# Patient Record
Sex: Female | Born: 1955 | Race: Black or African American | Hispanic: No | Marital: Married | Smoking: Never smoker
Health system: Southern US, Community
[De-identification: ages and names within clinical notes are randomized; demographics above are authoritative.]

## PROBLEM LIST (undated history)

## (undated) DIAGNOSIS — F3289 Other specified depressive episodes: Secondary | ICD-10-CM

## (undated) DIAGNOSIS — G43009 Migraine without aura, not intractable, without status migrainosus: Secondary | ICD-10-CM

## (undated) DIAGNOSIS — T7840XA Allergy, unspecified, initial encounter: Secondary | ICD-10-CM

## (undated) DIAGNOSIS — E785 Hyperlipidemia, unspecified: Secondary | ICD-10-CM

## (undated) DIAGNOSIS — D509 Iron deficiency anemia, unspecified: Secondary | ICD-10-CM

## (undated) DIAGNOSIS — I10 Essential (primary) hypertension: Secondary | ICD-10-CM

## (undated) DIAGNOSIS — F329 Major depressive disorder, single episode, unspecified: Secondary | ICD-10-CM

## (undated) DIAGNOSIS — K828 Other specified diseases of gallbladder: Secondary | ICD-10-CM

## (undated) DIAGNOSIS — E119 Type 2 diabetes mellitus without complications: Secondary | ICD-10-CM

## (undated) DIAGNOSIS — G2581 Restless legs syndrome: Secondary | ICD-10-CM

## (undated) DIAGNOSIS — F411 Generalized anxiety disorder: Secondary | ICD-10-CM

## (undated) DIAGNOSIS — K219 Gastro-esophageal reflux disease without esophagitis: Secondary | ICD-10-CM

## (undated) DIAGNOSIS — D573 Sickle-cell trait: Secondary | ICD-10-CM

## (undated) HISTORY — PX: LUMBAR DISC SURGERY: SHX700

## (undated) HISTORY — DX: Allergy, unspecified, initial encounter: T78.40XA

## (undated) HISTORY — DX: Sickle-cell trait: D57.3

## (undated) HISTORY — PX: COLONOSCOPY: SHX174

## (undated) HISTORY — DX: Major depressive disorder, single episode, unspecified: F32.9

## (undated) HISTORY — DX: Type 2 diabetes mellitus without complications: E11.9

## (undated) HISTORY — DX: Hyperlipidemia, unspecified: E78.5

## (undated) HISTORY — DX: Migraine without aura, not intractable, without status migrainosus: G43.009

## (undated) HISTORY — DX: Other specified depressive episodes: F32.89

## (undated) HISTORY — DX: Gastro-esophageal reflux disease without esophagitis: K21.9

## (undated) HISTORY — DX: Restless legs syndrome: G25.81

## (undated) HISTORY — DX: Essential (primary) hypertension: I10

## (undated) HISTORY — DX: Other specified diseases of gallbladder: K82.8

## (undated) HISTORY — DX: Iron deficiency anemia, unspecified: D50.9

## (undated) HISTORY — DX: Generalized anxiety disorder: F41.1

---

## 1998-09-16 HISTORY — PX: ABDOMINAL HYSTERECTOMY: SHX81

## 2004-08-17 ENCOUNTER — Ambulatory Visit: Payer: Self-pay | Admitting: Internal Medicine

## 2004-09-13 ENCOUNTER — Ambulatory Visit: Payer: Self-pay | Admitting: Internal Medicine

## 2005-06-05 ENCOUNTER — Ambulatory Visit: Payer: Self-pay | Admitting: Internal Medicine

## 2005-07-09 ENCOUNTER — Ambulatory Visit: Payer: Self-pay | Admitting: Internal Medicine

## 2005-10-02 ENCOUNTER — Ambulatory Visit: Payer: Self-pay | Admitting: Internal Medicine

## 2006-03-07 ENCOUNTER — Ambulatory Visit: Payer: Self-pay | Admitting: Internal Medicine

## 2006-03-27 ENCOUNTER — Ambulatory Visit: Payer: Self-pay | Admitting: Internal Medicine

## 2006-04-21 ENCOUNTER — Ambulatory Visit: Payer: Self-pay | Admitting: Internal Medicine

## 2006-05-09 ENCOUNTER — Ambulatory Visit: Payer: Self-pay | Admitting: Internal Medicine

## 2006-06-10 ENCOUNTER — Ambulatory Visit: Payer: Self-pay | Admitting: Internal Medicine

## 2007-03-11 ENCOUNTER — Ambulatory Visit: Payer: Self-pay | Admitting: Internal Medicine

## 2007-03-12 ENCOUNTER — Ambulatory Visit: Payer: Self-pay | Admitting: Internal Medicine

## 2007-03-12 LAB — CONVERTED CEMR LAB
AST: 15 units/L (ref 0–37)
Alkaline Phosphatase: 73 units/L (ref 39–117)
BUN: 12 mg/dL (ref 6–23)
Basophils Relative: 1 % (ref 0.0–1.0)
CO2: 30 meq/L (ref 19–32)
Chloride: 106 meq/L (ref 96–112)
Eosinophils Relative: 2.5 % (ref 0.0–5.0)
GFR calc non Af Amer: 112 mL/min
Glucose, Bld: 179 mg/dL — ABNORMAL HIGH (ref 70–99)
HCT: 35.5 % — ABNORMAL LOW (ref 36.0–46.0)
MCV: 82.5 fL (ref 78.0–100.0)
Microalb Creat Ratio: 2.4 mg/g (ref 0.0–30.0)
Monocytes Absolute: 0.5 10*3/uL (ref 0.2–0.7)
Monocytes Relative: 6.3 % (ref 3.0–11.0)
Neutro Abs: 4.9 10*3/uL (ref 1.4–7.7)
Neutrophils Relative %: 64.8 % (ref 43.0–77.0)
Potassium: 3.7 meq/L (ref 3.5–5.1)
RBC: 4.31 M/uL (ref 3.87–5.11)
RDW: 13.1 % (ref 11.5–14.6)
Total CHOL/HDL Ratio: 3.5

## 2007-05-01 ENCOUNTER — Ambulatory Visit: Payer: Self-pay | Admitting: Endocrinology

## 2007-10-29 ENCOUNTER — Ambulatory Visit: Payer: Self-pay | Admitting: Internal Medicine

## 2007-10-29 DIAGNOSIS — I10 Essential (primary) hypertension: Secondary | ICD-10-CM | POA: Insufficient documentation

## 2007-10-29 DIAGNOSIS — E785 Hyperlipidemia, unspecified: Secondary | ICD-10-CM | POA: Insufficient documentation

## 2007-10-29 DIAGNOSIS — E119 Type 2 diabetes mellitus without complications: Secondary | ICD-10-CM | POA: Insufficient documentation

## 2007-10-29 DIAGNOSIS — G43009 Migraine without aura, not intractable, without status migrainosus: Secondary | ICD-10-CM | POA: Insufficient documentation

## 2007-10-29 DIAGNOSIS — J069 Acute upper respiratory infection, unspecified: Secondary | ICD-10-CM | POA: Insufficient documentation

## 2007-10-29 DIAGNOSIS — M549 Dorsalgia, unspecified: Secondary | ICD-10-CM | POA: Insufficient documentation

## 2007-10-29 DIAGNOSIS — F411 Generalized anxiety disorder: Secondary | ICD-10-CM

## 2007-10-29 DIAGNOSIS — G43909 Migraine, unspecified, not intractable, without status migrainosus: Secondary | ICD-10-CM | POA: Insufficient documentation

## 2007-10-29 HISTORY — DX: Hyperlipidemia, unspecified: E78.5

## 2007-10-29 HISTORY — DX: Essential (primary) hypertension: I10

## 2007-10-29 HISTORY — DX: Migraine, unspecified, not intractable, without status migrainosus: G43.909

## 2007-10-29 HISTORY — DX: Generalized anxiety disorder: F41.1

## 2007-10-30 LAB — CONVERTED CEMR LAB
BUN: 10 mg/dL (ref 6–23)
Chloride: 103 meq/L (ref 96–112)
Cholesterol: 150 mg/dL (ref 0–200)
GFR calc Af Amer: 113 mL/min
GFR calc non Af Amer: 94 mL/min
HDL: 50.7 mg/dL (ref >39.0)
Hgb A1c MFr Bld: 8 % — ABNORMAL HIGH (ref 4.6–6.0)
Sodium: 140 meq/L (ref 135–145)

## 2008-02-16 ENCOUNTER — Ambulatory Visit: Payer: Self-pay | Admitting: Internal Medicine

## 2008-02-17 LAB — CONVERTED CEMR LAB
Albumin: 3.7 g/dL (ref 3.5–5.2)
BUN: 15 mg/dL (ref 6–23)
Basophils Absolute: 0 10*3/uL (ref 0.0–0.1)
Bilirubin Urine: NEGATIVE
CO2: 30 meq/L (ref 19–32)
Chloride: 103 meq/L (ref 96–112)
Creatinine, Ser: 0.8 mg/dL (ref 0.4–1.2)
Crystals: NEGATIVE
Eosinophils Absolute: 0.3 10*3/uL (ref 0.0–0.7)
Eosinophils Relative: 3.7 % (ref 0.0–5.0)
Folate: 15.3 ng/mL
GFR calc Af Amer: 97 mL/min
HCT: 35 % — ABNORMAL LOW (ref 36.0–46.0)
Hgb A1c MFr Bld: 6.3 % — ABNORMAL HIGH (ref 4.6–6.0)
Ketones, ur: NEGATIVE mg/dL
LDL Cholesterol: 75 mg/dL (ref 0–99)
Lymphocytes Relative: 31.7 % (ref 12.0–46.0)
Monocytes Relative: 7.3 % (ref 3.0–12.0)
Mucus, UA: NEGATIVE
Neutrophils Relative %: 56.6 % (ref 43.0–77.0)
Platelets: 326 10*3/uL (ref 150–400)
Saturation Ratios: 24.5 % (ref 20.0–50.0)
Specific Gravity, Urine: 1.025 (ref 1.000–1.03)
Total Protein, Urine: NEGATIVE mg/dL
Total Protein: 6.9 g/dL (ref 6.0–8.3)
Transferrin: 285.6 mg/dL (ref >=212.0)
Triglycerides: 53 mg/dL (ref 0–149)
Urine Glucose: NEGATIVE mg/dL
Urobilinogen, UA: 0.2 (ref 0.0–1.0)
VLDL: 11 mg/dL (ref 0–40)
Vitamin B-12: 284 pg/mL (ref 211–911)

## 2008-02-23 ENCOUNTER — Ambulatory Visit: Payer: Self-pay | Admitting: Internal Medicine

## 2008-04-08 ENCOUNTER — Encounter: Payer: Self-pay | Admitting: Internal Medicine

## 2008-04-11 ENCOUNTER — Telehealth: Payer: Self-pay | Admitting: Internal Medicine

## 2008-09-22 ENCOUNTER — Encounter: Payer: Self-pay | Admitting: Internal Medicine

## 2008-11-17 ENCOUNTER — Ambulatory Visit: Payer: Self-pay | Admitting: Internal Medicine

## 2008-11-17 DIAGNOSIS — R109 Unspecified abdominal pain: Secondary | ICD-10-CM | POA: Insufficient documentation

## 2008-11-17 DIAGNOSIS — J019 Acute sinusitis, unspecified: Secondary | ICD-10-CM | POA: Insufficient documentation

## 2008-11-17 LAB — CONVERTED CEMR LAB
BUN: 13 mg/dL (ref 6–23)
CO2: 33 meq/L — ABNORMAL HIGH (ref 19–32)
Crystals: NEGATIVE
Glucose, Bld: 112 mg/dL — ABNORMAL HIGH (ref 70–99)
HDL: 66.5 mg/dL (ref >39.0)
Hemoglobin, Urine: NEGATIVE
Hgb A1c MFr Bld: 5.8 % (ref 4.6–6.0)
Ketones, ur: NEGATIVE mg/dL
LDL Cholesterol: 62 mg/dL (ref 0–99)
Potassium: 4 meq/L (ref 3.5–5.1)
Sodium: 142 meq/L (ref 135–145)
Specific Gravity, Urine: 1.015 (ref 1.000–1.035)
Total Protein, Urine: NEGATIVE mg/dL
Urine Glucose: NEGATIVE mg/dL
pH: 7.5 (ref 5.0–8.0)

## 2009-02-02 ENCOUNTER — Encounter: Payer: Self-pay | Admitting: Internal Medicine

## 2009-02-06 ENCOUNTER — Encounter: Payer: Self-pay | Admitting: Internal Medicine

## 2009-02-06 ENCOUNTER — Telehealth (INDEPENDENT_AMBULATORY_CARE_PROVIDER_SITE_OTHER): Payer: Self-pay | Admitting: *Deleted

## 2009-02-07 ENCOUNTER — Telehealth (INDEPENDENT_AMBULATORY_CARE_PROVIDER_SITE_OTHER): Payer: Self-pay | Admitting: *Deleted

## 2009-02-09 ENCOUNTER — Telehealth: Payer: Self-pay | Admitting: Internal Medicine

## 2009-02-14 ENCOUNTER — Encounter: Payer: Self-pay | Admitting: Internal Medicine

## 2009-05-01 ENCOUNTER — Telehealth: Payer: Self-pay | Admitting: Internal Medicine

## 2009-05-02 ENCOUNTER — Ambulatory Visit: Payer: Self-pay | Admitting: Internal Medicine

## 2009-05-02 DIAGNOSIS — H6690 Otitis media, unspecified, unspecified ear: Secondary | ICD-10-CM | POA: Insufficient documentation

## 2009-05-24 ENCOUNTER — Encounter: Payer: Self-pay | Admitting: Internal Medicine

## 2009-05-30 ENCOUNTER — Encounter: Payer: Self-pay | Admitting: Internal Medicine

## 2009-06-07 ENCOUNTER — Ambulatory Visit: Payer: Self-pay | Admitting: Internal Medicine

## 2009-06-07 DIAGNOSIS — R55 Syncope and collapse: Secondary | ICD-10-CM | POA: Insufficient documentation

## 2009-06-07 HISTORY — DX: Syncope and collapse: R55

## 2009-08-15 ENCOUNTER — Ambulatory Visit: Payer: Self-pay | Admitting: Cardiology

## 2009-08-15 ENCOUNTER — Ambulatory Visit: Payer: Self-pay

## 2009-08-15 ENCOUNTER — Encounter: Payer: Self-pay | Admitting: Internal Medicine

## 2009-08-15 ENCOUNTER — Ambulatory Visit (HOSPITAL_COMMUNITY): Admission: RE | Admit: 2009-08-15 | Discharge: 2009-08-15 | Payer: Self-pay | Admitting: Internal Medicine

## 2009-10-26 ENCOUNTER — Ambulatory Visit: Payer: Self-pay | Admitting: Internal Medicine

## 2009-10-26 LAB — CONVERTED CEMR LAB
AST: 16 units/L (ref 0–37)
Alkaline Phosphatase: 77 units/L (ref 39–117)
BUN: 11 mg/dL (ref 6–23)
Bilirubin, Direct: 0.1 mg/dL (ref 0.0–0.3)
CO2: 29 meq/L (ref 19–32)
Chloride: 106 meq/L (ref 96–112)
Creatinine,U: 99.6 mg/dL
Eosinophils Absolute: 0.3 10*3/uL (ref 0.0–0.7)
HDL: 50.8 mg/dL (ref >39.00)
Hemoglobin, Urine: NEGATIVE
LDL Cholesterol: 59 mg/dL (ref 0–99)
Lymphocytes Relative: 22.5 % (ref 12.0–46.0)
MCV: 83.1 fL (ref 78.0–100.0)
Microalb Creat Ratio: 11 mg/g (ref 0.0–30.0)
Microalb, Ur: 1.1 mg/dL (ref 0.0–1.9)
Monocytes Absolute: 0.4 10*3/uL (ref 0.1–1.0)
Neutrophils Relative %: 66.7 % (ref 43.0–77.0)
Potassium: 4.3 meq/L (ref 3.5–5.1)
RBC: 4.4 M/uL (ref 3.87–5.11)
RDW: 13.8 % (ref 11.5–14.6)
Specific Gravity, Urine: 1.02 (ref 1.000–1.030)
TSH: 1.12 microintl units/mL (ref 0.35–5.50)
Total CHOL/HDL Ratio: 2
Total Protein: 7.4 g/dL (ref 6.0–8.3)
WBC: 8.2 10*3/uL (ref 4.5–10.5)

## 2009-11-02 ENCOUNTER — Ambulatory Visit: Payer: Self-pay | Admitting: Internal Medicine

## 2009-11-07 ENCOUNTER — Telehealth: Payer: Self-pay | Admitting: Internal Medicine

## 2009-11-07 ENCOUNTER — Encounter: Payer: Self-pay | Admitting: Internal Medicine

## 2010-02-21 ENCOUNTER — Ambulatory Visit: Payer: Self-pay | Admitting: Endocrinology

## 2010-02-26 ENCOUNTER — Telehealth: Payer: Self-pay | Admitting: Internal Medicine

## 2010-03-12 ENCOUNTER — Telehealth: Payer: Self-pay | Admitting: Internal Medicine

## 2010-03-26 ENCOUNTER — Ambulatory Visit: Payer: Self-pay | Admitting: Internal Medicine

## 2010-03-26 DIAGNOSIS — F329 Major depressive disorder, single episode, unspecified: Secondary | ICD-10-CM

## 2010-03-26 DIAGNOSIS — R519 Headache, unspecified: Secondary | ICD-10-CM

## 2010-03-26 DIAGNOSIS — F32A Depression, unspecified: Secondary | ICD-10-CM | POA: Insufficient documentation

## 2010-03-26 DIAGNOSIS — R51 Headache: Secondary | ICD-10-CM | POA: Insufficient documentation

## 2010-03-26 HISTORY — DX: Headache, unspecified: R51.9

## 2010-03-26 HISTORY — DX: Major depressive disorder, single episode, unspecified: F32.9

## 2010-04-03 ENCOUNTER — Ambulatory Visit (HOSPITAL_COMMUNITY): Admission: RE | Admit: 2010-04-03 | Discharge: 2010-04-03 | Payer: Self-pay | Admitting: Internal Medicine

## 2010-04-30 ENCOUNTER — Telehealth: Payer: Self-pay | Admitting: Internal Medicine

## 2010-05-28 ENCOUNTER — Ambulatory Visit: Payer: Self-pay | Admitting: Internal Medicine

## 2010-05-28 LAB — CONVERTED CEMR LAB
BUN: 14 mg/dL (ref 6–23)
Calcium: 9.4 mg/dL (ref 8.4–10.5)
Chloride: 106 meq/L (ref 96–112)
Cholesterol: 130 mg/dL (ref 0–200)
Creatinine, Ser: 0.6 mg/dL (ref 0.4–1.2)
Hgb A1c MFr Bld: 6.9 % — ABNORMAL HIGH (ref 4.6–6.5)

## 2010-07-24 ENCOUNTER — Telehealth: Payer: Self-pay | Admitting: Internal Medicine

## 2010-07-26 ENCOUNTER — Encounter (INDEPENDENT_AMBULATORY_CARE_PROVIDER_SITE_OTHER): Payer: Self-pay | Admitting: *Deleted

## 2010-07-26 ENCOUNTER — Ambulatory Visit: Payer: Self-pay | Admitting: Internal Medicine

## 2010-07-26 DIAGNOSIS — R300 Dysuria: Secondary | ICD-10-CM | POA: Insufficient documentation

## 2010-07-26 LAB — CONVERTED CEMR LAB
Bilirubin Urine: NEGATIVE
Ketones, urine, test strip: NEGATIVE
Protein, U semiquant: NEGATIVE

## 2010-07-27 ENCOUNTER — Encounter: Payer: Self-pay | Admitting: Internal Medicine

## 2010-10-16 NOTE — Assessment & Plan Note (Signed)
Summary: NOT FEELING HERSELF/ BP IS OK/ DIDN'T ELABORATE/NWS   Vital Signs:  Patient profile:   55 year old female Height:      62 inches Weight:      136.75 pounds BMI:     25.10 O2 Sat:      97 % on Room air Temp:     98.6 degrees F oral Pulse rate:   77 / minute BP sitting:   122 / 84  (left arm) Cuff size:   regular  Vitals Entered By: Zella Ball Ewing CMA (AAMA) (March 26, 2010 10:20 AM)  O2 Flow:  Room air CC: Memory and anxiety problems/RE   CC:  Memory and anxiety problems/RE.  History of Present Illness: here for f/u;  husband and job are stable, but other family issues and expensive car repairs, finnancial problem and gas costs ead to increased stress recently;  Pt denies CP, sob, doe, wheezing, orthopnea, pnd, worsening LE edema, palps, dizziness or syncope Pt denies new neuro symptoms such as headache, facial or extremity weakness  Friend at work was "moody" last wk and not talking to her and not sure why..  Has had significant fatigue despite recent vacation, with depressive symptoms for over a month, but no sucidal ideaiton.  has had several episodes ? panic with chest tightness and sob.  Sugars have been over 150 to 200's recently and a1c 7.9. Pt denies polydipsia, polyuria, or low sugar symptoms such as shakiness improved with eating.  Overall good compliance with meds, trying to follow low chol, DM diet, wt stable, little excercise however   Had eye exam in jan 2011 - neg; now with 2 mo icepick pains behind the right eye;  con'td to take the bayer asa, but still with signiifcant pain, random, intermittent, mod, Pt denies new neuro symptoms such as facial or extremity weakness  No blurred vision, dizziness,  fever, sinus symptoms, ST, cough.    Problems Prior to Update: 1)  Depression  (ICD-311) 2)  Headache  (ICD-784.0) 3)  Syncope  (ICD-780.2) 4)  Otitis Media, Acute, Right  (ICD-382.9) 5)  Abdominal Pain, Lower  (ICD-789.09) 6)  Sinusitis- Acute-nos  (ICD-461.9) 7)   Preventive Health Care  (ICD-V70.0) 8)  Anxiety  (ICD-300.00) 9)  Back Pain  (ICD-724.5) 10)  Uri  (ICD-465.9) 11)  Common Migraine  (ICD-346.10) 12)  Family History of Alcoholism/addiction  (ICD-V61.41) 13)  Hypertension  (ICD-401.9) 14)  Hyperlipidemia  (ICD-272.4) 15)  Diabetes Mellitus, Type II  (ICD-250.00)  Medications Prior to Update: 1)  Lisinopril 10 Mg Tabs (Lisinopril) .Marland Kitchen.. 1 By Mouth Once Daily 2)  Pravastatin Sodium 40 Mg Tabs (Pravastatin Sodium) .... Take 1 Tablet By Mouth Once A Day 3)  Ecotrin Low Strength 81 Mg  Tbec (Aspirin) .Marland Kitchen.. 1 By Mouth Qd 4)  Freestyle Lite Test   Strp (Glucose Blood) .... Use Asd Once Daily 5)  Freestyle Lancets   Misc (Lancets) .... Use Asd 1 Once Daily 6)  Metformin Hcl 500 Mg Xr24h-Tab (Metformin Hcl) .... 2 By Mouth Two Times A Day 7)  Fluticasone Propionate 0.05 % Crea (Fluticasone Propionate) .... To Rash Three Times A Day 8)  Prednisone 10 Mg Tabs (Prednisone) .... 3 Once Daily X 3 Days, 2 Once Daily X 3 Days, Then 1 Once Daily X 6 Days  Current Medications (verified): 1)  Lisinopril 10 Mg Tabs (Lisinopril) .Marland Kitchen.. 1 By Mouth Once Daily 2)  Pravastatin Sodium 40 Mg Tabs (Pravastatin Sodium) .... Take 1 Tablet By Mouth Once  A Day 3)  Ecotrin Low Strength 81 Mg  Tbec (Aspirin) .Marland Kitchen.. 1 By Mouth Qd 4)  Freestyle Lite Test   Strp (Glucose Blood) .... Use Asd Once Daily 5)  Freestyle Lancets   Misc (Lancets) .... Use Asd 1 Once Daily 6)  Metformin Hcl 500 Mg Xr24h-Tab (Metformin Hcl) .... 2 By Mouth Two Times A Day 7)  Fluticasone Propionate 0.05 % Crea (Fluticasone Propionate) .... To Rash Three Times A Day 8)  Sumatriptan Succinate 100 Mg Tabs (Sumatriptan Succinate) .Marland Kitchen.. 1 By Mouth Every Other Day As Needed 9)  Januvia 50 Mg Tabs (Sitagliptin Phosphate) .Marland Kitchen.. 1 By Mouth Once Daily 10)  Fluoxetine Hcl 20 Mg Caps (Fluoxetine Hcl) .Marland Kitchen.. 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   Head:  normocephalic and atraumatic.   Eyes:  vision grossly intact, pupils equal, and pupils round.   Ears:  R ear normal and L ear normal.   Nose:  no external deformity and no nasal discharge.   Mouth:  no gingival abnormalities and pharynx pink and moist.   Neck:  supple and no masses.   Lungs:  normal respiratory effort and normal breath sounds.   Heart:  normal rate and regular rhythm.   Extremities:  no edema, no erythema  Neurologic:  cranial nerves II-XII intact.  , fundi benign bilat Psych:  dysphoric affect and moderately anxious.     Impression & Recommendations:  Problem # 1:  ANXIETY (ICD-300.00)  Her updated medication list for this problem includes:    Fluoxetine Hcl 20 Mg Caps (Fluoxetine hcl) .Marland Kitchen... 1po once daily treat as above, f/u any worsening signs or symptoms   Problem # 2:  DEPRESSION (ICD-311)  start prozac 20 mg trial  Her updated medication list for this problem includes:    Fluoxetine Hcl 20 Mg Caps (Fluoxetine hcl) .Marland Kitchen... 1po once daily  Problem # 3:  HEADACHE (ICD-784.0)  Her updated medication list for this problem includes:    Ecotrin Low Strength 81 Mg Tbec (Aspirin) .Marland Kitchen... 1 by mouth qd    Sumatriptan Succinate 100 Mg Tabs (Sumatriptan succinate) .Marland Kitchen... 1 by mouth every other day as needed  ? migraine vs cluster vs other - for head MRI , and trial imitrex as needed   Orders: Radiology Referral (Radiology)  Problem # 4:  DIABETES  MELLITUS, TYPE II (ICD-250.00)  Her updated medication list for this problem includes:    Lisinopril 10 Mg Tabs (Lisinopril) .Marland Kitchen... 1 by mouth once daily    Ecotrin Low Strength 81 Mg Tbec (Aspirin) .Marland Kitchen... 1 by mouth qd    Metformin Hcl 500 Mg Xr24h-tab (Metformin hcl) .Marland Kitchen... 2 by mouth two times a day    Januvia 50 Mg Tabs (Sitagliptin phosphate) .Marland Kitchen... 1 by mouth once daily  Labs Reviewed: Creat: 0.6 (10/26/2009)    Reviewed HgBA1c results: 7.9 (02/21/2010)  6.4 (10/26/2009) to add the januvia as above  Complete Medication List: 1)  Lisinopril 10 Mg Tabs (Lisinopril) .Marland KitchenMarland KitchenMarland Kitchen 1  by mouth once daily 2)  Pravastatin Sodium 40 Mg Tabs (Pravastatin sodium) .... Take 1 tablet by mouth once a day 3)  Ecotrin Low Strength 81 Mg Tbec (Aspirin) .Marland Kitchen.. 1 by mouth qd 4)  Freestyle Lite Test Strp (Glucose blood) .... Use asd once daily 5)  Freestyle Lancets Misc (Lancets) .... Use asd 1 once daily 6)  Metformin Hcl 500 Mg Xr24h-tab (Metformin hcl) .... 2 by mouth two times a day 7)  Fluticasone Propionate 0.05 % Crea (Fluticasone propionate) .... To rash three times a day 8)  Sumatriptan Succinate 100 Mg Tabs (Sumatriptan succinate) .Marland Kitchen.. 1 by mouth every other day as needed 9)  Januvia 50 Mg Tabs (Sitagliptin phosphate) .Marland Kitchen.. 1 by mouth once daily 10)  Fluoxetine Hcl 20 Mg Caps (Fluoxetine hcl) .Marland Kitchen.. 1po once daily  Patient Instructions: 1)  start the generic prozac (fluoxetine) 20 mg - 1 per day for nerves 2)  start the generic imitrex (sumatriptan) 100 mg as needed for headache 3)  start the Januvia 50 mg - 1 per day - for Diabetes (and call the number on the card to activate the card to get med for less cost) 4)  Continue all previous medications as before this visit  5)  Please call if the headaches persist or worsen for possible referral to [facility] 6)  Please schedule a follow-up appointment in 2 months with: 7)  BMP prior to visit, ICD-9: 250.02 8)  Lipid Panel prior to visit,  ICD-9: 9)  HbgA1C prior to visit, ICD-9: Prescriptions: FLUOXETINE HCL 20 MG CAPS (FLUOXETINE HCL) 1po once daily  #90 x 3   Entered and Authorized by:   Corwin Levins MD   Signed by:   Corwin Levins MD on 03/26/2010   Method used:   Print then Give to Patient   RxID:   [id number] JANUVIA 50 MG TABS (SITAGLIPTIN PHOSPHATE) 1 by mouth once daily  #90 x 3   Entered and Authorized by:   Corwin Levins MD   Signed by:   Corwin Levins MD on 03/26/2010   Method used:   Print then Give to Patient   RxID:   [id number] SUMATRIPTAN SUCCINATE 100 MG TABS (SUMATRIPTAN SUCCINATE) 1 by mouth every other day as needed  #9 x 11   Entered and Authorized by:   Corwin Levins MD   Signed by:   Corwin Levins MD on 03/26/2010   Method used:   Print then Give to Patient   RxID:   [id number]

## 2010-10-16 NOTE — Progress Notes (Signed)
Summary: nausea  Phone Note Call from Patient Call back at Home Phone [phone]   Caller: Patient Summary of Call: pt called stating that ABX is causing nausea. pt has 2 days left of treatment and would like to continue but is requesting something for the nausea.  Initial call taken by: Margaret Pyle, CMA,  November 07, 2009 8:36 AM  Follow-up for Phone Call        phenergan 25 mg by mouth q4h as needed - disp #20, no refills - thanks Follow-up by: Newt Lukes MD,  November 07, 2009 8:51 AM  Additional Follow-up for Phone Call Additional follow up Details #1::        pt informed Additional Follow-up by: Margaret Pyle, CMA,  November 07, 2009 9:01 AM    New/Updated Medications: PROMETHAZINE HCL 25 MG TABS (PROMETHAZINE HCL) 1 by mouth q4h as needed Prescriptions: PROMETHAZINE HCL 25 MG TABS (PROMETHAZINE HCL) 1 by mouth q4h as needed  #20 x 0   Entered by:   Margaret Pyle, CMA   Authorized by:   Newt Lukes MD   Signed by:   Margaret Pyle, CMA on 11/07/2009   Method used:   Electronically to        [facility] [phone]* (retail)       [address] [place], [place]  [address]       Ph: [phone]       Fax: [phone]   RxID:   [id number]

## 2010-10-16 NOTE — Progress Notes (Signed)
Summary: Yeast Infection  Phone Note Call from Patient Call back at Home Phone [phone]   Caller: Patient Summary of Call: Pt called stating that she has a yeast infection. Pt is requesting Rx for Diflucan to pharmacy Initial call taken by: Margaret Pyle, CMA,  July 24, 2010 2:00 PM  Follow-up for Phone Call        ok for diflucan - done per emr Follow-up by: Corwin Levins MD,  July 24, 2010 3:23 PM  Additional Follow-up for Phone Call Additional follow up Details #1::        Pt informed Additional Follow-up by: Margaret Pyle, CMA,  July 24, 2010 3:31 PM    New/Updated Medications: FLUCONAZOLE 150 MG TABS (FLUCONAZOLE) 1po q 3 days as needed Prescriptions: FLUCONAZOLE 150 MG TABS (FLUCONAZOLE) 1po q 3 days as needed  #2 x 1   Entered and Authorized by:   Corwin Levins MD   Signed by:   Corwin Levins MD on 07/24/2010   Method used:   Electronically to        [facility] [phone]* (retail)       [address] [place], [place]  [address]       Ph: [phone]       Fax: [phone]   RxID:   [id number]

## 2010-10-16 NOTE — Assessment & Plan Note (Signed)
Summary: UTI /NWS   Vital Signs:  Patient profile:   55 year old female Height:      62.5 inches Weight:      131.38 pounds BMI:     23.73 O2 Sat:      97 % on Room air Temp:     98.5 degrees F oral Pulse rate:   76 / minute BP sitting:   122 / 80  (left arm) Cuff size:   regular  Vitals Entered By: Zella Ball Ewing CMA Duncan Dull) (July 26, 2010 3:58 PM)  O2 Flow:  Room air CC: UTI/RE   CC:  UTI/RE.  History of Present Illness: here with acute onset 1-2 days dysuria with freq and cloudy urine, lower abd discomfort ;  no high fevers, chills, back or flank pain, dizziness, confusion.  Pt denies CP, worsening sob, doe, wheezing, orthopnea, pnd, worsening LE edema, palps, dizziness or syncope  Pt denies new neuro symptoms such as headache, facial or extremity weakness Pt denies polydipsia, polyuria, or low sugar symptoms such as shakiness improved with eating.  Overall good compliance with meds, trying to follow low chol, DM diet, wt stable, little excercise however   Problems Prior to Update: 1)  Dysuria  (ICD-788.1) 2)  Depression  (ICD-311) 3)  Headache  (ICD-784.0) 4)  Syncope  (ICD-780.2) 5)  Otitis Media, Acute, Right  (ICD-382.9) 6)  Abdominal Pain, Lower  (ICD-789.09) 7)  Sinusitis- Acute-nos  (ICD-461.9) 8)  Preventive Health Care  (ICD-V70.0) 9)  Anxiety  (ICD-300.00) 10)  Back Pain  (ICD-724.5) 11)  Uri  (ICD-465.9) 12)  Common Migraine  (ICD-346.10) 13)  Family History of Alcoholism/addiction  (ICD-V61.41) 14)  Hypertension  (ICD-401.9) 15)  Hyperlipidemia  (ICD-272.4) 16)  Diabetes Mellitus, Type II  (ICD-250.00)  Medications Prior to Update: 1)  Lisinopril 10 Mg Tabs (Lisinopril) .Marland Kitchen.. 1 By Mouth Once Daily 2)  Pravastatin Sodium 40 Mg Tabs (Pravastatin Sodium) .... Take 1 Tablet By Mouth Once A Day 3)  Ecotrin Low Strength 81 Mg  Tbec (Aspirin) .Marland Kitchen.. 1 By Mouth Qd 4)  Freestyle Lite Test   Strp (Glucose Blood) .... Use Asd Once Daily 5)  Freestyle Lancets   Misc  (Lancets) .... Use Asd 1 Once Daily 6)  Metformin Hcl 500 Mg Xr24h-Tab (Metformin Hcl) .... 2 By Mouth Two Times A Day 7)  Fluticasone Propionate 0.05 % Crea (Fluticasone Propionate) .... To Rash Three Times A Day 8)  Sumatriptan Succinate 100 Mg Tabs (Sumatriptan Succinate) .Marland Kitchen.. 1 By Mouth Every Other Day As Needed 9)  Onglyza 5 Mg Tabs (Saxagliptin Hcl) .Marland Kitchen.. 1 By Mouth Once Daily 10)  Fluoxetine Hcl 20 Mg Caps (Fluoxetine Hcl) .Marland Kitchen.. 1po Once Daily 11)  Fluconazole 150 Mg Tabs (Fluconazole) .Marland Kitchen.. 1po Q 3 Days As Needed  Current Medications (verified): 1)  Lisinopril 10 Mg Tabs (Lisinopril) .Marland Kitchen.. 1 By Mouth Once Daily 2)  Pravastatin Sodium 40 Mg Tabs (Pravastatin Sodium) .... Take 1 Tablet By Mouth Once A Day 3)  Ecotrin Low Strength 81 Mg  Tbec (Aspirin) .Marland Kitchen.. 1 By Mouth Qd 4)  Freestyle Lite Test   Strp (Glucose Blood) .... Use Asd Once Daily 5)  Freestyle Lancets   Misc (Lancets) .... Use Asd 1 Once Daily 6)  Metformin Hcl 500 Mg Xr24h-Tab (Metformin Hcl) .... 2 By Mouth Two Times A Day 7)  Sumatriptan Succinate 100 Mg Tabs (Sumatriptan Succinate) .Marland Kitchen.. 1 By Mouth Every Other Day As Needed 8)  Onglyza 5 Mg Tabs (Saxagliptin Hcl) .Marland KitchenMarland KitchenMarland Kitchen 1  By Mouth Once Daily 9)  Fluoxetine Hcl 20 Mg Caps (Fluoxetine Hcl) .Marland Kitchen.. 1po Once Daily 10)  Fluconazole 150 Mg Tabs (Fluconazole) .Marland Kitchen.. 1po Q 3 Days As Needed 11)  Levaquin 500 Mg Tabs (Levofloxacin) .Marland Kitchen.. 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  , mild ill  Head:  normocephalic and atraumatic.   Eyes:  vision grossly intact, pupils equal, and pupils round.   Ears:  R ear normal and L ear normal.   Nose:  no external deformity and no nasal discharge.   Mouth:  no gingival abnormalities and pharynx pink and moist.   Neck:  supple and no masses.   Lungs:  normal respiratory effort and normal breath sounds.   Heart:  normal rate and regular rhythm.   Abdomen:  soft and normal bowel sounds.  with mild low mid abd tender, wihtout guarding or rebound Extremities:  no edema, no erythema    Impression & Recommendations:  Problem # 1:  DYSURIA (ICD-788.1)  Orders: UA Dipstick W/ Micro (manual) (84132) T-Culture, Urine ([id number])  Her updated medication list for this problem includes:    Levaquin 500 Mg Tabs (Levofloxacin) .Marland Kitchen... 1po once daily prob uti/cystitis - for urine studies, treat as above, f/u any worsening signs or symptoms   Problem # 2:  HYPERTENSION (ICD-401.9)  Her updated medication list for this problem includes:    Lisinopril 10 Mg Tabs (Lisinopril) .Marland Kitchen... 1 by mouth once daily  BP today: 122/80 Prior BP: 122/84 (03/26/2010)  Labs Reviewed: K+: 4.6 (05/28/2010) Creat: : 0.6 (05/28/2010)   Chol: 130 (05/28/2010)   HDL: 51.00 (05/28/2010)   LDL: 67 (05/28/2010)   TG: 62.0 (05/28/2010) stable overall by hx and exam, ok to continue meds/tx as is   Problem # 3:  DIABETES MELLITUS, TYPE II (ICD-250.00)  Her updated medication list for this problem includes:    Lisinopril 10 Mg Tabs (Lisinopril) .Marland Kitchen... 1 by mouth once daily    Ecotrin Low Strength 81 Mg Tbec (Aspirin) .Marland Kitchen... 1 by mouth qd    Metformin Hcl 500 Mg Xr24h-tab (Metformin hcl) .Marland Kitchen... 2 by mouth two times a day    Onglyza 5 Mg Tabs (Saxagliptin hcl) .Marland Kitchen... 1 by mouth once daily  Labs Reviewed: Creat: 0.6 (05/28/2010)    Reviewed HgBA1c results: 6.9 (05/28/2010)   7.9 (02/21/2010) stable overall by hx and exam, ok to continue meds/tx as is   Problem # 4:  HYPERLIPIDEMIA (ICD-272.4)  Her updated medication list for this problem includes:    Pravastatin Sodium 40 Mg Tabs (Pravastatin sodium) .Marland Kitchen... Take 1 tablet by mouth once a day  Labs Reviewed: SGOT: 16 (10/26/2009)   SGPT: 15 (10/26/2009)   HDL:51.00 (05/28/2010), 50.80 (10/26/2009)  LDL:67 (05/28/2010), 59 (10/26/2009)  Chol:130 (05/28/2010), 124 (10/26/2009)  Trig:62.0 (05/28/2010), 72.0 (10/26/2009) stable overall by hx and exam, ok to continue meds/tx as is   Complete Medication List: 1)  Lisinopril 10 Mg Tabs (Lisinopril) .Marland Kitchen.. 1 by mouth once daily  2)  Pravastatin Sodium 40 Mg Tabs (Pravastatin sodium) .... Take 1 tablet by mouth once a day 3)  Ecotrin Low Strength 81 Mg Tbec (Aspirin) .Marland Kitchen.. 1 by mouth qd 4)  Freestyle Lite Test Strp (Glucose blood) .... Use asd once daily 5)  Freestyle Lancets Misc (Lancets) .... Use asd 1 once daily 6)  Metformin Hcl 500 Mg Xr24h-tab (Metformin hcl) .... 2 by mouth two times a day 7)  Sumatriptan Succinate 100 Mg Tabs (Sumatriptan succinate) .Marland Kitchen.. 1 by mouth every other day as needed 8)  Onglyza 5 Mg Tabs (Saxagliptin hcl) .Marland Kitchen.. 1 by mouth once daily 9)  Fluoxetine Hcl 20 Mg Caps (Fluoxetine hcl) .Marland Kitchen.. 1po once daily 10)  Fluconazole 150 Mg Tabs (Fluconazole) .Marland Kitchen.. 1po q 3 days as needed 11)  Levaquin 500 Mg Tabs (Levofloxacin) .Marland Kitchen.. 1po once daily  Patient Instructions: 1)  Please take all new medications as prescribed 2)  Continue all previous medications as before this visit  3)  Your urine will be sent for culture 4)  Please call the number on the [facility] Card for results of your testing  5)  Please schedule a follow-up appointment in 6 months for CPX with labs and:  6)  HbgA1C prior to visit, ICD-9: 250.02 7)  Urine Microalbumin prior to visit, ICD-9: Prescriptions: LEVAQUIN 500 MG TABS (LEVOFLOXACIN) 1po once daily  #7 x 0   Entered and Authorized by:    Corwin Levins MD   Signed by:   Corwin Levins MD on 07/26/2010   Method used:   Print then Give to Patient   RxID:   [id number]    Orders Added: 1)  UA Dipstick W/ Micro (manual) [81000] 2)  T-Culture, Urine [[id number]] 3)  Est. Patient Level IV [30865]    Laboratory Results   Urine Tests    Routine Urinalysis   Color: yellow Appearance: Cloudy Glucose: negative   (Normal Range: Negative) Bilirubin: negative   (Normal Range: Negative) Ketone: negative   (Normal Range: Negative) Spec. Gravity: 1.010   (Normal Range: 1.003-1.035) Blood: moderate   (Normal Range: Negative) pH: 7.0   (Normal Range: 5.0-8.0) Protein: negative   (Normal Range: Negative) Urobilinogen: 0.2   (Normal Range: 0-1) Nitrite: negative   (Normal Range: Negative) Leukocyte Esterace: small   (Normal Range: Negative)

## 2010-10-16 NOTE — Letter (Signed)
Summary: [facility]  [facility]   Imported By: Lester  11/13/2009 08:08:57  _____________________________________________________________________  External Attachment:    Type:   Image     Comment:   External Document

## 2010-10-16 NOTE — Assessment & Plan Note (Signed)
Summary: DR Sammuel Cooper PT/NO SLOT--RASH--STC   Vital Signs:  Patient profile:   55 year old female Height:      61 inches (154.94 cm) Weight:      138.0 pounds (62.73 kg) O2 Sat:      98 % on Room air Temp:     97.9 degrees F (36.61 degrees C) oral Pulse rate:   77 / minute BP sitting:   112 / 82  (left arm) Cuff size:   regular  Vitals Entered By: Orlan Leavens (February 21, 2010 8:01 AM)  O2 Flow:  Room air CC: POISON IVY Is Patient Diabetic? Yes Did you bring your meter with you today? Yes   CC:  POISON IVY.  History of Present Illness: pt states 10 days of slight itchy-quality rash which has spread from forearms to the trunk of the body.  she says this started after was doing yard work. no cbg record, but states cbg's are well-controlled.  Current Medications (verified): 1)  Lisinopril 10 Mg Tabs (Lisinopril) .Marland Kitchen.. 1 By Mouth Once Daily 2)  Pravastatin Sodium 40 Mg Tabs (Pravastatin Sodium) .... Take 1 Tablet By Mouth Once A Day 3)  Ecotrin Low Strength 81 Mg  Tbec (Aspirin) .Marland Kitchen.. 1 By Mouth Qd 4)  Freestyle Lite Test   Strp (Glucose Blood) .... Use Asd Once Daily 5)  Freestyle Lancets   Misc (Lancets) .... Use Asd 1 Once Daily 6)  Metformin Hcl 500 Mg Xr24h-Tab (Metformin Hcl) .... 2 By Mouth Once Daily  Allergies (verified): No Known Drug Allergies  Past History:  Past Medical History: Last updated: 10/29/2007 Diabetes mellitus, type II Hyperlipidemia Hypertension hx of situational depression migraine Anxiety  Review of Systems  The patient denies fever and dyspnea on exertion.    Physical Exam  General:  normal appearance.   Skin:  eczematous rash at the forearms, left thigh, and face. Additional Exam:  Hemoglobin A1C       [H]  7.9 %    Impression & Recommendations:  Problem # 1:  contact dermatitis new  Problem # 2:  DIABETES MELLITUS, TYPE II (ICD-250.00) Assessment: Deteriorated  Medications Added to Medication List This Visit: 1)  Metformin Hcl 500 Mg  Xr24h-tab (Metformin hcl) .... 2 by mouth two times a day 2)  Fluticasone Propionate 0.05 % Crea (Fluticasone propionate) .... To rash three times a day  Other Orders: TLB-A1C / Hgb A1C (Glycohemoglobin) (83036-A1C) Est. Patient Level IV (86578)  Patient Instructions: 1)  fluticasone cream three times a day as needed for itching. 2)  call few days if not better. 3)  blood tests are being ordered for you today.  please call [phone] to hear your test results. 4)  if your blood test is good, you can see dr Jonny Ruiz in october. 5)  (update: i left message on phone-tree:  increase metformin to 2x500 mg two times a day. see dr Jonny Ruiz in september) Prescriptions: METFORMIN HCL 500 MG XR24H-TAB (METFORMIN HCL) 2 by mouth two times a day  #120 x 11   Entered and Authorized by:   Minus Breeding MD   Signed by:   Minus Breeding MD on 02/21/2010   Method used:   Electronically to        [facility] [phone]* (retail)       [address] [place], [place]  [address]       Ph: [phone]  Fax: [phone]   RxID:   [id number] FLUTICASONE PROPIONATE 0.05 % CREA (FLUTICASONE PROPIONATE) to rash three times a day  #1 med tube x 1   Entered and Authorized by:   Minus Breeding MD   Signed by:   Minus Breeding MD on 02/21/2010   Method used:   Electronically to        [facility] [phone]* (retail)       [address]       [place], [place]  [address]       Ph: [phone]       Fax: [phone]   RxID:   [id number] FLUTICASONE PROPIONATE 0.05 % CREA (FLUTICASONE PROPIONATE) to rash three times a day  #1 med tube x 1   Entered and Authorized by:   Minus Breeding MD   Signed by:   Minus Breeding MD on 02/21/2010   Method used:   Electronically to        [facility] [phone]* (retail)       [address] [place], [place]  [address]       Ph: [phone]       Fax: [phone]   RxID:   [id number]

## 2010-10-16 NOTE — Letter (Signed)
Summary: Out of Work  [facility]  [address] [place], [place] [address]   Phone: [phone]  Fax: [phone]    July 26, 2010   Employee:  Nadyne Coombes [employer]    To Whom It May Concern:   For Medical reasons, please excuse the above named employee from work for the following dates:  Start:   07/26/2010  End:   07/26/2010  If you need additional information, please feel free to contact our office.         Sincerely,    Dr. Oliver Barre

## 2010-10-16 NOTE — Assessment & Plan Note (Signed)
Summary: CPX $50 /NWS   Vital Signs:  Patient profile:   55 year old female Height:      61 inches Weight:      140.13 pounds BMI:     26.57 O2 Sat:      97 % on Room air Temp:     98 degrees F oral Pulse rate:   78 / minute BP sitting:   130 / 88  (left arm) Cuff size:   regular  Vitals Entered ByZella Ball Ewing (November 02, 2009 1:12 PM)  O2 Flow:  Room air  CC: Adult physical/RE   CC:  Adult physical/RE.  History of Present Illness: lost 7 lbs since sept 2010;  better diet, cont to try to be active but limited due to bilat foot plantar fasciits, doing elliptical every other day and pilates and tai bo;  improved dizziness with reducing the glimepraride to 1 mg per day recnently , and only taking one per on the metformin.  Pt denies CP, sob, doe, wheezing, orthopnea, pnd, worsening LE edema, palps, dizziness or syncope  Pt denies new neuro symptoms such as headache, facial or extremity weakness  Pt denies polydipsia, polyuria, or low sugar symptoms such as shakiness improved with eating.  Overall good compliance with meds, trying to follow low chol, DM diet, wt stable, little excercise however    Has felt "off" with ST for 2 to 3 days with slight cough nonprod.    Problems Prior to Update: 1)  Pharyngitis-acute  (ICD-462) 2)  Syncope  (ICD-780.2) 3)  Otitis Media, Acute, Right  (ICD-382.9) 4)  Abdominal Pain, Lower  (ICD-789.09) 5)  Sinusitis- Acute-nos  (ICD-461.9) 6)  Preventive Health Care  (ICD-V70.0) 7)  Anxiety  (ICD-300.00) 8)  Back Pain  (ICD-724.5) 9)  Uri  (ICD-465.9) 10)  Common Migraine  (ICD-346.10) 11)  Family History of Alcoholism/addiction  (ICD-V61.41) 12)  Hypertension  (ICD-401.9) 13)  Hyperlipidemia  (ICD-272.4) 14)  Diabetes Mellitus, Type II  (ICD-250.00)  Medications Prior to Update: 1)  Lisinopril 10 Mg Tabs (Lisinopril) .Marland Kitchen.. 1 By Mouth Once Daily 2)  Pravastatin Sodium 40 Mg Tabs (Pravastatin Sodium) .... Take 1 Tablet By Mouth Once A Day 3)   Ecotrin Low Strength 81 Mg  Tbec (Aspirin) .Marland Kitchen.. 1 By Mouth Qd 4)  Freestyle Lite Test   Strp (Glucose Blood) .... Use Asd Once Daily 5)  Freestyle Lancets   Misc (Lancets) .... Use Asd 1 Once Daily 6)  Metformin Hcl 500 Mg Xr24h-Tab (Metformin Hcl) .... 4 By Mouth Once Daily 7)  Glimepiride 2 Mg Tabs (Glimepiride) .Marland Kitchen.. 1po Once Daily  Current Medications (verified): 1)  Lisinopril 10 Mg Tabs (Lisinopril) .Marland Kitchen.. 1 By Mouth Once Daily 2)  Pravastatin Sodium 40 Mg Tabs (Pravastatin Sodium) .... Take 1 Tablet By Mouth Once A Day 3)  Ecotrin Low Strength 81 Mg  Tbec (Aspirin) .Marland Kitchen.. 1 By Mouth Qd 4)  Freestyle Lite Test   Strp (Glucose Blood) .... Use Asd Once Daily 5)  Freestyle Lancets   Misc (Lancets) .... Use Asd 1 Once Daily 6)  Metformin Hcl 500 Mg Xr24h-Tab (Metformin Hcl) .... 2 By Mouth Once Daily 7)  Azithromycin 250 Mg Tabs (Azithromycin) .... 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         all otherwise negative per pt - except for decreased sex drive  Physical Exam  General:  alert and well-developed.   Head:  normocephalic and atraumatic.   Eyes:  vision grossly intact, pupils equal, and pupils round.   Ears:  R ear normal and L ear normal.   Nose:  no external deformity and no nasal discharge.   Mouth:  pharyngeal erythema and pharyngeal exudate.   Neck:  supple and no masses.   Lungs:  normal respiratory effort and normal breath sounds.   Heart:  normal rate and regular rhythm.   Abdomen:  soft, non-tender, and normal bowel sounds.   Msk:  no joint tenderness and no joint swelling.   Extremities:  no edema, no erythema  Neurologic:  cranial nerves II-XII intact and strength normal in all extremities.     Impression & Recommendations:  Problem # 1:  Preventive Health Care (ICD-V70.0)  Overall doing well, age appropriate education and counseling updated and referral for appropriate preventive services done unless declined, immunizations up to date or declined, diet counseling done if overweight, urged to quit smoking if smokes , most recent labs reviewed and current ordered if appropriate, ecg reviewed or declined (interpretation per ECG scanned in the EMR if done); information regarding Medicare Prevention requirements given if appropriate   Orders: EKG w/ Interpretation (93000)  Problem # 2:  DIABETES MELLITUS, TYPE II (ICD-250.00)  The following medications were removed from the medication list:    Glimepiride 2 Mg Tabs (Glimepiride) .Marland Kitchen... 1po once daily Her updated medication list for this problem includes:    Lisinopril 10 Mg Tabs (Lisinopril) .Marland Kitchen... 1 by mouth once daily    Ecotrin Low Strength 81 Mg Tbec (Aspirin) .Marland Kitchen... 1 by mouth qd    Metformin Hcl 500 Mg Xr24h-tab (Metformin hcl) .Marland Kitchen... 2 by mouth once  daily will adjust meds for simpler, less expensive and safer regimen with 2 metformin only, and no glimpeparide  Problem # 3:  PHARYNGITIS-ACUTE (ICD-462)  Her updated medication list for this problem includes:    Ecotrin Low Strength 81 Mg Tbec (Aspirin) .Marland Kitchen... 1 by mouth qd    Azithromycin 250 Mg Tabs (Azithromycin) .Marland Kitchen... 2po qd for 1 day, then 1po qd for 4days, then stop treat as above, f/u any worsening signs or symptoms   Problem # 4:  HYPERTENSION (ICD-401.9)  Her updated medication list for this problem includes:    Lisinopril 10 Mg Tabs (Lisinopril) .Marland Kitchen... 1 by mouth once daily  BP today: 130/88 Prior BP: 118/78 (06/07/2009)  Labs Reviewed: K+: 4.3 (10/26/2009) Creat: : 0.6 (10/26/2009)   Chol: 124 (10/26/2009)   HDL: 50.80 (10/26/2009)   LDL: 59 (10/26/2009)   TG: 72.0 (10/26/2009) stable overall by hx and exam, ok to continue meds/tx as is   Complete Medication List: 1)  Lisinopril 10 Mg Tabs (Lisinopril) .Marland Kitchen.. 1 by mouth once daily 2)  Pravastatin Sodium 40 Mg Tabs (Pravastatin sodium) .... Take 1 tablet by mouth once a day 3)  Ecotrin Low Strength 81 Mg Tbec (Aspirin) .Marland Kitchen.. 1 by mouth qd 4)  Freestyle Lite Test Strp (Glucose blood) .... Use asd once daily 5)  Freestyle Lancets Misc (Lancets) .... Use asd 1 once daily 6)  Metformin Hcl 500 Mg Xr24h-tab (Metformin hcl) .... 2 by mouth once daily 7)  Azithromycin 250 Mg Tabs (Azithromycin) .... 2po qd for 1 day, then 1po qd for 4days, then stop  Other Orders: Tdap => 29yrs IM (33295) Admin 1st Vaccine (18841) Pneumococcal Vaccine (66063) Admin of Any Addtl Vaccine (01601)  Patient Instructions: 1)  you had the tetanus and pneumonia shot 2)  Please take all new medications as prescribed - the antibiotic was sent to the pharmacy 3)  please call a GYn for yearly pap smear ((consider [facility],  and mammogram at [facility]) 4)  stop the glimeparide  5)  increase the metformin to two pills per day in the  morning 6)  your EKG was good today, and you are given the blood work results 7)  Please schedule a follow-up appointment in 6 months with : 8)  BMP prior to visit, ICD-9: 250.02 9)  Lipid Panel prior to visit, ICD-9: 10)  HbgA1C prior to visit, ICD-9: Prescriptions: AZITHROMYCIN 250 MG TABS (AZITHROMYCIN) 2po qd for 1 day, then 1po qd for 4days, then stop  #6 x 1   Entered and Authorized by:   Corwin Levins MD   Signed by:   Corwin Levins MD on 11/02/2009   Method used:   Electronically to        [facility] [phone]* (retail)       [address] [place], [place]  [address]       Ph: [phone]       Fax: [phone]   RxID:   [id number] FREESTYLE LANCETS   MISC (LANCETS) use asd 1 once daily  #100 x 3   Entered and Authorized by:   Corwin Levins MD   Signed by:   Corwin Levins MD on 11/02/2009   Method used:   Electronically to        [facility] [phone]* (retail)       [address] [place], [place]  [address]       Ph: [phone]       Fax: [phone]   RxID:   [id number] FREESTYLE LITE TEST   STRP (GLUCOSE BLOOD) use asd once daily  #100 x 11   Entered and Authorized by:   Corwin Levins MD   Signed by:   Corwin Levins MD on 11/02/2009   Method used:   Electronically to        [facility] [phone]* (retail)       [address] [place], [place]  [address]       Ph: [phone]       Fax: [phone]   RxID:   [id number] LISINOPRIL 10 MG TABS (LISINOPRIL) 1 by mouth once daily  #90 x 3   Entered and Authorized by:   Corwin Levins MD   Signed by:   Corwin Levins MD on 11/02/2009   Method used:  Electronically to        [facility] [phone]* (retail)       [address] [place], [place]  [address]       Ph: [phone]       Fax: [phone]   RxID:   [id number] PRAVASTATIN SODIUM 40 MG TABS (PRAVASTATIN SODIUM) Take 1 tablet by mouth once a day  #90 x 3   Entered and Authorized by:   Corwin Levins MD   Signed by:   Corwin Levins  MD on 11/02/2009   Method used:   Electronically to        [facility] [phone]* (retail)       [address] [place], [place]  [address]       Ph: [phone]       Fax: [phone]   RxID:   [id number] METFORMIN HCL 500 MG XR24H-TAB (METFORMIN HCL) 2 by mouth once daily  #180 x 3   Entered and Authorized by:   Corwin Levins MD   Signed by:   Corwin Levins MD on 11/02/2009   Method used:   Electronically to        [facility] [phone]* (retail)       [address] [place], [place]  [address]       Ph: [phone]       Fax: [phone]   RxID:   [id number]    Immunization History:  Influenza Immunization History:    Influenza:  historical (06/16/2009)  Immunizations Administered:  Tetanus Vaccine:    Vaccine Type: Tdap    Site: right deltoid    Mfr: GlaxoSmithKline    Dose: 0.5 ml    Route: IM    Given by: Robin Ewing    Exp. Date: 11/11/2011    Lot #: [id number]    VIS given: 08/04/07 version given November 02, 2009.  Pneumonia Vaccine:    Vaccine Type: Pneumovax    Site: left deltoid    Mfr: Merck    Dose: 0.5 ml    Route: IM    Given by: Robin Ewing    Exp. Date: 10/12/2010    Lot #: [id number]    VIS given: 04/13/96 version given November 02, 2009.

## 2010-10-16 NOTE — Progress Notes (Signed)
Summary: ALT med  Phone Note Call from Patient Call back at Home Phone [phone]   Caller: Patient Summary of Call: Pt called requesting cheaper alternative to Januvia Initial call taken by: Margaret Pyle, CMA,  April 30, 2010 4:06 PM  Follow-up for Phone Call        ok to change to onglyza - see also the "card" to use for money off at the pharmacy - to dahlia (see basket) Follow-up by: Corwin Levins MD,  April 30, 2010 5:05 PM  Additional Follow-up for Phone Call Additional follow up Details #1::        Pt informed, Rx and card in cabinet for pt pick up Additional Follow-up by: Margaret Pyle, CMA,  May 01, 2010 8:12 AM    New/Updated Medications: ONGLYZA 5 MG TABS (SAXAGLIPTIN HCL) 1 by mouth once daily Prescriptions: ONGLYZA 5 MG TABS (SAXAGLIPTIN HCL) 1 by mouth once daily  #90 x 3   Entered and Authorized by:   Corwin Levins MD   Signed by:   Corwin Levins MD on 04/30/2010   Method used:   Print then Give to Patient   RxID:   [id number]

## 2010-10-16 NOTE — Progress Notes (Signed)
Summary: Call Report  Phone Note Other Incoming   Caller: [facility] Call Report Summary of Call: [facility] Triage Call Report Triage Record Num: [id number] Operator: Caswell Corwin Patient Name: Ruth Crawford Call Date & Time: 03/10/2010 4:29:41PM Patient Phone: [phone] PCP: Oliver Barre Patient Gender: Female PCP Fax : [phone] Patient DOB: Jun 10, 1956 Practice Name: [facility] Reason for Call: Pt calling that her last BG was 389 @ 1530. She takes Metformin, 2 tabs BID and will take this evening. Has recently been on Prednisone for poison ivy. BG now is 360. Triaged hyperglycemia and she is very thirsty, and fatigued and is having trouble concentrating. Inst to go to [facility] or [facility] Protocol(s) Used: Diabetes: Out of Control Recommended Outcome per Protocol: See ED Immediately Reason for Outcome: Blood sugar more than 300 mg/dl AND signs/symptoms of ketoacidosis Care Advice:  ~ Another adult should drive. Write down provider's name. List or place the following in a bag for transport with the patient: current prescription and/or OTC medications; alternative treatments, therapies and medications; and street drugs.  ~  ~ If available, bring recent log of blood sugars or bring blood glucose monitor with log of blood sugars.  ~ Call EMS 911 if difficulty breathing, loses consciousness, confusion or fast pulse rate develops. Dehydration can affect blood sugar levels. Drink water during transport and while waiting to see a provider. If vomiting, take sips of water or suck on ice chips.  ~  ~ When sitting, use a chair with arms to help protect from falling.  ~ CAUTIONS 06/ Initial call taken by: Margaret Pyle, CMA,  March 12, 2010 8:12 AM  Follow-up for Phone Call        noted Follow-up by: Corwin Levins MD,  March 12, 2010 12:01 PM

## 2010-10-16 NOTE — Progress Notes (Signed)
Summary: Poison Ivy/ JWJ pt  Phone Note Call from Patient Call back at [facility] Phone [phone]   Caller: Patient Summary of Call: Pt called stating that Poison Ivy rash is not improving with cream. Pt is requesting additional medications, possibly oral steriod. Please advise Initial call taken by: Margaret Pyle, CMA,  February 26, 2010 4:08 PM  Follow-up for Phone Call        may use prednisone 10mg : 3 tabs q d x 3, 2 tabs once daily x 3, 1 tab once daily x 6. This will drive up the blood sugar for several weeks.  Follow-up by: Jacques Navy MD,  February 26, 2010 6:54 PM  Additional Follow-up for Phone Call Additional follow up Details #1::        pt informed, Rx to pharmacy per pt request Additional Follow-up by: Margaret Pyle, CMA,  February 27, 2010 8:18 AM    New/Updated Medications: PREDNISONE 10 MG TABS (PREDNISONE) 3 once daily x 3 days, 2 once daily x 3 days, then 1 once daily x 6 days Prescriptions: PREDNISONE 10 MG TABS (PREDNISONE) 3 once daily x 3 days, 2 once daily x 3 days, then 1 once daily x 6 days  #21 x 0   Entered by:   Margaret Pyle, CMA   Authorized by:   Jacques Navy MD   Signed by:   Margaret Pyle, CMA on 02/27/2010   Method used:   Electronically to        [facility] [phone]* (retail)       [address] [place], [place]  [address]       Ph: [phone]       Fax: [phone]   RxID:   [id number]

## 2010-10-25 ENCOUNTER — Telehealth: Payer: Self-pay | Admitting: Internal Medicine

## 2010-11-01 NOTE — Progress Notes (Signed)
Summary: ? about rx  Phone Note Call from Patient Call back at Home Phone [phone]   Caller: Patient Summary of Call: Pt left message on triage requesting callback regarding Onglyza rx and prescription savings card  Initial call taken by: Brenton Grills CMA Duncan Dull),  October 25, 2010 4:36 PM  Follow-up for Phone Call        left message for pt to callback office Follow-up by: Brenton Grills CMA Duncan Dull),  October 25, 2010 4:36 PM  Additional Follow-up for Phone Call Additional follow up Details #1::        pt states that she was told by pharmacist that she would have to pay $119 for Onglyza rx but that she still has the discount card she was given by Dr. Jonny Ruiz to receive rx for $10. pt was advised to call the number on the discount card to make sure it was still valid and to bring the discount card everytime to the pharmacy when needing a refill on the rx and to callback if the card is invalid to receive another discount card Additional Follow-up by: Brenton Grills CMA Duncan Dull),  October 25, 2010 4:56 PM

## 2010-11-15 ENCOUNTER — Encounter: Payer: Self-pay | Admitting: Internal Medicine

## 2010-11-15 ENCOUNTER — Other Ambulatory Visit: Payer: BC Managed Care – PPO

## 2010-11-15 ENCOUNTER — Other Ambulatory Visit: Payer: Self-pay | Admitting: Internal Medicine

## 2010-11-15 ENCOUNTER — Ambulatory Visit (INDEPENDENT_AMBULATORY_CARE_PROVIDER_SITE_OTHER): Payer: BC Managed Care – PPO | Admitting: Internal Medicine

## 2010-11-15 DIAGNOSIS — I10 Essential (primary) hypertension: Secondary | ICD-10-CM

## 2010-11-15 DIAGNOSIS — R109 Unspecified abdominal pain: Secondary | ICD-10-CM

## 2010-11-15 DIAGNOSIS — E119 Type 2 diabetes mellitus without complications: Secondary | ICD-10-CM

## 2010-11-15 DIAGNOSIS — E785 Hyperlipidemia, unspecified: Secondary | ICD-10-CM

## 2010-11-15 DIAGNOSIS — Z0000 Encounter for general adult medical examination without abnormal findings: Secondary | ICD-10-CM

## 2010-11-15 LAB — BASIC METABOLIC PANEL
BUN: 13 mg/dL (ref 6–23)
Calcium: 9.3 mg/dL (ref 8.4–10.5)
Chloride: 102 mEq/L (ref 96–112)
Creatinine, Ser: 0.6 mg/dL (ref 0.4–1.2)
Potassium: 4 mEq/L (ref 3.5–5.1)
Sodium: 139 mEq/L (ref 135–145)

## 2010-11-15 LAB — H. PYLORI ANTIBODY, IGG: H Pylori IgG: NEGATIVE

## 2010-11-15 LAB — LIPID PANEL
LDL Cholesterol: 67 mg/dL (ref 0–99)
Total CHOL/HDL Ratio: 2
Triglycerides: 66 mg/dL (ref 0.0–149.0)

## 2010-11-15 LAB — HEPATIC FUNCTION PANEL
ALT: 12 U/L (ref 0–35)
AST: 13 U/L (ref 0–37)
Alkaline Phosphatase: 58 U/L (ref 39–117)
Bilirubin, Direct: 0.1 mg/dL (ref 0.0–0.3)
Total Bilirubin: 0.5 mg/dL (ref 0.3–1.2)

## 2010-11-15 LAB — HEMOGLOBIN A1C: Hgb A1c MFr Bld: 6.4 % (ref 4.6–6.5)

## 2010-11-15 LAB — URINALYSIS, ROUTINE W REFLEX MICROSCOPIC
Nitrite: NEGATIVE
Total Protein, Urine: NEGATIVE

## 2010-11-15 LAB — CBC WITH DIFFERENTIAL/PLATELET
Basophils Relative: 0.9 % (ref 0.0–3.0)
Hemoglobin: 12.6 g/dL (ref 12.0–15.0)
MCHC: 33.6 g/dL (ref 30.0–36.0)
Monocytes Absolute: 0.5 10*3/uL (ref 0.1–1.0)
Neutro Abs: 5.7 10*3/uL (ref 1.4–7.7)
RDW: 14.4 % (ref 11.5–14.6)

## 2010-11-15 LAB — MICROALBUMIN / CREATININE URINE RATIO
Creatinine,U: 108 mg/dL
Microalb Creat Ratio: 2.1 mg/g (ref 0.0–30.0)

## 2010-11-16 ENCOUNTER — Encounter: Payer: Self-pay | Admitting: Internal Medicine

## 2010-11-19 ENCOUNTER — Ambulatory Visit
Admission: RE | Admit: 2010-11-19 | Discharge: 2010-11-19 | Disposition: A | Discharge status: Home / Self Care | Payer: BC Managed Care – PPO | Source: Ambulatory Visit | Attending: Internal Medicine | Admitting: Internal Medicine

## 2010-11-19 DIAGNOSIS — R109 Unspecified abdominal pain: Secondary | ICD-10-CM

## 2010-11-22 NOTE — Assessment & Plan Note (Signed)
Summary: SIDES PAIN---STC   Vital Signs:  Patient profile:   55 year old female Height:      62.5 inches Weight:      131 pounds BMI:     23.66 O2 Sat:      99 % on Room air Temp:     98.7 degrees F oral Pulse rate:   80 / minute BP sitting:   130 / 80  (left arm) Cuff size:   regular  Vitals Entered By: Zella Ball Ewing CMA Duncan Dull) (November 15, 2010 9:33 AM)  O2 Flow:  Room air  CC: Pain on both sides/RE   CC:  Pain on both sides/RE.  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;   c/o 2 months bilat lower abd pain, right more than left, graudlaly worse now moderate, daily  off and on, seems to notice more at night when resting tryking to go to sleep;  pressure quality - no really sharp or dull unless sharp flares with trying to turn over in bed;  had some dizziness in the past 2 days, upset over losing her job but not overly so it seems as she is covered for benefits through her husband;  husband with recent renal stone;  she has had bowel change with large soft stools unusual for her, but no blood, no n/v;  pain does radiate to the back;  urine has been somewhat darker than usual but o/w ok except for some dysuria this AM only;  no  urgency or hematuria.   s/p TAH, ovaries intact but no hx of cysts.  ? some vaginal d/c lately.   No hx of STD 's  in the past and monogamous. No fever, wt loss, night sweats, loss of appetite or other constitutional symptoms  Overall good compliance with meds, and good tolerability.  Denies worsening depressive symptoms, suicidal ideation, or panic.   Did have UTI in Nov 2011.  only take rare advil. no increased ETOH. last  colonscopy 2007  Pt denies CP, worsening sob, doe, wheezing, orthopnea, pnd, worsening LE edema, palps, dizziness or syncope  Pt denies new neuro symptoms such as headache, facial or extremity weakness  Pt denies polydipsia, polyuria,  or low sugar symptoms such as shakiness improved with eating.  Overall good compliance with meds, trying to follow low chol, DM diet, wt stable, little excercise however   Preventive Screening-Counseling & Management      Drug Use:  no.    Problems Prior to Update: 1)  Abdominal Pain Other Specified Site  (ICD-789.09) 2)  Dysuria  (ICD-788.1) 3)  Depression  (ICD-311) 4)  Headache  (ICD-784.0) 5)  Syncope  (ICD-780.2) 6)  Otitis Media, Acute, Right  (ICD-382.9) 7)  Abdominal Pain, Lower  (ICD-789.09) 8)  Sinusitis- Acute-nos  (ICD-461.9) 9)  Preventive Health Care  (ICD-V70.0) 10)  Anxiety  (ICD-300.00) 11)  Back Pain  (ICD-724.5) 12)  Uri  (ICD-465.9) 13)  Common Migraine  (ICD-346.10) 14)  Family History of Alcoholism/addiction  (ICD-V61.41) 15)  Hypertension  (ICD-401.9) 16)  Hyperlipidemia  (ICD-272.4) 17)  Diabetes Mellitus, Type II  (ICD-250.00)  Medications Prior to Update: 1)  Lisinopril 10 Mg Tabs (Lisinopril) .Marland Kitchen.. 1 By Mouth Once Daily 2)  Pravastatin Sodium 40 Mg Tabs (Pravastatin Sodium) .... Take 1 Tablet By Mouth Once A Day 3)  Ecotrin Low Strength 81 Mg  Tbec (Aspirin) .Marland KitchenMarland KitchenMarland Kitchen 1  By Mouth Qd 4)  Freestyle Lite Test   Strp (Glucose Blood) .... Use Asd Once Daily 5)  Freestyle Lancets   Misc (Lancets) .... Use Asd 1 Once Daily 6)  Metformin Hcl 500 Mg Xr24h-Tab (Metformin Hcl) .... 2 By Mouth Two Times A Day 7)  Sumatriptan Succinate 100 Mg Tabs (Sumatriptan Succinate) .Marland Kitchen.. 1 By Mouth Every Other Day As Needed 8)  Onglyza 5 Mg Tabs (Saxagliptin Hcl) .Marland Kitchen.. 1 By Mouth Once Daily 9)  Fluoxetine Hcl 20 Mg Caps (Fluoxetine Hcl) .Marland Kitchen.. 1po Once Daily 10)  Fluconazole 150 Mg Tabs (Fluconazole) .Marland Kitchen.. 1po Q 3 Days As Needed 11)  Levaquin 500 Mg Tabs (Levofloxacin) .Marland Kitchen.. 1po Once Daily  Current Medications (verified): 1)  Lisinopril 10 Mg Tabs (Lisinopril) .Marland Kitchen.. 1 By Mouth Once Daily 2)  Pravastatin Sodium 40 Mg Tabs (Pravastatin Sodium) .... Take 1 Tablet By Mouth Once A Day 3)   Ecotrin Low Strength 81 Mg  Tbec (Aspirin) .Marland Kitchen.. 1 By Mouth Qd 4)  Freestyle Lite Test   Strp (Glucose Blood) .... Use Asd Once Daily 5)  Freestyle Lancets   Misc (Lancets) .... Use Asd 1 Once Daily 6)  Metformin Hcl 500 Mg Xr24h-Tab (Metformin Hcl) .... 2 By Mouth Two Times A Day 7)  Sumatriptan Succinate 100 Mg Tabs (Sumatriptan Succinate) .Marland Kitchen.. 1 By Mouth Every Other Day As Needed 8)  Onglyza 5 Mg Tabs (Saxagliptin Hcl) .Marland Kitchen.. 1 By Mouth Once Daily 9)  Fluoxetine Hcl 20 Mg Caps (Fluoxetine Hcl) .Marland Kitchen.. 1po Once Daily 10)  Fluconazole 150 Mg Tabs (Fluconazole) .Marland Kitchen.. 1po Q 3 Days As Needed 11)  Ciprofloxacin Hcl 500 Mg Tabs (Ciprofloxacin Hcl) .Marland Kitchen.. 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 [employer] Never Smoked Alcohol use-no Drug use-no  Risk Factors: Smoking Status: never (10/29/2007)  Social History: Married 1 daughter Teacher, music for [employer] Never Smoked Alcohol use-no Drug use-no Drug Use:  no  Review of Systems  The patient denies anorexia, fever, vision loss, decreased hearing, hoarseness, chest pain, syncope, dyspnea on exertion, peripheral edema, prolonged cough, headaches, hemoptysis, melena, hematochezia, severe indigestion/heartburn, hematuria, muscle weakness, suspicious skin lesions, transient blindness, difficulty walking, depression, unusual weight change, abnormal bleeding, enlarged lymph nodes,  and angioedema.         all otherwise negative per pt -    Physical Exam  General:  alert and well-developed.   Head:  normocephalic and atraumatic.   Eyes:  vision grossly intact, pupils equal, and pupils round.   Ears:  R ear normal and L ear normal.   Nose:  no external deformity and no nasal discharge.   Mouth:  no gingival abnormalities and pharynx pink and moist.   Neck:  supple and no masses.   Lungs:  normal respiratory effort and normal breath sounds.   Heart:  normal rate and regular rhythm.   Abdomen:  soft and normal bowel sounds.  but has mild tender to RUQ, as well as mid low abd , and lesser tender to the RLQ and LLQ, all wtihout distension, mass, guarding or  rebound Msk:  no joint tenderness and no joint swelling.   Extremities:  no edema, no erythema  Psych:  not depressed appearing and slightly anxious.     Impression & Recommendations:  Problem # 1:  ABDOMINAL PAIN OTHER SPECIFIED SITE (ICD-789.09)  Her updated medication list for this problem includes:    Ecotrin Low Strength 81 Mg Tbec (Aspirin) .Marland Kitchen... 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 Pylori Abs(Helicobacter Pylori) (86677-HELICO) TLB-Lipase (83690-LIPASE) Radiology Referral (Radiology)  Problem # 2:  HYPERTENSION (ICD-401.9)  Her updated medication list for this problem includes:    Lisinopril 10 Mg Tabs (Lisinopril) .Marland Kitchen... 1 by mouth once daily  BP today: 130/80 Prior BP: 122/80 (07/26/2010)  Labs Reviewed: K+: 4.6 (05/28/2010) Creat: : 0.6 (05/28/2010)   Chol: 130 (05/28/2010)   HDL: 51.00 (05/28/2010)   LDL: 67 (05/28/2010)   TG: 62.0 (05/28/2010) stable overall by hx and exam, ok to continue meds/tx as is   Problem # 3:  DIABETES MELLITUS, TYPE II  (ICD-250.00)  Her updated medication list for this problem includes:    Lisinopril 10 Mg Tabs (Lisinopril) .Marland Kitchen... 1 by mouth once daily    Ecotrin Low Strength 81 Mg Tbec (Aspirin) .Marland Kitchen... 1 by mouth qd    Metformin Hcl 500 Mg Xr24h-tab (Metformin hcl) .Marland Kitchen... 2 by mouth two times a day    Onglyza 5 Mg Tabs (Saxagliptin hcl) .Marland Kitchen... 1 by mouth once daily  Orders: TLB-Microalbumin/Creat Ratio, Urine (82043-MALB) TLB-A1C / Hgb A1C (Glycohemoglobin) (83036-A1C)  Labs Reviewed: Creat: 0.6 (05/28/2010)    Reviewed HgBA1c results: 6.9 (05/28/2010)  7.9 (02/21/2010) stable overall by hx and exam, ok to continue meds/tx as is,  Pt to cont DM diet, excercise, wt control efforts; to check labs today   Problem # 4:  HYPERLIPIDEMIA (ICD-272.4)  Her updated medication list for this problem includes:    Pravastatin Sodium 40 Mg Tabs (Pravastatin sodium) .Marland Kitchen... Take 1 tablet by mouth once a day  Labs Reviewed: SGOT: 16 (10/26/2009)   SGPT: 15 (10/26/2009)   HDL:51.00 (05/28/2010), 50.80 (10/26/2009)  LDL:67 (05/28/2010), 59 (10/26/2009)  Chol:130 (05/28/2010), 124 (10/26/2009)  Trig:62.0 (05/28/2010), 72.0 (10/26/2009) stable overall by hx and exam, ok to continue meds/tx as is, Pt to continue diet efforts, good med tolerance; to check labs - goal LDL less than 70  Complete Medication List: 1)  Lisinopril 10 Mg Tabs (Lisinopril) .Marland Kitchen.. 1 by mouth once daily 2)  Pravastatin Sodium 40 Mg Tabs (Pravastatin sodium) .... Take 1 tablet by mouth once a day 3)  Ecotrin Low Strength 81 Mg Tbec (Aspirin) .Marland Kitchen.. 1 by mouth qd 4)  Freestyle Lite Test Strp (Glucose blood) .... Use asd once daily 5)  Freestyle Lancets Misc (Lancets) .... Use asd 1 once daily 6)  Metformin Hcl 500 Mg Xr24h-tab (Metformin hcl) .... 2 by mouth two times a day 7)  Sumatriptan Succinate 100 Mg Tabs (Sumatriptan succinate) .Marland Kitchen.. 1 by mouth every other day as needed 8)  Onglyza 5 Mg Tabs (Saxagliptin hcl) .Marland Kitchen.. 1 by mouth once daily 9)   Fluoxetine Hcl 20 Mg Caps (Fluoxetine hcl) .Marland Kitchen.. 1po once daily 10)  Fluconazole 150 Mg Tabs (Fluconazole) .Marland Kitchen.. 1po q 3 days as needed 11)  Ciprofloxacin Hcl 500 Mg Tabs (Ciprofloxacin hcl) .Marland Kitchen.. 1po two  times a day  Other Orders: TLB-BMP (Basic Metabolic Panel-BMET) (80048-METABOL) TLB-CBC Platelet - w/Differential (85025-CBCD) TLB-Hepatic/Liver Function Pnl (80076-HEPATIC) TLB-Lipid Panel (80061-LIPID) TLB-TSH (Thyroid Stimulating Hormone) (84443-TSH)  Patient Instructions: 1)  Please take all new medications as prescribed 2)  Continue all previous medications as before this visit  3)  Please go to the Lab in the basement for your blood and/or urine tests today 4)  Please call the number on the [facility] Card for results of your testing  5)  You will be contacted about the referral(s) to: ultrasound for the abdomen 6)  Please schedule a follow-up appointment as planned later this month Prescriptions: CIPROFLOXACIN HCL 500 MG TABS (CIPROFLOXACIN HCL) 1po two times a day  #20 x 0   Entered and Authorized by:   Corwin Levins MD   Signed by:   Corwin Levins MD on 11/15/2010   Method used:   Print then Give to Patient   RxID:   [id number]    Orders Added: 1)  TLB-Udip w/ Micro [81001-URINE] 2)  T-Culture, Urine [[id number]] 3)  TLB-H. Pylori Abs(Helicobacter Pylori) [86677-HELICO] 4)  TLB-Lipase [83690-LIPASE] 5)  TLB-BMP (Basic Metabolic Panel-BMET) [80048-METABOL] 6)  TLB-CBC Platelet - w/Differential [85025-CBCD] 7)  TLB-Hepatic/Liver Function Pnl [80076-HEPATIC] 8)  TLB-Lipid Panel [80061-LIPID] 9)  TLB-TSH (Thyroid Stimulating Hormone) [84443-TSH] 10)  Radiology Referral [Radiology] 11)  TLB-Microalbumin/Creat Ratio, Urine [82043-MALB] 12)  TLB-A1C / Hgb A1C (Glycohemoglobin) [83036-A1C] 13)  Est. Patient Level V [30865]

## 2010-11-30 ENCOUNTER — Ambulatory Visit (INDEPENDENT_AMBULATORY_CARE_PROVIDER_SITE_OTHER): Payer: BC Managed Care – PPO | Admitting: Internal Medicine

## 2010-11-30 ENCOUNTER — Encounter: Payer: Self-pay | Admitting: Internal Medicine

## 2010-11-30 DIAGNOSIS — M5412 Radiculopathy, cervical region: Secondary | ICD-10-CM

## 2010-11-30 DIAGNOSIS — K828 Other specified diseases of gallbladder: Secondary | ICD-10-CM | POA: Insufficient documentation

## 2010-11-30 DIAGNOSIS — M25519 Pain in unspecified shoulder: Secondary | ICD-10-CM

## 2010-11-30 DIAGNOSIS — K824 Cholesterolosis of gallbladder: Secondary | ICD-10-CM

## 2010-11-30 DIAGNOSIS — R109 Unspecified abdominal pain: Secondary | ICD-10-CM

## 2010-11-30 HISTORY — DX: Cholesterolosis of gallbladder: K82.4

## 2010-11-30 HISTORY — DX: Radiculopathy, cervical region: M54.12

## 2010-11-30 HISTORY — DX: Pain in unspecified shoulder: M25.519

## 2010-12-04 NOTE — Assessment & Plan Note (Signed)
Summary: FOLLOW UP /NWS #   Vital Signs:  Patient profile:   55 year old female Height:      62.5 inches Weight:      129.50 pounds BMI:     23.39 O2 Sat:      95 % on Room air Temp:     98.3 degrees F oral Pulse rate:   76 / minute BP sitting:   122 / 80  (left arm) Cuff size:   regular  Vitals Entered By: Zella Ball Ewing CMA Duncan Dull) (November 30, 2010 3:35 PM)  O2 Flow:  Room air  CC: followup/RE   CC:  followup/RE.  History of Present Illness: here with acute onset 1 wk gradually worsening moderate right neck pain with radiation to the distal RUE with numbness and weakness as well;  no bowel or bladder changes, gait change, fever, wt loss, injury of falls.  Also incidetnly with signficant 3 days right shoudler pain, sharp, midl to mod, worse to abduction and lying on the right side, nothing makes better.  Pt denies CP, worsening sob, doe, wheezing, orthopnea, pnd, worsening LE edema, palps, dizziness or syncope   Pt denies polydipsia, polyuria .  Overall good compliance with meds, trying to follow low chol diet, wt stable, gets regular excercise.  Overall abd pain improved, but with occasional reflux, without dysphagia, n/v, bowel change or blood.    Problems Prior to Update: 1)  Polyp, Gallbladder  (ICD-575.8) 2)  Shoulder Pain, Right  (ICD-719.41) 3)  Cervical Radiculopathy, Right  (ICD-723.4) 4)  Abdominal Pain Other Specified Site  (ICD-789.09) 5)  Dysuria  (ICD-788.1) 6)  Depression  (ICD-311) 7)  Headache  (ICD-784.0) 8)  Syncope  (ICD-780.2) 9)  Otitis Media, Acute, Right  (ICD-382.9) 10)  Abdominal Pain, Lower  (ICD-789.09) 11)  Sinusitis- Acute-nos  (ICD-461.9) 12)  Preventive Health Care  (ICD-V70.0) 13)  Anxiety  (ICD-300.00) 14)  Back Pain  (ICD-724.5) 15)  Uri  (ICD-465.9) 16)  Common Migraine  (ICD-346.10) 17)  Family History of Alcoholism/addiction  (ICD-V61.41) 18)  Hypertension  (ICD-401.9) 19)  Hyperlipidemia  (ICD-272.4) 20)  Diabetes Mellitus, Type II   (ICD-250.00)  Medications Prior to Update: 1)  Lisinopril 10 Mg Tabs (Lisinopril) .Marland Kitchen.. 1 By Mouth Once Daily 2)  Pravastatin Sodium 40 Mg Tabs (Pravastatin Sodium) .... Take 1 Tablet By Mouth Once A Day 3)  Ecotrin Low Strength 81 Mg  Tbec (Aspirin) .Marland Kitchen.. 1 By Mouth Qd 4)  Freestyle Lite Test   Strp (Glucose Blood) .... Use Asd Once Daily 5)  Freestyle Lancets   Misc (Lancets) .... Use Asd 1 Once Daily 6)  Metformin Hcl 500 Mg Xr24h-Tab (Metformin Hcl) .... 2 By Mouth Two Times A Day 7)  Sumatriptan Succinate 100 Mg Tabs (Sumatriptan Succinate) .Marland Kitchen.. 1 By Mouth Every Other Day As Needed 8)  Onglyza 5 Mg Tabs (Saxagliptin Hcl) .Marland Kitchen.. 1 By Mouth Once Daily 9)  Fluoxetine Hcl 20 Mg Caps (Fluoxetine Hcl) .Marland Kitchen.. 1po Once Daily 10)  Fluconazole 150 Mg Tabs (Fluconazole) .Marland Kitchen.. 1po Q 3 Days As Needed 11)  Ciprofloxacin Hcl 500 Mg Tabs (Ciprofloxacin Hcl) .Marland Kitchen.. 1po Two Times A Day  Current Medications (verified): 1)  Lisinopril 10 Mg Tabs (Lisinopril) .Marland Kitchen.. 1 By Mouth Once Daily 2)  Pravastatin Sodium 40 Mg Tabs (Pravastatin Sodium) .... Take 1 Tablet By Mouth Once A Day 3)  Ecotrin Low Strength 81 Mg  Tbec (Aspirin) .Marland Kitchen.. 1 By Mouth Qd 4)  Freestyle Lite Test   Strp (Glucose Blood) .Marland KitchenMarland KitchenMarland Kitchen  Use Asd Once Daily 5)  Freestyle Lancets   Misc (Lancets) .... Use Asd 1 Once Daily 6)  Metformin Hcl 500 Mg Xr24h-Tab (Metformin Hcl) .... 2 By Mouth Two Times A Day 7)  Sumatriptan Succinate 100 Mg Tabs (Sumatriptan Succinate) .Marland Kitchen.. 1 By Mouth Every Other Day As Needed 8)  Onglyza 5 Mg Tabs (Saxagliptin Hcl) .Marland Kitchen.. 1 By Mouth Once Daily 9)  Fluoxetine Hcl 20 Mg Caps (Fluoxetine Hcl) .Marland Kitchen.. 1po Once Daily 10)  Omeprazole 20 Mg Cpdr (Omeprazole) .Marland Kitchen.. 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   Head:  normocephalic and atraumatic.   Eyes:  vision grossly intact, pupils equal, and pupils round.   Ears:  R ear normal and L ear normal.   Nose:  no external deformity and no nasal discharge.   Mouth:  no gingival abnormalities and pharynx pink and moist.   Neck:  supple and no masses.   Lungs:  normal respiratory effort and normal breath sounds.   Heart:  normal rate and regular rhythm.   Abdomen:  soft, non-tender, and normal bowel sounds.   Msk:  neck and spine nontender and no paravertebral tender;  right shoulder with diffuse tender, no swelling and pain worse with impingement sign,   Extremities:  no edema, no erythema  Neurologic:  strength normal in all extremities, sensation intact to light touch, and DTRs symmetrical and normal.  except for decreased sens to LT to right C4, and 4+/5 diffuse RUE weakness and grip strength Skin:  color normal and no rashes.   Psych:  not depressed appearing and slightly anxious.     Impression & Recommendations:  Problem # 1:  CERVICAL RADICULOPATHY, RIGHT (ICD-723.4) moderate to severe with exam as above - for MRI c-spine and ortho referral Orders: Radiology Referral (Radiology) Orthopedic Surgeon Referral (Ortho Surgeon)  Problem # 2:  SHOULDER PAIN, RIGHT (ICD-719.41)  Her updated medication list for this problem includes:    Ecotrin Low Strength 81 Mg Tbec (Aspirin) .Marland Kitchen... 1 by mouth qd  Orders: Orthopedic Surgeon Referral (Ortho Surgeon) c/ prob rotater cuff tendonitis  - midl to mod, also refer to orhto as well - ? need  cortisone  Problem # 3:  ABDOMINAL PAIN OTHER SPECIFIED SITE (ICD-789.09)  Her updated medication list for this problem includes:    Ecotrin Low Strength 81 Mg Tbec (Aspirin) .Marland Kitchen... 1 by mouth qd u/s without mass ; for trial PPI - f./u any worsening symtpoms  Problem # 4:  POLYP, GALLBLADDER (ICD-575.8) will need other tx as above for now, but should consider gen surgury eval for possible CCX  Complete Medication List: 1)  Lisinopril 10 Mg Tabs (Lisinopril) .Marland Kitchen.. 1 by mouth once daily 2)  Pravastatin Sodium 40 Mg Tabs (Pravastatin sodium) .... Take 1 tablet by mouth once a day 3)  Ecotrin Low Strength 81 Mg Tbec (Aspirin) .Marland Kitchen.. 1 by mouth qd 4)  Freestyle Lite Test Strp (Glucose blood) .Marland KitchenMarland KitchenMarland Kitchen  Use asd once daily 5)  Freestyle Lancets Misc (Lancets) .... Use asd 1 once daily 6)  Metformin Hcl 500 Mg Xr24h-tab (Metformin hcl) .... 2 by mouth two times a day 7)  Sumatriptan Succinate 100 Mg Tabs (Sumatriptan succinate) .Marland Kitchen.. 1 by mouth every other day as needed 8)  Onglyza 5 Mg Tabs (Saxagliptin hcl) .Marland Kitchen.. 1 by mouth once daily 9)  Fluoxetine Hcl 20 Mg Caps (Fluoxetine hcl) .Marland Kitchen.. 1po once daily 10)  Omeprazole 20 Mg Cpdr (Omeprazole) .Marland Kitchen.. 1 by mouth once daily  Patient Instructions: 1)  Please take all new medications as prescribed  - the generic for prilosec 2)  Continue all previous medications as before this visit  3)  You will be contacted about the referral(s) to: MRI for the neck, and referral to orthopedic for the neck and the shoulder 4)  Please schedule a follow-up appointment as needed. Prescriptions: OMEPRAZOLE 20 MG CPDR (OMEPRAZOLE) 1 by mouth once daily  #30 x 11   Entered and Authorized by:   Corwin Levins MD   Signed by:   Corwin Levins MD on 11/30/2010   Method used:   Electronically to        [facility] [phone]* (retail)       [address] [place], [place]  [address]       Ph: [phone]       Fax: [phone]   RxID:   [id number]    Orders Added: 1)   Radiology Referral [Radiology] 2)  Orthopedic Surgeon Referral [Ortho Surgeon] 3)  Est. Patient Level IV [24401]

## 2010-12-14 ENCOUNTER — Other Ambulatory Visit: Payer: Self-pay | Admitting: Internal Medicine

## 2010-12-14 DIAGNOSIS — M5412 Radiculopathy, cervical region: Secondary | ICD-10-CM

## 2010-12-21 ENCOUNTER — Ambulatory Visit (HOSPITAL_COMMUNITY)
Admission: RE | Admit: 2010-12-21 | Discharge: 2010-12-21 | Disposition: A | Discharge status: Home / Self Care | Payer: BC Managed Care – PPO | Source: Ambulatory Visit | Attending: Internal Medicine | Admitting: Internal Medicine

## 2010-12-21 DIAGNOSIS — M5412 Radiculopathy, cervical region: Secondary | ICD-10-CM

## 2010-12-21 DIAGNOSIS — R209 Unspecified disturbances of skin sensation: Secondary | ICD-10-CM | POA: Insufficient documentation

## 2010-12-21 DIAGNOSIS — M47812 Spondylosis without myelopathy or radiculopathy, cervical region: Secondary | ICD-10-CM | POA: Insufficient documentation

## 2011-01-14 ENCOUNTER — Other Ambulatory Visit: Payer: Self-pay | Admitting: Internal Medicine

## 2011-01-21 ENCOUNTER — Ambulatory Visit (INDEPENDENT_AMBULATORY_CARE_PROVIDER_SITE_OTHER): Payer: BC Managed Care – PPO | Admitting: Endocrinology

## 2011-01-21 ENCOUNTER — Telehealth: Payer: Self-pay

## 2011-01-21 ENCOUNTER — Encounter: Payer: Self-pay | Admitting: Endocrinology

## 2011-01-21 VITALS — BP 122/80 | HR 73 | Temp 98.6°F | Ht 62.5 in | Wt 132.0 lb

## 2011-01-21 DIAGNOSIS — L299 Pruritus, unspecified: Secondary | ICD-10-CM

## 2011-01-21 MED ORDER — TRIAMCINOLONE ACETONIDE 0.05 % EX OINT: TOPICAL_OINTMENT | CUTANEOUS | Status: DC

## 2011-01-21 MED ORDER — METHYLPREDNISOLONE 4 MG PO KIT: PACK | ORAL | Status: AC

## 2011-01-21 MED ORDER — TRIAMCINOLONE ACETONIDE 0.5 % EX CREA: TOPICAL_CREAM | Freq: Three times a day (TID) | CUTANEOUS | Status: DC

## 2011-01-21 NOTE — Telephone Encounter (Signed)
Pharmacy called stating they do not have cream Rxd at today's office in stock and it would take several days to order the quantity MD prescribed. Pharmacy is suggesting 0.05% cream alternative in 15 gram tube. Please advise

## 2011-01-21 NOTE — Telephone Encounter (Signed)
ok 

## 2011-01-21 NOTE — Patient Instructions (Addendum)
Continue claritin (generic is ok) until symptoms are better. i have sent a precription to your pharmacy for a steroid "pack," and a cream to apply to your face as needed. I hope feel better soon.  If you don't feel better by next week, please call your doctor. When you are better, you should re-try the prozac.  Please call if your blood sugar goes over 200

## 2011-01-21 NOTE — Progress Notes (Signed)
  Subjective:    Patient ID: Ruth Crawford, female    DOB: Jan 01, 1956, 55 y.o.   MRN: [id number]  HPI Pt states 4 days of moderate itching of the face, and assoc rash.  She feels this was due to changing to a different prozac generic.  She says she gets allergy problems in general, but not like this.   She hesitates to stop the prozac, because it helps.  Review of Systems denies fever and sob.    Objective:   Physical Exam GENERAL: no distress.   Face:  Mild urticaria.  There is moderate periorbital swelling.      Assessment & Plan:  Pruritis, uncertain etiology.  New problem Depression, well-controlled.

## 2011-01-26 DIAGNOSIS — L299 Pruritus, unspecified: Secondary | ICD-10-CM | POA: Insufficient documentation

## 2011-02-01 NOTE — Assessment & Plan Note (Signed)
[facility]                           GASTROENTEROLOGY OFFICE NOTE   MAKELLA, BUCKINGHAM                       MRN:          [id number]  DATE:03/27/2006                            DOB:          1956/01/09    REFERRING PHYSICIAN:  Corwin Levins, MD   REASON FOR CONSULTATION:  Rectal bleeding.   HISTORY:  This is a pleasant 55 year old African-American female with a  history of hypertension and recently diagnosed type 2 diabetes, who is  referred through the courtesy of Dr. Jonny Ruiz regarding rectal bleeding.  The  patient reports having developed rectal bleeding about three weeks ago.  She  describes bright red blood per rectum for three consecutive days, after  having new problems with constipation.  She saw Dr. Jonny Ruiz and was prescribed  Glycolax.  This has helped her constipation.  She has seen no blood in the  past several weeks.  She denies having had any abdominal pain or rectal pain  associated with the bleeding.  She does tell me that she had a colonoscopy  in [place] about 10 years ago to evaluate rectal bleeding.  The best she  can recall, no abnormalities found.  Other general complaints include gas  and bloating.   PAST MEDICAL HISTORY:  1.  Hypertension.  2.  Diabetes.  3.  Hyperlipidemia.   PAST SURGICAL HISTORY:  1.  Hysterectomy (ovaries remain).  2.  Back surgery.   ALLERGIES:  No known drug allergies.   CURRENT MEDICATIONS:  1.  Actos 15 mg p.o. daily.  2.  Lisinopril 20 mg p.o. daily.   FAMILY HISTORY:  No family history of gastrointestinal malignancy.  Mother  with diabetes.  Father with heart disease.   SOCIAL HISTORY:  The patient is married with one daughter.  She lives with  her husband.  She is a Teacher, music for [employer].  She does not smoke or use alcohol.   REVIEW OF SYSTEMS:  Per diagnostic evaluation form.   PHYSICAL EXAMINATION:  VITAL SIGNS:  Blood pressure 120/64, heart rate 80,  weight 138.6 pounds.  She is 5 feet 2-1/2 inches in height.  GENERAL:  Well-appearing female in no acute distress.  HEENT:  Sclerae are anicteric.  Conjunctivae are pink.  Oral mucosa is  intact.  NECK:  No adenopathy.  LUNGS:  Clear.  HEART:  Regular.  ABDOMEN:  Soft without tenderness, mass, or hernia.  Good bowel sounds  heard.  RECTAL:  No external abnormalities.  There is no tenderness.  No internal  mass or other problems identified.  Stool is firm, brown, hemoccult  negative.  EXTREMITIES:  Without edema.   IMPRESSION:  This is a 55 year old female with diabetes and hypertension who  presents regarding transient rectal bleeding after new onset constipation.  No obvious cause identified on today's exam.  Rule out transient fissure.  Rule out internal hemorrhoid.  Rule out neoplasia.   RECOMMENDATIONS:  Colonoscopy with polypectomy if necessary.  The nature of  the procedure as well as the risks, benefits and alternatives have been  reviewed.  She understood and  agreed to procedure.  OsmoPrep has been  chosen.  In addition, will hold her Actos the day of her exam.  In the  meantime, she can continue to use Glycolax for her constipation.                                   Wilhemina Bonito. Eda Keys., MD   JNP/MedQ  DD:  03/27/2006  DT:  03/27/2006  Job #:  [id number]   cc:   Corwin Levins, MD

## 2011-04-05 ENCOUNTER — Other Ambulatory Visit: Payer: Self-pay | Admitting: Endocrinology

## 2011-04-09 ENCOUNTER — Other Ambulatory Visit: Payer: Self-pay | Admitting: Endocrinology

## 2011-04-18 ENCOUNTER — Other Ambulatory Visit: Payer: Self-pay | Admitting: Internal Medicine

## 2011-05-09 ENCOUNTER — Other Ambulatory Visit: Payer: Self-pay | Admitting: Internal Medicine

## 2011-07-22 ENCOUNTER — Ambulatory Visit (INDEPENDENT_AMBULATORY_CARE_PROVIDER_SITE_OTHER): Payer: BC Managed Care – PPO | Admitting: Internal Medicine

## 2011-07-22 ENCOUNTER — Encounter: Payer: Self-pay | Admitting: Internal Medicine

## 2011-07-22 ENCOUNTER — Other Ambulatory Visit (INDEPENDENT_AMBULATORY_CARE_PROVIDER_SITE_OTHER): Payer: BC Managed Care – PPO

## 2011-07-22 VITALS — BP 122/72 | HR 80 | Temp 98.9°F

## 2011-07-22 DIAGNOSIS — R1013 Epigastric pain: Secondary | ICD-10-CM

## 2011-07-22 DIAGNOSIS — Z23 Encounter for immunization: Secondary | ICD-10-CM

## 2011-07-22 DIAGNOSIS — R300 Dysuria: Secondary | ICD-10-CM

## 2011-07-22 DIAGNOSIS — A084 Viral intestinal infection, unspecified: Secondary | ICD-10-CM

## 2011-07-22 DIAGNOSIS — A088 Other specified intestinal infections: Secondary | ICD-10-CM

## 2011-07-22 LAB — URINALYSIS
Hgb urine dipstick: NEGATIVE
Nitrite: NEGATIVE
Specific Gravity, Urine: 1.02 (ref 1.000–1.030)
Total Protein, Urine: NEGATIVE

## 2011-07-22 MED ORDER — DICYCLOMINE HCL 10 MG PO CAPS: 10.0000 mg | ORAL_CAPSULE | Freq: Four times a day (QID) | ORAL | Status: AC | PRN

## 2011-07-22 NOTE — Patient Instructions (Signed)
It was good to see you today. Flu shot today as requested - If you develop worsening symptoms or fever, call and we can reconsider antibiotics, but it does not appear necessary to use antibiotics at this time. Bentyl as needed for abdominal pain and cramping - Your prescription(s) have been submitted to your pharmacy. Please take as directed and contact our office if you believe you are having problem(s) with the medication(s). Test(s) ordered today. Your results will be called to you after review (48-72hours after test completion). If any changes need to be made, you will be notified at that time.  B.R.A.T. Diet Your doctor has recommended the B.R.A.T. diet for you or your child until the condition improves. This is often used to help control diarrhea and vomiting symptoms. If you or your child can tolerate clear liquids, you may have:  Bananas.     Rice.    Applesauce.    Toast (and other simple starches such as crackers, potatoes, noodles).  Be sure to avoid dairy products, meats, and fatty foods until symptoms are better. Fruit juices such as apple, grape, and prune juice can make diarrhea worse. Avoid these. Continue this diet for 2 days or as instructed by your caregiver. Document Released: 09/02/2005 Document Revised: 05/15/2011 Document Reviewed: 02/19/2007 [employer] Patient Information 2012 [employer], [place]  Viral Gastroenteritis Viral gastroenteritis is also known as stomach flu. This condition affects the stomach and intestinal tract. The illness typically lasts 3 to 8 days. Most people develop an immune response. This eventually gets rid of the virus. While this natural response develops, the virus can make you quite ill.   CAUSES   Diarrhea and vomiting are often caused by a virus. Medicines (antibiotics) that kill germs will not help unless there is also a germ (bacterial) infection. SYMPTOMS   The most common symptom is diarrhea. This can cause severe loss of fluids  (dehydration) and body salt (electrolyte) imbalance. TREATMENT   Treatments for this illness are aimed at rehydration. Antidiarrheal medicines are not recommended. They do not decrease diarrhea volume and may be harmful. Usually, home treatment is all that is needed. The most serious cases involve vomiting so severely that you are not able to keep down fluids taken by mouth (orally). In these cases, intravenous (IV) fluids are needed. Vomiting with viral gastroenteritis is common, but it will usually go away with treatment. HOME CARE INSTRUCTIONS   Small amounts of fluids should be taken frequently. Large amounts at one time may not be tolerated. Plain water may be harmful in infants and the elderly. Oral rehydration solutions (ORS) are available at pharmacies and grocery stores. ORS replace water and important electrolytes in proper proportions. Sports drinks are not as effective as ORS and may be harmful due to sugars worsening diarrhea.  As a general guideline for children, replace any new fluid losses from diarrhea or vomiting with ORS as follows:     If your child weighs 22 pounds or under (10 kg or less), give 60-120 mL (1/4 - 1/2 cup or 2 - 4 ounces) of ORS for each diarrheal stool or vomiting episode.     If your child weighs more than 22 pounds (more than 10 kgs), give 120-240 mL (1/2 - 1 cup or 4 - 8 ounces) of ORS for each diarrheal stool or vomiting episode.     In a child with vomiting, it may be helpful to give the above ORS replacement in 5 mL (1 teaspoon) amounts every 5 minutes, then  increase as tolerated.     While correcting for dehydration, children should eat normally. However, foods high in sugar should be avoided because this may worsen diarrhea. Large amounts of carbonated soft drinks, juice, gelatin desserts, and other highly sugared drinks should be avoided.     After correction of dehydration, other liquids that are appealing to the child may be added. Children should  drink small amounts of fluids frequently and fluids should be increased as tolerated.     Adults should eat normally while drinking more fluids than usual. Drink small amounts of fluids frequently and increase as tolerated. Drink enough water and fluids to keep your urine clear or pale yellow. Broths, weak decaffeinated tea, lemon-lime soft drinks (allowed to go flat), and ORS replace fluids and electrolytes.     Avoid:    Carbonated drinks.     Juice.    Extremely hot or cold fluids.     Caffeine drinks.     Fatty, greasy foods.     Alcohol.    Tobacco.    Too much intake of anything at one time.     Gelatin desserts.     Probiotics are active cultures of beneficial bacteria. They may lessen the amount and number of diarrheal stools in adults. Probiotics can be found in yogurt with active cultures and in supplements.     Wash your hands well to avoid spreading bacteria and viruses.     Antidiarrheal medicines are not recommended for infants and children.     Only take over-the-counter or prescription medicines for pain, discomfort, or fever as directed by your caregiver. Do not give aspirin to children.     For adults with dehydration, ask your caregiver if you should continue all prescribed and over-the-counter medicines.     If your caregiver has given you a follow-up appointment, it is very important to keep that appointment. Not keeping the appointment could result in a lasting (chronic) or permanent injury and disability. If there is any problem keeping the appointment, you must call to reschedule.  SEEK IMMEDIATE MEDICAL CARE IF:    You are unable to keep fluids down.     There is no urine output in 6 to 8 hours or there is only a small amount of very dark urine.     You develop shortness of breath.     There is blood in the vomit (may look like coffee grounds) or stool.     Belly (abdominal) pain develops, increases, or localizes.     There is persistent  vomiting or diarrhea.     You have a fever.     Your baby is older than 3 months with a rectal temperature of 102 F (38.9 C) or higher.     Your baby is 92 months old or younger with a rectal temperature of 100.4 F (38 C) or higher.  MAKE SURE YOU:    Understand these instructions.     Will watch your condition.     Will get help right away if you are not doing well or get worse.  Document Released: 09/02/2005 Document Revised: 05/15/2011 Document Reviewed: 01/14/2007 [facility] Patient Information 2012 [place], [place].

## 2011-07-22 NOTE — Progress Notes (Signed)
  Subjective:    Patient ID: Ruth Crawford, female    DOB: 1956-03-12, 55 y.o.   MRN: [id number]  Abdominal Pain This is a new problem. The current episode started in the past 7 days. The onset quality is gradual. The problem occurs intermittently. The problem has been waxing and waning. The pain is located in the epigastric region and suprapubic region. The pain is at a severity of 3/10. The quality of the pain is colicky and cramping. The abdominal pain does not radiate. Associated symptoms include anorexia and dysuria. Pertinent negatives include no constipation, diarrhea, fever, hematochezia, hematuria, melena, myalgias, nausea, vomiting or weight loss. The pain is aggravated by eating and urination. The pain is relieved by being still. She has tried nothing for the symptoms. Prior diagnostic workup includes ultrasound.   Past Medical History  Diagnosis Date  . DIABETES MELLITUS, TYPE II   . HYPERLIPIDEMIA   . ANXIETY   . DEPRESSION   . COMMON MIGRAINE   . HYPERTENSION   . POLYP, GALLBLADDER     Review of Systems  Constitutional: Negative for fever and weight loss.  Gastrointestinal: Positive for abdominal pain and anorexia. Negative for nausea, vomiting, diarrhea, constipation, melena and hematochezia.  Genitourinary: Positive for dysuria. Negative for hematuria.  Musculoskeletal: Negative for myalgias.       Objective:   Physical Exam BP 122/72  Pulse 80  Temp(Src) 98.9 F (37.2 C) (Oral)  SpO2 99% Wt Readings from Last 3 Encounters:  01/21/11 132 lb (59.875 kg)  11/30/10 129 lb 8 oz (58.741 kg)  11/15/10 131 lb (59.421 kg)   Constitutional: She appears well-developed and well-nourished. No distress. spouse at side Neck: Normal range of motion. Neck supple. No JVD present. No thyromegaly present.  Cardiovascular: Normal rate, regular rhythm and normal heart sounds.  No murmur heard. No BLE edema. Pulmonary/Chest: Effort normal and breath sounds normal. No respiratory  distress. She has no wheezes.  Abdominal: Soft. Bowel sounds are normal. She exhibits no distension. There is no tenderness. no masses Psychiatric: She has a normal mood and affect. Her behavior is normal. Judgment and thought content normal.   Lab Results  Component Value Date   WBC 8.8 11/15/2010   HGB 12.6 11/15/2010   HCT 37.5 11/15/2010   PLT 370.0 11/15/2010   GLUCOSE 111* 11/15/2010   CHOL 143 11/15/2010   TRIG 66.0 11/15/2010   HDL 63.30 11/15/2010   LDLCALC 67 11/15/2010   ALT 12 11/15/2010   AST 13 11/15/2010   NA 139 11/15/2010   K 4.0 11/15/2010   CL 102 11/15/2010   CREATININE 0.6 11/15/2010   BUN 13 11/15/2010   CO2 29 11/15/2010   TSH 0.67 11/15/2010   HGBA1C 6.4 11/15/2010   MICROALBUR 2.3* 11/15/2010       Assessment & Plan:  abdominal pain - cramping intermittently <1 week - suspect viral gastroenteritis - prior US 11/2010 benign and labs previously unremarkable - Advised bentyl prn and hydration - tylenol/ ibuprofen prn and check UA due to dysuria - will tx if UTI but otherwise hold empiric antibiotics - pt understands and agrees - will callif worse

## 2011-10-08 ENCOUNTER — Other Ambulatory Visit: Payer: Self-pay | Admitting: Internal Medicine

## 2012-02-12 ENCOUNTER — Other Ambulatory Visit: Payer: Self-pay | Admitting: Internal Medicine

## 2012-03-04 ENCOUNTER — Other Ambulatory Visit: Payer: Self-pay | Admitting: Internal Medicine

## 2012-03-19 ENCOUNTER — Other Ambulatory Visit: Payer: Self-pay | Admitting: Internal Medicine

## 2012-04-07 ENCOUNTER — Other Ambulatory Visit (INDEPENDENT_AMBULATORY_CARE_PROVIDER_SITE_OTHER): Payer: BC Managed Care – PPO

## 2012-04-07 ENCOUNTER — Encounter: Payer: Self-pay | Admitting: Internal Medicine

## 2012-04-07 ENCOUNTER — Ambulatory Visit (INDEPENDENT_AMBULATORY_CARE_PROVIDER_SITE_OTHER): Payer: BC Managed Care – PPO | Admitting: Internal Medicine

## 2012-04-07 VITALS — BP 120/88 | HR 85 | Temp 98.3°F | Ht 62.0 in | Wt 134.2 lb

## 2012-04-07 DIAGNOSIS — E119 Type 2 diabetes mellitus without complications: Secondary | ICD-10-CM

## 2012-04-07 DIAGNOSIS — Z0001 Encounter for general adult medical examination with abnormal findings: Secondary | ICD-10-CM | POA: Insufficient documentation

## 2012-04-07 DIAGNOSIS — Z0000 Encounter for general adult medical examination without abnormal findings: Secondary | ICD-10-CM

## 2012-04-07 LAB — CBC WITH DIFFERENTIAL/PLATELET
Basophils Relative: 1.4 % (ref 0.0–3.0)
Eosinophils Relative: 2.1 % (ref 0.0–5.0)
HCT: 38.6 % (ref 36.0–46.0)
Hemoglobin: 12.6 g/dL (ref 12.0–15.0)
Lymphs Abs: 1.6 10*3/uL (ref 0.7–4.0)
MCV: 82.3 fl (ref 78.0–100.0)
Monocytes Relative: 5.7 % (ref 3.0–12.0)
Platelets: 364 10*3/uL (ref 150.0–400.0)
RBC: 4.7 Mil/uL (ref 3.87–5.11)
WBC: 8.1 10*3/uL (ref 4.5–10.5)

## 2012-04-07 LAB — HEPATIC FUNCTION PANEL
ALT: 16 U/L (ref 0–35)
AST: 15 U/L (ref 0–37)
Albumin: 4.2 g/dL (ref 3.5–5.2)
Alkaline Phosphatase: 70 U/L (ref 39–117)

## 2012-04-07 LAB — URINALYSIS, ROUTINE W REFLEX MICROSCOPIC
Bilirubin Urine: NEGATIVE
Ketones, ur: NEGATIVE
Leukocytes, UA: NEGATIVE
Specific Gravity, Urine: 1.02 (ref 1.000–1.030)
Total Protein, Urine: NEGATIVE
Urine Glucose: 250
pH: 5.5 (ref 5.0–8.0)

## 2012-04-07 LAB — BASIC METABOLIC PANEL
CO2: 30 mEq/L (ref 19–32)
Calcium: 9.8 mg/dL (ref 8.4–10.5)
Creatinine, Ser: 0.7 mg/dL (ref 0.4–1.2)
Glucose, Bld: 177 mg/dL — ABNORMAL HIGH (ref 70–99)

## 2012-04-07 LAB — TSH: TSH: 0.56 u[IU]/mL (ref 0.35–5.50)

## 2012-04-07 LAB — LIPID PANEL
Total CHOL/HDL Ratio: 2
Triglycerides: 91 mg/dL (ref 0.0–149.0)

## 2012-04-07 LAB — HEMOGLOBIN A1C: Hgb A1c MFr Bld: 7 % — ABNORMAL HIGH (ref 4.6–6.5)

## 2012-04-07 MED ORDER — GLUCOSE BLOOD VI STRP: ORAL_STRIP | Status: DC

## 2012-04-07 MED ORDER — LISINOPRIL 10 MG PO TABS: 10.0000 mg | ORAL_TABLET | Freq: Every day | ORAL | Status: DC

## 2012-04-07 MED ORDER — FLUOXETINE HCL 20 MG PO CAPS: 20.0000 mg | ORAL_CAPSULE | Freq: Every day | ORAL | Status: DC

## 2012-04-07 MED ORDER — OMEPRAZOLE 20 MG PO CPDR: 20.0000 mg | DELAYED_RELEASE_CAPSULE | Freq: Every day | ORAL | Status: DC

## 2012-04-07 MED ORDER — SAXAGLIPTIN HCL 5 MG PO TABS: 5.0000 mg | ORAL_TABLET | Freq: Every day | ORAL | Status: DC

## 2012-04-07 MED ORDER — PRAVASTATIN SODIUM 40 MG PO TABS: 40.0000 mg | ORAL_TABLET | Freq: Every day | ORAL | Status: DC

## 2012-04-07 MED ORDER — FREESTYLE LANCETS MISC: Status: DC

## 2012-04-07 MED ORDER — METFORMIN HCL ER 500 MG PO TB24: ORAL_TABLET | ORAL | Status: DC

## 2012-04-07 MED ORDER — SUMATRIPTAN SUCCINATE 100 MG PO TABS: 100.0000 mg | ORAL_TABLET | ORAL | Status: DC | PRN

## 2012-04-07 NOTE — Progress Notes (Signed)
Subjective:    Patient ID: Ruth Crawford, female    DOB: 08/18/1956, 57 y.o.   MRN: [id number]  HPI  Here for wellness and f/u;  Overall doing ok;  Pt denies CP, worsening SOB, DOE, wheezing, orthopnea, PND, worsening LE edema, palpitations, dizziness or syncope.  Pt denies neurological change such as new Headache, facial or extremity weakness.  Pt denies polydipsia, polyuria, or low sugar symptoms. Pt states overall good compliance with treatment and medications, good tolerability, and trying to follow lower cholesterol diet.  Pt denies worsening depressive symptoms, suicidal ideation or panic. No fever, wt loss, night sweats, loss of appetite, or other constitutional symptoms.  Pt states good ability with ADL's, low fall risk, home safety reviewed and adequate, no significant changes in hearing or vision, and occasionally active with exercise.  Does have sense of ongoing fatigue, but denies signficant hypersomnolence. Past Medical History  Diagnosis Date  . DIABETES MELLITUS, TYPE II   . HYPERLIPIDEMIA   . ANXIETY   . DEPRESSION   . COMMON MIGRAINE   . HYPERTENSION   . POLYP, GALLBLADDER    Past Surgical History  Procedure Date  . Abdominal hysterectomy 2000  . Lumbar disc surgery 1998    s/p    reports that she has never smoked. She has never used smokeless tobacco. She reports that she does not drink alcohol or use illicit drugs. family history includes Alcohol abuse in her other; Arthritis in her other; Diabetes in her mother; Heart disease in her father; and Stroke in her other. No Known Allergies Current Outpatient Prescriptions on File Prior to Visit  Medication Sig Dispense Refill  . aspirin 81 MG tablet Take 81 mg by mouth daily.        Marland Kitchen FLUoxetine (PROZAC) 20 MG capsule Take 1 capsule (20 mg total) by mouth daily.  90 capsule  3  . glucose blood (FREESTYLE LITE) test strip Use as instructed 1 once daily Use as instructed 1 once daily  100 each  11  . Lancets (FREESTYLE)  lancets Use as instructed 1 once daily  100 each  11  . lisinopril (PRINIVIL,ZESTRIL) 10 MG tablet Take 1 tablet (10 mg total) by mouth daily.  90 tablet  3  . metFORMIN (GLUCOPHAGE-XR) 500 MG 24 hr tablet TAKE TWO TABLETS BY MOUTH TWICE DAILY  360 tablet  3  . omeprazole (PRILOSEC) 20 MG capsule Take 1 capsule (20 mg total) by mouth daily.  90 capsule  3  . pravastatin (PRAVACHOL) 40 MG tablet Take 1 tablet (40 mg total) by mouth daily.  90 tablet  3  . saxagliptin HCl (ONGLYZA) 5 MG TABS tablet Take 1 tablet (5 mg total) by mouth daily.  90 tablet  3  . SUMAtriptan (IMITREX) 100 MG tablet Take 1 tablet (100 mg total) by mouth every other day as needed.  10 tablet  11  . triamcinolone (KENALOG) 0.5 % cream Apply topically 3 (three) times daily.  30 g  1   Review of Systems Review of Systems  Constitutional: Negative for diaphoresis, activity change, appetite change and unexpected weight change.  HENT: Negative for hearing loss, ear pain, facial swelling, mouth sores and neck stiffness.   Eyes: Negative for pain, redness and visual disturbance.  Respiratory: Negative for shortness of breath and wheezing.   Cardiovascular: Negative for chest pain and palpitations.  Gastrointestinal: Negative for diarrhea, blood in stool, abdominal distention and rectal pain.  Genitourinary: Negative for hematuria, flank pain and decreased urine  volume.  Musculoskeletal: Negative for myalgias and joint swelling.  Skin: Negative for color change and wound.  Neurological: Negative for syncope and numbness.  Hematological: Negative for adenopathy.  Psychiatric/Behavioral: Negative for hallucinations, self-injury, decreased concentration and agitation.      Objective:   Physical Exam BP 120/88  Pulse 85  Temp 98.3 F (36.8 C) (Oral)  Ht 5\' 2"  (1.575 m)  Wt 134 lb 4 oz (60.895 kg)  BMI 24.55 kg/m2  SpO2 96% Physical Exam  VS noted Constitutional: Pt is oriented to person, place, and time. Appears  well-developed and well-nourished.  HENT:  Head: Normocephalic and atraumatic.  Right Ear: External ear normal.  Left Ear: External ear normal.  Nose: Nose normal.  Mouth/Throat: Oropharynx is clear and moist.  Eyes: Conjunctivae and EOM are normal. Pupils are equal, round, and reactive to light.  Neck: Normal range of motion. Neck supple. No JVD present. No tracheal deviation present.  Cardiovascular: Normal rate, regular rhythm, normal heart sounds and intact distal pulses.   Pulmonary/Chest: Effort normal and breath sounds normal.  Abdominal: Soft. Bowel sounds are normal. There is no tenderness.  Musculoskeletal: Normal range of motion. Exhibits no edema.  Lymphadenopathy:  Has no cervical adenopathy.  Neurological: Pt is alert and oriented to person, place, and time. Pt has normal reflexes. No cranial nerve deficit.  Skin: Skin is warm and dry. No rash noted.  Psychiatric:  Has  normal mood and affect. Behavior is normal.     Assessment & Plan:

## 2012-04-07 NOTE — Patient Instructions (Addendum)
Please remember to followup with your GYN for the yearly pap smear and/or mammogram Please go to LAB in the Basement for the blood and/or urine tests to be done today You will be contacted by phone if any changes need to be made immediately.  Otherwise, you will receive a letter about your results with an explanation. Your medications and supplies were all refilled to the pharmacy Please return in 6 mo with Lab testing done 3-5 days before

## 2012-04-11 ENCOUNTER — Encounter: Payer: Self-pay | Admitting: Internal Medicine

## 2012-04-11 NOTE — Assessment & Plan Note (Signed)
stable overall by hx and exam, most recent data reviewed with pt, and pt to continue medical treatment as before Lab Results  Component Value Date   HGBA1C 7.0* 04/07/2012

## 2012-04-11 NOTE — Assessment & Plan Note (Signed)

## 2012-04-20 ENCOUNTER — Other Ambulatory Visit: Payer: Self-pay

## 2012-04-20 MED ORDER — GLUCOSE BLOOD VI STRP: ORAL_STRIP | Status: DC

## 2012-05-26 ENCOUNTER — Other Ambulatory Visit: Payer: Self-pay | Admitting: Endocrinology

## 2012-05-29 ENCOUNTER — Other Ambulatory Visit: Payer: Self-pay | Admitting: Endocrinology

## 2012-08-11 ENCOUNTER — Ambulatory Visit: Payer: BC Managed Care – PPO

## 2012-08-18 ENCOUNTER — Ambulatory Visit (INDEPENDENT_AMBULATORY_CARE_PROVIDER_SITE_OTHER): Payer: BC Managed Care – PPO

## 2012-08-18 DIAGNOSIS — Z23 Encounter for immunization: Secondary | ICD-10-CM

## 2012-09-21 ENCOUNTER — Other Ambulatory Visit: Payer: Self-pay | Admitting: Internal Medicine

## 2012-09-23 ENCOUNTER — Telehealth: Payer: Self-pay | Admitting: Internal Medicine

## 2012-09-23 MED ORDER — LOVASTATIN 40 MG PO TABS: 40.0000 mg | ORAL_TABLET | Freq: Every day | ORAL | Status: DC

## 2012-09-23 NOTE — Telephone Encounter (Signed)
Patient can not afford her pravastatin and she needs an alternative sent in to [facility] on [address] in [facility]

## 2012-09-23 NOTE — Telephone Encounter (Signed)
Ok to change to lovastatin 40 mg - done erx

## 2012-09-24 NOTE — Telephone Encounter (Signed)
Patient informed. 

## 2012-10-09 ENCOUNTER — Ambulatory Visit: Payer: BC Managed Care – PPO | Admitting: Internal Medicine

## 2012-10-15 ENCOUNTER — Other Ambulatory Visit (INDEPENDENT_AMBULATORY_CARE_PROVIDER_SITE_OTHER): Payer: BC Managed Care – PPO

## 2012-10-15 DIAGNOSIS — E119 Type 2 diabetes mellitus without complications: Secondary | ICD-10-CM

## 2012-10-15 LAB — BASIC METABOLIC PANEL
BUN: 10 mg/dL (ref 6–23)
GFR: 130.18 mL/min (ref >60.00)
Potassium: 3.6 mEq/L (ref 3.5–5.1)
Sodium: 140 mEq/L (ref 135–145)

## 2012-10-15 LAB — LIPID PANEL
Cholesterol: 125 mg/dL (ref 0–200)
LDL Cholesterol: 58 mg/dL (ref 0–99)
Total CHOL/HDL Ratio: 3
VLDL: 18.4 mg/dL (ref 0.0–40.0)

## 2012-10-16 ENCOUNTER — Encounter: Payer: Self-pay | Admitting: Internal Medicine

## 2012-10-16 ENCOUNTER — Ambulatory Visit (INDEPENDENT_AMBULATORY_CARE_PROVIDER_SITE_OTHER): Payer: BC Managed Care – PPO | Admitting: Internal Medicine

## 2012-10-16 VITALS — BP 132/80 | HR 81 | Temp 99.0°F | Ht 62.5 in | Wt 133.2 lb

## 2012-10-16 DIAGNOSIS — Z0000 Encounter for general adult medical examination without abnormal findings: Secondary | ICD-10-CM

## 2012-10-16 DIAGNOSIS — K219 Gastro-esophageal reflux disease without esophagitis: Secondary | ICD-10-CM

## 2012-10-16 DIAGNOSIS — E119 Type 2 diabetes mellitus without complications: Secondary | ICD-10-CM

## 2012-10-16 DIAGNOSIS — E785 Hyperlipidemia, unspecified: Secondary | ICD-10-CM

## 2012-10-16 DIAGNOSIS — I10 Essential (primary) hypertension: Secondary | ICD-10-CM

## 2012-10-16 MED ORDER — PANTOPRAZOLE SODIUM 40 MG PO TBEC: 40.0000 mg | DELAYED_RELEASE_TABLET | Freq: Every day | ORAL | Status: DC

## 2012-10-16 NOTE — Assessment & Plan Note (Addendum)
ECG reviewed as per emr, stable overall by history and exam, recent data reviewed with pt, and pt to continue medical treatment as before,  to f/u any worsening symptoms or concerns Lab Results  Component Value Date   HGBA1C 6.6* 10/15/2012

## 2012-10-16 NOTE — Patient Instructions (Addendum)
Ok to stop the omeprazole Please take all new medication as prescribed  - the generic protonix 40 mg per day Please continue your efforts at being more active, low cholesterol diet, and weight control. Thank you for enrolling in MyChart. Please follow the instructions below to securely access your online medical record. MyChart allows you to send messages to your doctor, view your test results, renew your prescriptions, schedule appointments, and more. To Log into My Chart online, please go by [employer] or [employer] to [email or web address], or download the MyChart App from the [employer] of [employer] .  Your Username is: [email or web address] (pass buster) Please send a practice Message on Mychart later today. Please return in 6 months, or sooner if needed, with Lab testing done 3-5 days before

## 2012-10-17 ENCOUNTER — Encounter: Payer: Self-pay | Admitting: Internal Medicine

## 2012-10-17 DIAGNOSIS — K219 Gastro-esophageal reflux disease without esophagitis: Secondary | ICD-10-CM

## 2012-10-17 HISTORY — DX: Gastro-esophageal reflux disease without esophagitis: K21.9

## 2012-10-17 NOTE — Assessment & Plan Note (Signed)
Ok to change to protonix 40, consider GI referral if not improved

## 2012-10-17 NOTE — Assessment & Plan Note (Signed)
stable overall by history and exam, recent data reviewed with pt, and pt to continue medical treatment as before,  to f/u any worsening symptoms or concerns Lab Results  Component Value Date   LDLCALC 58 10/15/2012

## 2012-10-17 NOTE — Progress Notes (Signed)
Subjective:    Patient ID: Ruth Crawford, female    DOB: 1955/09/18, 57 y.o.   MRN: [id number]  HPI  Here to f/u; overall doing ok,  Pt denies chest pain, increased sob or doe, wheezing, orthopnea, PND, increased LE swelling, palpitations, dizziness or syncope.  Pt denies polydipsia, polyuria, or low sugar symptoms such as weakness or confusion improved with po intake.  Pt denies new neurological symptoms such as new headache, or facial or extremity weakness or numbness.   Pt states overall good compliance with meds, has been trying to follow lower cholesterol, diabetic diet, with wt overall stable,  but little exercise however.  Has had frequent reflux breakthrough on current prilosec 20 mg but no abd pain, dysphagia, n/v, bowel change or blood. Past Medical History  Diagnosis Date  . DIABETES MELLITUS, TYPE II   . HYPERLIPIDEMIA   . ANXIETY   . DEPRESSION   . COMMON MIGRAINE   . HYPERTENSION   . POLYP, GALLBLADDER    Past Surgical History  Procedure Date  . Abdominal hysterectomy 2000  . Lumbar disc surgery 1998    s/p    reports that she has never smoked. She has never used smokeless tobacco. She reports that she does not drink alcohol or use illicit drugs. family history includes Alcohol abuse in her other; Arthritis in her other; Diabetes in her mother; Heart disease in her father; and Stroke in her other. No Known Allergies Current Outpatient Prescriptions on File Prior to Visit  Medication Sig Dispense Refill  . aspirin 81 MG tablet Take 81 mg by mouth daily.        Marland Kitchen FLUoxetine (PROZAC) 20 MG capsule Take 1 capsule (20 mg total) by mouth daily.  90 capsule  3  . glucose blood (ACCU-CHEK ACTIVE STRIPS) test strip Use as directed once daily to check blood sugar.  100 each  11  . glucose blood (FREESTYLE LITE) test strip Use as instructed 1 once daily Use as instructed 1 once daily  100 each  11  . Lancets (FREESTYLE) lancets Use as instructed 1 once daily  100 each  11  .  lisinopril (PRINIVIL,ZESTRIL) 10 MG tablet Take 1 tablet (10 mg total) by mouth daily.  90 tablet  3  . lisinopril (PRINIVIL,ZESTRIL) 10 MG tablet TAKE ONE TABLET BY MOUTH EVERY DAY  90 tablet  1  . lovastatin (MEVACOR) 40 MG tablet Take 1 tablet (40 mg total) by mouth daily.  90 tablet  3  . metFORMIN (GLUCOPHAGE-XR) 500 MG 24 hr tablet TAKE TWO TABLETS BY MOUTH TWICE DAILY  360 tablet  3  . metFORMIN (GLUCOPHAGE-XR) 500 MG 24 hr tablet TAKE TWO TABLETS BY MOUTH TWICE DAILY  120 tablet  1  . saxagliptin HCl (ONGLYZA) 5 MG TABS tablet Take 1 tablet (5 mg total) by mouth daily.  90 tablet  3  . SUMAtriptan (IMITREX) 100 MG tablet Take 1 tablet (100 mg total) by mouth every other day as needed.  10 tablet  11  . pantoprazole (PROTONIX) 40 MG tablet Take 1 tablet (40 mg total) by mouth daily.  90 tablet  3  . triamcinolone (KENALOG) 0.5 % cream Apply topically 3 (three) times daily.  30 g  1   Review of Systems  Constitutional: Negative for unexpected weight change, or unusual diaphoresis  HENT: Negative for tinnitus.   Eyes: Negative for photophobia and visual disturbance.  Respiratory: Negative for choking and stridor.   Gastrointestinal: Negative for vomiting and  blood in stool.  Genitourinary: Negative for hematuria and decreased urine volume.  Musculoskeletal: Negative for acute joint swelling Skin: Negative for color change and wound.  Neurological: Negative for tremors and numbness other than noted  Psychiatric/Behavioral: Negative for decreased concentration or  hyperactivity.       Objective:   Physical Exam BP 132/80  Pulse 81  Temp 99 F (37.2 C) (Oral)  Ht 5' 2.5" (1.588 m)  Wt 133 lb 4 oz (60.442 kg)  BMI 23.98 kg/m2  SpO2 98% VS noted, not ill appearing Constitutional: Pt appears well-developed and well-nourished.  HENT: Head: NCAT.  Right Ear: External ear normal.  Left Ear: External ear normal.  Eyes: Conjunctivae and EOM are normal. Pupils are equal, round, and  reactive to light.  Neck: Normal range of motion. Neck supple.  Cardiovascular: Normal rate and regular rhythm.   Pulmonary/Chest: Effort normal and breath sounds normal.  - no rales or wheezing Abd:  Soft, NT, non-distended, + BS, benign  Neurological: Pt is alert. Not confused , motor intact Skin: Skin is warm. No erythema.  Psychiatric: Pt behavior is normal. Thought content normal.     Assessment & Plan:

## 2012-10-17 NOTE — Assessment & Plan Note (Signed)
stable overall by history and exam, recent data reviewed with pt, and pt to continue medical treatment as before,  to f/u any worsening symptoms or concerns BP Readings from Last 3 Encounters:  10/16/12 132/80  04/07/12 120/88  07/22/11 122/72

## 2012-10-21 ENCOUNTER — Encounter: Payer: Self-pay | Admitting: Internal Medicine

## 2012-10-21 ENCOUNTER — Telehealth: Payer: Self-pay | Admitting: Internal Medicine

## 2012-10-21 NOTE — Telephone Encounter (Signed)
Patient informed to pickup discount card at the front desk.

## 2012-10-21 NOTE — Telephone Encounter (Signed)
Per pt email  Yes, the pantoprazole is for stomach acid, is better if you take it every day, and would be in place of the prilosec (omeprazole).  I do have a discount card for onglyza I think may work - I will have robin call you to let you know and be able to pick up at the office.  If for some reason, the card does not work, we could look at [employer] or [employer] as alternatives that might be less expensive.  thanks ===View-only below this line===   ----- Message -----    From: [name]    Sent: 10/21/2012  9:31 AM EST      To: Oliver Barre, MD Subject: Visit Follow-Up Question  Hello Dr. Jonny Ruiz. 1- is the pantoprazole a med for me to take in place of another med.? or a mew med for  my intergestion? 2- on my onglyza am I to still take it to? if so do you have another one of those discounts card I can use? the cost is over $500.00.  Thanks so much...    Zella Ball to let pt know above, discount card on your desk

## 2012-10-26 ENCOUNTER — Encounter: Payer: Self-pay | Admitting: Internal Medicine

## 2012-11-30 ENCOUNTER — Other Ambulatory Visit: Payer: Self-pay | Admitting: Internal Medicine

## 2012-12-29 ENCOUNTER — Encounter: Payer: Self-pay | Admitting: Internal Medicine

## 2012-12-30 MED ORDER — GLIPIZIDE ER 5 MG PO TB24: 5.0000 mg | ORAL_TABLET | Freq: Every day | ORAL | Status: DC

## 2012-12-30 NOTE — Telephone Encounter (Signed)
Ok to try change to glipizide ER 5 mg qd - done erx   D/c onglyza

## 2013-01-26 ENCOUNTER — Encounter: Payer: Self-pay | Admitting: Internal Medicine

## 2013-01-26 DIAGNOSIS — M549 Dorsalgia, unspecified: Secondary | ICD-10-CM

## 2013-01-26 NOTE — Telephone Encounter (Signed)
Done per emr 

## 2013-03-02 ENCOUNTER — Encounter: Payer: Self-pay | Admitting: Internal Medicine

## 2013-03-18 ENCOUNTER — Encounter: Payer: Self-pay | Admitting: Internal Medicine

## 2013-03-18 ENCOUNTER — Ambulatory Visit: Payer: BC Managed Care – PPO | Admitting: Internal Medicine

## 2013-03-18 ENCOUNTER — Other Ambulatory Visit (INDEPENDENT_AMBULATORY_CARE_PROVIDER_SITE_OTHER): Payer: BC Managed Care – PPO

## 2013-03-18 ENCOUNTER — Ambulatory Visit (INDEPENDENT_AMBULATORY_CARE_PROVIDER_SITE_OTHER): Payer: BC Managed Care – PPO | Admitting: Internal Medicine

## 2013-03-18 VITALS — BP 102/80 | HR 77 | Temp 98.3°F | Ht 62.0 in | Wt 132.0 lb

## 2013-03-18 DIAGNOSIS — R198 Other specified symptoms and signs involving the digestive system and abdomen: Secondary | ICD-10-CM

## 2013-03-18 DIAGNOSIS — E119 Type 2 diabetes mellitus without complications: Secondary | ICD-10-CM

## 2013-03-18 DIAGNOSIS — Z0000 Encounter for general adult medical examination without abnormal findings: Secondary | ICD-10-CM

## 2013-03-18 DIAGNOSIS — R109 Unspecified abdominal pain: Secondary | ICD-10-CM

## 2013-03-18 HISTORY — DX: Other specified symptoms and signs involving the digestive system and abdomen: R19.8

## 2013-03-18 LAB — LIPID PANEL
HDL: 57.8 mg/dL (ref >39.00)
Total CHOL/HDL Ratio: 2

## 2013-03-18 LAB — HEPATIC FUNCTION PANEL
ALT: 13 U/L (ref 0–35)
Bilirubin, Direct: 0.1 mg/dL (ref 0.0–0.3)
Total Bilirubin: 0.5 mg/dL (ref 0.3–1.2)

## 2013-03-18 LAB — URINALYSIS, ROUTINE W REFLEX MICROSCOPIC
Bilirubin Urine: NEGATIVE
Hgb urine dipstick: NEGATIVE
Ketones, ur: NEGATIVE
Total Protein, Urine: NEGATIVE
Urine Glucose: NEGATIVE
pH: 6 (ref 5.0–8.0)

## 2013-03-18 LAB — CBC WITH DIFFERENTIAL/PLATELET
Basophils Relative: 1.1 % (ref 0.0–3.0)
Eosinophils Absolute: 0.2 10*3/uL (ref 0.0–0.7)
Eosinophils Relative: 2.4 % (ref 0.0–5.0)
HCT: 36.8 % (ref 36.0–46.0)
Lymphs Abs: 2.1 10*3/uL (ref 0.7–4.0)
MCHC: 32.6 g/dL (ref 30.0–36.0)
MCV: 83.1 fl (ref 78.0–100.0)
Monocytes Absolute: 0.5 10*3/uL (ref 0.1–1.0)
Neutro Abs: 6 10*3/uL (ref 1.4–7.7)
Neutrophils Relative %: 67.3 % (ref 43.0–77.0)
RBC: 4.43 Mil/uL (ref 3.87–5.11)

## 2013-03-18 LAB — BASIC METABOLIC PANEL
CO2: 30 mEq/L (ref 19–32)
Calcium: 9.1 mg/dL (ref 8.4–10.5)
GFR: 122.98 mL/min (ref >60.00)
Glucose, Bld: 84 mg/dL (ref 70–99)
Potassium: 3.6 mEq/L (ref 3.5–5.1)
Sodium: 141 mEq/L (ref 135–145)

## 2013-03-18 LAB — HEMOGLOBIN A1C: Hgb A1c MFr Bld: 5.8 % (ref 4.6–6.5)

## 2013-03-18 LAB — MICROALBUMIN / CREATININE URINE RATIO
Microalb Creat Ratio: 0.8 mg/g (ref 0.0–30.0)
Microalb, Ur: 0.8 mg/dL (ref 0.0–1.9)

## 2013-03-18 NOTE — Assessment & Plan Note (Signed)

## 2013-03-18 NOTE — Progress Notes (Signed)
Subjective:    Patient ID: Ruth Crawford, female    DOB: 1956-08-11, 57 y.o.   MRN: [id number]  HPI Here for wellness and f/u;  Overall doing ok;  Pt denies CP, worsening SOB, DOE, wheezing, orthopnea, PND, worsening LE edema, palpitations, dizziness or syncope.  Pt denies neurological change such as new headache, facial or extremity weakness.  Pt denies polydipsia, polyuria, or low sugar symptoms. Pt states overall good compliance with treatment and medications, good tolerability, and has been trying to follow lower cholesterol diet.  Pt denies worsening depressive symptoms, suicidal ideation or panic. No fever, night sweats, wt loss, loss of appetite, or other constitutional symptoms.  Pt states good ability with ADL's, has low fall risk, home safety reviewed and adequate, no other significant changes in hearing or vision, and only occasionally active with exercise.  Does incidentally have 10 days onset intermittent mild upper  And mid abd pain, crampy, "rolling" , gaseaous, phazyme seems to help, no n/v, severeal loose stools but no wt loss, blood.  Appetite in last 2-3 mo (persists in last 10 days) increased especially for sweets.  More stress lately , had a new grandbaby last wk, work is ok, still married with good marital relationship Past Medical History  Diagnosis Date  . DIABETES MELLITUS, TYPE II   . HYPERLIPIDEMIA   . ANXIETY   . DEPRESSION   . COMMON MIGRAINE   . HYPERTENSION   . POLYP, GALLBLADDER   . GERD (gastroesophageal reflux disease) 10/17/2012   Past Surgical History  Procedure Laterality Date  . Abdominal hysterectomy  2000  . Lumbar disc surgery  1998    s/p    reports that she has never smoked. She has never used smokeless tobacco. She reports that she does not drink alcohol or use illicit drugs. family history includes Alcohol abuse in her other; Arthritis in her other; Diabetes in her mother; Heart disease in her father; and Stroke in her other. No Known  Allergies Current Outpatient Prescriptions on File Prior to Visit  Medication Sig Dispense Refill  . aspirin 81 MG tablet Take 81 mg by mouth daily.        Marland Kitchen FLUoxetine (PROZAC) 20 MG capsule Take 1 capsule (20 mg total) by mouth daily.  90 capsule  3  . glipiZIDE (GLUCOTROL XL) 5 MG 24 hr tablet Take 1 tablet (5 mg total) by mouth daily.  90 tablet  3  . glucose blood (ACCU-CHEK ACTIVE STRIPS) test strip Use as directed once daily to check blood sugar.  100 each  11  . glucose blood (FREESTYLE LITE) test strip Use as instructed 1 once daily Use as instructed 1 once daily  100 each  11  . Lancets (FREESTYLE) lancets Use as instructed 1 once daily  100 each  11  . lisinopril (PRINIVIL,ZESTRIL) 10 MG tablet TAKE ONE TABLET BY MOUTH EVERY DAY  90 tablet  1  . lovastatin (MEVACOR) 40 MG tablet Take 1 tablet (40 mg total) by mouth daily.  90 tablet  3  . metFORMIN (GLUCOPHAGE-XR) 500 MG 24 hr tablet TAKE TWO TABLETS BY MOUTH TWICE DAILY  360 tablet  3  . pantoprazole (PROTONIX) 40 MG tablet Take 1 tablet (40 mg total) by mouth daily.  90 tablet  3  . SUMAtriptan (IMITREX) 100 MG tablet Take 1 tablet (100 mg total) by mouth every other day as needed.  10 tablet  11  . triamcinolone (KENALOG) 0.5 % cream Apply topically 3 (three) times  daily.  30 g  1   No current facility-administered medications on file prior to visit.   Review of Systems Constitutional: Negative for diaphoresis, activity change, appetite change or unexpected weight change.  HENT: Negative for hearing loss, ear pain, facial swelling, mouth sores and neck stiffness.   Eyes: Negative for pain, redness and visual disturbance.  Respiratory: Negative for shortness of breath and wheezing.   Cardiovascular: Negative for chest pain and palpitations.  Gastrointestinal: Negative for diarrhea, blood in stool, abdominal distention or other pain Genitourinary: Negative for hematuria, flank pain or change in urine volume.  Musculoskeletal:  Negative for myalgias and joint swelling.  Skin: Negative for color change and wound.  Neurological: Negative for syncope and numbness. other than noted Hematological: Negative for adenopathy.  Psychiatric/Behavioral: Negative for hallucinations, self-injury, decreased concentration and agitation.      Objective:   Physical Exam BP 102/80  Pulse 77  Temp(Src) 98.3 F (36.8 C) (Oral)  Ht 5\' 2"  (1.575 m)  Wt 132 lb (59.875 kg)  BMI 24.14 kg/m2  SpO2 96% VS noted,  Constitutional: Pt is oriented to person, place, and time. Appears well-developed and well-nourished.  Head: Normocephalic and atraumatic.  Right Ear: External ear normal.  Left Ear: External ear normal.  Nose: Nose normal.  Mouth/Throat: Oropharynx is clear and moist.  Eyes: Conjunctivae and EOM are normal. Pupils are equal, round, and reactive to light.  Neck: Normal range of motion. Neck supple. No JVD present. No tracheal deviation present.  Cardiovascular: Normal rate, regular rhythm, normal heart sounds and intact distal pulses.   Pulmonary/Chest: Effort normal and breath sounds normal.  Abdominal: Soft. Bowel sounds are normal. There is no tenderness. No HSM  Musculoskeletal: Normal range of motion. Exhibits no edema.  Lymphadenopathy:  Has no cervical adenopathy.  Neurological: Pt is alert and oriented to person, place, and time. Pt has normal reflexes. No cranial nerve deficit.  Skin: Skin is warm and dry. No rash noted.  Psychiatric:  Has  normal mood and affect. Behavior is normal. mild nervous noted    Assessment & Plan:

## 2013-03-18 NOTE — Assessment & Plan Note (Signed)
stable overall by history and exam, recent data reviewed with pt, and pt to continue medical treatment as before,  to f/u any worsening symptoms or concerns Lab Results  Component Value Date   HGBA1C 6.6* 10/15/2012

## 2013-03-18 NOTE — Patient Instructions (Addendum)
Please continue all other medications as before, and refills have been done if requested. Please have the pharmacy call with any other refills you may need. Please go to the LAB in the Basement (turn left off the elevator) for the tests to be done today You will be contacted by phone if any changes need to be made immediately.  Otherwise, you will receive a letter about your results with an explanation, but please check with MyChart first. Please continue your efforts at being more active, low cholesterol diet, and weight control. You are otherwise up to date with prevention measures today. Please keep your appointments with your specialists as you may have planned Please remember to followup with your GYN for the yearly pap smear and/or mammogram, consider [facility], and [facility] on [address]  Please remember to sign up for My Chart if you have not done so, as this will be important to you in the future with finding out test results, communicating by private email, and scheduling acute appointments online when needed.  Please return in 6 months, or sooner if needed, with Lab testing done 3-5 days before

## 2013-03-18 NOTE — Assessment & Plan Note (Signed)
Hx and exam bening, suspect functional issue, ? Related to stress, to continue to monitor for decr appetite, n/v, pain, blood, fever.

## 2013-03-22 ENCOUNTER — Other Ambulatory Visit: Payer: Self-pay | Admitting: Internal Medicine

## 2013-04-21 ENCOUNTER — Other Ambulatory Visit: Payer: Self-pay | Admitting: Internal Medicine

## 2013-06-28 ENCOUNTER — Encounter: Payer: Self-pay | Admitting: Internal Medicine

## 2013-06-28 DIAGNOSIS — J309 Allergic rhinitis, unspecified: Secondary | ICD-10-CM

## 2013-06-29 ENCOUNTER — Encounter: Payer: Self-pay | Admitting: Internal Medicine

## 2013-07-14 ENCOUNTER — Encounter: Payer: Self-pay | Admitting: Internal Medicine

## 2013-07-22 ENCOUNTER — Other Ambulatory Visit: Payer: Self-pay

## 2013-08-13 ENCOUNTER — Institutional Professional Consult (permissible substitution): Payer: BC Managed Care – PPO | Admitting: Internal Medicine

## 2013-08-20 ENCOUNTER — Ambulatory Visit (INDEPENDENT_AMBULATORY_CARE_PROVIDER_SITE_OTHER): Payer: BC Managed Care – PPO | Admitting: Internal Medicine

## 2013-08-20 ENCOUNTER — Other Ambulatory Visit (INDEPENDENT_AMBULATORY_CARE_PROVIDER_SITE_OTHER): Payer: BC Managed Care – PPO

## 2013-08-20 ENCOUNTER — Encounter: Payer: Self-pay | Admitting: Internal Medicine

## 2013-08-20 VITALS — BP 150/90 | HR 85 | Ht 60.5 in | Wt 130.2 lb

## 2013-08-20 DIAGNOSIS — J309 Allergic rhinitis, unspecified: Secondary | ICD-10-CM

## 2013-08-20 DIAGNOSIS — J302 Other seasonal allergic rhinitis: Secondary | ICD-10-CM

## 2013-08-20 DIAGNOSIS — H109 Unspecified conjunctivitis: Secondary | ICD-10-CM

## 2013-08-20 LAB — SEDIMENTATION RATE: Sed Rate: 23 mm/hr — ABNORMAL HIGH (ref 0–22)

## 2013-08-20 LAB — RHEUMATOID FACTOR: Rhuematoid fact SerPl-aCnc: <10 IU/mL (ref <=14)

## 2013-08-20 NOTE — Patient Instructions (Signed)
If you haven't seen an MD eye doctor (opthalmologist), then you will want to do so if your eyes keep bothering you.  Try otc eye lubricant gel, like Lacrilube  Order- lab- Allergy profile, Rheumatoid Factor, Sed rate, ANA     Dx conjunctivitis

## 2013-08-20 NOTE — Progress Notes (Signed)
08/20/13- 46 yoF never smoker Ref by Dr Oliver Barre - Eye redness and pain comes and goes - Some runny eyes - Denies itching - Occas Headaches - Denies sneezing She complains of red and painful eyes, persistent over 6 months but relieved by steroid eyedrops. Optometrist told her it might be allergy. She does not wear contact lenses. He improves 4 days or a week at a time but then comes back. No recent antihistamine. History of frequent sinus headaches. Spring and fall seasonal rhinitis treated over-the-counter. No history of asthma. Associates rash with anxiety. Denies any problems from lisinopril. Vision is okay. Denies arthritis. Environment: House with no basement, lactic heat, no mold. 2 dogs. No smokers. She is not working. Family history of allergy.  Prior to Admission medications   Medication Sig Start Date End Date Taking? Authorizing Provider  ALREX 0.2 % SUSP Use 1 drop in both eyes twice daily 06/18/13  Yes Historical Provider, MD  diphenhydrAMINE (BENADRYL) 25 MG tablet Take 25 mg by mouth daily as needed.   Yes Historical Provider, MD  FLUoxetine (PROZAC) 20 MG capsule TAKE ONE CAPSULE BY MOUTH EVERY DAY 04/21/13  Yes Corwin Levins, MD  glipiZIDE (GLUCOTROL XL) 5 MG 24 hr tablet Take 1 tablet (5 mg total) by mouth daily. 12/30/12  Yes Corwin Levins, MD  glucose blood (ACCU-CHEK ACTIVE STRIPS) test strip Use as directed once daily to check blood sugar. 04/20/12  Yes Corwin Levins, MD  glucose blood (FREESTYLE LITE) test strip Use as instructed 1 once daily Use as instructed 1 once daily 04/07/12  Yes Corwin Levins, MD  Lancets (FREESTYLE) lancets Use as instructed 1 once daily 04/07/12  Yes Corwin Levins, MD  lisinopril (PRINIVIL,ZESTRIL) 10 MG tablet TAKE ONE TABLET BY MOUTH EVERY DAY 03/22/13  Yes Corwin Levins, MD  lovastatin (MEVACOR) 40 MG tablet Take 1 tablet (40 mg total) by mouth daily. 09/23/12  Yes Corwin Levins, MD  metFORMIN (GLUCOPHAGE-XR) 500 MG 24 hr tablet TAKE TWO TABLETS BY MOUTH TWICE DAILY  04/07/12  Yes Corwin Levins, MD  SUMAtriptan (IMITREX) 100 MG tablet Take 1 tablet (100 mg total) by mouth every other day as needed. 04/07/12  Yes Corwin Levins, MD  aspirin 81 MG tablet Take 81 mg by mouth daily.      Historical Provider, MD  metFORMIN (GLUCOPHAGE-XR) 500 MG 24 hr tablet TAKE TWO TABLETS BY MOUTH TWICE DAILY 09/11/13   Corwin Levins, MD   Past Medical History  Diagnosis Date  . DIABETES MELLITUS, TYPE II   . HYPERLIPIDEMIA   . ANXIETY   . DEPRESSION   . COMMON MIGRAINE   . HYPERTENSION   . POLYP, GALLBLADDER   . GERD (gastroesophageal reflux disease) 10/17/2012   Past Surgical History  Procedure Laterality Date  . Abdominal hysterectomy  2000  . Lumbar disc surgery  1998    s/p   Family History  Problem Relation Age of Onset  . Diabetes Mother   . Heart disease Father   . Stroke Other   . Alcohol abuse Other   . Arthritis Other    History   Social History  . Marital Status: Married    Spouse Name: N/A    Number of Children: 1  . Years of Education: N/A   Occupational History  . billing clerk     at [employer]   Social History Main Topics  . Smoking status: Never Smoker   . Smokeless  tobacco: Never Used  . Alcohol Use: No  . Drug Use: No  . Sexual Activity:    Other Topics Concern  . Not on file   Social History Narrative  . No narrative on file   ROS-see HPI Constitutional:   No-   weight loss, night sweats, fevers, chills, fatigue, lassitude. HEENT:   +  headaches, no-difficulty swallowing, tooth/dental problems, sore throat,       No-  sneezing, +itching, no-ear ache, +nasal congestion, post nasal drip,  CV:  No-   chest pain, orthopnea, PND, swelling in lower extremities, anasarca, dizziness, palpitations Resp: No-   shortness of breath with exertion or at rest.              No-   productive cough,  No non-productive cough,  No- coughing up of blood.              No-   change in color of mucus.  No- wheezing.   Skin: +occasional  rash . GI:  No-   heartburn, indigestion, abdominal pain, nausea, vomiting, diarrhea,                 change in bowel habits, loss of appetite GU: No-   dysuria, change in color of urine, no urgency or frequency.  No- flank pain. MS:  No-   joint pain or swelling.  No- decreased range of motion.  No- back pain. Neuro-     nothing unusual Psych:  No- change in mood or affect. No depression or anxiety.  No memory loss.  OBJ- Physical Exam General- Alert, Oriented, Affect-appropriate, Distress- none acute Skin- rash-none, lesions- none, excoriation- none Lymphadenopathy- none Head- atraumatic            Eyes- Gross vision intact, PERRLA, conjunctivae red/ injected            Ears- Hearing, canals-normal            Nose- Clear, no-Septal dev, mucus, polyps, erosion, perforation             Throat- Mallampati II , mucosa clear , drainage- none, tonsils- atrophic Neck- flexible , trachea midline, no stridor , thyroid nl, carotid no bruit Chest - symmetrical excursion , unlabored           Heart/CV- RRR , no murmur , no gallop  , no rub, nl s1 s2                           - JVD- none , edema- none, stasis changes- none, varices- none           Lung- clear to P&A, wheeze- none, cough- none , dullness-none, rub- none           Chest wall-  Abd- tender-no, distended-no, bowel sounds-present, HSM- no Br/ Gen/ Rectal- Not done, not indicated Extrem- cyanosis- none, clubbing, none, atrophy- none, strength- nl Neuro- grossly intact to observation

## 2013-08-23 LAB — ANA: Anti Nuclear Antibody(ANA): NEGATIVE

## 2013-08-23 LAB — ALLERGY FULL PROFILE
Allergen,Goose feathers, e70: <0.1 kU/L
Aspergillus fumigatus, m3: 1.31 kU/L — ABNORMAL HIGH
Bermuda Grass: 3.89 kU/L — ABNORMAL HIGH
Candida Albicans: 4.13 kU/L — ABNORMAL HIGH
Curvularia lunata: 1.46 kU/L — ABNORMAL HIGH
D. farinae: 0.78 kU/L — ABNORMAL HIGH
Dog Dander: 0.25 kU/L — ABNORMAL HIGH
IgE (Immunoglobulin E), Serum: 353.1 IU/mL — ABNORMAL HIGH (ref 0.0–180.0)
Lamb's Quarters: 0.18 kU/L — ABNORMAL HIGH
Oak: <0.1 kU/L
Plantain: <0.1 kU/L
Stemphylium Botryosum: 2.48 kU/L — ABNORMAL HIGH
Timothy Grass: 1.38 kU/L — ABNORMAL HIGH

## 2013-08-25 ENCOUNTER — Encounter: Payer: Self-pay | Admitting: Internal Medicine

## 2013-08-25 NOTE — Progress Notes (Signed)
Quick Note:  Pt aware of results via phone call; released to MyChart-pt to send email back that she received the results. ______

## 2013-09-11 ENCOUNTER — Other Ambulatory Visit: Payer: Self-pay | Admitting: Internal Medicine

## 2013-09-12 ENCOUNTER — Encounter: Payer: Self-pay | Admitting: Internal Medicine

## 2013-09-12 DIAGNOSIS — J302 Other seasonal allergic rhinitis: Secondary | ICD-10-CM | POA: Insufficient documentation

## 2013-09-12 DIAGNOSIS — H109 Unspecified conjunctivitis: Secondary | ICD-10-CM | POA: Insufficient documentation

## 2013-09-12 HISTORY — DX: Other seasonal allergic rhinitis: J30.2

## 2013-09-12 NOTE — Assessment & Plan Note (Signed)
Use OTC antihistamines if sufficient

## 2013-09-12 NOTE — Assessment & Plan Note (Signed)
Onset of persistent conjunctivitis 6 months ago, temporarily relieved by topical steroids. I suggested she see an ophthalmologist. We can look for indications of allergy and vasculitis. Differential includes ocular rosacea and many other possibilities Plan-lab for allergy profile, rheumatoid factor, sedimentation rate, ANA. Try lubricating ointment like Lacrilube for comfort

## 2013-09-17 LAB — HM MAMMOGRAPHY

## 2013-09-20 ENCOUNTER — Ambulatory Visit: Payer: BC Managed Care – PPO | Admitting: Internal Medicine

## 2013-09-22 ENCOUNTER — Other Ambulatory Visit (INDEPENDENT_AMBULATORY_CARE_PROVIDER_SITE_OTHER): Payer: BC Managed Care – PPO

## 2013-09-22 ENCOUNTER — Other Ambulatory Visit: Payer: Self-pay | Admitting: Internal Medicine

## 2013-09-22 ENCOUNTER — Encounter: Payer: Self-pay | Admitting: Internal Medicine

## 2013-09-22 ENCOUNTER — Ambulatory Visit (INDEPENDENT_AMBULATORY_CARE_PROVIDER_SITE_OTHER): Payer: BC Managed Care – PPO | Admitting: Internal Medicine

## 2013-09-22 VITALS — BP 132/86 | HR 83 | Temp 98.0°F | Resp 10 | Wt 134.0 lb

## 2013-09-22 DIAGNOSIS — E785 Hyperlipidemia, unspecified: Secondary | ICD-10-CM

## 2013-09-22 DIAGNOSIS — E119 Type 2 diabetes mellitus without complications: Secondary | ICD-10-CM

## 2013-09-22 DIAGNOSIS — Z1231 Encounter for screening mammogram for malignant neoplasm of breast: Secondary | ICD-10-CM

## 2013-09-22 DIAGNOSIS — I10 Essential (primary) hypertension: Secondary | ICD-10-CM

## 2013-09-22 LAB — HEPATIC FUNCTION PANEL
ALBUMIN: 4.3 g/dL (ref 3.5–5.2)
ALK PHOS: 66 U/L (ref 39–117)
ALT: 12 U/L (ref 0–35)
AST: 13 U/L (ref 0–37)
Bilirubin, Direct: 0.1 mg/dL (ref 0.0–0.3)
Total Bilirubin: 0.5 mg/dL (ref 0.3–1.2)
Total Protein: 7.6 g/dL (ref 6.0–8.3)

## 2013-09-22 LAB — LIPID PANEL
CHOLESTEROL: 142 mg/dL (ref 0–200)
HDL: 57.7 mg/dL (ref >39.00)
LDL Cholesterol: 64 mg/dL (ref 0–99)
Total CHOL/HDL Ratio: 2
Triglycerides: 103 mg/dL (ref 0.0–149.0)
VLDL: 20.6 mg/dL (ref 0.0–40.0)

## 2013-09-22 LAB — BASIC METABOLIC PANEL
BUN: 13 mg/dL (ref 6–23)
CO2: 29 meq/L (ref 19–32)
Calcium: 9.3 mg/dL (ref 8.4–10.5)
Chloride: 104 mEq/L (ref 96–112)
Creatinine, Ser: 0.5 mg/dL (ref 0.4–1.2)
GFR: 149.34 mL/min (ref >60.00)
GLUCOSE: 84 mg/dL (ref 70–99)
POTASSIUM: 3.8 meq/L (ref 3.5–5.1)
SODIUM: 140 meq/L (ref 135–145)

## 2013-09-22 LAB — HEMOGLOBIN A1C: HEMOGLOBIN A1C: 6.4 % (ref 4.6–6.5)

## 2013-09-22 MED ORDER — GLIPIZIDE ER 2.5 MG PO TB24: 2.5000 mg | ORAL_TABLET | Freq: Every day | ORAL | Status: DC

## 2013-09-22 MED ORDER — LOVASTATIN 40 MG PO TABS: 40.0000 mg | ORAL_TABLET | Freq: Every day | ORAL | Status: DC

## 2013-09-22 NOTE — Assessment & Plan Note (Signed)
stable overall by history and exam, recent data reviewed with pt, and pt to continue medical treatment as before,  to f/u any worsening symptoms or concerns BP Readings from Last 3 Encounters:  09/22/13 132/86  08/20/13 150/90  03/18/13 102/80

## 2013-09-22 NOTE — Patient Instructions (Signed)
OK to decrease the glipizide to 2.5 mg per day Please continue all other medications as before, and refills have been done if requested. Please have the pharmacy call with any other refills you may need. Please go to the LAB in the Basement (turn left off the elevator) for the tests to be done today You will be contacted by phone if any changes need to be made immediately.  Otherwise, you will receive a letter about your results with an explanation, but please check with MyChart first.  Please remember to sign up for My Chart if you have not done so, as this will be important to you in the future with finding out test results, communicating by private email, and scheduling acute appointments online when needed.  Please return in 6 months, or sooner if needed

## 2013-09-22 NOTE — Assessment & Plan Note (Signed)
stable overall by history and exam, recent data reviewed with pt, and pt to continue medical treatment as before,  to f/u any worsening symptoms or concerns Lab Results  Component Value Date   LDLCALC 45 03/18/2013

## 2013-09-22 NOTE — Progress Notes (Signed)
Subjective:    Patient ID: Ruth Crawford, female    DOB: 04-Nov-1955, 58 y.o.   MRN: [id number]  HPI  Here to f/u; overall doing ok,  Pt denies chest pain, increased sob or doe, wheezing, orthopnea, PND, increased LE swelling, palpitations, dizziness or syncope.  Pt denies polydipsia, polyuria, or low sugar symptoms such as weakness or confusion improved with po intake except for one day with late lunch and had low sugar, husband gave her food.  Pt denies new neurological symptoms such as new headache, or facial or extremity weakness or numbness.   Pt states overall good compliance with meds, has been trying to follow lower cholesterol, diabetic diet, with wt overall stable,  but little exercise however.  Also without sexual desire, no marital conflicts. No obvious other complaints Past Medical History  Diagnosis Date  . DIABETES MELLITUS, TYPE II   . HYPERLIPIDEMIA   . ANXIETY   . DEPRESSION   . COMMON MIGRAINE   . HYPERTENSION   . POLYP, GALLBLADDER   . GERD (gastroesophageal reflux disease) 10/17/2012   Past Surgical History  Procedure Laterality Date  . Abdominal hysterectomy  2000  . Lumbar disc surgery  1998    s/p    reports that she has never smoked. She has never used smokeless tobacco. She reports that she does not drink alcohol or use illicit drugs. family history includes Alcohol abuse in her other; Arthritis in her other; Diabetes in her mother; Heart disease in her father; Stroke in her other. No Known Allergies Current Outpatient Prescriptions on File Prior to Visit  Medication Sig Dispense Refill  . ALREX 0.2 % SUSP Use 1 drop in both eyes twice daily      . aspirin 81 MG tablet Take 81 mg by mouth daily.        . diphenhydrAMINE (BENADRYL) 25 MG tablet Take 25 mg by mouth daily as needed.      Marland Kitchen FLUoxetine (PROZAC) 20 MG capsule TAKE ONE CAPSULE BY MOUTH EVERY DAY  90 capsule  3  . glucose blood (ACCU-CHEK ACTIVE STRIPS) test strip Use as directed once daily to check  blood sugar.  100 each  11  . glucose blood (FREESTYLE LITE) test strip Use as instructed 1 once daily Use as instructed 1 once daily  100 each  11  . Lancets (FREESTYLE) lancets Use as instructed 1 once daily  100 each  11  . lisinopril (PRINIVIL,ZESTRIL) 10 MG tablet TAKE ONE TABLET BY MOUTH EVERY DAY  90 tablet  3  . metFORMIN (GLUCOPHAGE-XR) 500 MG 24 hr tablet TAKE TWO TABLETS BY MOUTH TWICE DAILY  360 tablet  3  . SUMAtriptan (IMITREX) 100 MG tablet Take 1 tablet (100 mg total) by mouth every other day as needed.  10 tablet  11   No current facility-administered medications on file prior to visit.   Review of Systems  Constitutional: Negative for unexpected weight change, or unusual diaphoresis  HENT: Negative for tinnitus.   Eyes: Negative for photophobia and visual disturbance.  Respiratory: Negative for choking and stridor.   Gastrointestinal: Negative for vomiting and blood in stool.  Genitourinary: Negative for hematuria and decreased urine volume.  Musculoskeletal: Negative for acute joint swelling Skin: Negative for color change and wound.  Neurological: Negative for tremors and numbness other than noted  Psychiatric/Behavioral: Negative for decreased concentration or  hyperactivity.       Objective:   Physical Exam BP 132/86  Pulse 83  Temp(Src)  98 F (36.7 C) (Oral)  Resp 10  Wt 134 lb (60.782 kg)  SpO2 98% VS noted,  Constitutional: Pt appears well-developed and well-nourished.  HENT: Head: NCAT.  Right Ear: External ear normal.  Left Ear: External ear normal.  Eyes: Conjunctivae and EOM are normal. Pupils are equal, round, and reactive to light.  Neck: Normal range of motion. Neck supple.  Cardiovascular: Normal rate and regular rhythm.   Pulmonary/Chest: Effort normal and breath sounds normal.  Neurological: Pt is alert. Not confused  Skin: Skin is warm. No erythema.  Psychiatric: Pt behavior is normal. Thought content normal.     Assessment & Plan:

## 2013-09-22 NOTE — Assessment & Plan Note (Addendum)
stable overall by history and exam, recent data reviewed with pt, and pt to continue medical treatment as before except for glipizide ER 2.5 qd,  to f/u any worsening symptoms or concerns Lab Results  Component Value Date   LDLCALC 45 03/18/2013

## 2013-09-29 ENCOUNTER — Ambulatory Visit (HOSPITAL_BASED_OUTPATIENT_CLINIC_OR_DEPARTMENT_OTHER): Payer: BC Managed Care – PPO

## 2013-10-01 ENCOUNTER — Other Ambulatory Visit: Payer: BC Managed Care – PPO

## 2013-10-01 ENCOUNTER — Encounter: Payer: Self-pay | Admitting: Internal Medicine

## 2013-10-01 ENCOUNTER — Ambulatory Visit (HOSPITAL_BASED_OUTPATIENT_CLINIC_OR_DEPARTMENT_OTHER): Payer: BC Managed Care – PPO

## 2013-10-01 ENCOUNTER — Ambulatory Visit (HOSPITAL_BASED_OUTPATIENT_CLINIC_OR_DEPARTMENT_OTHER)
Admission: RE | Admit: 2013-10-01 | Discharge: 2013-10-01 | Disposition: A | Discharge status: Home / Self Care | Payer: BC Managed Care – PPO | Source: Ambulatory Visit | Attending: Internal Medicine | Admitting: Internal Medicine

## 2013-10-01 ENCOUNTER — Ambulatory Visit (INDEPENDENT_AMBULATORY_CARE_PROVIDER_SITE_OTHER): Payer: BC Managed Care – PPO | Admitting: Internal Medicine

## 2013-10-01 VITALS — BP 140/98 | HR 84 | Ht 60.5 in | Wt 136.0 lb

## 2013-10-01 DIAGNOSIS — J302 Other seasonal allergic rhinitis: Secondary | ICD-10-CM

## 2013-10-01 DIAGNOSIS — L299 Pruritus, unspecified: Secondary | ICD-10-CM

## 2013-10-01 DIAGNOSIS — Z1231 Encounter for screening mammogram for malignant neoplasm of breast: Secondary | ICD-10-CM | POA: Insufficient documentation

## 2013-10-01 DIAGNOSIS — J3089 Other allergic rhinitis: Secondary | ICD-10-CM

## 2013-10-01 DIAGNOSIS — H109 Unspecified conjunctivitis: Secondary | ICD-10-CM

## 2013-10-01 DIAGNOSIS — J309 Allergic rhinitis, unspecified: Secondary | ICD-10-CM

## 2013-10-01 NOTE — Progress Notes (Signed)
08/20/13- [age] yoF never smoker Ref by Dr Oliver BarreJames John - Eye redness and pain comes and goes - Some runny eyes - Denies itching - Occas Headaches - Denies sneezing She complains of red and painful eyes, persistent over 6 months but relieved by steroid eyedrops. Optometrist told her it might be allergy. She does not wear contact lenses. She improves 4 days or a week at a time but then comes back. No recent antihistamine. History of frequent sinus headaches. Spring and fall seasonal rhinitis treated over-the-counter. No history of asthma. Associates rash with anxiety. Denies any problems from lisinopril. Vision is okay. Denies arthritis. Environment: House with no basement, lactic heat, no mold. 2 dogs. No smokers. She is not working. Family history of allergy.  10/01/13-  [age] yoF never smoker Ref by Dr Oliver BarreJames John - Eye redness and pain comes and goes - Some runny eyes - Denies itching - Occas Headaches - Denies sneezing FOLLOWS FOR: States that her allergy sx have not changed since last OV. She did not try lubricating gel for her eyes. Still has the same intermittent burning and watering.antihistamines and various nasal sprays have not helped much.  Now describes seasonal rhinitis since childhood. Generalized itching without visible rash intermittently since her eyes began bothering her.  Lab 08/20/2013-  ANA negative, sedimentation rate only 23, Allergy Profile 12//14- total IgE 353.1, broadly positive. We discussed a trial of allergy vaccine based on this.  ROS-see HPI Constitutional:   No-   weight loss, night sweats, fevers, chills, fatigue, lassitude. HEENT:   +  headaches, no-difficulty swallowing, tooth/dental problems, sore throat,       No-  sneezing, +itching, no-ear ache, +nasal congestion, post nasal drip,  CV:  No-   chest pain, orthopnea, PND, swelling in lower extremities, anasarca, dizziness, palpitations Resp: No-   shortness of breath with exertion or at rest.              No-    productive cough,  No non-productive cough,  No- coughing up of blood.              No-   change in color of mucus.  No- wheezing.   Skin: +occasional rash . GI:  No-   heartburn, indigestion, abdominal pain, nausea, vomiting,  GU:  MS:  No-   joint pain or swelling.  No- decreased range of motion.  Neuro-     nothing unusual Psych:  No- change in mood or affect. No depression or anxiety.  No memory loss.  OBJ- Physical Exam General- Alert, Oriented, Affect-appropriate, Distress- none acute Skin- rash-none, lesions- none, excoriation- none Lymphadenopathy- none Head- atraumatic            Eyes- Gross vision intact, PERRLA, conjunctivae+ mildly red/ injected            Ears- Hearing, canals-normal            Nose- Clear, no-Septal dev, mucus, polyps, erosion, perforation             Throat- Mallampati II , mucosa clear , drainage- none, tonsils- atrophic Neck- flexible , trachea midline, no stridor , thyroid nl, carotid no bruit Chest - symmetrical excursion , unlabored           Heart/CV- RRR , no murmur , no gallop  , no rub, nl s1 s2                           -  JVD- none , edema- none, stasis changes- none, varices- none           Lung- clear to P&A, wheeze- none, cough- none , dullness-none, rub- none           Chest wall-  Abd-  Br/ Gen/ Rectal- Not done, not indicated Extrem- cyanosis- none, clubbing, none, atrophy- none, strength- nl Neuro- grossly intact to observation

## 2013-10-01 NOTE — Patient Instructions (Signed)
Order- lab ACE level  Dx conjunctivitis   We will build an allergy vaccine based on your lab results, and the Allergy Lab will contact you when they are ready to start.

## 2013-10-01 NOTE — Assessment & Plan Note (Addendum)
Allergy Profile 12//14- total IgE 353.1, broadly positive. We discussed a trial of allergy vaccine based on this She has not responded to antihistamines and Alerx eyedrops have not done enough. Plan-we will do an allergy vaccine based on her in vitro testing. ACE to exclude sarcoid. Is he

## 2013-10-01 NOTE — Assessment & Plan Note (Signed)
Allergy Profile 12//14- total IgE 353.1, broadly positive. We discussed a trial of allergy vaccine based on this

## 2013-10-01 NOTE — Assessment & Plan Note (Signed)
Nonspecific pruritus seems to correlate better with her conjunctivitis and rhinitis than with other factors.

## 2013-10-02 LAB — ANGIOTENSIN CONVERTING ENZYME: Angiotensin-Converting Enzyme: 11 U/L (ref 8–52)

## 2013-10-04 ENCOUNTER — Ambulatory Visit (INDEPENDENT_AMBULATORY_CARE_PROVIDER_SITE_OTHER): Payer: BC Managed Care – PPO | Admitting: Internal Medicine

## 2013-10-04 DIAGNOSIS — J309 Allergic rhinitis, unspecified: Secondary | ICD-10-CM

## 2013-10-08 ENCOUNTER — Other Ambulatory Visit: Payer: Self-pay

## 2013-10-08 ENCOUNTER — Ambulatory Visit (INDEPENDENT_AMBULATORY_CARE_PROVIDER_SITE_OTHER): Payer: BC Managed Care – PPO

## 2013-10-08 DIAGNOSIS — J309 Allergic rhinitis, unspecified: Secondary | ICD-10-CM

## 2013-10-08 MED ORDER — EPINEPHRINE 0.3 MG/0.3ML IJ SOAJ: 0.3000 mg | Freq: Once | INTRAMUSCULAR | Status: DC

## 2013-10-11 ENCOUNTER — Ambulatory Visit (INDEPENDENT_AMBULATORY_CARE_PROVIDER_SITE_OTHER): Payer: BC Managed Care – PPO

## 2013-10-11 DIAGNOSIS — J309 Allergic rhinitis, unspecified: Secondary | ICD-10-CM

## 2013-10-15 ENCOUNTER — Ambulatory Visit (INDEPENDENT_AMBULATORY_CARE_PROVIDER_SITE_OTHER): Payer: BC Managed Care – PPO

## 2013-10-15 DIAGNOSIS — J309 Allergic rhinitis, unspecified: Secondary | ICD-10-CM

## 2013-10-18 ENCOUNTER — Ambulatory Visit (INDEPENDENT_AMBULATORY_CARE_PROVIDER_SITE_OTHER): Payer: BC Managed Care – PPO

## 2013-10-18 DIAGNOSIS — J309 Allergic rhinitis, unspecified: Secondary | ICD-10-CM

## 2013-10-21 ENCOUNTER — Encounter: Payer: Self-pay | Admitting: Internal Medicine

## 2013-10-22 ENCOUNTER — Ambulatory Visit (INDEPENDENT_AMBULATORY_CARE_PROVIDER_SITE_OTHER): Payer: BC Managed Care – PPO

## 2013-10-22 DIAGNOSIS — J309 Allergic rhinitis, unspecified: Secondary | ICD-10-CM

## 2013-10-25 ENCOUNTER — Ambulatory Visit (INDEPENDENT_AMBULATORY_CARE_PROVIDER_SITE_OTHER): Payer: BC Managed Care – PPO

## 2013-10-25 DIAGNOSIS — J309 Allergic rhinitis, unspecified: Secondary | ICD-10-CM

## 2013-10-26 ENCOUNTER — Ambulatory Visit: Payer: BC Managed Care – PPO

## 2013-10-29 ENCOUNTER — Ambulatory Visit (INDEPENDENT_AMBULATORY_CARE_PROVIDER_SITE_OTHER): Payer: BC Managed Care – PPO

## 2013-10-29 DIAGNOSIS — J309 Allergic rhinitis, unspecified: Secondary | ICD-10-CM

## 2013-11-01 ENCOUNTER — Ambulatory Visit (INDEPENDENT_AMBULATORY_CARE_PROVIDER_SITE_OTHER): Payer: BC Managed Care – PPO

## 2013-11-01 DIAGNOSIS — J309 Allergic rhinitis, unspecified: Secondary | ICD-10-CM

## 2013-11-05 ENCOUNTER — Ambulatory Visit (INDEPENDENT_AMBULATORY_CARE_PROVIDER_SITE_OTHER): Payer: BC Managed Care – PPO

## 2013-11-05 DIAGNOSIS — J309 Allergic rhinitis, unspecified: Secondary | ICD-10-CM

## 2013-11-08 ENCOUNTER — Ambulatory Visit (INDEPENDENT_AMBULATORY_CARE_PROVIDER_SITE_OTHER): Payer: BC Managed Care – PPO

## 2013-11-08 DIAGNOSIS — J309 Allergic rhinitis, unspecified: Secondary | ICD-10-CM

## 2013-11-12 ENCOUNTER — Ambulatory Visit (INDEPENDENT_AMBULATORY_CARE_PROVIDER_SITE_OTHER): Payer: BC Managed Care – PPO

## 2013-11-12 DIAGNOSIS — J309 Allergic rhinitis, unspecified: Secondary | ICD-10-CM

## 2013-11-15 ENCOUNTER — Ambulatory Visit (INDEPENDENT_AMBULATORY_CARE_PROVIDER_SITE_OTHER): Payer: BC Managed Care – PPO

## 2013-11-15 DIAGNOSIS — J309 Allergic rhinitis, unspecified: Secondary | ICD-10-CM

## 2013-11-16 ENCOUNTER — Ambulatory Visit (INDEPENDENT_AMBULATORY_CARE_PROVIDER_SITE_OTHER): Payer: BC Managed Care – PPO

## 2013-11-16 DIAGNOSIS — J309 Allergic rhinitis, unspecified: Secondary | ICD-10-CM

## 2013-11-19 ENCOUNTER — Ambulatory Visit (INDEPENDENT_AMBULATORY_CARE_PROVIDER_SITE_OTHER): Payer: BC Managed Care – PPO

## 2013-11-19 DIAGNOSIS — J309 Allergic rhinitis, unspecified: Secondary | ICD-10-CM

## 2013-11-22 ENCOUNTER — Ambulatory Visit (INDEPENDENT_AMBULATORY_CARE_PROVIDER_SITE_OTHER): Payer: BC Managed Care – PPO

## 2013-11-22 DIAGNOSIS — J309 Allergic rhinitis, unspecified: Secondary | ICD-10-CM

## 2013-11-25 ENCOUNTER — Ambulatory Visit: Payer: BC Managed Care – PPO

## 2013-11-26 ENCOUNTER — Ambulatory Visit (INDEPENDENT_AMBULATORY_CARE_PROVIDER_SITE_OTHER): Payer: BC Managed Care – PPO

## 2013-11-26 DIAGNOSIS — J309 Allergic rhinitis, unspecified: Secondary | ICD-10-CM

## 2013-11-29 ENCOUNTER — Ambulatory Visit (INDEPENDENT_AMBULATORY_CARE_PROVIDER_SITE_OTHER): Payer: BC Managed Care – PPO

## 2013-11-29 DIAGNOSIS — J309 Allergic rhinitis, unspecified: Secondary | ICD-10-CM

## 2013-12-03 ENCOUNTER — Ambulatory Visit: Payer: BC Managed Care – PPO | Admitting: Internal Medicine

## 2013-12-06 ENCOUNTER — Ambulatory Visit (INDEPENDENT_AMBULATORY_CARE_PROVIDER_SITE_OTHER): Payer: BC Managed Care – PPO

## 2013-12-06 DIAGNOSIS — J309 Allergic rhinitis, unspecified: Secondary | ICD-10-CM

## 2013-12-07 ENCOUNTER — Encounter: Payer: Self-pay | Admitting: Internal Medicine

## 2013-12-07 ENCOUNTER — Ambulatory Visit (INDEPENDENT_AMBULATORY_CARE_PROVIDER_SITE_OTHER): Payer: BC Managed Care – PPO | Admitting: Internal Medicine

## 2013-12-07 VITALS — BP 132/80 | HR 77 | Ht 60.5 in | Wt 132.6 lb

## 2013-12-07 DIAGNOSIS — Z91018 Allergy to other foods: Secondary | ICD-10-CM

## 2013-12-07 DIAGNOSIS — J3089 Other allergic rhinitis: Secondary | ICD-10-CM

## 2013-12-07 DIAGNOSIS — J302 Other seasonal allergic rhinitis: Secondary | ICD-10-CM

## 2013-12-07 DIAGNOSIS — J309 Allergic rhinitis, unspecified: Secondary | ICD-10-CM

## 2013-12-07 DIAGNOSIS — H1010 Acute atopic conjunctivitis, unspecified eye: Secondary | ICD-10-CM

## 2013-12-07 DIAGNOSIS — H1045 Other chronic allergic conjunctivitis: Secondary | ICD-10-CM

## 2013-12-07 NOTE — Progress Notes (Signed)
08/20/13- [age] yoF never smoker Ref by Dr Oliver BarreJames John - Eye redness and pain comes and goes - Some runny eyes - Denies itching - Occas Headaches - Denies sneezing She complains of red and painful eyes, persistent over 6 months but relieved by steroid eyedrops. Optometrist told her it might be allergy. She does not wear contact lenses. She improves 4 days or a week at a time but then comes back. No recent antihistamine. History of frequent sinus headaches. Spring and fall seasonal rhinitis treated over-the-counter. No history of asthma. Associates rash with anxiety. Denies any problems from lisinopril. Vision is okay. Denies arthritis. Environment: House with no basement, lactic heat, no mold. 2 dogs. No smokers. She is not working. Family history of allergy.  10/01/13-  [age] yoF never smoker Ref by Dr Oliver BarreJames John - Eye redness and pain comes and goes - Some runny eyes - Denies itching - Occas Headaches - Denies sneezing FOLLOWS FOR: States that her allergy sx have not changed since last OV. She did not try lubricating gel for her eyes. Still has the same intermittent burning and watering.antihistamines and various nasal sprays have not helped much.  Now describes seasonal rhinitis since childhood. Generalized itching without visible rash intermittently since her eyes began bothering her.  Lab 08/20/2013-  ANA negative, sedimentation rate only 23, Allergy Profile 12//14- total IgE 353.1, broadly positive. We discussed a trial of allergy vaccine based on this.  12/07/13- 57 yoF never smoker followed for allergic conjunctivitis, allergic rhinitis, food allergy-banana, watermelon FOLLOWS FOR: doing well overall with allergies,still taking allergy shot, ha occass., nasal congestion yesterday-clear,no cough,denies wheezing Allergy vaccine 1:5000 build-up here Not much conjunctivitis so far this spring. Occasional Benadryl. Minor hacking cough in the mornings but no wheeze. Banana and watermelon make her throat   itch  ROS-see HPI Constitutional:   No-   weight loss, night sweats, fevers, chills, fatigue, lassitude. HEENT:   +  headaches, no-difficulty swallowing, tooth/dental problems, sore throat,       No-  sneezing, +itching, no-ear ache, +nasal congestion, post nasal drip,  CV:  No-   chest pain, orthopnea, PND, swelling in lower extremities, anasarca, dizziness, palpitations Resp: No-   shortness of breath with exertion or at rest.              No-   productive cough,  No non-productive cough,  No- coughing up of blood.              No-   change in color of mucus.  No- wheezing.   Skin: +occasional rash . GI:  No-   heartburn, indigestion, abdominal pain, nausea, vomiting,  GU:  MS:  No-   joint pain or swelling.  No- decreased range of motion.  Neuro-     nothing unusual Psych:  No- change in mood or affect. No depression or anxiety.  No memory loss.  OBJ- Physical Exam General- Alert, Oriented, Affect-appropriate, Distress- none acute Skin- rash-none, lesions- none, excoriation- none Lymphadenopathy- none Head- atraumatic            Eyes- Gross vision intact, PERRLA, conjunctivae-not red/ injected            Ears- Hearing, canals-normal            Nose- Clear, no-Septal dev, mucus, polyps, erosion, perforation             Throat- Mallampati II , mucosa clear , drainage- none, tonsils- atrophic Neck- flexible , trachea midline, no stridor ,  thyroid nl, carotid no bruit Chest - symmetrical excursion , unlabored           Heart/CV- RRR , no murmur , no gallop  , no rub, nl s1 s2                           - JVD- none , edema- none, stasis changes- none, varices- none           Lung- clear to P&A, wheeze- none, cough- none , dullness-none, rub- none           Chest wall-  Abd-  Br/ Gen/ Rectal- Not done, not indicated Extrem- cyanosis- none, clubbing, none, atrophy- none, strength- nl Neuro- grossly intact to observation

## 2013-12-07 NOTE — Patient Instructions (Signed)
We can continue building allergy vaccine  Ok to use benadryl or another antihistamine as needed  Please call if we can help

## 2013-12-08 ENCOUNTER — Ambulatory Visit (INDEPENDENT_AMBULATORY_CARE_PROVIDER_SITE_OTHER): Payer: BC Managed Care – PPO

## 2013-12-08 DIAGNOSIS — J309 Allergic rhinitis, unspecified: Secondary | ICD-10-CM

## 2013-12-10 ENCOUNTER — Encounter: Payer: Self-pay | Admitting: Internal Medicine

## 2013-12-10 ENCOUNTER — Ambulatory Visit (INDEPENDENT_AMBULATORY_CARE_PROVIDER_SITE_OTHER): Payer: BC Managed Care – PPO

## 2013-12-10 DIAGNOSIS — J309 Allergic rhinitis, unspecified: Secondary | ICD-10-CM

## 2013-12-13 ENCOUNTER — Ambulatory Visit (INDEPENDENT_AMBULATORY_CARE_PROVIDER_SITE_OTHER): Payer: BC Managed Care – PPO

## 2013-12-13 DIAGNOSIS — J309 Allergic rhinitis, unspecified: Secondary | ICD-10-CM

## 2013-12-15 ENCOUNTER — Ambulatory Visit (INDEPENDENT_AMBULATORY_CARE_PROVIDER_SITE_OTHER): Payer: BC Managed Care – PPO

## 2013-12-15 DIAGNOSIS — J309 Allergic rhinitis, unspecified: Secondary | ICD-10-CM

## 2013-12-20 ENCOUNTER — Ambulatory Visit (INDEPENDENT_AMBULATORY_CARE_PROVIDER_SITE_OTHER): Payer: BC Managed Care – PPO

## 2013-12-20 DIAGNOSIS — J309 Allergic rhinitis, unspecified: Secondary | ICD-10-CM

## 2013-12-22 ENCOUNTER — Telehealth: Payer: Self-pay | Admitting: Internal Medicine

## 2013-12-22 NOTE — Telephone Encounter (Signed)
Spoke with the pt  She wanted to let CDY know that after allergy shot 12/20/13 she developed small know at site in inj  The next day her arm was very itchy, and when she looked at it, it was swollen and slightly warm  She took a benadryl and itching and swelling are pretty much gone  She has been getting allergy shots for the past month and has never had this reaction  I advised will forward to CDY to make him aware and get any further recs  Please advise, thanks

## 2013-12-22 NOTE — Telephone Encounter (Signed)
Please call patient to check on this. Consider dropping back by 0.1 ml and rebuilding as tolerated. Check with me if any questions.

## 2013-12-23 NOTE — Telephone Encounter (Signed)
I called pt. To let her know what you advised. Just to let you know we ask her every other time if she's had a reaction. Now we will be more diligent in asking. Gave pt. Our direct # I give it to all our new pts.(I try to remember to at least.)

## 2013-12-24 ENCOUNTER — Ambulatory Visit (INDEPENDENT_AMBULATORY_CARE_PROVIDER_SITE_OTHER): Payer: BC Managed Care – PPO

## 2013-12-24 DIAGNOSIS — J309 Allergic rhinitis, unspecified: Secondary | ICD-10-CM

## 2013-12-27 ENCOUNTER — Ambulatory Visit (INDEPENDENT_AMBULATORY_CARE_PROVIDER_SITE_OTHER): Payer: BC Managed Care – PPO

## 2013-12-27 DIAGNOSIS — J309 Allergic rhinitis, unspecified: Secondary | ICD-10-CM

## 2013-12-31 ENCOUNTER — Ambulatory Visit (INDEPENDENT_AMBULATORY_CARE_PROVIDER_SITE_OTHER): Payer: BC Managed Care – PPO

## 2013-12-31 DIAGNOSIS — J309 Allergic rhinitis, unspecified: Secondary | ICD-10-CM

## 2014-01-01 DIAGNOSIS — Z91018 Allergy to other foods: Secondary | ICD-10-CM

## 2014-01-01 DIAGNOSIS — H1010 Acute atopic conjunctivitis, unspecified eye: Secondary | ICD-10-CM

## 2014-01-01 HISTORY — DX: Allergy to other foods: Z91.018

## 2014-01-01 HISTORY — DX: Acute atopic conjunctivitis, unspecified eye: H10.10

## 2014-01-01 NOTE — Assessment & Plan Note (Signed)
Watch for food-pollen cross reaction such as ragweed/banana

## 2014-01-01 NOTE — Assessment & Plan Note (Signed)
Building allergy vaccine here without problems

## 2014-01-01 NOTE — Assessment & Plan Note (Signed)
Building allergy vaccine. Occasional Benadryl has been sufficient.

## 2014-01-03 ENCOUNTER — Ambulatory Visit (INDEPENDENT_AMBULATORY_CARE_PROVIDER_SITE_OTHER): Payer: BC Managed Care – PPO

## 2014-01-03 DIAGNOSIS — J309 Allergic rhinitis, unspecified: Secondary | ICD-10-CM

## 2014-01-07 ENCOUNTER — Ambulatory Visit (INDEPENDENT_AMBULATORY_CARE_PROVIDER_SITE_OTHER): Payer: BC Managed Care – PPO

## 2014-01-07 DIAGNOSIS — J309 Allergic rhinitis, unspecified: Secondary | ICD-10-CM

## 2014-01-10 ENCOUNTER — Ambulatory Visit (INDEPENDENT_AMBULATORY_CARE_PROVIDER_SITE_OTHER): Payer: BC Managed Care – PPO

## 2014-01-10 DIAGNOSIS — J309 Allergic rhinitis, unspecified: Secondary | ICD-10-CM

## 2014-01-14 ENCOUNTER — Ambulatory Visit (INDEPENDENT_AMBULATORY_CARE_PROVIDER_SITE_OTHER): Payer: BC Managed Care – PPO

## 2014-01-14 DIAGNOSIS — J309 Allergic rhinitis, unspecified: Secondary | ICD-10-CM

## 2014-01-17 ENCOUNTER — Ambulatory Visit (INDEPENDENT_AMBULATORY_CARE_PROVIDER_SITE_OTHER): Payer: BC Managed Care – PPO

## 2014-01-17 DIAGNOSIS — J309 Allergic rhinitis, unspecified: Secondary | ICD-10-CM

## 2014-01-21 ENCOUNTER — Ambulatory Visit (INDEPENDENT_AMBULATORY_CARE_PROVIDER_SITE_OTHER): Payer: BC Managed Care – PPO

## 2014-01-21 ENCOUNTER — Ambulatory Visit: Payer: BC Managed Care – PPO

## 2014-01-21 DIAGNOSIS — J309 Allergic rhinitis, unspecified: Secondary | ICD-10-CM

## 2014-01-24 ENCOUNTER — Ambulatory Visit (INDEPENDENT_AMBULATORY_CARE_PROVIDER_SITE_OTHER): Payer: BC Managed Care – PPO

## 2014-01-24 DIAGNOSIS — J309 Allergic rhinitis, unspecified: Secondary | ICD-10-CM

## 2014-01-28 ENCOUNTER — Ambulatory Visit (INDEPENDENT_AMBULATORY_CARE_PROVIDER_SITE_OTHER): Payer: BC Managed Care – PPO

## 2014-01-28 DIAGNOSIS — J309 Allergic rhinitis, unspecified: Secondary | ICD-10-CM

## 2014-01-31 ENCOUNTER — Ambulatory Visit (INDEPENDENT_AMBULATORY_CARE_PROVIDER_SITE_OTHER): Payer: BC Managed Care – PPO

## 2014-01-31 ENCOUNTER — Other Ambulatory Visit: Payer: Self-pay | Admitting: Internal Medicine

## 2014-01-31 DIAGNOSIS — J309 Allergic rhinitis, unspecified: Secondary | ICD-10-CM

## 2014-02-04 ENCOUNTER — Ambulatory Visit (INDEPENDENT_AMBULATORY_CARE_PROVIDER_SITE_OTHER): Payer: BC Managed Care – PPO

## 2014-02-04 DIAGNOSIS — J309 Allergic rhinitis, unspecified: Secondary | ICD-10-CM

## 2014-02-08 ENCOUNTER — Ambulatory Visit (INDEPENDENT_AMBULATORY_CARE_PROVIDER_SITE_OTHER): Payer: BC Managed Care – PPO

## 2014-02-08 DIAGNOSIS — J309 Allergic rhinitis, unspecified: Secondary | ICD-10-CM

## 2014-02-09 ENCOUNTER — Ambulatory Visit (INDEPENDENT_AMBULATORY_CARE_PROVIDER_SITE_OTHER): Payer: BC Managed Care – PPO

## 2014-02-09 DIAGNOSIS — J309 Allergic rhinitis, unspecified: Secondary | ICD-10-CM

## 2014-02-11 ENCOUNTER — Ambulatory Visit (INDEPENDENT_AMBULATORY_CARE_PROVIDER_SITE_OTHER): Payer: BC Managed Care – PPO

## 2014-02-11 DIAGNOSIS — J309 Allergic rhinitis, unspecified: Secondary | ICD-10-CM

## 2014-02-14 ENCOUNTER — Ambulatory Visit (INDEPENDENT_AMBULATORY_CARE_PROVIDER_SITE_OTHER): Payer: BC Managed Care – PPO

## 2014-02-14 DIAGNOSIS — J309 Allergic rhinitis, unspecified: Secondary | ICD-10-CM

## 2014-02-15 ENCOUNTER — Encounter: Payer: Self-pay | Admitting: Internal Medicine

## 2014-02-18 ENCOUNTER — Ambulatory Visit (INDEPENDENT_AMBULATORY_CARE_PROVIDER_SITE_OTHER): Payer: BC Managed Care – PPO

## 2014-02-18 DIAGNOSIS — J309 Allergic rhinitis, unspecified: Secondary | ICD-10-CM

## 2014-02-21 ENCOUNTER — Ambulatory Visit (INDEPENDENT_AMBULATORY_CARE_PROVIDER_SITE_OTHER): Payer: BC Managed Care – PPO

## 2014-02-21 DIAGNOSIS — J309 Allergic rhinitis, unspecified: Secondary | ICD-10-CM

## 2014-02-25 ENCOUNTER — Ambulatory Visit (INDEPENDENT_AMBULATORY_CARE_PROVIDER_SITE_OTHER): Payer: BC Managed Care – PPO

## 2014-02-25 DIAGNOSIS — J309 Allergic rhinitis, unspecified: Secondary | ICD-10-CM

## 2014-02-28 ENCOUNTER — Ambulatory Visit (INDEPENDENT_AMBULATORY_CARE_PROVIDER_SITE_OTHER): Payer: BC Managed Care – PPO

## 2014-02-28 DIAGNOSIS — J309 Allergic rhinitis, unspecified: Secondary | ICD-10-CM

## 2014-03-07 ENCOUNTER — Ambulatory Visit: Payer: BC Managed Care – PPO

## 2014-03-09 ENCOUNTER — Ambulatory Visit: Payer: BC Managed Care – PPO

## 2014-03-11 ENCOUNTER — Ambulatory Visit (INDEPENDENT_AMBULATORY_CARE_PROVIDER_SITE_OTHER): Payer: BC Managed Care – PPO

## 2014-03-11 DIAGNOSIS — J309 Allergic rhinitis, unspecified: Secondary | ICD-10-CM

## 2014-03-14 ENCOUNTER — Telehealth: Payer: Self-pay | Admitting: Internal Medicine

## 2014-03-14 NOTE — Telephone Encounter (Signed)
lmomtcb x1 

## 2014-03-15 NOTE — Telephone Encounter (Signed)
Called spoke with pt. Aware of recs. She asked if I would send this message to her mychart. I have done so. nothing further needed

## 2014-03-15 NOTE — Telephone Encounter (Signed)
I called spoke with pt. She c/o scratchy throat, PND, nasal cong, slight dry cough x couple days. Pt is using OTC allergy/sinus medication. Please advise Dr. Maple HudsonYoung thanks No Known Allergies   Current Outpatient Prescriptions on File Prior to Visit  Medication Sig Dispense Refill  . ALREX 0.2 % SUSP Use 1 drop in both eyes twice daily      . diphenhydrAMINE (BENADRYL) 25 MG tablet Take 25 mg by mouth daily as needed.      Marland Kitchen. EPINEPHrine (EPI-PEN) 0.3 mg/0.3 mL SOAJ injection Inject 0.3 mLs (0.3 mg total) into the muscle once.  1 Device  0  . FLUoxetine (PROZAC) 20 MG capsule TAKE ONE CAPSULE BY MOUTH EVERY DAY  90 capsule  3  . glipiZIDE (GLIPIZIDE XL) 2.5 MG 24 hr tablet Take 1 tablet (2.5 mg total) by mouth daily with breakfast.  90 tablet  1  . glucose blood (ACCU-CHEK ACTIVE STRIPS) test strip Use as directed once daily to check blood sugar.  100 each  11  . Lancets (FREESTYLE) lancets Use as instructed 1 once daily  100 each  11  . lisinopril (PRINIVIL,ZESTRIL) 10 MG tablet TAKE ONE TABLET BY MOUTH EVERY DAY  90 tablet  3  . lovastatin (MEVACOR) 40 MG tablet Take 1 tablet (40 mg total) by mouth daily.  90 tablet  3  . metFORMIN (GLUCOPHAGE-XR) 500 MG 24 hr tablet TAKE TWO TABLETS BY MOUTH TWICE DAILY  360 tablet  3  . metFORMIN (GLUCOPHAGE-XR) 500 MG 24 hr tablet Take 2 tablets (1,000 mg total) by mouth 2 (two) times daily.  120 tablet  5  . SUMAtriptan (IMITREX) 100 MG tablet Take 1 tablet (100 mg total) by mouth every other day as needed.  10 tablet  11   No current facility-administered medications on file prior to visit.

## 2014-03-15 NOTE — Telephone Encounter (Signed)
Called and spoke with pt and she is aware of CY recs.  Pt requested that a my chart email be sent to her with CY recs and this has been done.

## 2014-03-15 NOTE — Telephone Encounter (Signed)
This does not sound like a bacterial infection at this point, so it is either viral or allergy. Need to treat with fluids, antihistamine, decongestant like Sudafed-PE or Mucinex, and consider trying an otc nasal steroid spray like Flonase/ fluticasone

## 2014-03-16 ENCOUNTER — Ambulatory Visit (INDEPENDENT_AMBULATORY_CARE_PROVIDER_SITE_OTHER): Payer: BC Managed Care – PPO

## 2014-03-16 DIAGNOSIS — J309 Allergic rhinitis, unspecified: Secondary | ICD-10-CM

## 2014-03-23 ENCOUNTER — Ambulatory Visit (INDEPENDENT_AMBULATORY_CARE_PROVIDER_SITE_OTHER): Payer: BC Managed Care – PPO

## 2014-03-23 ENCOUNTER — Ambulatory Visit (INDEPENDENT_AMBULATORY_CARE_PROVIDER_SITE_OTHER): Payer: BC Managed Care – PPO | Admitting: Internal Medicine

## 2014-03-23 ENCOUNTER — Encounter: Payer: Self-pay | Admitting: Internal Medicine

## 2014-03-23 VITALS — BP 142/98 | HR 83 | Temp 98.9°F | Ht 62.0 in | Wt 134.5 lb

## 2014-03-23 DIAGNOSIS — J209 Acute bronchitis, unspecified: Secondary | ICD-10-CM

## 2014-03-23 DIAGNOSIS — Z0000 Encounter for general adult medical examination without abnormal findings: Secondary | ICD-10-CM

## 2014-03-23 DIAGNOSIS — E119 Type 2 diabetes mellitus without complications: Secondary | ICD-10-CM

## 2014-03-23 DIAGNOSIS — J309 Allergic rhinitis, unspecified: Secondary | ICD-10-CM

## 2014-03-23 HISTORY — DX: Acute bronchitis, unspecified: J20.9

## 2014-03-23 MED ORDER — LOVASTATIN 40 MG PO TABS: 40.0000 mg | ORAL_TABLET | Freq: Every day | ORAL | Status: DC

## 2014-03-23 MED ORDER — LISINOPRIL 10 MG PO TABS: 10.0000 mg | ORAL_TABLET | Freq: Every day | ORAL | Status: DC

## 2014-03-23 MED ORDER — AZITHROMYCIN 250 MG PO TABS: ORAL_TABLET | ORAL | Status: DC

## 2014-03-23 MED ORDER — METFORMIN HCL 1000 MG PO TABS: 1000.0000 mg | ORAL_TABLET | Freq: Two times a day (BID) | ORAL | Status: DC

## 2014-03-23 NOTE — Assessment & Plan Note (Signed)
Mild to mod, for antibx course,  to f/u any worsening symptoms or concerns 

## 2014-03-23 NOTE — Progress Notes (Signed)
Subjective:    Patient ID: Ruth Crawford, female    DOB: 09/18/1955, 58 y.o.   MRN: [id number]  HPI  Here for wellness and f/u;  Overall doing ok;  Pt denies CP, worsening SOB, DOE, wheezing, orthopnea, PND, worsening LE edema, palpitations, dizziness or syncope.  Pt denies neurological change such as new headache, facial or extremity weakness.  Pt denies polydipsia, polyuria, or low sugar symptoms. Pt states overall good compliance with treatment and medications, good tolerability, and has been trying to follow lower cholesterol diet.  Pt denies worsening depressive symptoms, suicidal ideation or panic. No fever, night sweats, wt loss, loss of appetite, or other constitutional symptoms.  Pt states good ability with ADL's, has low fall risk, home safety reviewed and adequate, no other significant changes in hearing or vision, and only occasionally active with exercise.  Declines lab today Incidnetly Here with acute onset mild to mod 2-3 days ST, HA, general weakness and malaise, with prod cough greenish sputum,  Does also have prob RLS symtpoms with several months creepy crawly sensation and feet jumping with trying to get to sleep, mild, declines tx for now.  Asks for metformin rx to be changed to 1000 mg tab easiser admin.  Bp at home more often < 140/90 Past Medical History  Diagnosis Date  . DIABETES MELLITUS, TYPE II   . HYPERLIPIDEMIA   . ANXIETY   . DEPRESSION   . COMMON MIGRAINE   . HYPERTENSION   . POLYP, GALLBLADDER   . GERD (gastroesophageal reflux disease) 10/17/2012   Past Surgical History  Procedure Laterality Date  . Abdominal hysterectomy  2000  . Lumbar disc surgery  1998    s/p    reports that she has never smoked. She has never used smokeless tobacco. She reports that she does not drink alcohol or use illicit drugs. family history includes Alcohol abuse in her other; Arthritis in her other; Diabetes in her mother; Heart disease in her father; Stroke in her other. No Known  Allergies Current Outpatient Prescriptions on File Prior to Visit  Medication Sig Dispense Refill  . ALREX 0.2 % SUSP Use 1 drop in both eyes twice daily      . diphenhydrAMINE (BENADRYL) 25 MG tablet Take 25 mg by mouth daily as needed.      Marland Kitchen. EPINEPHrine (EPI-PEN) 0.3 mg/0.3 mL SOAJ injection Inject 0.3 mLs (0.3 mg total) into the muscle once.  1 Device  0  . FLUoxetine (PROZAC) 20 MG capsule TAKE ONE CAPSULE BY MOUTH EVERY DAY  90 capsule  3  . glipiZIDE (GLIPIZIDE XL) 2.5 MG 24 hr tablet Take 1 tablet (2.5 mg total) by mouth daily with breakfast.  90 tablet  1  . glucose blood (ACCU-CHEK ACTIVE STRIPS) test strip Use as directed once daily to check blood sugar.  100 each  11  . Lancets (FREESTYLE) lancets Use as instructed 1 once daily  100 each  11  . SUMAtriptan (IMITREX) 100 MG tablet Take 1 tablet (100 mg total) by mouth every other day as needed.  10 tablet  11   No current facility-administered medications on file prior to visit.    Review of Systems Constitutional: Negative for increased diaphoresis, other activity, appetite or other siginficant weight change  HENT: Negative for worsening hearing loss, ear pain, facial swelling, mouth sores and neck stiffness.   Eyes: Negative for other worsening pain, redness or visual disturbance.  Respiratory: Negative for shortness of breath and wheezing.  Cardiovascular: Negative for chest pain and palpitations.  Gastrointestinal: Negative for diarrhea, blood in stool, abdominal distention or other pain Genitourinary: Negative for hematuria, flank pain or change in urine volume.  Musculoskeletal: Negative for myalgias or other joint complaints.  Skin: Negative for color change and wound.  Neurological: Negative for syncope and numbness. other than noted Hematological: Negative for adenopathy. or other swelling Psychiatric/Behavioral: Negative for hallucinations, self-injury, decreased concentration or other worsening agitation.        Objective:   Physical Exam BP 142/98  Pulse 83  Temp(Src) 98.9 F (37.2 C) (Oral)  Ht 5\' 2"  (1.575 m)  Wt 134 lb 8 oz (61.009 kg)  BMI 24.59 kg/m2  SpO2 97% VS noted, mild ill Constitutional: Pt is oriented to person, place, and time. Appears well-developed and well-nourished.  Head: Normocephalic and atraumatic.  Right Ear: External ear normal.  Left Ear: External ear normal.  Nose: Nose normal.  Mouth/Throat: Oropharynx is clear and moist.  Bilat tm's with mild erythema.  Max sinus areas non tender.  Pharynx with mild erythema, no exudate Eyes: Conjunctivae and EOM are normal. Pupils are equal, round, and reactive to light.  Neck: Normal range of motion. Neck supple. No JVD present. No tracheal deviation present.  Cardiovascular: Normal rate, regular rhythm, normal heart sounds and intact distal pulses.   Pulmonary/Chest: Effort normal and breath sounds without rales or wheezing  Abdominal: Soft. Bowel sounds are normal. NT. No HSM  Musculoskeletal: Normal range of motion. Exhibits no edema.  Lymphadenopathy:  Has no cervical adenopathy.  Neurological: Pt is alert and oriented to person, place, and time. Pt has normal reflexes. No cranial nerve deficit. Motor grossly intact Skin: Skin is warm and dry. No rash noted.  Psychiatric:  Has normal mood and affect. Behavior is normal.      Assessment & Plan:   BP Readings from Last 3 Encounters:  03/23/14 142/98  12/07/13 132/80  10/01/13 140/98

## 2014-03-23 NOTE — Progress Notes (Signed)
Pre visit review using our clinic review tool, if applicable. No additional management support is needed unless otherwise documented below in the visit note. 

## 2014-03-23 NOTE — Assessment & Plan Note (Signed)
stable overall by history and exam, recent data reviewed with pt, and pt to continue medical treatment as before,  to f/u any worsening symptoms or concerns Lab Results  Component Value Date   HGBA1C 6.4 09/22/2013

## 2014-03-23 NOTE — Patient Instructions (Signed)
Please take all new medication as prescribed - the antibiotic  You can also take Delsym OTC for cough, and/or Mucinex (or it's generic off brand) for congestion, and tylenol as needed for pain.  Please continue all other medications as before, and refills have been done if requested, and metformin was changed as you requested to the 1000 mg tabs  Please have the pharmacy call with any other refills you may need.  Please continue your efforts at being more active, low cholesterol diet, and weight control.  You are otherwise up to date with prevention measures today.  Please keep your appointments with your specialists as you may have planned  Please return in 6 months, or sooner if needed

## 2014-03-23 NOTE — Assessment & Plan Note (Signed)

## 2014-03-25 ENCOUNTER — Encounter: Payer: Self-pay | Admitting: Internal Medicine

## 2014-03-25 ENCOUNTER — Ambulatory Visit (INDEPENDENT_AMBULATORY_CARE_PROVIDER_SITE_OTHER): Payer: BC Managed Care – PPO | Admitting: Internal Medicine

## 2014-03-25 VITALS — BP 124/82 | HR 83 | Ht 60.5 in | Wt 136.2 lb

## 2014-03-25 DIAGNOSIS — H109 Unspecified conjunctivitis: Secondary | ICD-10-CM

## 2014-03-25 DIAGNOSIS — J302 Other seasonal allergic rhinitis: Secondary | ICD-10-CM

## 2014-03-25 DIAGNOSIS — Z91018 Allergy to other foods: Secondary | ICD-10-CM

## 2014-03-25 DIAGNOSIS — J309 Allergic rhinitis, unspecified: Secondary | ICD-10-CM

## 2014-03-25 DIAGNOSIS — J3089 Other allergic rhinitis: Principal | ICD-10-CM

## 2014-03-25 NOTE — Progress Notes (Signed)
08/20/13- 67 yoF never smoker Ref by Dr Oliver Barre - Eye redness and pain comes and goes - Some runny eyes - Denies itching - Occas Headaches - Denies sneezing She complains of red and painful eyes, persistent over 6 months but relieved by steroid eyedrops. Optometrist told her it might be allergy. She does not wear contact lenses. She improves 4 days or a week at a time but then comes back. No recent antihistamine. History of frequent sinus headaches. Spring and fall seasonal rhinitis treated over-the-counter. No history of asthma. Associates rash with anxiety. Denies any problems from lisinopril. Vision is okay. Denies arthritis. Environment: House with no basement, lactic heat, no mold. 2 dogs. No smokers. She is not working. Family history of allergy.  10/01/13-  86 yoF never smoker Ref by Dr Oliver Barre - Eye redness and pain comes and goes - Some runny eyes - Denies itching - Occas Headaches - Denies sneezing FOLLOWS FOR: States that her allergy sx have not changed since last OV. She did not try lubricating gel for her eyes. Still has the same intermittent burning and watering.antihistamines and various nasal sprays have not helped much.  Now describes seasonal rhinitis since childhood. Generalized itching without visible rash intermittently since her eyes began bothering her.  Lab 08/20/2013-  ANA negative, sedimentation rate only 23, Allergy Profile 12//14- total IgE 353.1, broadly positive. We discussed a trial of allergy vaccine based on this.  12/07/13- 57 yoF never smoker followed for allergic conjunctivitis, allergic rhinitis, food allergy-banana, watermelon FOLLOWS FOR: doing well overall with allergies,still taking allergy shot, ha occass., nasal congestion yesterday-clear,no cough,denies wheezing Allergy vaccine 1:5000 build-up here Not much conjunctivitis so far this spring. Occasional Benadryl. Minor hacking cough in the mornings but no wheeze. Banana and watermelon make her throat   Itch  03/25/14- 57 yoF never smoker followed for allergic conjunctivitis, allergic rhinitis, food allergy-banana, watermelon FOLLOWS FOR: still on Allergy vaccine 1:50 GH   and doing well. Her previous complaints about cough and irritation of her eyes are much better now.  ROS-see HPI Constitutional:   No-   weight loss, night sweats, fevers, chills, fatigue, lassitude. HEENT:   +  headaches, no-difficulty swallowing, tooth/dental problems, sore throat,       No-  sneezing, +itching, no-ear ache, +nasal congestion, post nasal drip,  CV:  No-   chest pain, orthopnea, PND, swelling in lower extremities, anasarca, dizziness, palpitations Resp: No-   shortness of breath with exertion or at rest.              No-   productive cough,  No non-productive cough,  No- coughing up of blood.              No-   change in color of mucus.  No- wheezing.   Skin: +occasional rash . GI:  No-   heartburn, indigestion, abdominal pain, nausea, vomiting,  GU:  MS:  No-   joint pain or swelling.  No- decreased range of motion.  Neuro-     nothing unusual Psych:  No- change in mood or affect. No depression or anxiety.  No memory loss.  OBJ- Physical Exam General- Alert, Oriented, Affect-appropriate, Distress- none acute Skin- rash-none, lesions- none, excoriation- none Lymphadenopathy- none Head- atraumatic            Eyes- Gross vision intact, PERRLA, conjunctivae-not red/ injected            Ears- Hearing, canals-normal  Nose- Clear, no-Septal dev, mucus, polyps, erosion, perforation             Throat- Mallampati II , mucosa clear , drainage- none, tonsils- atrophic Neck- flexible , trachea midline, no stridor , thyroid nl, carotid no bruit Chest - symmetrical excursion , unlabored           Heart/CV- RRR , no murmur , no gallop  , no rub, nl s1 s2                           - JVD- none , edema- none, stasis changes- none, varices- none           Lung- clear to P&A, wheeze- none, cough- none ,  dullness-none, rub- none           Chest wall-  Abd-  Br/ Gen/ Rectal- Not done, not indicated Extrem- cyanosis- none, clubbing, none, atrophy- none, strength- nl Neuro- grossly intact to observation

## 2014-03-25 NOTE — Patient Instructions (Addendum)
I will instruct the allergy lab to increase your concentration to 1:10 next time vaccine is made for you.  Please call as needed

## 2014-03-26 NOTE — Assessment & Plan Note (Signed)
We discussed seasonal change, distinction between viral and allergic rhinitis and conjunctivitis. Plan-advanced vaccine to 1:10 with next order

## 2014-03-26 NOTE — Assessment & Plan Note (Signed)
Symptoms parallel rhinitis and both are doing better this year as she continues allergy vaccine with over-the-counter meds when needed.

## 2014-03-26 NOTE — Assessment & Plan Note (Signed)
Avoids problem foods

## 2014-03-30 ENCOUNTER — Ambulatory Visit (INDEPENDENT_AMBULATORY_CARE_PROVIDER_SITE_OTHER): Payer: BC Managed Care – PPO

## 2014-03-30 DIAGNOSIS — J309 Allergic rhinitis, unspecified: Secondary | ICD-10-CM

## 2014-04-05 ENCOUNTER — Other Ambulatory Visit: Payer: Self-pay | Admitting: Internal Medicine

## 2014-04-06 ENCOUNTER — Ambulatory Visit (INDEPENDENT_AMBULATORY_CARE_PROVIDER_SITE_OTHER): Payer: BC Managed Care – PPO

## 2014-04-06 DIAGNOSIS — J309 Allergic rhinitis, unspecified: Secondary | ICD-10-CM

## 2014-04-13 ENCOUNTER — Ambulatory Visit (INDEPENDENT_AMBULATORY_CARE_PROVIDER_SITE_OTHER): Payer: BC Managed Care – PPO

## 2014-04-13 DIAGNOSIS — J309 Allergic rhinitis, unspecified: Secondary | ICD-10-CM

## 2014-04-15 ENCOUNTER — Encounter: Payer: Self-pay | Admitting: Internal Medicine

## 2014-04-19 ENCOUNTER — Other Ambulatory Visit: Payer: Self-pay | Admitting: Internal Medicine

## 2014-04-20 ENCOUNTER — Ambulatory Visit (INDEPENDENT_AMBULATORY_CARE_PROVIDER_SITE_OTHER): Payer: BC Managed Care – PPO

## 2014-04-20 DIAGNOSIS — J309 Allergic rhinitis, unspecified: Secondary | ICD-10-CM

## 2014-04-27 ENCOUNTER — Ambulatory Visit (INDEPENDENT_AMBULATORY_CARE_PROVIDER_SITE_OTHER): Payer: BC Managed Care – PPO

## 2014-04-27 DIAGNOSIS — J309 Allergic rhinitis, unspecified: Secondary | ICD-10-CM

## 2014-05-04 ENCOUNTER — Ambulatory Visit (INDEPENDENT_AMBULATORY_CARE_PROVIDER_SITE_OTHER): Payer: BC Managed Care – PPO

## 2014-05-04 DIAGNOSIS — J309 Allergic rhinitis, unspecified: Secondary | ICD-10-CM

## 2014-05-07 ENCOUNTER — Encounter: Payer: Self-pay | Admitting: Family Medicine

## 2014-05-07 ENCOUNTER — Ambulatory Visit (INDEPENDENT_AMBULATORY_CARE_PROVIDER_SITE_OTHER): Payer: BC Managed Care – PPO | Admitting: Family Medicine

## 2014-05-07 VITALS — BP 128/84 | HR 76 | Temp 98.6°F | Wt 135.5 lb

## 2014-05-07 DIAGNOSIS — M25519 Pain in unspecified shoulder: Secondary | ICD-10-CM

## 2014-05-07 DIAGNOSIS — M25511 Pain in right shoulder: Secondary | ICD-10-CM

## 2014-05-07 NOTE — Patient Instructions (Signed)
You have rotator cuff impingement Try to avoid painful activities (overhead activities, lifting with extended arm) as much as possible. Aleve 2 tabs twice a day with food OR ibuprofen 3 tabs three times a day with food for pain and inflammation. Can take tylenol in addition to this.  Steriod injection may be beneficial to help with pain and to decrease inflammation.  Please call your doctor on Monday morning.

## 2014-05-07 NOTE — Progress Notes (Signed)
Subjective:   Patient ID: Ruth Crawford, female    DOB: 04/08/1956, 58 y.o.   MRN: [id number]  Ruth Crawford is a pleasant 58 y.o. year old female new to me with h/o HTN and DM who presents to weekend clinic today with Shoulder Pain and Numbness  on 05/07/2014  HPI:   Woke up 4 days ago with right shoulder pain that radiated down her arm with numbness into all of her fingers.  She assumed she slept on it "wrong."  Put ice and heat on it- nothing helped. Massaged it with a home machine- not improvement. No known injury.  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  Under significant stress- husband's ex wife is living with them-she thinks "if this is not an injury, then it's stress."  No weakness in right hand- not dropping things, able to turn door knobs.  Pain is worse with overhead movements.  Current Outpatient Prescriptions on File Prior to Visit  Medication Sig Dispense Refill  . ALREX 0.2 % SUSP Use 1 drop in both eyes twice daily      . diphenhydrAMINE (BENADRYL) 25 MG tablet Take 25 mg by mouth daily as needed.      Marland Kitchen. EPINEPHrine (EPI-PEN) 0.3 mg/0.3 mL SOAJ injection Inject 0.3 mLs (0.3 mg total) into the muscle once.  1 Device  0  . FLUoxetine (PROZAC) 20 MG capsule TAKE ONE CAPSULE BY MOUTH ONCE DAILY  90 capsule  3  . glipiZIDE (GLUCOTROL XL) 2.5 MG 24 hr tablet TAKE ONE TABLET BY MOUTH ONCE DAILY WITH BREAKFAST.  90 tablet  3  . glucose blood (ACCU-CHEK ACTIVE STRIPS) test strip Use as directed once daily to check blood sugar.  100 each  11  . Lancets (FREESTYLE) lancets Use as instructed 1 once daily  100 each  11  . lisinopril (PRINIVIL,ZESTRIL) 10 MG tablet Take 1 tablet (10 mg total) by mouth daily.  90 tablet  3  . lovastatin (MEVACOR) 40 MG tablet Take 1 tablet (40 mg total) by mouth daily.  90 tablet  3  . metFORMIN (GLUCOPHAGE) 1000 MG tablet Take 1 tablet (1,000 mg total) by mouth 2 (two) times daily with a meal.  180  tablet  3  . phenylephrine (SUDAFED PE) 10 MG TABS tablet Take 10 mg by mouth daily.      . SUMAtriptan (IMITREX) 100 MG tablet Take 1 tablet (100 mg total) by mouth every other day as needed.  10 tablet  11   No current facility-administered medications on file prior to visit.    No Known Allergies  Past Medical History  Diagnosis Date  . DIABETES MELLITUS, TYPE II   . HYPERLIPIDEMIA   . ANXIETY   . DEPRESSION   . COMMON MIGRAINE   . HYPERTENSION   . POLYP, GALLBLADDER   . GERD (gastroesophageal reflux disease) 10/17/2012    Past Surgical History  Procedure Laterality Date  . Abdominal hysterectomy  2000  . Lumbar disc surgery  1998    s/p    Family History  Problem Relation Age of Onset  . Diabetes Mother   . Heart disease Father   . Stroke Other   . Alcohol abuse Other   . Arthritis Other     History   Social History  . Marital Status: Married    Spouse Name: N/A    Number of Children: 1  . Years of  Education: N/A   Occupational History  . billing clerk     at [employer]   Social History Main Topics  . Smoking status: Never Smoker   . Smokeless tobacco: Never Used  . Alcohol Use: No  . Drug Use: No  . Sexual Activity:    Other Topics Concern  . Not on file   Social History Narrative  . No narrative on file   The PMH, PSH, Social History, Family History, Medications, and allergies have been reviewed in [facility], and have been updated if relevant.    Review of Systems    See HPI Denies swelling of UE No known injury No fevers No redness No CP  No SOB +anxiety Denies depression Objective:    BP 128/84  Pulse 76  Temp(Src) 98.6 F (37 C) (Oral)  Wt 135 lb 8 oz (61.462 kg)  SpO2 98%   Physical Exam  Nursing note and vitals reviewed. Constitutional: She is oriented to person, place, and time. She appears well-developed and well-nourished. No distress.  HENT:  Head: Normocephalic.  Musculoskeletal:  Right shoulder:-normal to  inspection and palpation +empty can +arch sign   Neurological: She is alert and oriented to person, place, and time.  Normal grip strength and sensation bilaterally  Skin: Skin is warm and dry.  Psychiatric: She has a normal mood and affect. Her behavior is normal. Judgment and thought content normal.          Assessment & Plan:   Pain in joint, shoulder region, right No Follow-up on file.

## 2014-05-07 NOTE — Assessment & Plan Note (Signed)
New- consistent with rotator cuff impingement. Unlikely a major tear given range of motion and no known injury. Would likely benefit from PT and subacromial injection. Advised rest, NSAIDs, follow up with PCP on Monday. See AVS.

## 2014-05-07 NOTE — Progress Notes (Signed)
Pre visit review using our clinic review tool, if applicable. No additional management support is needed unless otherwise documented below in the visit note. 

## 2014-05-08 ENCOUNTER — Encounter: Payer: Self-pay | Admitting: Internal Medicine

## 2014-05-09 ENCOUNTER — Encounter: Payer: Self-pay | Admitting: Family Medicine

## 2014-05-09 ENCOUNTER — Ambulatory Visit (INDEPENDENT_AMBULATORY_CARE_PROVIDER_SITE_OTHER)
Admission: RE | Admit: 2014-05-09 | Discharge: 2014-05-09 | Disposition: A | Discharge status: Home / Self Care | Payer: BC Managed Care – PPO | Source: Ambulatory Visit | Attending: Family Medicine | Admitting: Family Medicine

## 2014-05-09 ENCOUNTER — Ambulatory Visit (INDEPENDENT_AMBULATORY_CARE_PROVIDER_SITE_OTHER): Payer: BC Managed Care – PPO | Admitting: Family Medicine

## 2014-05-09 ENCOUNTER — Other Ambulatory Visit (INDEPENDENT_AMBULATORY_CARE_PROVIDER_SITE_OTHER): Payer: BC Managed Care – PPO

## 2014-05-09 VITALS — BP 148/80 | HR 68 | Ht 62.0 in | Wt 135.0 lb

## 2014-05-09 DIAGNOSIS — M5412 Radiculopathy, cervical region: Secondary | ICD-10-CM

## 2014-05-09 DIAGNOSIS — M7551 Bursitis of right shoulder: Secondary | ICD-10-CM

## 2014-05-09 DIAGNOSIS — M67919 Unspecified disorder of synovium and tendon, unspecified shoulder: Secondary | ICD-10-CM

## 2014-05-09 DIAGNOSIS — M719 Bursopathy, unspecified: Secondary | ICD-10-CM

## 2014-05-09 MED ORDER — PREDNISONE 50 MG PO TABS: 50.0000 mg | ORAL_TABLET | Freq: Every day | ORAL | Status: DC

## 2014-05-09 MED ORDER — KETOROLAC TROMETHAMINE 60 MG/2ML IM SOLN
60.0000 mg | Freq: Once | INTRAMUSCULAR | Status: AC
  Administered 2014-05-09: 60 mg via INTRAMUSCULAR

## 2014-05-09 MED ORDER — TRAMADOL HCL 50 MG PO TABS: 50.0000 mg | ORAL_TABLET | Freq: Two times a day (BID) | ORAL | Status: DC | PRN

## 2014-05-09 MED ORDER — GABAPENTIN 100 MG PO CAPS
200.0000 mg | ORAL_CAPSULE | Freq: Every day | ORAL | Status: DC
Start: 2014-05-09 — End: 2014-07-29

## 2014-05-09 NOTE — Assessment & Plan Note (Addendum)
.   We discussed home exercises and patient was given a handout and showed proper technique. We discussed icing regimen that could be beneficial. . Patient will followup again in 3 weeks for further evaluation and treatment. More concerned and patient dissection having cervical radiculopathy.

## 2014-05-09 NOTE — Progress Notes (Signed)
[name] [address] [place], [place] [address] Phone: [phone] Subjective:      CC: Shoulder pain, right  [id number] Ruth Crawford is a 58 y.o. female coming in with complaint of right shoulder pain. Patient states that she has had this pain for approximately 1 week. Patient states she actually woke up with this pain. Patient states that it seems to be radiating down her arm and going only to her fingers. Patient states that nothing seems to be helping. Patient has tried massage, over-the-counter medicines, as well as some ice. Patient has been in increasing stress recently that could be also contributing she states. Denies any weakness and can still do daily activities. Still having pain with overhead activity or reaching behind her back. Patient was a severity of 8/10. Patient was seen yesterday and was told to do anti-inflammatories and states only minimal improvement.  Past medical history shows the patient did have a MRI and 2012. Cervical MRI showed cervical spondylosis but no significant nerve root impingement.    Past medical history, social, surgical and family history all reviewed in electronic medical record.   Review of Systems: No headache, visual changes, nausea, vomiting, diarrhea, constipation, dizziness, abdominal pain, skin rash, fevers, chills, night sweats, weight loss, swollen lymph nodes, body aches, joint swelling, muscle aches, chest pain, shortness of breath, mood changes.   Objective Blood pressure 148/80, pulse 68, height  (1.575 m), weight 135 lb (61.236 kg), SpO2 95.00%.  General: No apparent distress alert and oriented x3 mood and affect normal, dressed appropriately.  HEENT: Pupils equal, extraocular movements intact  Respiratory: Patient's speak in full sentences and does not appear short of breath  Cardiovascular: No lower extremity edema, non tender, no erythema  Skin: Warm dry intact with no signs of infection  or rash on extremities or on axial skeleton.  Abdomen: Soft nontender  Neuro: Cranial nerves II through XII are intact, neurovascularly intact in all extremities with 2+ DTRs and 2+ pulses.  Lymph: No lymphadenopathy of posterior or anterior cervical chain or axillae bilaterally.  Gait normal with good balance and coordination.  MSK:  Non tender with full range of motion and good stability and symmetric strength and tone of  elbows, wrist, hip, knee and ankles bilaterally.  Neck: Inspection unremarkable. No palpable stepoffs. Positive Spurling's maneuver. Limited range of motion with extension lacking the last 10 as well as right-sided rotation and side bending Grip strength and sensation normal in bilateral hands Strength good C4 to T1 distribution No sensory change to C4 to T1  Negative Hoffman sign bilaterally Reflexes normal Shoulder: Right Inspection reveals no abnormalities, atrophy or asymmetry. Palpation is normal with no tenderness over AC joint or bicipital groove. ROM is full in all planes passively. Rotator cuff strength normal throughout. signs of impingement with positive Neer and Hawkin's tests, but negative empty can sign. Speeds and Yergason's tests normal. No labral pathology noted with negative Obrien's, negative clunk and good stability. Normal scapular function observed. No painful arc and no drop arm sign. No apprehension sign  MSK US performed of: Right This study was ordered, performed, and interpreted by Terrilee Files D.O.  Shoulder:   Supraspinatus:  Appears normal on long and transverse views, Bursal bulge seen with shoulder abduction on impingement view. Infraspinatus:  Appears normal on long and transverse views. Significant increase in Doppler flow Subscapularis:  Appears normal on long and transverse views. Positive bursa Teres Minor:  Appears normal on long  and transverse views. AC joint:  Capsule undistended, no geyser sign. Glenohumeral Joint:   Appears normal without effusion. Glenoid Labrum:  Intact without visualized tears. Biceps Tendon:  Appears normal on long and transverse views, no fraying of tendon, tendon located in intertubercular groove, no subluxation with shoulder internal or external rotation.  Impression: Subacromial bursitis, mild    Impression and Recommendations:     This case required medical decision making of moderate complexity.

## 2014-05-09 NOTE — Patient Instructions (Signed)
Very nice to meet you Ice 20 minutes 2 times daily. Usually after activity and before bed. Xrays downstairs.  Exercises 3 times a week.  Prednisone daily for 5 days Gabapentin at night.  Tramadol during the day if needed.  Come back in 1 week to make sure you are better.

## 2014-05-09 NOTE — Assessment & Plan Note (Addendum)
History of cervical radiculopathy. I would like to get x-rays today we'll rule out any bony abnormality that is different over the course last 2 years. Patient is having radicular symptoms seem to be in the C6-C7 nerve root on the right side. X-rays were ordered today. Discussed an icing regimen. Patient will take prednisone for the next 5 days. Patient will come back in one week for further evaluation and treatment. Patient knows that any significant weakness, worsening pain, fevers or chills she will come back immediately or go to another provider.

## 2014-05-09 NOTE — Addendum Note (Signed)
Addended by: Edwena Felty T on: 05/09/2014 01:49 PM   Modules accepted: Orders

## 2014-05-10 ENCOUNTER — Ambulatory Visit: Payer: BC Managed Care – PPO | Admitting: Internal Medicine

## 2014-05-10 ENCOUNTER — Other Ambulatory Visit: Payer: Self-pay

## 2014-05-11 ENCOUNTER — Ambulatory Visit (INDEPENDENT_AMBULATORY_CARE_PROVIDER_SITE_OTHER): Payer: BC Managed Care – PPO

## 2014-05-11 DIAGNOSIS — J309 Allergic rhinitis, unspecified: Secondary | ICD-10-CM

## 2014-05-16 ENCOUNTER — Encounter: Payer: Self-pay | Admitting: Internal Medicine

## 2014-05-16 ENCOUNTER — Ambulatory Visit (INDEPENDENT_AMBULATORY_CARE_PROVIDER_SITE_OTHER): Payer: BC Managed Care – PPO | Admitting: Family Medicine

## 2014-05-16 ENCOUNTER — Encounter: Payer: Self-pay | Admitting: Family Medicine

## 2014-05-16 VITALS — BP 142/86 | HR 88 | Ht 62.0 in | Wt 133.0 lb

## 2014-05-16 DIAGNOSIS — M5412 Radiculopathy, cervical region: Secondary | ICD-10-CM

## 2014-05-16 DIAGNOSIS — M719 Bursopathy, unspecified: Secondary | ICD-10-CM

## 2014-05-16 DIAGNOSIS — M67919 Unspecified disorder of synovium and tendon, unspecified shoulder: Secondary | ICD-10-CM

## 2014-05-16 DIAGNOSIS — M7551 Bursitis of right shoulder: Secondary | ICD-10-CM

## 2014-05-16 MED ORDER — MELOXICAM 15 MG PO TABS: 15.0000 mg | ORAL_TABLET | Freq: Every day | ORAL | Status: DC

## 2014-05-16 NOTE — Progress Notes (Signed)
[facility] [address] [place], [place] [address] Phone: [phone] Subjective:      CC: Shoulder pain, right, concern for neck follow up  Ruth Crawford is a 58 y.o. female coming in with complaint of right shoulder pain. Patient is now has pain for 2 weeks. Patient was seen one week ago and there was a concern for patient having cervical radiculopathy. 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 Patient states that she did respond well to the prednisone and states that she is approximately 50% better. Patient stated that last 48 hours the pain seems to be increasing low bed. Patient states any repetitive motion gives her significant pain the next day. Continues with a dull aching pain is mostly in the shoulder that is worse at night. Has been taking gabapentin but does make her feel a little nauseated. Denies any fevers or chills.  Past medical history shows the patient did have a MRI and 2012. Cervical MRI showed cervical spondylosis but no significant nerve root impingement.    Past medical history, social, surgical and family history all reviewed in electronic medical record.   Review of Systems: No headache, visual changes, nausea, vomiting, diarrhea, constipation, dizziness, abdominal pain, skin rash, fevers, chills, night sweats, weight loss, swollen lymph nodes, body aches, joint swelling, muscle aches, chest pain, shortness of breath, mood changes.   Objective Blood pressure 142/86, pulse 88, height  (1.575 m), weight 133 lb (60.328 kg), SpO2 97.00%.  General: No apparent distress alert and oriented x3 mood and affect normal, dressed appropriately.  HEENT: Pupils equal, extraocular movements intact  Respiratory: Patient's speak in full sentences and does not appear short of breath  Cardiovascular: No lower extremity edema, non tender, no erythema  Skin: Warm dry  intact with no signs of infection or rash on extremities or on axial skeleton.  Abdomen: Soft nontender  Neuro: Cranial nerves II through XII are intact, neurovascularly intact in all extremities with 2+ DTRs and 2+ pulses.  Lymph: No lymphadenopathy of posterior or anterior cervical chain or axillae bilaterally.  Gait normal with good balance and coordination.  MSK:  Non tender with full range of motion and good stability and symmetric strength and tone of  elbows, wrist, hip, knee and ankles bilaterally.  Neck: Inspection unremarkable. No palpable stepoffs. Positive Spurling's maneuver. Limited range of motion with extension lacking the last 5 as well as right-sided rotation and side bending Grip strength and sensation normal in bilateral hands Strength good C4 to T1 distribution No sensory change to C4 to T1  Negative Hoffman sign bilaterally Reflexes normal No significant change from previous exam mild improvement in range of motion Shoulder: Right Inspection reveals no abnormalities, atrophy or asymmetry. Palpation is normal with no tenderness over AC joint or bicipital groove. ROM is full in all planes passively. Rotator cuff strength normal throughout. signs of impingement with positive Neer and Hawkin's tests, but negative empty can sign. Speeds and Yergason's tests normal. No labral pathology noted with negative Obrien's, negative clunk and good stability. Normal scapular function observed. No painful arc and no drop arm sign. No apprehension sign  MSK US performed of: Right This study was ordered, performed, and interpreted by Terrilee Files D.O.  Shoulder:   Supraspinatus:  Appears normal on long and transverse views, Bursal bulge seen with shoulder abduction on impingement view. Infraspinatus:  Appears normal on  long and transverse views. Significant increase in Doppler flow Subscapularis:  Appears normal on long and transverse views. Positive bursa Teres Minor:  Appears  normal on long and transverse views. AC joint:  Capsule undistended, no geyser sign. Glenohumeral Joint:  Appears normal without effusion. Glenoid Labrum:  Intact without visualized tears. Biceps Tendon:  Appears normal on long and transverse views, no fraying of tendon, tendon located in intertubercular groove, no subluxation with shoulder internal or external rotation.  Impression: Subacromial bursitis, mild  Procedure: Real-time Ultrasound Guided Injection of right glenohumeral joint Device: GE Logiq E  Ultrasound guided injection is preferred based studies that show increased duration, increased effect, greater accuracy, decreased procedural pain, increased response rate with ultrasound guided versus blind injection.  Verbal informed consent obtained.  Time-out conducted.  Noted no overlying erythema, induration, or other signs of local infection.  Skin prepped in a sterile fashion.  Local anesthesia: Topical Ethyl chloride.  With sterile technique and under real time ultrasound guidance:  Joint visualized.  23g 1  inch needle inserted posterior approach. Pictures taken for needle placement. Patient did have injection of 2 cc of 1% lidocaine, 2 cc of 0.5% Marcaine, and 1.0 cc of Kenalog 40 mg/dL. Completed without difficulty  Pain immediately resolved suggesting accurate placement of the medication.  Advised to call if fevers/chills, erythema, induration, drainage, or persistent bleeding.  Images permanently stored and available for review in the ultrasound unit.  Impression: Technically successful ultrasound guided injection.     Impression and Recommendations:     This case required medical decision making of moderate complexity.

## 2014-05-16 NOTE — Patient Instructions (Addendum)
Good to see you.  Go back to  on the gabapentin and see if that helps.  Meloxicam daily for 10 days then as needed. Stop if it hurts your stomach.  Continue the exercises and the icing.  Call me Friday and if still in pain then we will order MRI.  Otherwise come back and see me again in 2-3 weeks.

## 2014-05-16 NOTE — Assessment & Plan Note (Signed)
Patient was given an injection today with some improvement in her pain. We are doing this for diagnostic as well as hopefully therapeutic result. We discussed the patient continue the home exercises as well as the icing protocol. Patient was given meloxicam to see if we can relieve some of the other inflammation now the patient is off the prednisone. We discussed an icing protocol and showed proper technique multiple exercises. Patient will try these interventions and call in one week. Continuing to have difficulty we will focus more of a cervical radiculopathy and further imaging.  Spent greater than 25 minutes with patient face-to-face and had greater than 50% of counseling including as described above in assessment and plan.

## 2014-05-16 NOTE — Assessment & Plan Note (Signed)
Patient differential continues to include cervical radiculopathy. Patient was given an injection in her shoulder today. If she responds well with continued conservative therapy. Patient does not have any him benefit over the course of the next week and we'll consider further imaging to readdress patient's cervical radiculopathy.

## 2014-05-18 ENCOUNTER — Ambulatory Visit (INDEPENDENT_AMBULATORY_CARE_PROVIDER_SITE_OTHER): Payer: BC Managed Care – PPO

## 2014-05-18 DIAGNOSIS — J309 Allergic rhinitis, unspecified: Secondary | ICD-10-CM

## 2014-05-25 ENCOUNTER — Ambulatory Visit (INDEPENDENT_AMBULATORY_CARE_PROVIDER_SITE_OTHER): Payer: BC Managed Care – PPO

## 2014-05-25 ENCOUNTER — Encounter: Payer: Self-pay | Admitting: Internal Medicine

## 2014-05-25 DIAGNOSIS — J309 Allergic rhinitis, unspecified: Secondary | ICD-10-CM

## 2014-06-01 ENCOUNTER — Ambulatory Visit (INDEPENDENT_AMBULATORY_CARE_PROVIDER_SITE_OTHER): Payer: BC Managed Care – PPO

## 2014-06-01 DIAGNOSIS — J309 Allergic rhinitis, unspecified: Secondary | ICD-10-CM

## 2014-06-08 ENCOUNTER — Ambulatory Visit: Payer: BC Managed Care – PPO

## 2014-06-08 ENCOUNTER — Ambulatory Visit (INDEPENDENT_AMBULATORY_CARE_PROVIDER_SITE_OTHER): Payer: BC Managed Care – PPO

## 2014-06-08 DIAGNOSIS — Z23 Encounter for immunization: Secondary | ICD-10-CM

## 2014-06-08 DIAGNOSIS — J309 Allergic rhinitis, unspecified: Secondary | ICD-10-CM

## 2014-06-13 ENCOUNTER — Ambulatory Visit (INDEPENDENT_AMBULATORY_CARE_PROVIDER_SITE_OTHER): Payer: BC Managed Care – PPO

## 2014-06-13 DIAGNOSIS — J309 Allergic rhinitis, unspecified: Secondary | ICD-10-CM

## 2014-06-15 ENCOUNTER — Ambulatory Visit (INDEPENDENT_AMBULATORY_CARE_PROVIDER_SITE_OTHER): Payer: BC Managed Care – PPO

## 2014-06-15 DIAGNOSIS — J309 Allergic rhinitis, unspecified: Secondary | ICD-10-CM

## 2014-06-16 ENCOUNTER — Ambulatory Visit: Payer: BC Managed Care – PPO

## 2014-06-22 ENCOUNTER — Ambulatory Visit (INDEPENDENT_AMBULATORY_CARE_PROVIDER_SITE_OTHER): Payer: BC Managed Care – PPO

## 2014-06-22 ENCOUNTER — Encounter: Payer: Self-pay | Admitting: Internal Medicine

## 2014-06-22 DIAGNOSIS — J309 Allergic rhinitis, unspecified: Secondary | ICD-10-CM

## 2014-06-29 ENCOUNTER — Ambulatory Visit (INDEPENDENT_AMBULATORY_CARE_PROVIDER_SITE_OTHER): Payer: BC Managed Care – PPO

## 2014-06-29 DIAGNOSIS — J309 Allergic rhinitis, unspecified: Secondary | ICD-10-CM

## 2014-07-01 LAB — HM DIABETES EYE EXAM

## 2014-07-05 ENCOUNTER — Encounter: Payer: Self-pay | Admitting: Internal Medicine

## 2014-07-06 ENCOUNTER — Ambulatory Visit (INDEPENDENT_AMBULATORY_CARE_PROVIDER_SITE_OTHER): Payer: BC Managed Care – PPO

## 2014-07-06 DIAGNOSIS — J309 Allergic rhinitis, unspecified: Secondary | ICD-10-CM

## 2014-07-13 ENCOUNTER — Ambulatory Visit: Payer: BC Managed Care – PPO

## 2014-07-15 ENCOUNTER — Ambulatory Visit (INDEPENDENT_AMBULATORY_CARE_PROVIDER_SITE_OTHER): Payer: BC Managed Care – PPO

## 2014-07-15 DIAGNOSIS — J309 Allergic rhinitis, unspecified: Secondary | ICD-10-CM

## 2014-07-20 ENCOUNTER — Ambulatory Visit (INDEPENDENT_AMBULATORY_CARE_PROVIDER_SITE_OTHER): Payer: BC Managed Care – PPO

## 2014-07-20 DIAGNOSIS — J309 Allergic rhinitis, unspecified: Secondary | ICD-10-CM

## 2014-07-27 ENCOUNTER — Ambulatory Visit (INDEPENDENT_AMBULATORY_CARE_PROVIDER_SITE_OTHER): Payer: BC Managed Care – PPO

## 2014-07-27 DIAGNOSIS — J309 Allergic rhinitis, unspecified: Secondary | ICD-10-CM

## 2014-07-29 ENCOUNTER — Ambulatory Visit (INDEPENDENT_AMBULATORY_CARE_PROVIDER_SITE_OTHER): Payer: BC Managed Care – PPO | Admitting: Internal Medicine

## 2014-07-29 ENCOUNTER — Encounter: Payer: Self-pay | Admitting: Internal Medicine

## 2014-07-29 VITALS — BP 118/70 | HR 90 | Ht 60.5 in | Wt 139.8 lb

## 2014-07-29 DIAGNOSIS — J329 Chronic sinusitis, unspecified: Secondary | ICD-10-CM

## 2014-07-29 DIAGNOSIS — H109 Unspecified conjunctivitis: Secondary | ICD-10-CM

## 2014-07-29 DIAGNOSIS — H1045 Other chronic allergic conjunctivitis: Secondary | ICD-10-CM

## 2014-07-29 DIAGNOSIS — H1010 Acute atopic conjunctivitis, unspecified eye: Secondary | ICD-10-CM

## 2014-07-29 NOTE — Assessment & Plan Note (Signed)
Other than leaf mold, there is not much outdoors that she would be reacting to at this time. I question if she may actually have a chronic sinusitis now. Plan-stopped allergy vaccine, limited CT scan of sinuses

## 2014-07-29 NOTE — Assessment & Plan Note (Signed)
Currently appears controlled

## 2014-07-29 NOTE — Progress Notes (Signed)
08/20/13- [age] yoF never smoker Ref by Dr Oliver BarreJames John - Eye redness and pain comes and goes - Some runny eyes - Denies itching - Occas Headaches - Denies sneezing She complains of red and painful eyes, persistent over 6 months but relieved by steroid eyedrops. Optometrist told her it might be allergy. She does not wear contact lenses. She improves 4 days or a week at a time but then comes back. No recent antihistamine. History of frequent sinus headaches. Spring and fall seasonal rhinitis treated over-the-counter. No history of asthma. Associates rash with anxiety. Denies any problems from lisinopril. Vision is okay. Denies arthritis. Environment: House with no basement, lactic heat, no mold. 2 dogs. No smokers. She is not working. Family history of allergy.  10/01/13-  89 yoF never smoker Ref by Dr Oliver BarreJames John - Eye redness and pain comes and goes - Some runny eyes - Denies itching - Occas Headaches - Denies sneezing FOLLOWS FOR: States that her allergy sx have not changed since last OV. She did not try lubricating gel for her eyes. Still has the same intermittent burning and watering.antihistamines and various nasal sprays have not helped much.  Now describes seasonal rhinitis since childhood. Generalized itching without visible rash intermittently since her eyes began bothering her.  Lab 08/20/2013-  ANA negative, sedimentation rate only 23, Allergy Profile 12//14- total IgE 353.1, broadly positive. We discussed a trial of allergy vaccine based on this.  12/07/13- 57 yoF never smoker followed for allergic conjunctivitis, allergic rhinitis, food allergy-banana, watermelon FOLLOWS FOR: doing well overall with allergies,still taking allergy shot, ha occass., nasal congestion yesterday-clear,no cough,denies wheezing Allergy vaccine 1:5000 build-up here Not much conjunctivitis so far this spring. Occasional Benadryl. Minor hacking cough in the mornings but no wheeze. Banana and watermelon make her throat   Itch  03/25/14- 57 yoF never smoker followed for allergic conjunctivitis, allergic rhinitis, food allergy-banana, watermelon FOLLOWS FOR: still on Allergy vaccine 1:50 GH   and doing well. Her previous complaints about cough and irritation of her eyes are much better now.  07/29/14- 57 yoF never smoker followed for allergic conjunctivitis, allergic rhinitis, food allergy-banana, watermelon FOLLOWS FOR: Still on Allergy vaccine 1:10 GH-cant tell that they are helping and would like to discuss coming off of them. Got shot Wednesday this week and has a bruise, soreness, red area-put cool compress and Benadryl anti itch cream on it.  She has been on allergy vaccine about a year. Currently describing frontal headaches and sinus pressure. She takes an antihistamine +2 Excedrin to relieve headache. Not blowing her nose or sneezing. Bothersome postnasal drip when she first lies down. We are stopping her allergy vaccine.Marland Kitchen.  ROS-see HPI Constitutional:   No-   weight loss, night sweats, fevers, chills, fatigue, lassitude. HEENT:   +  headaches, no-difficulty swallowing, tooth/dental problems, sore throat,       No-  sneezing, +itching, no-ear ache, +nasal congestion, +post nasal drip,  CV:  No-   chest pain, orthopnea, PND, swelling in lower extremities, anasarca, dizziness, palpitations Resp: No-   shortness of breath with exertion or at rest.              No-   productive cough,  No non-productive cough,  No- coughing up of blood.              No-   change in color of mucus.  No- wheezing.   Skin: +occasional rash . GI:  No-   heartburn, indigestion, abdominal pain, nausea, vomiting,  GU:  MS:  No-   joint pain or swelling.  No- decreased range of motion.  Neuro-     nothing unusual Psych:  No- change in mood or affect. No depression or anxiety.  No memory loss.  OBJ- Physical Exam General- Alert, Oriented, Affect-appropriate, Distress- none acute Skin- rash-none, lesions- none, excoriation-  none Lymphadenopathy- none Head- atraumatic            Eyes- Gross vision intact, PERRLA, conjunctivae-not red/ injected                            + periorbital edema            Ears- Hearing, canals-normal            Nose- Clear, no-Septal dev, mucus, polyps, erosion, perforation             Throat- Mallampati II , mucosa clear , drainage- none, tonsils- atrophic Neck- flexible , trachea midline, no stridor , thyroid nl, carotid no bruit Chest - symmetrical excursion , unlabored           Heart/CV- RRR , no murmur , no gallop  , no rub, nl s1 s2                           - JVD- none , edema- none, stasis changes- none, varices- none           Lung- clear to P&A, wheeze- none, cough- none , dullness-none, rub- none           Chest wall-  Abd-  Br/ Gen/ Rectal- Not done, not indicated Extrem- cyanosis- none, clubbing, none, atrophy- none, strength- nl Neuro- grossly intact to observation

## 2014-07-29 NOTE — Patient Instructions (Addendum)
We are stopping allergy shots  Order- limited CT max fac (sinuses)    Dx chronic sinusitis  See if rinsing your nose with saline, like a Neti pot or squeeze bottle will help  Consider the dust and pollen control measure information I gave you

## 2014-08-03 ENCOUNTER — Ambulatory Visit: Payer: BC Managed Care – PPO

## 2014-08-04 ENCOUNTER — Ambulatory Visit (INDEPENDENT_AMBULATORY_CARE_PROVIDER_SITE_OTHER)
Admission: RE | Admit: 2014-08-04 | Discharge: 2014-08-04 | Disposition: A | Discharge status: Home / Self Care | Payer: BC Managed Care – PPO | Source: Ambulatory Visit | Attending: Internal Medicine | Admitting: Internal Medicine

## 2014-08-04 DIAGNOSIS — J329 Chronic sinusitis, unspecified: Secondary | ICD-10-CM

## 2014-08-20 ENCOUNTER — Encounter: Payer: Self-pay | Admitting: Internal Medicine

## 2014-08-23 NOTE — Telephone Encounter (Signed)
Appt scheduled for 12/10, pt aware.

## 2014-08-23 NOTE — Telephone Encounter (Signed)
Scheduling to see above

## 2014-08-25 ENCOUNTER — Other Ambulatory Visit (INDEPENDENT_AMBULATORY_CARE_PROVIDER_SITE_OTHER): Payer: BC Managed Care – PPO

## 2014-08-25 ENCOUNTER — Ambulatory Visit (INDEPENDENT_AMBULATORY_CARE_PROVIDER_SITE_OTHER): Payer: BC Managed Care – PPO | Admitting: Internal Medicine

## 2014-08-25 ENCOUNTER — Encounter: Payer: Self-pay | Admitting: Internal Medicine

## 2014-08-25 VITALS — BP 136/88 | HR 75 | Temp 98.7°F | Ht 62.0 in | Wt 133.0 lb

## 2014-08-25 DIAGNOSIS — E119 Type 2 diabetes mellitus without complications: Secondary | ICD-10-CM

## 2014-08-25 DIAGNOSIS — Z0000 Encounter for general adult medical examination without abnormal findings: Secondary | ICD-10-CM

## 2014-08-25 LAB — LIPID PANEL
CHOL/HDL RATIO: 2
Cholesterol: 123 mg/dL (ref 0–200)
HDL: 57.7 mg/dL (ref >39.00)
LDL Cholesterol: 57 mg/dL (ref 0–99)
NonHDL: 65.3
TRIGLYCERIDES: 43 mg/dL (ref 0.0–149.0)
VLDL: 8.6 mg/dL (ref 0.0–40.0)

## 2014-08-25 LAB — BASIC METABOLIC PANEL
BUN: 11 mg/dL (ref 6–23)
CHLORIDE: 103 meq/L (ref 96–112)
CO2: 26 meq/L (ref 19–32)
Calcium: 8.9 mg/dL (ref 8.4–10.5)
Creatinine, Ser: 0.5 mg/dL (ref 0.4–1.2)
GFR: 152.1 mL/min (ref >60.00)
Glucose, Bld: 202 mg/dL — ABNORMAL HIGH (ref 70–99)
POTASSIUM: 3.4 meq/L — AB (ref 3.5–5.1)
Sodium: 135 mEq/L (ref 135–145)

## 2014-08-25 LAB — MICROALBUMIN / CREATININE URINE RATIO
CREATININE, U: 54.3 mg/dL
MICROALB UR: 1.6 mg/dL (ref 0.0–1.9)
MICROALB/CREAT RATIO: 2.9 mg/g (ref 0.0–30.0)

## 2014-08-25 LAB — URINALYSIS, ROUTINE W REFLEX MICROSCOPIC
BILIRUBIN URINE: NEGATIVE
Hgb urine dipstick: NEGATIVE
Ketones, ur: NEGATIVE
LEUKOCYTES UA: NEGATIVE
Nitrite: NEGATIVE
Specific Gravity, Urine: 1.015 (ref 1.000–1.030)
TOTAL PROTEIN, URINE-UPE24: NEGATIVE
Urine Glucose: >=1000 — AB
Urobilinogen, UA: 0.2 (ref 0.0–1.0)
pH: 5.5 (ref 5.0–8.0)

## 2014-08-25 LAB — CBC WITH DIFFERENTIAL/PLATELET
BASOS ABS: 0.1 10*3/uL (ref 0.0–0.1)
Basophils Relative: 0.9 % (ref 0.0–3.0)
EOS ABS: 0.2 10*3/uL (ref 0.0–0.7)
Eosinophils Relative: 2.3 % (ref 0.0–5.0)
HCT: 36.5 % (ref 36.0–46.0)
Hemoglobin: 11.7 g/dL — ABNORMAL LOW (ref 12.0–15.0)
LYMPHS PCT: 26.7 % (ref 12.0–46.0)
Lymphs Abs: 2.2 10*3/uL (ref 0.7–4.0)
MCHC: 31.9 g/dL (ref 30.0–36.0)
MCV: 81.6 fl (ref 78.0–100.0)
MONO ABS: 0.5 10*3/uL (ref 0.1–1.0)
Monocytes Relative: 6.3 % (ref 3.0–12.0)
NEUTROS PCT: 63.8 % (ref 43.0–77.0)
Neutro Abs: 5.2 10*3/uL (ref 1.4–7.7)
PLATELETS: 429 10*3/uL — AB (ref 150.0–400.0)
RBC: 4.48 Mil/uL (ref 3.87–5.11)
RDW: 15 % (ref 11.5–15.5)
WBC: 8.2 10*3/uL (ref 4.0–10.5)

## 2014-08-25 LAB — HEPATIC FUNCTION PANEL
ALT: 13 U/L (ref 0–35)
AST: 14 U/L (ref 0–37)
Albumin: 4 g/dL (ref 3.5–5.2)
Alkaline Phosphatase: 70 U/L (ref 39–117)
BILIRUBIN TOTAL: 0.3 mg/dL (ref 0.2–1.2)
Bilirubin, Direct: 0.1 mg/dL (ref 0.0–0.3)
TOTAL PROTEIN: 7.2 g/dL (ref 6.0–8.3)

## 2014-08-25 LAB — HEMOGLOBIN A1C: HEMOGLOBIN A1C: 7.6 % — AB (ref 4.6–6.5)

## 2014-08-25 LAB — TSH: TSH: 0.54 u[IU]/mL (ref 0.35–4.50)

## 2014-08-25 NOTE — Patient Instructions (Signed)

## 2014-08-25 NOTE — Progress Notes (Signed)
Subjective:    Patient ID: Ruth PlaterSandy L Say, female    DOB: 10/02/1955, 58 y.o.   MRN: [id number]  HPI  Here for wellness and f/u;  Overall doing ok;  Pt denies CP, worsening SOB, DOE, wheezing, orthopnea, PND, worsening LE edema, palpitations, dizziness or syncope.  Pt denies neurological change such as new headache, facial or extremity weakness.  Pt denies polydipsia, polyuria, or low sugar symptoms. Pt states overall good compliance with treatment and medications, good tolerability, and has been trying to follow lower cholesterol diet.  Pt denies worsening depressive symptoms, suicidal ideation or panic. No fever, night sweats, wt loss, loss of appetite, or other constitutional symptoms.  Pt states good ability with ADL's, has low fall risk, home safety reviewed and adequate, no other significant changes in hearing or vision, and only occasionally active with exercise.  CBG's have been increased recently labile, 146-256 on occas, varialbe diet though has been through dietary education. Had been on prednisone since sept but now off.   Past Medical History  Diagnosis Date  . DIABETES MELLITUS, TYPE II   . HYPERLIPIDEMIA   . ANXIETY   . DEPRESSION   . COMMON MIGRAINE   . HYPERTENSION   . POLYP, GALLBLADDER   . GERD (gastroesophageal reflux disease) 10/17/2012   Past Surgical History  Procedure Laterality Date  . Abdominal hysterectomy  2000  . Lumbar disc surgery  1998    s/p    reports that she has never smoked. She has never used smokeless tobacco. She reports that she does not drink alcohol or use illicit drugs. family history includes Alcohol abuse in her other; Arthritis in her other; Diabetes in her mother; Heart disease in her father; Stroke in her other. No Known Allergies Current Outpatient Prescriptions on File Prior to Visit  Medication Sig Dispense Refill  . ALREX 0.2 % SUSP Use 1 drop in both eyes twice daily    . diphenhydrAMINE (BENADRYL) 25 MG tablet Take 25 mg by mouth daily  as needed.    Marland Kitchen. EPINEPHrine (EPI-PEN) 0.3 mg/0.3 mL SOAJ injection Inject 0.3 mLs (0.3 mg total) into the muscle once. 1 Device 0  . FLUoxetine (PROZAC) 20 MG capsule TAKE ONE CAPSULE BY MOUTH ONCE DAILY 90 capsule 3  . glipiZIDE (GLUCOTROL XL) 2.5 MG 24 hr tablet TAKE ONE TABLET BY MOUTH ONCE DAILY WITH BREAKFAST. 90 tablet 3  . glucose blood (ACCU-CHEK ACTIVE STRIPS) test strip Use as directed once daily to check blood sugar. 100 each 11  . Lancets (FREESTYLE) lancets Use as instructed 1 once daily 100 each 11  . lisinopril (PRINIVIL,ZESTRIL) 10 MG tablet Take 1 tablet (10 mg total) by mouth daily. 90 tablet 3  . lovastatin (MEVACOR) 40 MG tablet Take 1 tablet (40 mg total) by mouth daily. 90 tablet 3  . metFORMIN (GLUCOPHAGE) 1000 MG tablet Take 1 tablet (1,000 mg total) by mouth 2 (two) times daily with a meal. 180 tablet 3  . phenylephrine (SUDAFED PE) 10 MG TABS tablet Take 10 mg by mouth daily.    . SUMAtriptan (IMITREX) 100 MG tablet Take 1 tablet (100 mg total) by mouth every other day as needed. 10 tablet 11   No current facility-administered medications on file prior to visit.   Review of Systems Constitutional: Negative for increased diaphoresis, other activity, appetite or other siginficant weight change  HENT: Negative for worsening hearing loss, ear pain, facial swelling, mouth sores and neck stiffness.   Eyes: Negative for other worsening  pain, redness or visual disturbance.  Respiratory: Negative for shortness of breath and wheezing.   Cardiovascular: Negative for chest pain and palpitations.  Gastrointestinal: Negative for diarrhea, blood in stool, abdominal distention or other pain Genitourinary: Negative for hematuria, flank pain or change in urine volume.  Musculoskeletal: Negative for myalgias or other joint complaints.  Skin: Negative for color change and wound.  Neurological: Negative for syncope and numbness. other than noted Hematological: Negative for adenopathy.  or other swelling Psychiatric/Behavioral: Negative for hallucinations, self-injury, decreased concentration or other worsening agitation.      Objective:   Physical Exam BP 136/88 mmHg  Pulse 75  Temp(Src) 98.7 F (37.1 C) (Oral)  Ht 5\' 2"  (1.575 m)  Wt 133 lb (60.328 kg)  BMI 24.32 kg/m2  SpO2 97% VS noted,  Constitutional: Pt is oriented to person, place, and time. Appears well-developed and well-nourished.  Head: Normocephalic and atraumatic.  Right Ear: External ear normal.  Left Ear: External ear normal.  Nose: Nose normal.  Mouth/Throat: Oropharynx is clear and moist.  Eyes: Conjunctivae and EOM are normal. Pupils are equal, round, and reactive to light.  Neck: Normal range of motion. Neck supple. No JVD present. No tracheal deviation present.  Cardiovascular: Normal rate, regular rhythm, normal heart sounds and intact distal pulses.   Pulmonary/Chest: Effort normal and breath sounds without rales or wheezing  Abdominal: Soft. Bowel sounds are normal. NT. No HSM  Musculoskeletal: Normal range of motion. Exhibits no edema.  Lymphadenopathy:  Has no cervical adenopathy.  Neurological: Pt is alert and oriented to person, place, and time. Pt has normal reflexes. No cranial nerve deficit. Motor grossly intact Skin: Skin is warm and dry. No rash noted.  Psychiatric:  Has normal mood and affect. Behavior is normal.   Wt Readings from Last 3 Encounters:  08/25/14 133 lb (60.328 kg)  07/29/14 139 lb 12.8 oz (63.413 kg)  05/16/14 133 lb (60.328 kg)       Assessment & Plan:

## 2014-08-26 ENCOUNTER — Other Ambulatory Visit (INDEPENDENT_AMBULATORY_CARE_PROVIDER_SITE_OTHER): Payer: BC Managed Care – PPO

## 2014-08-26 DIAGNOSIS — N912 Amenorrhea, unspecified: Secondary | ICD-10-CM | POA: Diagnosis not present

## 2014-08-26 LAB — IBC PANEL
Iron: 56 ug/dL (ref 42–145)
SATURATION RATIOS: 14.4 % — AB (ref 20.0–50.0)
Transferrin: 277.3 mg/dL (ref 212.0–360.0)

## 2014-08-28 NOTE — Assessment & Plan Note (Signed)

## 2014-08-28 NOTE — Assessment & Plan Note (Signed)
On recent prednisone, now off, stable overall by history and exam, recent data reviewed with pt, and pt to continue medical treatment as before,  to f/u any worsening symptoms or concerns, for fu labs/a1c, cont diet/wt control

## 2014-09-23 ENCOUNTER — Ambulatory Visit (INDEPENDENT_AMBULATORY_CARE_PROVIDER_SITE_OTHER): Payer: BLUE CROSS/BLUE SHIELD | Admitting: Internal Medicine

## 2014-09-23 ENCOUNTER — Encounter: Payer: Self-pay | Admitting: Internal Medicine

## 2014-09-23 VITALS — BP 150/100 | HR 79 | Temp 98.6°F | Ht 62.0 in | Wt 136.2 lb

## 2014-09-23 DIAGNOSIS — E785 Hyperlipidemia, unspecified: Secondary | ICD-10-CM

## 2014-09-23 DIAGNOSIS — E119 Type 2 diabetes mellitus without complications: Secondary | ICD-10-CM

## 2014-09-23 DIAGNOSIS — I10 Essential (primary) hypertension: Secondary | ICD-10-CM

## 2014-09-23 NOTE — Assessment & Plan Note (Signed)
stable overall by history and exam, recent data reviewed with pt, and pt to continue medical treatment as before,  to f/u any worsening symptoms or concerns. Lab Results  Component Value Date   LDLCALC 57 08/25/2014    

## 2014-09-23 NOTE — Patient Instructions (Signed)
Please continue all other medications as before, and refills have been done if requested.  Please have the pharmacy call with any other refills you may need.  Please continue your efforts at being more active, low cholesterol diet, and weight control..  Please keep your appointments with your specialists as you may have planned  Please return in 6 months, or sooner if needed, with Lab testing done 3-5 days before  

## 2014-09-23 NOTE — Assessment & Plan Note (Signed)
elev today but overall stable overall by history and exam, recent data reviewed with pt, and pt to continue medical treatment as before - delcines med changes,  to f/u any worsening symptoms or concerns, F/u BP at home and next visit

## 2014-09-23 NOTE — Progress Notes (Signed)
Pre visit review using our clinic review tool, if applicable. No additional management support is needed unless otherwise documented below in the visit note. 

## 2014-09-23 NOTE — Assessment & Plan Note (Signed)
Mild uncontrolled, but getting back to former activity levels, o/w stable overall by history and exam, recent data reviewed with pt, and pt to continue medical treatment as before,  to f/u any worsening symptoms or concerns Lab Results  Component Value Date   HGBA1C 7.6* 08/25/2014

## 2014-09-23 NOTE — Progress Notes (Signed)
Subjective:    Patient ID: Ruth PlaterSandy L Stfort, female    DOB: 06/02/1956, 59 y.o.   MRN: [id number]  HPI  Here to f/u; overall doing ok,  Pt denies chest pain, increased sob or doe, wheezing, orthopnea, PND, increased LE swelling, palpitations, dizziness or syncope.  Pt denies polydipsia, polyuria, or low sugar symptoms such as weakness or confusion improved with po intake.  Pt denies new neurological symptoms such as new headache, or facial or extremity weakness or numbness.   Pt states overall good compliance with meds, has been trying to follow lower cholesterol, diabetic diet, with wt overall stable,  but little exercise however, and actually much less recently due to bilat shoulder bursitis (better now after tx) and recurring right LBP, now improved as well.  Getting back to the gym and prior activity levels, does not want ot change meds today. BP at home and elsewhere have been < 140/90 No new complaints Past Medical History  Diagnosis Date  . DIABETES MELLITUS, TYPE II   . HYPERLIPIDEMIA   . ANXIETY   . DEPRESSION   . COMMON MIGRAINE   . HYPERTENSION   . POLYP, GALLBLADDER   . GERD (gastroesophageal reflux disease) 10/17/2012   Past Surgical History  Procedure Laterality Date  . Abdominal hysterectomy  2000  . Lumbar disc surgery  1998    s/p    reports that she has never smoked. She has never used smokeless tobacco. She reports that she does not drink alcohol or use illicit drugs. family history includes Alcohol abuse in her other; Arthritis in her other; Diabetes in her mother; Heart disease in her father; Stroke in her other. No Known Allergies Current Outpatient Prescriptions on File Prior to Visit  Medication Sig Dispense Refill  . ALREX 0.2 % SUSP Use 1 drop in both eyes twice daily    . diphenhydrAMINE (BENADRYL) 25 MG tablet Take 25 mg by mouth daily as needed.    Marland Kitchen. EPINEPHrine (EPI-PEN) 0.3 mg/0.3 mL SOAJ injection Inject 0.3 mLs (0.3 mg total) into the muscle once. 1 Device 0    . FLUoxetine (PROZAC) 20 MG capsule TAKE ONE CAPSULE BY MOUTH ONCE DAILY 90 capsule 3  . glipiZIDE (GLUCOTROL XL) 2.5 MG 24 hr tablet TAKE ONE TABLET BY MOUTH ONCE DAILY WITH BREAKFAST. 90 tablet 3  . glucose blood (ACCU-CHEK ACTIVE STRIPS) test strip Use as directed once daily to check blood sugar. 100 each 11  . Lancets (FREESTYLE) lancets Use as instructed 1 once daily 100 each 11  . lisinopril (PRINIVIL,ZESTRIL) 10 MG tablet Take 1 tablet (10 mg total) by mouth daily. 90 tablet 3  . lovastatin (MEVACOR) 40 MG tablet Take 1 tablet (40 mg total) by mouth daily. 90 tablet 3  . metFORMIN (GLUCOPHAGE) 1000 MG tablet Take 1 tablet (1,000 mg total) by mouth 2 (two) times daily with a meal. 180 tablet 3  . phenylephrine (SUDAFED PE) 10 MG TABS tablet Take 10 mg by mouth daily.    . SUMAtriptan (IMITREX) 100 MG tablet Take 1 tablet (100 mg total) by mouth every other day as needed. 10 tablet 11   No current facility-administered medications on file prior to visit.    Review of Systems  Constitutional: Negative for unusual diaphoresis or other sweats  HENT: Negative for ringing in ear Eyes: Negative for double vision or worsening visual disturbance.  Respiratory: Negative for choking and stridor.   Gastrointestinal: Negative for vomiting or other signifcant bowel change Genitourinary: Negative for  hematuria or decreased urine volume.  Musculoskeletal: Negative for other MSK pain or swelling Skin: Negative for color change and worsening wound.  Neurological: Negative for tremors and numbness other than noted  Psychiatric/Behavioral: Negative for decreased concentration or agitation other than above       Objective:   Physical Exam BP 150/100 mmHg  Pulse 79  Temp(Src) 98.6 F (37 C) (Oral)  Ht  (1.575 m)  Wt 136 lb 4 oz (61.803 kg)  BMI 24.91 kg/m2  SpO2 99% VS noted,  Constitutional: Pt appears well-developed, well-nourished.  HENT: Head: NCAT.  Right Ear: External ear normal.   Left Ear: External ear normal.  Eyes: . Pupils are equal, round, and reactive to light. Conjunctivae and EOM are normal Neck: Normal range of motion. Neck supple.  Cardiovascular: Normal rate and regular rhythm.   Pulmonary/Chest: Effort normal and breath sounds without rales or wheezing.  Abd:  Soft, NT, ND, + BS Neurological: Pt is alert. Not confused , motor grossly intact Skin: Skin is warm. No rash Psychiatric: Pt behavior is normal. No agitation.    Wt Readings from Last 3 Encounters:  09/23/14 136 lb 4 oz (61.803 kg)  08/25/14 133 lb (60.328 kg)  07/29/14 139 lb 12.8 oz (63.413 kg)    BP Readings from Last 3 Encounters:  09/23/14 150/100  08/25/14 136/88  07/29/14 118/70       Assessment & Plan:

## 2014-09-26 ENCOUNTER — Telehealth: Payer: Self-pay | Admitting: Internal Medicine

## 2014-09-26 NOTE — Telephone Encounter (Signed)
emmi emailed °

## 2015-01-04 ENCOUNTER — Other Ambulatory Visit: Payer: Self-pay | Admitting: Internal Medicine

## 2015-01-04 MED ORDER — METHYLPREDNISOLONE 4 MG PO TBPK: ORAL_TABLET | ORAL | Status: DC

## 2015-01-04 NOTE — Telephone Encounter (Signed)
Spoke by phone to Dr Myrtis Serkatz working with pt regarding LBP with radiation to left leg, improved overall but likely to benefit from steroid short course, pt was declining prednisone  OK for medrol pak asd ,  to f/u any worsening symptoms or concerns

## 2015-01-11 ENCOUNTER — Encounter: Payer: Self-pay | Admitting: Internal Medicine

## 2015-01-27 ENCOUNTER — Encounter: Payer: Self-pay | Admitting: Internal Medicine

## 2015-01-27 ENCOUNTER — Ambulatory Visit (INDEPENDENT_AMBULATORY_CARE_PROVIDER_SITE_OTHER): Payer: BLUE CROSS/BLUE SHIELD | Admitting: Internal Medicine

## 2015-01-27 VITALS — BP 130/82 | HR 86 | Ht 62.0 in | Wt 133.0 lb

## 2015-01-27 DIAGNOSIS — J069 Acute upper respiratory infection, unspecified: Secondary | ICD-10-CM

## 2015-01-27 DIAGNOSIS — J309 Allergic rhinitis, unspecified: Secondary | ICD-10-CM

## 2015-01-27 DIAGNOSIS — J3089 Other allergic rhinitis: Principal | ICD-10-CM

## 2015-01-27 DIAGNOSIS — H60391 Other infective otitis externa, right ear: Secondary | ICD-10-CM | POA: Diagnosis not present

## 2015-01-27 DIAGNOSIS — J302 Other seasonal allergic rhinitis: Secondary | ICD-10-CM

## 2015-01-27 MED ORDER — AMOXICILLIN-POT CLAVULANATE 875-125 MG PO TABS: 1.0000 | ORAL_TABLET | Freq: Two times a day (BID) | ORAL | Status: DC

## 2015-01-27 NOTE — Patient Instructions (Signed)
Script sent for augmentin antibiotic  Some people feel they can stomach antibiotics a little easier if they take an otc probiotic like Align, Florastor, or Activia yogurt   Sudafed from the pharmacy may help with the ear congestion

## 2015-01-27 NOTE — Progress Notes (Signed)
08/20/13- [age] yoF never smoker Ref by Dr Oliver BarreJames John - Eye redness and pain comes and goes - Some runny eyes - Denies itching - Occas Headaches - Denies sneezing She complains of red and painful eyes, persistent over 6 months but relieved by steroid eyedrops. Optometrist told her it might be allergy. She does not wear contact lenses. She improves 4 days or a week at a time but then comes back. No recent antihistamine. History of frequent sinus headaches. Spring and fall seasonal rhinitis treated over-the-counter. No history of asthma. Associates rash with anxiety. Denies any problems from lisinopril. Vision is okay. Denies arthritis. Environment: House with no basement, lactic heat, no mold. 2 dogs. No smokers. She is not working. Family history of allergy.  10/01/13-  [age] yoF never smoker Ref by Dr Oliver BarreJames John - Eye redness and pain comes and goes - Some runny eyes - Denies itching - Occas Headaches - Denies sneezing FOLLOWS FOR: States that her allergy sx have not changed since last OV. She did not try lubricating gel for her eyes. Still has the same intermittent burning and watering.antihistamines and various nasal sprays have not helped much.  Now describes seasonal rhinitis since childhood. Generalized itching without visible rash intermittently since her eyes began bothering her.  Lab 08/20/2013-  ANA negative, sedimentation rate only 23, Allergy Profile 12//14- total IgE 353.1, broadly positive. We discussed a trial of allergy vaccine based on this.  12/07/13- 57 yoF never smoker followed for allergic conjunctivitis, allergic rhinitis, food allergy-banana, watermelon FOLLOWS FOR: doing well overall with allergies,still taking allergy shot, ha occass., nasal congestion yesterday-clear,no cough,denies wheezing Allergy vaccine 1:5000 build-up here Not much conjunctivitis so far this spring. Occasional Benadryl. Minor hacking cough in the mornings but no wheeze. Banana and watermelon make her throat   Itch  03/25/14- 57 yoF never smoker followed for allergic conjunctivitis, allergic rhinitis, food allergy-banana, watermelon FOLLOWS FOR: still on Allergy vaccine 1:50 GH   and doing well. Her previous complaints about cough and irritation of her eyes are much better now.  07/29/14- 57 yoF never smoker followed for allergic conjunctivitis, allergic rhinitis, food allergy-banana, watermelon FOLLOWS FOR: Still on Allergy vaccine 1:10 GH-cant tell that they are helping and would like to discuss coming off of them. Got shot Wednesday this week and has a bruise, soreness, red area-put cool compress and Benadryl anti itch cream on it.  She has been on allergy vaccine about a year. Currently describing frontal headaches and sinus pressure. She takes an antihistamine +2 Excedrin to relieve headache. Not blowing her nose or sneezing. Bothersome postnasal drip when she first lies down. We are stopping her allergy vaccine..  01/27/15- 57 yoF never smoker followed for allergic conjunctivitis, allergic rhinitis, food allergy-banana, watermelon Follows For: Pt has had a cold x 2 weeks. C/o of right sided ear and jaw pain, prod cough with clear/yellow mucus. Pt wants to talk about new herbal med flora sinus for allergies. Allergy vaccine dc/d 07/29/14 CT sinus 08/05/15 IMPRESSION: No significant sinus fluid. Nasal septal deviation of 2 mm. Electronically Signed  By: Davonna BellingJohn Curnes M.D.  On: 08/04/2014 10:  ROS-see HPI Constitutional:   No-   weight loss, night sweats, fevers, chills, fatigue, lassitude. HEENT:   +  headaches, no-difficulty swallowing, tooth/dental problems, sore throat,       No-  sneezing, +itching, no-ear ache, +nasal congestion, +post nasal drip,  CV:  No-   chest pain, orthopnea, PND, swelling in lower extremities, anasarca, dizziness, palpitations Resp: No-  shortness of breath with exertion or at rest.              No-   productive cough,  No non-productive cough,  No- coughing up  of blood.              No-   change in color of mucus.  No- wheezing.   Skin: +occasional rash . GI:  No-   heartburn, indigestion, abdominal pain, nausea, vomiting,  GU:  MS:  No-   joint pain or swelling.  No- decreased range of motion.  Neuro-     nothing unusual Psych:  No- change in mood or affect. No depression or anxiety.  No memory loss.  OBJ- Physical Exam General- Alert, Oriented, Affect-appropriate, Distress- none acute Skin- rash-none, lesions- none, excoriation- none Lymphadenopathy- none Head- atraumatic            Eyes- Gross vision intact, PERRLA, conjunctivae-not red/ injected                            + periorbital edema            Ears- +R canal and TM red, not bulging            Nose- Clear, no-Septal dev, mucus, polyps, erosion, perforation             Throat- Mallampati II , mucosa clear , drainage- none, tonsils- atrophic Neck- flexible , trachea midline, no stridor , thyroid nl, carotid no bruit Chest - symmetrical excursion , unlabored           Heart/CV- RRR , no murmur , no gallop  , no rub, nl s1 s2                           - JVD- none , edema- none, stasis changes- none, varices- none           Lung- clear to P&A, wheeze- none, cough- none , dullness-none, rub- none           Chest wall-  Abd-  Br/ Gen/ Rectal- Not done, not indicated Extrem- cyanosis- none, clubbing, none, atrophy- none, strength- nl Neuro- grossly intact to observation

## 2015-01-28 DIAGNOSIS — J069 Acute upper respiratory infection, unspecified: Secondary | ICD-10-CM | POA: Insufficient documentation

## 2015-01-28 DIAGNOSIS — H60399 Other infective otitis externa, unspecified ear: Secondary | ICD-10-CM | POA: Insufficient documentation

## 2015-01-28 NOTE — Assessment & Plan Note (Signed)
Acute onset with an URI, but not clearing, in a diabetic. Plan- Augmentin, sudafed

## 2015-01-28 NOTE — Assessment & Plan Note (Signed)
Viral pattern URI initially, discussed diagnostic  Criteria for sinusitis

## 2015-01-28 NOTE — Assessment & Plan Note (Signed)
Now off allergy vaccine x 6 months.  Plan- antihistamines and nasal steroid sprays as discussed

## 2015-01-31 ENCOUNTER — Telehealth: Payer: Self-pay | Admitting: Internal Medicine

## 2015-01-31 DIAGNOSIS — H6590 Unspecified nonsuppurative otitis media, unspecified ear: Secondary | ICD-10-CM

## 2015-01-31 NOTE — Telephone Encounter (Signed)
Spoke with pt, states the augmentin she was given on Friday by CY has helped the soreness in her ear but her ear is still stopped up.  Pt is taking sudafed as well as directed.  Is requesting further recs. Pt uses [facility] in [place].    CY please advise.  Thanks!  No Known Allergies Current Outpatient Prescriptions on File Prior to Visit  Medication Sig Dispense Refill  . ALREX 0.2 % SUSP Use 1 drop in both eyes twice daily    . amoxicillin-clavulanate (AUGMENTIN) 875-125 MG per tablet Take 1 tablet by mouth 2 (two) times daily. 14 tablet 0  . diphenhydrAMINE (BENADRYL) 25 MG tablet Take 25 mg by mouth daily as needed.    Marland Kitchen. EPINEPHrine (EPI-PEN) 0.3 mg/0.3 mL SOAJ injection Inject 0.3 mLs (0.3 mg total) into the muscle once. 1 Device 0  . FLUoxetine (PROZAC) 20 MG capsule TAKE ONE CAPSULE BY MOUTH ONCE DAILY 90 capsule 3  . glipiZIDE (GLUCOTROL XL) 2.5 MG 24 hr tablet TAKE ONE TABLET BY MOUTH ONCE DAILY WITH BREAKFAST. 90 tablet 3  . glucose blood (ACCU-CHEK ACTIVE STRIPS) test strip Use as directed once daily to check blood sugar. 100 each 11  . Lancets (FREESTYLE) lancets Use as instructed 1 once daily 100 each 11  . lisinopril (PRINIVIL,ZESTRIL) 10 MG tablet Take 1 tablet (10 mg total) by mouth daily. 90 tablet 3  . lovastatin (MEVACOR) 40 MG tablet Take 1 tablet (40 mg total) by mouth daily. 90 tablet 3  . metFORMIN (GLUCOPHAGE) 1000 MG tablet Take 1 tablet (1,000 mg total) by mouth 2 (two) times daily with a meal. 180 tablet 3  . phenylephrine (SUDAFED PE) 10 MG TABS tablet Take 10 mg by mouth daily.     No current facility-administered medications on file prior to visit.

## 2015-01-31 NOTE — Telephone Encounter (Signed)
[facility]

## 2015-01-31 NOTE — Telephone Encounter (Signed)
Pt aware of recs.  Ok with referral to [facility].  This referral has been placed.  Nothing further needed.

## 2015-03-02 ENCOUNTER — Other Ambulatory Visit (INDEPENDENT_AMBULATORY_CARE_PROVIDER_SITE_OTHER): Payer: BLUE CROSS/BLUE SHIELD

## 2015-03-02 ENCOUNTER — Ambulatory Visit (INDEPENDENT_AMBULATORY_CARE_PROVIDER_SITE_OTHER): Payer: BLUE CROSS/BLUE SHIELD | Admitting: Internal Medicine

## 2015-03-02 ENCOUNTER — Encounter: Payer: Self-pay | Admitting: Internal Medicine

## 2015-03-02 VITALS — BP 132/84 | HR 89 | Temp 98.0°F | Ht 62.0 in | Wt 130.0 lb

## 2015-03-02 DIAGNOSIS — H1013 Acute atopic conjunctivitis, bilateral: Secondary | ICD-10-CM

## 2015-03-02 DIAGNOSIS — Z0189 Encounter for other specified special examinations: Secondary | ICD-10-CM

## 2015-03-02 DIAGNOSIS — I10 Essential (primary) hypertension: Secondary | ICD-10-CM

## 2015-03-02 DIAGNOSIS — E785 Hyperlipidemia, unspecified: Secondary | ICD-10-CM | POA: Diagnosis not present

## 2015-03-02 DIAGNOSIS — E119 Type 2 diabetes mellitus without complications: Secondary | ICD-10-CM | POA: Diagnosis not present

## 2015-03-02 DIAGNOSIS — Z0000 Encounter for general adult medical examination without abnormal findings: Secondary | ICD-10-CM

## 2015-03-02 DIAGNOSIS — H1010 Acute atopic conjunctivitis, unspecified eye: Secondary | ICD-10-CM | POA: Insufficient documentation

## 2015-03-02 LAB — CBC WITH DIFFERENTIAL/PLATELET
BASOS ABS: 0.1 10*3/uL (ref 0.0–0.1)
Basophils Relative: 0.9 % (ref 0.0–3.0)
Eosinophils Absolute: 0.3 10*3/uL (ref 0.0–0.7)
Eosinophils Relative: 3.3 % (ref 0.0–5.0)
HCT: 36.4 % (ref 36.0–46.0)
HEMOGLOBIN: 11.7 g/dL — AB (ref 12.0–15.0)
LYMPHS PCT: 26.5 % (ref 12.0–46.0)
Lymphs Abs: 2.3 10*3/uL (ref 0.7–4.0)
MCHC: 32.2 g/dL (ref 30.0–36.0)
MCV: 79.5 fl (ref 78.0–100.0)
MONOS PCT: 5.9 % (ref 3.0–12.0)
Monocytes Absolute: 0.5 10*3/uL (ref 0.1–1.0)
NEUTROS ABS: 5.5 10*3/uL (ref 1.4–7.7)
NEUTROS PCT: 63.4 % (ref 43.0–77.0)
Platelets: 451 10*3/uL — ABNORMAL HIGH (ref 150.0–400.0)
RBC: 4.58 Mil/uL (ref 3.87–5.11)
RDW: 16.1 % — AB (ref 11.5–15.5)
WBC: 8.7 10*3/uL (ref 4.0–10.5)

## 2015-03-02 LAB — LIPID PANEL
CHOLESTEROL: 129 mg/dL (ref 0–200)
HDL: 59.9 mg/dL (ref >39.00)
LDL CALC: 48 mg/dL (ref 0–99)
NonHDL: 69.1
TRIGLYCERIDES: 104 mg/dL (ref 0.0–149.0)
Total CHOL/HDL Ratio: 2
VLDL: 20.8 mg/dL (ref 0.0–40.0)

## 2015-03-02 LAB — HEPATIC FUNCTION PANEL
ALT: 12 U/L (ref 0–35)
AST: 14 U/L (ref 0–37)
Albumin: 4.3 g/dL (ref 3.5–5.2)
Alkaline Phosphatase: 63 U/L (ref 39–117)
BILIRUBIN DIRECT: 0.1 mg/dL (ref 0.0–0.3)
TOTAL PROTEIN: 7.3 g/dL (ref 6.0–8.3)
Total Bilirubin: 0.5 mg/dL (ref 0.2–1.2)

## 2015-03-02 LAB — BASIC METABOLIC PANEL
BUN: 10 mg/dL (ref 6–23)
CALCIUM: 9.3 mg/dL (ref 8.4–10.5)
CO2: 30 meq/L (ref 19–32)
Chloride: 100 mEq/L (ref 96–112)
Creatinine, Ser: 0.68 mg/dL (ref 0.40–1.20)
GFR: 113.89 mL/min (ref >60.00)
GLUCOSE: 234 mg/dL — AB (ref 70–99)
Potassium: 3.5 mEq/L (ref 3.5–5.1)
Sodium: 136 mEq/L (ref 135–145)

## 2015-03-02 LAB — HEMOGLOBIN A1C: Hgb A1c MFr Bld: 7.1 % — ABNORMAL HIGH (ref 4.6–6.5)

## 2015-03-02 MED ORDER — AZELASTINE HCL 0.05 % OP SOLN: 1.0000 [drp] | Freq: Two times a day (BID) | OPHTHALMIC | Status: DC

## 2015-03-02 MED ORDER — GLIPIZIDE ER 5 MG PO TB24: 5.0000 mg | ORAL_TABLET | Freq: Every day | ORAL | Status: DC

## 2015-03-02 NOTE — Assessment & Plan Note (Signed)
Ok for optivar asd,  to f/u any worsening symptoms or concerns, cont all other tx including nasal tx

## 2015-03-02 NOTE — Assessment & Plan Note (Addendum)
Mild uncontrolled related to less active due to back pain, for surgury soon, but for now increase he glipizide ER, f/u labs today , cont diet efforts wt control

## 2015-03-02 NOTE — Assessment & Plan Note (Signed)
stable overall by history and exam, recent data reviewed with pt, and pt to continue medical treatment as before,  to f/u any worsening symptoms or concerns. Lab Results  Component Value Date   LDLCALC 57 08/25/2014

## 2015-03-02 NOTE — Progress Notes (Signed)
Subjective:    Patient ID: Ruth Crawford, female    DOB: [birth year], 59 y.o.   MRN: [id number]  HPI  Here to f/u; overall doing ok,  Pt denies chest pain, increasing sob or doe, wheezing, orthopnea, PND, increased LE swelling, palpitations, dizziness or syncope.  Pt denies new neurological symptoms such as new headache, or facial or extremity weakness or numbness.  Pt denies polydipsia, polyuria, or low sugar episode.   Pt denies new neurological symptoms such as new headache, or facial or extremity weakness or numbness.   Pt states overall good compliance with meds, mostly trying to follow appropriate diet, with wt overall stable,  but little exercise however.  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 Wt Readings from Last 3 Encounters:  03/02/15 130 lb (58.968 kg)  01/27/15 133 lb (60.328 kg)  09/23/14 136 lb 4 oz (61.803 kg)  Did also see ENT for reduced right hearing, rx with astelin (too expensive) changed to another similar but aparenlyt causing bad taste to food, also tx doxy course, hsa f/u next tues June 21 for what sounds like nasopharyngoscopy, also ? Deviated septum.    Past Medical History  Diagnosis Date  . DIABETES MELLITUS, TYPE II   . HYPERLIPIDEMIA   . ANXIETY   . DEPRESSION   . COMMON MIGRAINE   . HYPERTENSION   . POLYP, GALLBLADDER   . GERD (gastroesophageal reflux disease) 10/17/2012   Past Surgical History  Procedure Laterality Date  . Abdominal hysterectomy  2000  . Lumbar disc surgery  1998    s/p    reports that she has never smoked. She has never used smokeless tobacco. She reports that she does not drink alcohol or use illicit drugs. family history includes Alcohol abuse in her other; Arthritis in her other; Diabetes in her mother; Heart disease in her father; Stroke in her other. No Known Allergies Current  Outpatient Prescriptions on File Prior to Visit  Medication Sig Dispense Refill  . ALREX 0.2 % SUSP Use 1 drop in both eyes twice daily    . diphenhydrAMINE (BENADRYL) 25 MG tablet Take 25 mg by mouth daily as needed.    Marland Kitchen EPINEPHrine (EPI-PEN) 0.3 mg/0.3 mL SOAJ injection Inject 0.3 mLs (0.3 mg total) into the muscle once. 1 Device 0  . FLUoxetine (PROZAC) 20 MG capsule TAKE ONE CAPSULE BY MOUTH ONCE DAILY 90 capsule 3  . glipiZIDE (GLUCOTROL XL) 2.5 MG 24 hr tablet TAKE ONE TABLET BY MOUTH ONCE DAILY WITH BREAKFAST. 90 tablet 3  . glucose blood (ACCU-CHEK ACTIVE STRIPS) test strip Use as directed once daily to check blood sugar. 100 each 11  . Lancets (FREESTYLE) lancets Use as instructed 1 once daily 100 each 11  . lisinopril (PRINIVIL,ZESTRIL) 10 MG tablet Take 1 tablet (10 mg total) by mouth daily. 90 tablet 3  . lovastatin (MEVACOR) 40 MG tablet Take 1 tablet (40 mg total) by mouth daily. 90 tablet 3  . metFORMIN (GLUCOPHAGE) 1000 MG tablet Take 1 tablet (1,000 mg total) by mouth 2 (two) times daily with a meal. 180 tablet 3  . phenylephrine (SUDAFED PE) 10 MG TABS tablet Take 10 mg by mouth daily.    Marland Kitchen amoxicillin-clavulanate (AUGMENTIN) 875-125 MG per tablet Take 1 tablet by mouth 2 (two) times daily. (Patient not taking: Reported on 03/02/2015) 14 tablet  0   No current facility-administered medications on file prior to visit.   Review of Systems  Constitutional: Negative for unusual diaphoresis or night sweats HENT: Negative for ringing in ear or discharge Eyes: Negative for double vision or worsening visual disturbance.  Respiratory: Negative for choking and stridor.   Gastrointestinal: Negative for vomiting or other signifcant bowel change Genitourinary: Negative for hematuria or change in urine volume.  Musculoskeletal: Negative for other MSK pain or swelling Skin: Negative for color change and worsening wound.  Neurological: Negative for tremors and numbness other than noted    Psychiatric/Behavioral: Negative for decreased concentration or agitation other than above       Objective:   Physical Exam BP 132/84 mmHg  Pulse 89  Temp(Src) 98 F (36.7 C) (Oral)  Ht  (1.575 m)  Wt 130 lb (58.968 kg)  BMI 23.77 kg/m2  SpO2 98% VS noted,  Constitutional: Pt appears in no significant distress HENT: Head: NCAT.  Right Ear: External ear normal.  Left Ear: External ear normal.  Eyes: . Pupils are equal, round, and reactive to light. Conjunctivae with significant clear drainage bilat and EOM are normal Neck: Normal range of motion. Neck supple.  Cardiovascular: Normal rate and regular rhythm.   Pulmonary/Chest: Effort normal and breath sounds without rales or wheezing.  Abd:  Soft, NT, ND, + BS Neurological: Pt is alert. Not confused , motor grossly intact Skin: Skin is warm. No rash, no LE edema Psychiatric: Pt behavior is normal. No agitation.     Assessment & Plan:

## 2015-03-02 NOTE — Assessment & Plan Note (Signed)
stable overall by history and exam, recent data reviewed with pt, and pt to continue medical treatment as before,  to f/u any worsening symptoms or concerns BP Readings from Last 3 Encounters:  03/02/15 132/84  01/27/15 130/82  09/23/14 150/100

## 2015-03-02 NOTE — Patient Instructions (Signed)
Please take all new medication as prescribed - the optivar for eyes  Please continue all other medications as before, and refills have been done if requested.  Please have the pharmacy call with any other refills you may need.  Please continue your efforts at being more active, low cholesterol diet, and weight control.  Please keep your appointments with your specialists as you may have planned  Please go to the LAB in the Basement (turn left off the elevator) for the tests to be done today  You will be contacted by phone if any changes need to be made immediately.  Otherwise, you will receive a letter about your results with an explanation, but please check with MyChart first.  Please remember to sign up for MyChart if you have not done so, as this will be important to you in the future with finding out test results, communicating by private email, and scheduling acute appointments online when needed.  Please return in 6 months, or sooner if needed, with Lab testing done 3-5 days before

## 2015-03-02 NOTE — Progress Notes (Signed)
Pre visit review using our clinic review tool, if applicable. No additional management support is needed unless otherwise documented below in the visit note. 

## 2015-03-03 ENCOUNTER — Other Ambulatory Visit: Payer: BLUE CROSS/BLUE SHIELD

## 2015-03-03 DIAGNOSIS — E119 Type 2 diabetes mellitus without complications: Secondary | ICD-10-CM | POA: Diagnosis not present

## 2015-03-03 LAB — MICROALBUMIN / CREATININE URINE RATIO
CREATININE, U: 65.7 mg/dL
Microalb Creat Ratio: 2 mg/g (ref 0.0–30.0)
Microalb, Ur: 1.3 mg/dL (ref 0.0–1.9)

## 2015-03-31 ENCOUNTER — Other Ambulatory Visit: Payer: Self-pay | Admitting: Internal Medicine

## 2015-04-19 ENCOUNTER — Ambulatory Visit (INDEPENDENT_AMBULATORY_CARE_PROVIDER_SITE_OTHER): Payer: BLUE CROSS/BLUE SHIELD | Admitting: Internal Medicine

## 2015-04-19 ENCOUNTER — Encounter: Payer: Self-pay | Admitting: Internal Medicine

## 2015-04-19 VITALS — BP 104/68 | HR 95 | Temp 98.2°F | Ht 62.0 in | Wt 123.0 lb

## 2015-04-19 DIAGNOSIS — E119 Type 2 diabetes mellitus without complications: Secondary | ICD-10-CM | POA: Diagnosis not present

## 2015-04-19 DIAGNOSIS — Z0000 Encounter for general adult medical examination without abnormal findings: Secondary | ICD-10-CM | POA: Diagnosis not present

## 2015-04-19 DIAGNOSIS — Z23 Encounter for immunization: Secondary | ICD-10-CM

## 2015-04-19 NOTE — Progress Notes (Signed)
Subjective:    Patient ID: Ruth Crawford, female    DOB: Dec 12, 1955, 59 y.o.   MRN: [id number]  HPI  Here for wellness and f/u;  Overall doing ok;  Pt denies Chest pain, worsening SOB, DOE, wheezing, orthopnea, PND, worsening LE edema, palpitations, dizziness or syncope.  Pt denies neurological change such as new headache, facial or extremity weakness.  Pt denies polydipsia, polyuria, or low sugar symptoms. Pt states overall good compliance with treatment and medications, good tolerability, and has been trying to follow appropriate diet.  Pt denies worsening depressive symptoms, suicidal ideation or panic. No fever, night sweats, wt loss, loss of appetite, or other constitutional symptoms.  Pt states good ability with ADL's, has low fall risk, home safety reviewed and adequate, no other significant changes in hearing or vision, and only occasionally active with exercise. S/p lumbar surgury, has lost some weight; Wt Readings from Last 3 Encounters:  04/19/15 123 lb (55.792 kg)  03/02/15 130 lb (58.968 kg)  01/27/15 133 lb (60.328 kg)   Past Medical History  Diagnosis Date  . DIABETES MELLITUS, TYPE II   . HYPERLIPIDEMIA   . ANXIETY   . DEPRESSION   . COMMON MIGRAINE   . HYPERTENSION   . POLYP, GALLBLADDER   . GERD (gastroesophageal reflux disease) 10/17/2012   Past Surgical History  Procedure Laterality Date  . Abdominal hysterectomy  2000  . Lumbar disc surgery  1998    s/p    reports that she has never smoked. She has never used smokeless tobacco. She reports that she does not drink alcohol or use illicit drugs. family history includes Alcohol abuse in her other; Arthritis in her other; Diabetes in her mother; Heart disease in her father; Stroke in her other. No Known Allergies Current Outpatient Prescriptions on File Prior to Visit  Medication Sig Dispense Refill  . ALREX 0.2 % SUSP Use 1 drop in both eyes twice daily    . azelastine (ASTELIN) 0.1 % nasal spray   0  . azelastine  (OPTIVAR) 0.05 % ophthalmic solution Place 1 drop into both eyes 2 (two) times daily. 6 mL 12  . diphenhydrAMINE (BENADRYL) 25 MG tablet Take 25 mg by mouth daily as needed.    . doxycycline (VIBRAMYCIN) 100 MG capsule   0  . EPINEPHrine (EPI-PEN) 0.3 mg/0.3 mL SOAJ injection Inject 0.3 mLs (0.3 mg total) into the muscle once. 1 Device 0  . FLUoxetine (PROZAC) 20 MG capsule TAKE ONE CAPSULE BY MOUTH ONCE DAILY 90 capsule 3  . glipiZIDE (GLIPIZIDE XL) 5 MG 24 hr tablet Take 1 tablet (5 mg total) by mouth daily with breakfast. 90 tablet 3  . glucose blood (ACCU-CHEK ACTIVE STRIPS) test strip Use as directed once daily to check blood sugar. 100 each 11  . Lancets (FREESTYLE) lancets Use as instructed 1 once daily 100 each 11  . lisinopril (PRINIVIL,ZESTRIL) 10 MG tablet TAKE ONE TABLET BY MOUTH ONCE DAILY 90 tablet 1  . lovastatin (MEVACOR) 40 MG tablet TAKE ONE TABLET BY MOUTH ONCE DAILY 90 tablet 1  . metFORMIN (GLUCOPHAGE) 1000 MG tablet Take 1 tablet (1,000 mg total) by mouth 2 (two) times daily with a meal. 180 tablet 3  . Olopatadine HCl 0.6 % SOLN   0  . phenylephrine (SUDAFED PE) 10 MG TABS tablet Take 10 mg by mouth daily.     No current facility-administered medications on file prior to visit.     Review of Systems Constitutional: Negative for  increased diaphoresis, other activity, appetite or siginficant weight change other than noted HENT: Negative for worsening hearing loss, ear pain, facial swelling, mouth sores and neck stiffness.   Eyes: Negative for other worsening pain, redness or visual disturbance.  Respiratory: Negative for shortness of breath and wheezing  Cardiovascular: Negative for chest pain and palpitations.  Gastrointestinal: Negative for diarrhea, blood in stool, abdominal distention or other pain Genitourinary: Negative for hematuria, flank pain or change in urine volume.  Musculoskeletal: Negative for myalgias or other joint complaints.  Skin: Negative for color  change and wound or drainage.  Neurological: Negative for syncope and numbness. other than noted Hematological: Negative for adenopathy. or other swelling Psychiatric/Behavioral: Negative for hallucinations, SI, self-injury, decreased concentration or other worsening agitation.      Objective:   Physical Exam BP 104/68 mmHg  Pulse 95  Temp(Src) 98.2 F (36.8 C) (Oral)  Ht  (1.575 m)  Wt 123 lb (55.792 kg)  BMI 22.49 kg/m2  SpO2 97% VS noted,  Constitutional: Pt is oriented to person, place, and time. Appears well-developed and well-nourished, in no significant distress Head: Normocephalic and atraumatic.  Right Ear: External ear normal.  Left Ear: External ear normal.  Nose: Nose normal.  Mouth/Throat: Oropharynx is clear and moist.  Eyes: Conjunctivae and EOM are normal. Pupils are equal, round, and reactive to light.  Neck: Normal range of motion. Neck supple. No JVD present. No tracheal deviation present or significant neck LA or mass Cardiovascular: Normal rate, regular rhythm, normal heart sounds and intact distal pulses.   Pulmonary/Chest: Effort normal and breath sounds without rales or wheezing  Abdominal: Soft. Bowel sounds are normal. NT. No HSM  Musculoskeletal: Normal range of motion. Exhibits no edema.  Lymphadenopathy:  Has no cervical adenopathy.  Neurological: Pt is alert and oriented to person, place, and time. Pt has normal reflexes. No cranial nerve deficit. Motor grossly intact Skin: Skin is warm and dry. No rash noted.  Psychiatric:  Has normal mood and affect. Behavior is normal.     Assessment & Plan:

## 2015-04-19 NOTE — Assessment & Plan Note (Signed)
stable overall by history and exam, recent data reviewed with pt, and pt to continue medical treatment as before,  to f/u any worsening symptoms or concerns Lab Results  Component Value Date   HGBA1C 7.1* 03/02/2015    

## 2015-04-19 NOTE — Patient Instructions (Addendum)
You had the pneumovax today  Please continue all other medications as before, and refills have been done if requested.  Please have the pharmacy call with any other refills you may need.  Please continue your efforts at being more active, low cholesterol diet, and weight control.  You are otherwise up to date with prevention measures today.  Please keep your appointments with your specialists as you may have planned  Please return in 6 months, or sooner if needed, with Lab testing done 3-5 days before

## 2015-04-19 NOTE — Assessment & Plan Note (Signed)

## 2015-04-19 NOTE — Addendum Note (Signed)
Addended by: Anselm Jungling on: 04/19/2015 10:17 AM   Modules accepted: Orders

## 2015-04-19 NOTE — Progress Notes (Signed)
Pre visit review using our clinic review tool, if applicable. No additional management support is needed unless otherwise documented below in the visit note. 

## 2015-05-01 ENCOUNTER — Other Ambulatory Visit: Payer: Self-pay | Admitting: Internal Medicine

## 2015-05-05 ENCOUNTER — Encounter: Payer: Self-pay | Admitting: Internal Medicine

## 2015-05-30 ENCOUNTER — Ambulatory Visit: Payer: BLUE CROSS/BLUE SHIELD | Admitting: Internal Medicine

## 2015-05-30 ENCOUNTER — Other Ambulatory Visit (INDEPENDENT_AMBULATORY_CARE_PROVIDER_SITE_OTHER): Payer: BLUE CROSS/BLUE SHIELD

## 2015-05-30 ENCOUNTER — Ambulatory Visit (INDEPENDENT_AMBULATORY_CARE_PROVIDER_SITE_OTHER): Payer: BLUE CROSS/BLUE SHIELD | Admitting: Internal Medicine

## 2015-05-30 ENCOUNTER — Encounter: Payer: Self-pay | Admitting: Internal Medicine

## 2015-05-30 VITALS — BP 124/84 | HR 100 | Temp 98.1°F | Ht 62.0 in | Wt 123.0 lb

## 2015-05-30 DIAGNOSIS — M545 Low back pain, unspecified: Secondary | ICD-10-CM | POA: Insufficient documentation

## 2015-05-30 DIAGNOSIS — Z23 Encounter for immunization: Secondary | ICD-10-CM

## 2015-05-30 DIAGNOSIS — R350 Frequency of micturition: Secondary | ICD-10-CM

## 2015-05-30 DIAGNOSIS — M5442 Lumbago with sciatica, left side: Secondary | ICD-10-CM

## 2015-05-30 DIAGNOSIS — E119 Type 2 diabetes mellitus without complications: Secondary | ICD-10-CM | POA: Diagnosis not present

## 2015-05-30 DIAGNOSIS — I10 Essential (primary) hypertension: Secondary | ICD-10-CM

## 2015-05-30 LAB — URINALYSIS, ROUTINE W REFLEX MICROSCOPIC
Bilirubin Urine: NEGATIVE
Hgb urine dipstick: NEGATIVE
Ketones, ur: NEGATIVE
LEUKOCYTES UA: NEGATIVE
Nitrite: NEGATIVE
RBC / HPF: NONE SEEN (ref 0–?)
SPECIFIC GRAVITY, URINE: 1.025 (ref 1.000–1.030)
Total Protein, Urine: NEGATIVE
Urobilinogen, UA: 0.2 (ref 0.0–1.0)
pH: 5.5 (ref 5.0–8.0)

## 2015-05-30 MED ORDER — DICLOFENAC SODIUM 75 MG PO TBEC: 75.0000 mg | DELAYED_RELEASE_TABLET | Freq: Two times a day (BID) | ORAL | Status: DC

## 2015-05-30 NOTE — Patient Instructions (Signed)
Please take all new medication as prescribed - the anti-inflammatory  Please continue all other medications as before, including the other pain medication, and muscle relaxer as needed  You may wish to call to see if the gabapentin can be increased to 300 mg three times daily if the 100 mg dosing is not helping  Please have the pharmacy call with any other refills you may need.  Please keep your appointments with your specialists as you may have planned  Please go to the LAB in the Basement (turn left off the elevator) for the tests to be done today - just the urine testing  You will be contacted by phone if any changes need to be made immediately.  Otherwise, you will receive a letter about your results with an explanation, but please check with MyChart first.  Please remember to sign up for MyChart if you have not done so, as this will be important to you in the future with finding out test results, communicating by private email, and scheduling acute appointments online when needed.

## 2015-05-30 NOTE — Progress Notes (Signed)
Pre visit review using our clinic review tool, if applicable. No additional management support is needed unless otherwise documented below in the visit note. 

## 2015-06-01 ENCOUNTER — Ambulatory Visit: Payer: BLUE CROSS/BLUE SHIELD | Admitting: Internal Medicine

## 2015-06-04 NOTE — Progress Notes (Signed)
Subjective:    Patient ID: Ruth Crawford, female    DOB: [birth year], 59 y.o.   MRN: [id number]  HPI  Here to f/u; overall doing ok,  Pt denies chest pain, increasing sob or doe, wheezing, orthopnea, PND, increased LE swelling, palpitations, dizziness or syncope.  Pt denies new neurological symptoms such as new headache, or facial or extremity weakness or numbness.  Pt denies polydipsia, polyuria, or low sugar episode.   Pt denies new neurological symptoms such as new headache, or facial or extremity weakness or numbness.   Pt states overall good compliance with meds, mostly trying to follow appropriate diet, with wt overall stable.  Pt with 2-3 days left LBP sharp,, no bowel or bladder change, fever, wt loss,  worsening LE/numbness/weakness though has pain radiating to the knee intermittent, no gait change or falls. Also with urinary frequency for several days, Denies urinary symptoms such as dysuria, urgency, flank pain, hematuria or n/v, fever, chills. Past Medical History  Diagnosis Date  . DIABETES MELLITUS, TYPE II   . HYPERLIPIDEMIA   . ANXIETY   . DEPRESSION   . COMMON MIGRAINE   . HYPERTENSION   . POLYP, GALLBLADDER   . GERD (gastroesophageal reflux disease) 10/17/2012   Past Surgical History  Procedure Laterality Date  . Abdominal hysterectomy  2000  . Lumbar disc surgery  1998    s/p    reports that she has never smoked. She has never used smokeless tobacco. She reports that she does not drink alcohol or use illicit drugs. family history includes Alcohol abuse in her other; Arthritis in her other; Diabetes in her mother; Heart disease in her father; Stroke in her other. No Known Allergies Current Outpatient Prescriptions on File Prior to Visit  Medication Sig Dispense Refill  . ALREX 0.2 % SUSP Use 1 drop in both eyes twice daily    . azelastine (ASTELIN) 0.1 % nasal spray   0  . azelastine (OPTIVAR) 0.05 % ophthalmic solution Place 1 drop into both eyes 2 (two) times daily. 6  mL 12  . diphenhydrAMINE (BENADRYL) 25 MG tablet Take 25 mg by mouth daily as needed.    Marland Kitchen EPINEPHrine (EPI-PEN) 0.3 mg/0.3 mL SOAJ injection Inject 0.3 mLs (0.3 mg total) into the muscle once. 1 Device 0  . FLUoxetine (PROZAC) 20 MG capsule Take 1 capsule (20 mg total) by mouth daily. 90 capsule 2  . glipiZIDE (GLIPIZIDE XL) 5 MG 24 hr tablet Take 1 tablet (5 mg total) by mouth daily with breakfast. 90 tablet 3  . glucose blood (ACCU-CHEK ACTIVE STRIPS) test strip Use as directed once daily to check blood sugar. 100 each 11  . Lancets (FREESTYLE) lancets Use as instructed 1 once daily 100 each 11  . lisinopril (PRINIVIL,ZESTRIL) 10 MG tablet TAKE ONE TABLET BY MOUTH ONCE DAILY 90 tablet 1  . lovastatin (MEVACOR) 40 MG tablet TAKE ONE TABLET BY MOUTH ONCE DAILY 90 tablet 1  . metFORMIN (GLUCOPHAGE) 1000 MG tablet Take 1 tablet (1,000 mg total) by mouth 2 (two) times daily with a meal. 180 tablet 3  . Olopatadine HCl 0.6 % SOLN   0  . phenylephrine (SUDAFED PE) 10 MG TABS tablet Take 10 mg by mouth daily.    Marland Kitchen doxycycline (VIBRAMYCIN) 100 MG capsule   0   No current facility-administered medications on file prior to visit.   Review of Systems  Constitutional: Negative for unusual diaphoresis or night sweats HENT: Negative for ringing in ear or  discharge Eyes: Negative for double vision or worsening visual disturbance.  Respiratory: Negative for choking and stridor.   Gastrointestinal: Negative for vomiting or other signifcant bowel change Genitourinary: Negative for hematuria or change in urine volume.  Musculoskeletal: Negative for other MSK pain or swelling Skin: Negative for color change and worsening wound.  Neurological: Negative for tremors and numbness other than noted  Psychiatric/Behavioral: Negative for decreased concentration or agitation other than above       Objective:   Physical Exam BP 124/84 mmHg  Pulse 100  Temp(Src) 98.1 F (36.7 C) (Oral)  Ht  (1.575 m)  Wt  123 lb (55.792 kg)  BMI 22.49 kg/m2  SpO2 96% VS noted, non toxic Constitutional: Pt appears in no significant distress HENT: Head: NCAT.  Right Ear: External ear normal.  Left Ear: External ear normal.  Eyes: . Pupils are equal, round, and reactive to light. Conjunctivae and EOM are normal Neck: Normal range of motion. Neck supple.  Cardiovascular: Normal rate and regular rhythm.   Pulmonary/Chest: Effort normal and breath sounds without rales or wheezing.  Abd:  Soft, NT, ND, + BS, no flank tender or lumbar paraverebral tender  Neurological: Pt is alert. Not confused , motor 5/5 intact, dtr symmtric, sens intact Skin: Skin is warm. No rash, no LE edema Psychiatric: Pt behavior is normal. No agitation.     Assessment & Plan:

## 2015-06-04 NOTE — Assessment & Plan Note (Signed)
stable overall by history and exam, recent data reviewed with pt, and pt to continue medical treatment as before,  to f/u any worsening symptoms or concerns BP Readings from Last 3 Encounters:  05/30/15 124/84  04/19/15 104/68  03/02/15 132/84

## 2015-06-04 NOTE — Assessment & Plan Note (Signed)
?   Significance, for UA,  to f/u any worsening symptoms or concerns

## 2015-06-04 NOTE — Assessment & Plan Note (Addendum)
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

## 2015-06-04 NOTE — Assessment & Plan Note (Signed)
stable overall by history and exam, recent data reviewed with pt, and pt to continue medical treatment as before,  to f/u any worsening symptoms or concerns Lab Results  Component Value Date   HGBA1C 7.1* 03/02/2015

## 2015-06-29 ENCOUNTER — Telehealth: Payer: Self-pay | Admitting: Internal Medicine

## 2015-06-29 NOTE — Telephone Encounter (Signed)
Tried to contact.  No VM.

## 2015-06-29 NOTE — Telephone Encounter (Signed)
Ok with me, but please be aware that I do not practice chronic pain treatment such as with regular narcotic use such as hydrocodone, or oxycodone

## 2015-06-29 NOTE — Telephone Encounter (Signed)
Patient is requesting Dr. Jonny RuizJohn to take her mother on who just moved here out of town.  Please advise.

## 2015-07-24 ENCOUNTER — Encounter: Payer: Self-pay | Admitting: Internal Medicine

## 2015-08-02 ENCOUNTER — Other Ambulatory Visit: Payer: Self-pay | Admitting: Internal Medicine

## 2015-08-22 ENCOUNTER — Encounter: Payer: Self-pay | Admitting: Internal Medicine

## 2015-08-22 ENCOUNTER — Ambulatory Visit (INDEPENDENT_AMBULATORY_CARE_PROVIDER_SITE_OTHER): Payer: BLUE CROSS/BLUE SHIELD | Admitting: Internal Medicine

## 2015-08-22 ENCOUNTER — Other Ambulatory Visit (INDEPENDENT_AMBULATORY_CARE_PROVIDER_SITE_OTHER): Payer: BLUE CROSS/BLUE SHIELD

## 2015-08-22 VITALS — BP 138/90 | HR 77 | Temp 98.7°F | Ht 62.0 in | Wt 127.0 lb

## 2015-08-22 DIAGNOSIS — E785 Hyperlipidemia, unspecified: Secondary | ICD-10-CM | POA: Diagnosis not present

## 2015-08-22 DIAGNOSIS — E119 Type 2 diabetes mellitus without complications: Secondary | ICD-10-CM

## 2015-08-22 DIAGNOSIS — Z0000 Encounter for general adult medical examination without abnormal findings: Secondary | ICD-10-CM

## 2015-08-22 DIAGNOSIS — Z0189 Encounter for other specified special examinations: Secondary | ICD-10-CM | POA: Diagnosis not present

## 2015-08-22 DIAGNOSIS — I10 Essential (primary) hypertension: Secondary | ICD-10-CM | POA: Diagnosis not present

## 2015-08-22 LAB — LIPID PANEL
CHOL/HDL RATIO: 2
Cholesterol: 154 mg/dL (ref 0–200)
HDL: 65.2 mg/dL (ref >39.00)
LDL Cholesterol: 70 mg/dL (ref 0–99)
NONHDL: 88.97
Triglycerides: 97 mg/dL (ref 0.0–149.0)
VLDL: 19.4 mg/dL (ref 0.0–40.0)

## 2015-08-22 LAB — BASIC METABOLIC PANEL
BUN: 16 mg/dL (ref 6–23)
CHLORIDE: 102 meq/L (ref 96–112)
CO2: 32 mEq/L (ref 19–32)
CREATININE: 0.67 mg/dL (ref 0.40–1.20)
Calcium: 9.8 mg/dL (ref 8.4–10.5)
GFR: 115.66 mL/min (ref >60.00)
Glucose, Bld: 176 mg/dL — ABNORMAL HIGH (ref 70–99)
POTASSIUM: 3.8 meq/L (ref 3.5–5.1)
SODIUM: 141 meq/L (ref 135–145)

## 2015-08-22 LAB — HEPATIC FUNCTION PANEL
ALT: 9 U/L (ref 0–35)
AST: 10 U/L (ref 0–37)
Albumin: 4.4 g/dL (ref 3.5–5.2)
Alkaline Phosphatase: 63 U/L (ref 39–117)
BILIRUBIN DIRECT: 0 mg/dL (ref 0.0–0.3)
BILIRUBIN TOTAL: 0.4 mg/dL (ref 0.2–1.2)
Total Protein: 7.6 g/dL (ref 6.0–8.3)

## 2015-08-22 LAB — HEMOGLOBIN A1C: Hgb A1c MFr Bld: 7 % — ABNORMAL HIGH (ref 4.6–6.5)

## 2015-08-22 MED ORDER — GLIPIZIDE ER 5 MG PO TB24: 5.0000 mg | ORAL_TABLET | Freq: Two times a day (BID) | ORAL | Status: DC

## 2015-08-22 NOTE — Assessment & Plan Note (Signed)
stable overall by history and exam, recent data reviewed with pt, and pt to continue medical treatment as before,  to f/u any worsening symptoms or concerns Lab Results  Component Value Date   LDLCALC 48 03/02/2015

## 2015-08-22 NOTE — Patient Instructions (Signed)
While taking the steroid treatments, ok to increase the glipizide XL 5 mg to twice per day  OK to reduce the glipizide XL 5 mg to once per day again when the sugars improve after the steroids wear off  Please continue all other medications as before, and refills have been done if requested.  Please have the pharmacy call with any other refills you may need.  Please continue your efforts at being more active, low cholesterol diet, and weight control.  Please keep your appointments with your specialists as you may have planned  We have cancelled your appointment for next wk  Please go to the LAB in the Basement (turn left off the elevator) for the tests to be done today  You will be contacted by phone if any changes need to be made immediately.  Otherwise, you will receive a letter about your results with an explanation, but please check with MyChart first.  Please remember to sign up for MyChart if you have not done so, as this will be important to you in the future with finding out test results, communicating by private email, and scheduling acute appointments online when needed.  Please return in 6 months, or sooner if needed, with Lab testing done 3-5 days before

## 2015-08-22 NOTE — Assessment & Plan Note (Signed)
stable overall by history and exam, recent data reviewed with pt, and pt to continue medical treatment as before,  to f/u any worsening symptoms or concerns BP Readings from Last 3 Encounters:  08/22/15 138/90  05/30/15 124/84  04/19/15 104/68

## 2015-08-22 NOTE — Progress Notes (Signed)
Pre visit review using our clinic review tool, if applicable. No additional management support is needed unless otherwise documented below in the visit note. 

## 2015-08-22 NOTE — Progress Notes (Signed)
Subjective:    Patient ID: Ruth Crawford, female    DOB: 1956-06-03, 59 y.o.   MRN: [id number]  HPI  Here after back surgury with persistent pain, now seeing pain management, on prednisone intermittently, also given cortisone shot , sounds like left ESI, now with sugars 142 to 250's, assoc with some blurry vision.   Pt denies chest pain, increasing sob or doe, wheezing, orthopnea, PND, increased LE swelling, palpitations, dizziness or syncope.  Pt denies new neurological symptoms such as new headache, or facial or extremity weakness or numbness.  Pt denies polydipsia, polyuria, or low sugar episode.   Pt denies new neurological symptoms such as new headache, or facial or extremity weakness or numbness.   Pt states overall good compliance with meds, mostly trying to follow appropriate diet, with wt overall stable,  but little exercise however., due to recent back surgury and chronic pain Past Medical History  Diagnosis Date  . DIABETES MELLITUS, TYPE II   . HYPERLIPIDEMIA   . ANXIETY   . DEPRESSION   . COMMON MIGRAINE   . HYPERTENSION   . POLYP, GALLBLADDER   . GERD (gastroesophageal reflux disease) 10/17/2012   Past Surgical History  Procedure Laterality Date  . Abdominal hysterectomy  2000  . Lumbar disc surgery  1998    s/p    reports that she has never smoked. She has never used smokeless tobacco. She reports that she does not drink alcohol or use illicit drugs. family history includes Alcohol abuse in her other; Arthritis in her other; Diabetes in her mother; Heart disease in her father; Stroke in her other. No Known Allergies Current Outpatient Prescriptions on File Prior to Visit  Medication Sig Dispense Refill  . ALREX 0.2 % SUSP Use 1 drop in both eyes twice daily    . azelastine (ASTELIN) 0.1 % nasal spray   0  . azelastine (OPTIVAR) 0.05 % ophthalmic solution Place 1 drop into both eyes 2 (two) times daily. 6 mL 12  . cyclobenzaprine (FLEXERIL) 10 MG tablet   0  . diclofenac  (VOLTAREN) 75 MG EC tablet Take 1 tablet (75 mg total) by mouth 2 (two) times daily. As needed for pain 60 tablet 5  . diphenhydrAMINE (BENADRYL) 25 MG tablet Take 25 mg by mouth daily as needed.    . doxycycline (VIBRAMYCIN) 100 MG capsule   0  . EPINEPHrine (EPI-PEN) 0.3 mg/0.3 mL SOAJ injection Inject 0.3 mLs (0.3 mg total) into the muscle once. 1 Device 0  . FLUoxetine (PROZAC) 20 MG capsule Take 1 capsule (20 mg total) by mouth daily. 90 capsule 2  . gabapentin (NEURONTIN) 100 MG capsule   0  . glucose blood (ACCU-CHEK ACTIVE STRIPS) test strip Use as directed once daily to check blood sugar. 100 each 11  . Hydrocodone-Acetaminophen 5-300 MG TABS   0  . Lancets (FREESTYLE) lancets Use as instructed 1 once daily 100 each 11  . lisinopril (PRINIVIL,ZESTRIL) 10 MG tablet TAKE ONE TABLET BY MOUTH ONCE DAILY 90 tablet 1  . lovastatin (MEVACOR) 40 MG tablet TAKE ONE TABLET BY MOUTH ONCE DAILY 90 tablet 1  . metFORMIN (GLUCOPHAGE) 1000 MG tablet Take 1 tablet (1,000 mg total) by mouth 2 (two) times daily with a meal. 180 tablet 3  . Olopatadine HCl 0.6 % SOLN   0  . phenylephrine (SUDAFED PE) 10 MG TABS tablet Take 10 mg by mouth daily.     No current facility-administered medications on file prior to visit.  Review of Systems  Constitutional: Negative for unusual diaphoresis or night sweats HENT: Negative for ringing in ear or discharge Eyes: Negative for double vision or worsening visual disturbance.  Respiratory: Negative for choking and stridor.   Gastrointestinal: Negative for vomiting or other signifcant bowel change Genitourinary: Negative for hematuria or change in urine volume.  Musculoskeletal: Negative for other MSK pain or swelling Skin: Negative for color change and worsening wound.  Neurological: Negative for tremors and numbness other than noted  Psychiatric/Behavioral: Negative for decreased concentration or agitation other than above       Objective:   Physical Exam BP  138/90 mmHg  Pulse 77  Temp(Src) 98.7 F (37.1 C) (Oral)  Ht 5\' 2"  (1.575 m)  Wt 127 lb (57.607 kg)  BMI 23.22 kg/m2  SpO2 98% VS noted,  Constitutional: Pt appears in no significant distress HENT: Head: NCAT.  Right Ear: External ear normal.  Left Ear: External ear normal.  Eyes: . Pupils are equal, round, and reactive to light. Conjunctivae and EOM are normal Neck: Normal range of motion. Neck supple.  Cardiovascular: Normal rate and regular rhythm.   Pulmonary/Chest: Effort normal and breath sounds without rales or wheezing.  Abd:  Soft, NT, ND, + BS Neurological: Pt is alert. Not confused , motor grossly intact Skin: Skin is warm. No rash, no LE edema Psychiatric: Pt behavior is normal. No agitation.     Assessment & Plan:

## 2015-08-22 NOTE — Assessment & Plan Note (Signed)
With mild uncontrolled recent due to steroids, for increased glpizide xl 5 mg to  Bid until sugars improved, then back to 5 XL qd after that, cont all other meds, if not approved by insurance would suggest add januvia or onglyza,  to f/u any worsening symptoms or concerns, Lab Results  Component Value Date   HGBA1C 7.1* 03/02/2015   For f/u labs today

## 2015-09-01 ENCOUNTER — Ambulatory Visit: Payer: BLUE CROSS/BLUE SHIELD | Admitting: Internal Medicine

## 2015-09-15 ENCOUNTER — Encounter: Payer: Self-pay | Admitting: Internal Medicine

## 2015-10-18 ENCOUNTER — Encounter: Payer: Self-pay | Admitting: Internal Medicine

## 2015-10-18 ENCOUNTER — Ambulatory Visit (INDEPENDENT_AMBULATORY_CARE_PROVIDER_SITE_OTHER): Payer: BLUE CROSS/BLUE SHIELD | Admitting: Internal Medicine

## 2015-10-18 VITALS — BP 132/80 | HR 98 | Temp 98.8°F | Resp 20 | Ht 62.0 in | Wt 128.0 lb

## 2015-10-18 DIAGNOSIS — I10 Essential (primary) hypertension: Secondary | ICD-10-CM

## 2015-10-18 DIAGNOSIS — R21 Rash and other nonspecific skin eruption: Secondary | ICD-10-CM

## 2015-10-18 DIAGNOSIS — E119 Type 2 diabetes mellitus without complications: Secondary | ICD-10-CM | POA: Diagnosis not present

## 2015-10-18 MED ORDER — METHYLPREDNISOLONE ACETATE 80 MG/ML IJ SUSP
80.0000 mg | Freq: Once | INTRAMUSCULAR | Status: AC
  Administered 2015-10-18: 80 mg via INTRAMUSCULAR

## 2015-10-18 MED ORDER — TRIAMCINOLONE ACETONIDE 0.1 % EX CREA: 1.0000 "application " | TOPICAL_CREAM | Freq: Two times a day (BID) | CUTANEOUS | Status: DC

## 2015-10-18 MED ORDER — PREDNISONE 10 MG PO TABS: ORAL_TABLET | ORAL | Status: DC

## 2015-10-18 NOTE — Patient Instructions (Signed)
You had the steroid shot today  /Please take all new medication as prescribed  - the prednisone, and the cream  Please continue all other medications as before, and refills have been done if requested.  Please have the pharmacy call with any other refills you may need.  Please keep your appointments with your specialists as you may have planned     

## 2015-10-18 NOTE — Progress Notes (Signed)
Subjective:    Patient ID: Ruth Crawford, female    DOB: 1956-08-18, 60 y.o.   MRN: [id number]  HPI  Here to f/u, overall doing ok recently S/p third lumbar surgury, with postop dressing with sticky backing applied same as before, but this time when removed had onset markedly itchy rash after came off, now spreading it seems with scratching to most of the lumbar area.  No other contact allergens.  Not sure if she had benzoin prep prior to start surgury.   Pt denies fever, wt loss, night sweats, loss of appetite, or other constitutional symptoms  Pt denies chest pain, increased sob or doe, wheezing, orthopnea, PND, increased LE swelling, palpitations, dizziness or syncope.   Pt denies polydipsia, polyuria Past Medical History  Diagnosis Date  . DIABETES MELLITUS, TYPE II   . HYPERLIPIDEMIA   . ANXIETY   . DEPRESSION   . COMMON MIGRAINE   . HYPERTENSION   . POLYP, GALLBLADDER   . GERD (gastroesophageal reflux disease) 10/17/2012   Past Surgical History  Procedure Laterality Date  . Abdominal hysterectomy  2000  . Lumbar disc surgery  1998    s/p    reports that she has never smoked. She has never used smokeless tobacco. She reports that she does not drink alcohol or use illicit drugs. family history includes Alcohol abuse in her other; Arthritis in her other; Diabetes in her mother; Heart disease in her father; Stroke in her other. No Known Allergies Current Outpatient Prescriptions on File Prior to Visit  Medication Sig Dispense Refill  . ALREX 0.2 % SUSP Use 1 drop in both eyes twice daily    . azelastine (ASTELIN) 0.1 % nasal spray   0  . azelastine (OPTIVAR) 0.05 % ophthalmic solution Place 1 drop into both eyes 2 (two) times daily. 6 mL 12  . cyclobenzaprine (FLEXERIL) 10 MG tablet   0  . diclofenac (VOLTAREN) 75 MG EC tablet Take 1 tablet (75 mg total) by mouth 2 (two) times daily. As needed for pain 60 tablet 5  . diphenhydrAMINE (BENADRYL) 25 MG tablet Take 25 mg by mouth daily  as needed.    . doxycycline (VIBRAMYCIN) 100 MG capsule   0  . EPINEPHrine (EPI-PEN) 0.3 mg/0.3 mL SOAJ injection Inject 0.3 mLs (0.3 mg total) into the muscle once. 1 Device 0  . FLUoxetine (PROZAC) 20 MG capsule Take 1 capsule (20 mg total) by mouth daily. 90 capsule 2  . gabapentin (NEURONTIN) 100 MG capsule   0  . glipiZIDE (GLIPIZIDE XL) 5 MG 24 hr tablet Take 1 tablet (5 mg total) by mouth 2 (two) times daily. 180 tablet 3  . glucose blood (ACCU-CHEK ACTIVE STRIPS) test strip Use as directed once daily to check blood sugar. 100 each 11  . Hydrocodone-Acetaminophen 5-300 MG TABS   0  . Lancets (FREESTYLE) lancets Use as instructed 1 once daily 100 each 11  . lisinopril (PRINIVIL,ZESTRIL) 10 MG tablet TAKE ONE TABLET BY MOUTH ONCE DAILY 90 tablet 1  . lovastatin (MEVACOR) 40 MG tablet TAKE ONE TABLET BY MOUTH ONCE DAILY 90 tablet 1  . metFORMIN (GLUCOPHAGE) 1000 MG tablet Take 1 tablet (1,000 mg total) by mouth 2 (two) times daily with a meal. 180 tablet 3  . Olopatadine HCl 0.6 % SOLN   0  . phenylephrine (SUDAFED PE) 10 MG TABS tablet Take 10 mg by mouth daily.     No current facility-administered medications on file prior to visit.  Review of Systems  Constitutional: Negative for unusual diaphoresis or night sweats HENT: Negative for ringing in ear or discharge Eyes: Negative for double vision or worsening visual disturbance.  Respiratory: Negative for choking and stridor.   Gastrointestinal: Negative for vomiting or other signifcant bowel change Genitourinary: Negative for hematuria or change in urine volume.  Musculoskeletal: Negative for other MSK pain or swelling Skin: Negative for color change and worsening wound.  Neurological: Negative for tremors and numbness other than noted  Psychiatric/Behavioral: Negative for decreased concentration or agitation other than above       Objective:   Physical Exam BP 132/80 mmHg  Pulse 98  Temp(Src) 98.8 F (37.1 C) (Oral)  Resp  20  Ht  (1.575 m)  Wt 128 lb (58.06 kg)  BMI 23.41 kg/m2  SpO2 97% VS noted,  Constitutional: Pt appears in no significant distress HENT: Head: NCAT.  Right Ear: External ear normal.  Left Ear: External ear normal.  Eyes: . Pupils are equal, round, and reactive to light. Conjunctivae and EOM are normal Neck: Normal range of motion. Neck supple.  Cardiovascular: Normal rate and regular rhythm.   Pulmonary/Chest: Effort normal and breath sounds without rales or wheezing.  Neurological: Pt is alert. Not confused , motor grossly intact Skin: Skin is warm. no LE edema, but has large area in upside down Vshape to most all of the lower back centered at the op site, which remains intact, without significant erythema/swelling/tender or drainage Psychiatric: Pt behavior is normal. No agitation.     Assessment & Plan:

## 2015-10-18 NOTE — Assessment & Plan Note (Addendum)
stable overall by history and exam, recent data reviewed with pt, and pt to continue medical treatment as before,  to f/u any worsening symptoms or concerns Lab Results  Component Value Date   HGBA1C 7.0* 08/22/2015   Pt to call for onset polys or cbg > 200 with steroid tx

## 2015-10-18 NOTE — Progress Notes (Signed)
Pre visit review using our clinic review tool, if applicable. No additional management support is needed unless otherwise documented below in the visit note. 

## 2015-10-18 NOTE — Assessment & Plan Note (Signed)
I suspect contact allergen type rash either to the benzoin prep if used, or the dressing adhesive; now spreading with scratching to most of the lower back large area; non tender and afeb, doubt infection or even fungal or heat rash, for depomedrol IM, predpac asd, and triam cr prn,  to f/u any worsening symptoms or concerns

## 2015-10-18 NOTE — Assessment & Plan Note (Signed)
stable overall by history and exam, recent data reviewed with pt, and pt to continue medical treatment as before,  to f/u any worsening symptoms or concerns BP Readings from Last 3 Encounters:  10/18/15 132/80  08/22/15 138/90  05/30/15 124/84

## 2015-10-20 ENCOUNTER — Ambulatory Visit: Payer: BLUE CROSS/BLUE SHIELD | Admitting: Internal Medicine

## 2015-10-22 ENCOUNTER — Encounter: Payer: Self-pay | Admitting: Internal Medicine

## 2015-10-22 DIAGNOSIS — R21 Rash and other nonspecific skin eruption: Secondary | ICD-10-CM

## 2015-10-23 MED ORDER — PREDNISONE 10 MG PO TABS: ORAL_TABLET | ORAL | Status: DC

## 2015-10-24 ENCOUNTER — Encounter: Payer: Self-pay | Admitting: Internal Medicine

## 2015-10-25 NOTE — Addendum Note (Signed)
Addended by: Corwin Levins on: 10/25/2015 09:46 PM   Modules accepted: Orders

## 2015-12-06 ENCOUNTER — Encounter: Payer: Self-pay | Admitting: Internal Medicine

## 2015-12-19 DIAGNOSIS — M5382 Other specified dorsopathies, cervical region: Secondary | ICD-10-CM | POA: Diagnosis not present

## 2015-12-19 DIAGNOSIS — M4602 Spinal enthesopathy, cervical region: Secondary | ICD-10-CM | POA: Diagnosis not present

## 2015-12-21 DIAGNOSIS — M5382 Other specified dorsopathies, cervical region: Secondary | ICD-10-CM | POA: Diagnosis not present

## 2015-12-21 DIAGNOSIS — M4602 Spinal enthesopathy, cervical region: Secondary | ICD-10-CM | POA: Diagnosis not present

## 2015-12-26 DIAGNOSIS — M5382 Other specified dorsopathies, cervical region: Secondary | ICD-10-CM | POA: Diagnosis not present

## 2015-12-26 DIAGNOSIS — M4602 Spinal enthesopathy, cervical region: Secondary | ICD-10-CM | POA: Diagnosis not present

## 2016-01-04 DIAGNOSIS — M5384 Other specified dorsopathies, thoracic region: Secondary | ICD-10-CM | POA: Diagnosis not present

## 2016-01-04 DIAGNOSIS — M4604 Spinal enthesopathy, thoracic region: Secondary | ICD-10-CM | POA: Diagnosis not present

## 2016-01-29 DIAGNOSIS — M545 Low back pain: Secondary | ICD-10-CM | POA: Diagnosis not present

## 2016-02-01 ENCOUNTER — Ambulatory Visit (INDEPENDENT_AMBULATORY_CARE_PROVIDER_SITE_OTHER): Payer: BLUE CROSS/BLUE SHIELD | Admitting: Internal Medicine

## 2016-02-01 ENCOUNTER — Encounter: Payer: Self-pay | Admitting: Internal Medicine

## 2016-02-01 VITALS — BP 120/76 | HR 90 | Ht 62.0 in | Wt 128.2 lb

## 2016-02-01 DIAGNOSIS — J3089 Other allergic rhinitis: Secondary | ICD-10-CM

## 2016-02-01 DIAGNOSIS — J302 Other seasonal allergic rhinitis: Secondary | ICD-10-CM

## 2016-02-01 NOTE — Patient Instructions (Signed)
Ok to continue as you are doing for now  Please call if we can help

## 2016-02-01 NOTE — Progress Notes (Signed)
08/20/13- [age] yoF never smoker Ref by Dr Oliver BarreJames John - Eye redness and pain comes and goes - Some runny eyes - Denies itching - Occas Headaches - Denies sneezing She complains of red and painful eyes, persistent over 6 months but relieved by steroid eyedrops. Optometrist told her it might be allergy. She does not wear contact lenses. She improves 4 days or a week at a time but then comes back. No recent antihistamine. History of frequent sinus headaches. Spring and fall seasonal rhinitis treated over-the-counter. No history of asthma. Associates rash with anxiety. Denies any problems from lisinopril. Vision is okay. Denies arthritis. Environment: House with no basement, lactic heat, no mold. 2 dogs. No smokers. She is not working. Family history of allergy.  10/01/13-  60 yoF never smoker Ref by Dr Oliver BarreJames John - Eye redness and pain comes and goes - Some runny eyes - Denies itching - Occas Headaches - Denies sneezing FOLLOWS FOR: States that her allergy sx have not changed since last OV. She did not try lubricating gel for her eyes. Still has the same intermittent burning and watering.antihistamines and various nasal sprays have not helped much.  Now describes seasonal rhinitis since childhood. Generalized itching without visible rash intermittently since her eyes began bothering her.  Lab 08/20/2013-  ANA negative, sedimentation rate only 23, Allergy Profile 12//14- total IgE 353.1, broadly positive. We discussed a trial of allergy vaccine based on this.  12/07/13- 57 yoF never smoker followed for allergic conjunctivitis, allergic rhinitis, food allergy-banana, watermelon FOLLOWS FOR: doing well overall with allergies,still taking allergy shot, ha occass., nasal congestion yesterday-clear,no cough,denies wheezing Allergy vaccine 1:5000 build-up here Not much conjunctivitis so far this spring. Occasional Benadryl. Minor hacking cough in the mornings but no wheeze. Banana and watermelon make her throat   Itch  03/25/14- 60 yoF never smoker followed for allergic conjunctivitis, allergic rhinitis, food allergy-banana, watermelon FOLLOWS FOR: still on Allergy vaccine 1:50 GH   and doing well. Her previous complaints about cough and irritation of her eyes are much better now.  07/29/14- 60 yoF never smoker followed for allergic conjunctivitis, allergic rhinitis, food allergy-banana, watermelon FOLLOWS FOR: Still on Allergy vaccine 1:10 GH-cant tell that they are helping and would like to discuss coming off of them. Got shot Wednesday this week and has a bruise, soreness, red area-put cool compress and Benadryl anti itch cream on it.  She has been on allergy vaccine about a year. Currently describing frontal headaches and sinus pressure. She takes an antihistamine +2 Excedrin to relieve headache. Not blowing her nose or sneezing. Bothersome postnasal drip when she first lies down. We are stopping her allergy vaccine..  01/27/15- 60 yoF never smoker followed for allergic conjunctivitis, allergic rhinitis, food allergy-banana, watermelon Follows For: Pt has had a cold x 2 weeks. C/o of right sided ear and jaw pain, prod cough with clear/yellow mucus. Pt wants to talk about new herbal med flora sinus for allergies. Allergy vaccine dc/d 07/29/14 CT sinus 08/05/15 IMPRESSION: No significant sinus fluid. Nasal septal deviation of 2 mm. Electronically Signed  By: Davonna BellingJohn Curnes M.D.  On: 08/04/2014 10:  02/01/2016-60 year old female never smoker followed for allergic conjunctivitis, allergic rhinitis, food allergy-banana, watermelon FOLLOWS FOR: Pt states she has allergies year round; continues using OTC allergy med and Excedrin Migraine prn for headaches-this helps hold her over.    ROS-see HPI Constitutional:   No-   weight loss, night sweats, fevers, chills, fatigue, lassitude. HEENT:   +  headaches, no-difficulty swallowing,  tooth/dental problems, sore throat,       No-  sneezing, +itching, no-ear  ache, +nasal congestion, +post nasal drip,  CV:  No-   chest pain, orthopnea, PND, swelling in lower extremities, anasarca, dizziness, palpitations Resp: No-   shortness of breath with exertion or at rest.              No-   productive cough,  No non-productive cough,  No- coughing up of blood.              No-   change in color of mucus.  No- wheezing.   Skin: +occasional rash . GI:  No-   heartburn, indigestion, abdominal pain, nausea, vomiting,  GU:  MS:  No-   joint pain or swelling.  No- decreased range of motion.  Neuro-     nothing unusual Psych:  No- change in mood or affect. No depression or anxiety.  No memory loss.  OBJ- Physical Exam General- Alert, Oriented, Affect-appropriate, Distress- none acute Skin- rash-none, lesions- none, excoriation- none Lymphadenopathy- none Head- atraumatic            Eyes- Gross vision intact, PERRLA, conjunctivae-not red/ injected                            + periorbital edema            Ears- +R canal and TM red, not bulging            Nose- Clear, no-Septal dev, mucus, polyps, erosion, perforation             Throat- Mallampati II , mucosa clear , drainage- none, tonsils- atrophic Neck- flexible , trachea midline, no stridor , thyroid nl, carotid no bruit Chest - symmetrical excursion , unlabored           Heart/CV- RRR , no murmur , no gallop  , no rub, nl s1 s2                           - JVD- none , edema- none, stasis changes- none, varices- none           Lung- clear to P&A, wheeze- none, cough- none , dullness-none, rub- none           Chest wall-  Abd-  Br/ Gen/ Rectal- Not done, not indicated Extrem- cyanosis- none, clubbing, none, atrophy- none, strength- nl Neuro- grossly intact to observation

## 2016-02-23 ENCOUNTER — Ambulatory Visit: Payer: BLUE CROSS/BLUE SHIELD | Admitting: Internal Medicine

## 2016-02-28 ENCOUNTER — Encounter: Payer: Self-pay | Admitting: Internal Medicine

## 2016-02-28 ENCOUNTER — Ambulatory Visit (INDEPENDENT_AMBULATORY_CARE_PROVIDER_SITE_OTHER)
Admission: RE | Admit: 2016-02-28 | Discharge: 2016-02-28 | Disposition: A | Discharge status: Home / Self Care | Payer: BLUE CROSS/BLUE SHIELD | Source: Ambulatory Visit | Attending: Internal Medicine | Admitting: Internal Medicine

## 2016-02-28 ENCOUNTER — Other Ambulatory Visit (INDEPENDENT_AMBULATORY_CARE_PROVIDER_SITE_OTHER): Payer: BLUE CROSS/BLUE SHIELD

## 2016-02-28 ENCOUNTER — Ambulatory Visit (INDEPENDENT_AMBULATORY_CARE_PROVIDER_SITE_OTHER): Payer: BLUE CROSS/BLUE SHIELD | Admitting: Internal Medicine

## 2016-02-28 VITALS — BP 122/84 | HR 86 | Temp 98.1°F | Resp 20 | Wt 128.0 lb

## 2016-02-28 DIAGNOSIS — F411 Generalized anxiety disorder: Secondary | ICD-10-CM | POA: Diagnosis not present

## 2016-02-28 DIAGNOSIS — Z1211 Encounter for screening for malignant neoplasm of colon: Secondary | ICD-10-CM

## 2016-02-28 DIAGNOSIS — Z0001 Encounter for general adult medical examination with abnormal findings: Secondary | ICD-10-CM | POA: Diagnosis not present

## 2016-02-28 DIAGNOSIS — S3992XA Unspecified injury of lower back, initial encounter: Secondary | ICD-10-CM | POA: Diagnosis not present

## 2016-02-28 DIAGNOSIS — E785 Hyperlipidemia, unspecified: Secondary | ICD-10-CM

## 2016-02-28 DIAGNOSIS — M533 Sacrococcygeal disorders, not elsewhere classified: Secondary | ICD-10-CM | POA: Diagnosis not present

## 2016-02-28 DIAGNOSIS — E119 Type 2 diabetes mellitus without complications: Secondary | ICD-10-CM

## 2016-02-28 DIAGNOSIS — R6889 Other general symptoms and signs: Secondary | ICD-10-CM

## 2016-02-28 DIAGNOSIS — Z1159 Encounter for screening for other viral diseases: Secondary | ICD-10-CM

## 2016-02-28 HISTORY — DX: Sacrococcygeal disorders, not elsewhere classified: M53.3

## 2016-02-28 LAB — CBC WITH DIFFERENTIAL/PLATELET
BASOS ABS: 0.1 10*3/uL (ref 0.0–0.1)
Basophils Relative: 1.5 % (ref 0.0–3.0)
EOS PCT: 2.9 % (ref 0.0–5.0)
Eosinophils Absolute: 0.3 10*3/uL (ref 0.0–0.7)
HCT: 36.1 % (ref 36.0–46.0)
HEMOGLOBIN: 11.8 g/dL — AB (ref 12.0–15.0)
LYMPHS PCT: 23.8 % (ref 12.0–46.0)
Lymphs Abs: 2.1 10*3/uL (ref 0.7–4.0)
MCHC: 32.5 g/dL (ref 30.0–36.0)
MCV: 79.2 fl (ref 78.0–100.0)
MONOS PCT: 5.8 % (ref 3.0–12.0)
Monocytes Absolute: 0.5 10*3/uL (ref 0.1–1.0)
Neutro Abs: 5.8 10*3/uL (ref 1.4–7.7)
Neutrophils Relative %: 66 % (ref 43.0–77.0)
Platelets: 414 10*3/uL — ABNORMAL HIGH (ref 150.0–400.0)
RBC: 4.56 Mil/uL (ref 3.87–5.11)
RDW: 15.8 % — ABNORMAL HIGH (ref 11.5–15.5)
WBC: 8.9 10*3/uL (ref 4.0–10.5)

## 2016-02-28 LAB — LIPID PANEL
CHOL/HDL RATIO: 2
Cholesterol: 142 mg/dL (ref 0–200)
HDL: 62.1 mg/dL (ref >39.00)
LDL Cholesterol: 64 mg/dL (ref 0–99)
NONHDL: 80.37
TRIGLYCERIDES: 83 mg/dL (ref 0.0–149.0)
VLDL: 16.6 mg/dL (ref 0.0–40.0)

## 2016-02-28 LAB — URINALYSIS, ROUTINE W REFLEX MICROSCOPIC
BILIRUBIN URINE: NEGATIVE
HGB URINE DIPSTICK: NEGATIVE
KETONES UR: NEGATIVE
NITRITE: NEGATIVE
PH: 5.5 (ref 5.0–8.0)
Specific Gravity, Urine: 1.02 (ref 1.000–1.030)
TOTAL PROTEIN, URINE-UPE24: NEGATIVE
UROBILINOGEN UA: 0.2 (ref 0.0–1.0)
Urine Glucose: >=1000 — AB

## 2016-02-28 LAB — BASIC METABOLIC PANEL
BUN: 10 mg/dL (ref 6–23)
CALCIUM: 9.5 mg/dL (ref 8.4–10.5)
CHLORIDE: 102 meq/L (ref 96–112)
CO2: 29 meq/L (ref 19–32)
CREATININE: 0.7 mg/dL (ref 0.40–1.20)
GFR: 109.77 mL/min (ref >60.00)
Glucose, Bld: 210 mg/dL — ABNORMAL HIGH (ref 70–99)
Potassium: 3.9 mEq/L (ref 3.5–5.1)
Sodium: 141 mEq/L (ref 135–145)

## 2016-02-28 LAB — HEPATIC FUNCTION PANEL
ALT: 10 U/L (ref 0–35)
AST: 12 U/L (ref 0–37)
Albumin: 4.5 g/dL (ref 3.5–5.2)
Alkaline Phosphatase: 53 U/L (ref 39–117)
BILIRUBIN TOTAL: 0.5 mg/dL (ref 0.2–1.2)
Bilirubin, Direct: 0 mg/dL (ref 0.0–0.3)
TOTAL PROTEIN: 7.4 g/dL (ref 6.0–8.3)

## 2016-02-28 LAB — MICROALBUMIN / CREATININE URINE RATIO
Creatinine,U: 113.1 mg/dL
MICROALB/CREAT RATIO: 1.7 mg/g (ref 0.0–30.0)
Microalb, Ur: 1.9 mg/dL (ref 0.0–1.9)

## 2016-02-28 LAB — TSH: TSH: 0.67 u[IU]/mL (ref 0.35–4.50)

## 2016-02-28 LAB — HEMOGLOBIN A1C: Hgb A1c MFr Bld: 6.9 % — ABNORMAL HIGH (ref 4.6–6.5)

## 2016-02-28 MED ORDER — LISINOPRIL 10 MG PO TABS: 10.0000 mg | ORAL_TABLET | Freq: Every day | ORAL | Status: DC

## 2016-02-28 MED ORDER — LOVASTATIN 40 MG PO TABS: 40.0000 mg | ORAL_TABLET | Freq: Every day | ORAL | Status: DC

## 2016-02-28 MED ORDER — FLUOXETINE HCL 20 MG PO CAPS: 20.0000 mg | ORAL_CAPSULE | Freq: Every day | ORAL | Status: DC

## 2016-02-28 NOTE — Assessment & Plan Note (Signed)
Ok to d/c prozac without wean,  to f/u any worsening symptoms or concerns

## 2016-02-28 NOTE — Patient Instructions (Addendum)
You had the Prevnar pneumonia shot today  OK to stop the prozac  Please continue all other medications as before, and refills have been done if requested.  Please have the pharmacy call with any other refills you may need.  Please continue your efforts at being more active, low cholesterol diet, and weight control.  You are otherwise up to date with prevention measures today.  Please keep your appointments with your specialists as you may have planned  Please go to the XRAY Department in the Basement (go straight as you get off the elevator) for the x-ray testing  You will be contacted regarding the referral for: colonoscopy  Please go to the LAB in the Basement (turn left off the elevator) for the tests to be done today  You will be contacted by phone if any changes need to be made immediately.  Otherwise, you will receive a letter about your results with an explanation, but please check with MyChart first.  Please remember to sign up for MyChart if you have not done so, as this will be important to you in the future with finding out test results, communicating by private email, and scheduling acute appointments online when needed.  Please return in 6 months, or sooner if needed, with Lab testing done 3-5 days before

## 2016-02-28 NOTE — Progress Notes (Signed)
Subjective:    Patient ID: Ruth Crawford, female    DOB: 1956/03/04, 60 y.o.   MRN: [id number]  HPI  Here for wellness and f/u;  Overall doing ok;  Pt denies Chest pain, worsening SOB, DOE, wheezing, orthopnea, PND, worsening LE edema, palpitations, dizziness or syncope.  Pt denies neurological change such as new headache, facial or extremity weakness.  Pt states overall good compliance with treatment and medications, good tolerability, and has been trying to follow appropriate diet.  Pt denies worsening depressive symptoms, suicidal ideation or panic. No fever, night sweats, wt loss, loss of appetite, or other constitutional symptoms.  Pt states good ability with ADL's, has low fall risk, home safety reviewed and adequate, no other significant changes in hearing or vision, and only occasionally active with exercise.   Pt denies polydipsia, polyuria, or low sugar symptoms such as weakness or confusion improved with po intake.  Pt states overall good compliance with meds, trying to follow lower cholesterol, diabetic diet, wt overall stable but little exercise however.   Sugars more labile recently, likely due to sugar craving and diet excess occas. Has less exercise recently due to recent fall.  Most recent cbg 123, yesterday 263.  Also Fell to coccyx on furniture 2 wks ago, pain still significant, sharp, moderate, S/p lumbar disc surgury but Pt continues to have recurring LBP, but no bowel or bladder change, fever, wt loss,  worsening LE pain/numbness/weakness, gait change.  Wants to try to stop prozac, recently denied life insurance  Denies worsening depressive symptoms, suicidal ideation, or panic Past Medical History  Diagnosis Date  . DIABETES MELLITUS, TYPE II   . HYPERLIPIDEMIA   . ANXIETY   . DEPRESSION   . COMMON MIGRAINE   . HYPERTENSION   . POLYP, GALLBLADDER   . GERD (gastroesophageal reflux disease) 10/17/2012   Past Surgical History  Procedure Laterality Date  . Abdominal  hysterectomy  2000  . Lumbar disc surgery  1998    s/p    reports that she has never smoked. She has never used smokeless tobacco. She reports that she does not drink alcohol or use illicit drugs. family history includes Alcohol abuse in her other; Arthritis in her other; Diabetes in her mother; Heart disease in her father; Stroke in her other. No Known Allergies Current Outpatient Prescriptions on File Prior to Visit  Medication Sig Dispense Refill  . ALREX 0.2 % SUSP Use 1 drop in both eyes twice daily    . diphenhydrAMINE (BENADRYL) 25 MG tablet Take 25 mg by mouth daily as needed.    Marland Kitchen glipiZIDE (GLIPIZIDE XL) 5 MG 24 hr tablet Take 1 tablet (5 mg total) by mouth 2 (two) times daily. 180 tablet 3  . glucose blood (ACCU-CHEK ACTIVE STRIPS) test strip Use as directed once daily to check blood sugar. 100 each 11  . Lancets (FREESTYLE) lancets Use as instructed 1 once daily 100 each 11  . metFORMIN (GLUCOPHAGE) 1000 MG tablet Take 1 tablet (1,000 mg total) by mouth 2 (two) times daily with a meal. 180 tablet 3  . phenylephrine (SUDAFED PE) 10 MG TABS tablet Take 10 mg by mouth daily.     No current facility-administered medications on file prior to visit.    Review of Systems Constitutional: Negative for increased diaphoresis, or other activity, appetite or siginficant weight change other than noted HENT: Negative for worsening hearing loss, ear pain, facial swelling, mouth sores and neck stiffness.   Eyes: Negative for other  worsening pain, redness or visual disturbance.  Respiratory: Negative for choking or stridor Cardiovascular: Negative for other chest pain and palpitations.  Gastrointestinal: Negative for worsening diarrhea, blood in stool, or abdominal distention Genitourinary: Negative for hematuria, flank pain or change in urine volume.  Musculoskeletal: Negative for myalgias or other joint complaints.  Skin: Negative for other color change and wound or drainage.  Neurological:  Negative for syncope and numbness. other than noted Hematological: Negative for adenopathy. or other swelling Psychiatric/Behavioral: Negative for hallucinations, SI, self-injury, decreased concentration or other worsening agitation.      Objective:   Physical Exam BP 122/84 mmHg  Pulse 86  Temp(Src) 98.1 F (36.7 C) (Oral)  Resp 20  Wt 128 lb (58.06 kg)  SpO2 95% VS noted,  Constitutional: Pt is oriented to person, place, and time. Appears well-developed and well-nourished, in no significant distress Head: Normocephalic and atraumatic  Eyes: Conjunctivae and EOM are normal. Pupils are equal, round, and reactive to light Right Ear: External ear normal.  Left Ear: External ear normal Nose: Nose normal.  Mouth/Throat: Oropharynx is clear and moist  Neck: Normal range of motion. Neck supple. No JVD present. No tracheal deviation present or significant neck LA or mass Cardiovascular: Normal rate, regular rhythm, normal heart sounds and intact distal pulses.   Pulmonary/Chest: Effort normal and breath sounds without rales or wheezing  Abdominal: Soft. Bowel sounds are normal. NT. No HSM  Musculoskeletal: Normal range of motion. Exhibits no edema Lymphadenopathy: Has no cervical adenopathy.  Neurological: Pt is alert and oriented to person, place, and time. Pt has normal reflexes. No cranial nerve deficit. Motor grossly intact Skin: Skin is warm and dry. No rash noted or new ulcers Psychiatric:  Has normal mood and affect. Behavior is normal.  Coccyx with mod tender without swelling, erythema, rash or ulcer    Assessment & Plan:

## 2016-02-28 NOTE — Progress Notes (Signed)
Pre visit review using our clinic review tool, if applicable. No additional management support is needed unless otherwise documented below in the visit note. 

## 2016-03-04 NOTE — Assessment & Plan Note (Signed)

## 2016-03-04 NOTE — Assessment & Plan Note (Signed)
stable overall by history and exam, recent data reviewed with pt, and pt to continue medical treatment as before,  to f/u any worsening symptoms or concerns Lab Results  Component Value Date   LDLCALC 64 02/28/2016

## 2016-03-04 NOTE — Assessment & Plan Note (Signed)
With some labile sugars recent, o/w stable overall by history and exam, recent data reviewed with pt, and pt to continue medical treatment as before,  to f/u any worsening symptoms or concerns Lab Results  Component Value Date   HGBA1C 6.9* 02/28/2016

## 2016-03-04 NOTE — Assessment & Plan Note (Addendum)
S/p fall to hard surface, with persistent pain, for xray r/o fx, cont pain control  In addition to the time spent performing CPE, I spent an additional 25 minutes face to face,in which greater than 50% of this time was spent in counseling and coordination of care for patient's acute illness as documented.

## 2016-03-25 ENCOUNTER — Telehealth: Payer: Self-pay | Admitting: Internal Medicine

## 2016-03-25 NOTE — Telephone Encounter (Signed)
PLEASE NOTE: All timestamps contained within this report are represented as [place] Standard Time. CONFIDENTIALTY NOTICE: This fax transmission is intended only for the addressee. It contains information that is legally privileged, confidential or otherwise protected from use or disclosure. If you are not the intended recipient, you are strictly prohibited from reviewing, disclosing, copying using or disseminating any of this information or taking any action in reliance on or regarding this information. If you have received this fax in error, please notify us immediately by telephone so that we can arrange for its return to us. Phone: [phone], Toll-Free: [phone], Fax: [phone] Page: 1 of 1 Call Id: [id number] [facility] - Client TELEPHONE ADVICE RECORD [facility] Patient Name: Dulce SellarSANDRA Mould DOB: 11/04/1955 Initial Comment caller states she has a swollen lymph node and its painful Nurse Assessment Nurse: Debera Latalston, RN, Tinnie GensJeffrey Date/Time Lamount Cohen(Eastern Time): 03/25/2016 9:55:19 AM Confirm and document reason for call. If symptomatic, describe symptoms. You must click the next button to save text entered. ---Caller states she has a swollen lymph node and its painful. Had symptoms for 2 days. No fever. Swollen node near ear and jaw on left side. Has the patient traveled out of the country within the last 30 days? ---No Does the patient have any new or worsening symptoms? ---Yes Will a triage be completed? ---Yes Related visit to physician within the last 2 weeks? ---No Does the PT have any chronic conditions? (i.e. diabetes, asthma, etc.) ---Yes List chronic conditions. ---HTN, Diabetes type 2, hyperlipidemia Is this a behavioral health or substance abuse call? ---No Guidelines Guideline Title Affirmed Question Affirmed Notes Lymph Nodes Swollen [1] Single large node AND [2] size > 1 inch (2.5 cm) AND [3] no fever Final Disposition User See  Physician within 24 Hours Debera Latalston, RN, [employer] LaboratoriesJeffrey Referrals REFERRED TO PCP OFFICE Disagree/Comply: Danella Maiersomply

## 2016-03-25 NOTE — Telephone Encounter (Signed)
Patient states she has made appt with dr Jonny Ruizjohn for tomorrow 7/11

## 2016-03-26 ENCOUNTER — Encounter: Payer: Self-pay | Admitting: Internal Medicine

## 2016-03-26 ENCOUNTER — Ambulatory Visit (INDEPENDENT_AMBULATORY_CARE_PROVIDER_SITE_OTHER): Payer: BLUE CROSS/BLUE SHIELD | Admitting: Internal Medicine

## 2016-03-26 VITALS — BP 132/72 | HR 84 | Temp 98.4°F | Resp 20 | Wt 128.0 lb

## 2016-03-26 DIAGNOSIS — I10 Essential (primary) hypertension: Secondary | ICD-10-CM

## 2016-03-26 DIAGNOSIS — K1121 Acute sialoadenitis: Secondary | ICD-10-CM | POA: Diagnosis not present

## 2016-03-26 DIAGNOSIS — E119 Type 2 diabetes mellitus without complications: Secondary | ICD-10-CM

## 2016-03-26 HISTORY — DX: Acute sialoadenitis: K11.21

## 2016-03-26 MED ORDER — AMOXICILLIN-POT CLAVULANATE 875-125 MG PO TABS: 1.0000 | ORAL_TABLET | Freq: Two times a day (BID) | ORAL | Status: DC

## 2016-03-26 NOTE — Progress Notes (Signed)
Subjective:    Patient ID: Ruth Crawford, female    DOB: [birth year], 60 y.o.   MRN: [id number]  HPI  Here with 3-4 days onsest painful tender swelling to left face/cheek wth swelling now preauricular and somewhat periorbital as well, assoc with feverish feeling, and rather firm, large LA swelling to left angle of jaw and submandibular.  Denies sinus pain or congestion.  + HA, malaise, general weakness, Has not had this prior.  Pt denies chest pain, increased sob or doe, wheezing, orthopnea, PND, increased LE swelling, palpitations, dizziness or syncope.   Pt denies polydipsia, polyuria, Past Medical History  Diagnosis Date  . DIABETES MELLITUS, TYPE II   . HYPERLIPIDEMIA   . ANXIETY   . DEPRESSION   . COMMON MIGRAINE   . HYPERTENSION   . POLYP, GALLBLADDER   . GERD (gastroesophageal reflux disease) 10/17/2012   Past Surgical History  Procedure Laterality Date  . Abdominal hysterectomy  2000  . Lumbar disc surgery  1998    s/p    reports that she has never smoked. She has never used smokeless tobacco. She reports that she does not drink alcohol or use illicit drugs. family history includes Alcohol abuse in her other; Arthritis in her other; Diabetes in her mother; Heart disease in her father; Stroke in her other. No Known Allergies Current Outpatient Prescriptions on File Prior to Visit  Medication Sig Dispense Refill  . ALREX 0.2 % SUSP Use 1 drop in both eyes twice daily    . diphenhydrAMINE (BENADRYL) 25 MG tablet Take 25 mg by mouth daily as needed.    Marland Kitchen FLUoxetine (PROZAC) 20 MG capsule Take 1 capsule (20 mg total) by mouth daily. 90 capsule 3  . glipiZIDE (GLIPIZIDE XL) 5 MG 24 hr tablet Take 1 tablet (5 mg total) by mouth 2 (two) times daily. 180 tablet 3  . glucose blood (ACCU-CHEK ACTIVE STRIPS) test strip Use as directed once daily to check blood sugar. 100 each 11  . Lancets (FREESTYLE) lancets Use as instructed 1 once daily 100 each 11  . lisinopril (PRINIVIL,ZESTRIL) 10 MG  tablet Take 1 tablet (10 mg total) by mouth daily. 90 tablet 3  . lovastatin (MEVACOR) 40 MG tablet Take 1 tablet (40 mg total) by mouth daily. 90 tablet 3  . metFORMIN (GLUCOPHAGE) 1000 MG tablet Take 1 tablet (1,000 mg total) by mouth 2 (two) times daily with a meal. 180 tablet 3  . phenylephrine (SUDAFED PE) 10 MG TABS tablet Take 10 mg by mouth daily.     No current facility-administered medications on file prior to visit.   Review of Systems  Constitutional: Negative for unusual diaphoresis or night sweats HENT: Negative for ear swelling or discharge Eyes: Negative for worsening visual haziness  Respiratory: Negative for choking and stridor.   Gastrointestinal: Negative for distension or worsening eructation Genitourinary: Negative for retention or change in urine volume.  Musculoskeletal: Negative for other MSK pain or swelling Skin: Negative for color change and worsening wound Neurological: Negative for tremors and numbness other than noted  Psychiatric/Behavioral: Negative for decreased concentration or agitation other than above       Objective:   Physical Exam BP 132/72 mmHg  Pulse 84  Temp(Src) 98.4 F (36.9 C) (Oral)  Resp 20  Wt 128 lb (58.06 kg)  SpO2 96% VS noted,  Constitutional: Pt appears in no apparent distress HENT: Head: NCAT.  Right Ear: External ear normal.  Left Ear: External ear normal.  Bilat  TM's clear, no erythema Eyes: . Pupils are equal, round, and reactive to light. Conjunctivae and EOM are normal 2+ tender swelling to left face/parotid area with some swelling preauricular and slight periorbital without sinus tender or oral lesions/sweling Neck: Normal range of motion. Neck supple. but with moderate enlarged/tender LA to post angle of jaw and submandibular Cardiovascular: Normal rate and regular rhythm.   Pulmonary/Chest: Effort normal and breath sounds without rales or wheezing.  Abd:  Soft, NT, ND, + BS - no HSM Neurological: Pt is alert. Not  confused , motor grossly intact Skin: Skin is warm. No rash, no LE edema Psychiatric: Pt behavior is normal. No agitation.     Assessment & Plan:

## 2016-03-26 NOTE — Patient Instructions (Signed)
Please take all new medication as prescribed  - the antibiotic  Please continue all other medications as before, and refills have been done if requested.  Please have the pharmacy call with any other refills you may need.  Please call or Mychart by Friday later this week if not improved, to see about ENT referral

## 2016-03-26 NOTE — Progress Notes (Signed)
Pre visit review using our clinic review tool, if applicable. No additional management support is needed unless otherwise documented below in the visit note. 

## 2016-03-27 MED ORDER — CLINDAMYCIN HCL 300 MG PO CAPS: 300.0000 mg | ORAL_CAPSULE | Freq: Three times a day (TID) | ORAL | Status: DC

## 2016-03-27 NOTE — Telephone Encounter (Signed)
Pt called in , see email.  meds are making her sick.  Anything else she can take?

## 2016-03-27 NOTE — Telephone Encounter (Signed)
Ok to change to cleocin - done erx

## 2016-03-29 ENCOUNTER — Telehealth: Payer: Self-pay | Admitting: Internal Medicine

## 2016-03-29 NOTE — Telephone Encounter (Signed)
Patient Name: Ruth Crawford  DOB: 05/20/1956    Initial Comment Caller says she is on med for sinus infection, and is having excruciating pains on her right temple.    Nurse Assessment  Nurse: Stefano GaulStringer, RN, Vera Date/Time (Eastern Time): 03/29/2016 1:51:08 PM  Confirm and document reason for call. If symptomatic, describe symptoms. You must click the next button to save text entered. ---Caller states she is taking antibiotics for sinus infection. Started with amoxicillin that made her sick. he changed the antibiotics yesterday and she started them yesterday. Has sharp pain on the left temple Excedrin did not help the headache. Did not sleep because of the headache.  Has the patient traveled out of the country within the last 30 days? ---Not Applicable  Does the patient have any new or worsening symptoms? ---Yes  Will a triage be completed? ---Yes  Related visit to physician within the last 2 weeks? ---Yes  Does the PT have any chronic conditions? (i.e. diabetes, asthma, etc.) ---Yes  List chronic conditions. ---migraines; diabetes  Is this a behavioral health or substance abuse call? ---No     Guidelines    Guideline Title Affirmed Question Affirmed Notes  Sinus Infection on Antibiotic Follow-up Call [1] SEVERE sinus pain AND [2] not improved 2 hours after pain medicine    Final Disposition User   See Physician within 4 Hours (or PCP triage) Stefano GaulStringer, RN, Vera    Comments  [facility]. Pt states if her headache does not get better, she will go to urgent care.   Referrals  GO TO FACILITY REFUSED   Disagree/Comply: Disagree  Disagree/Comply Reason: Disagree with instructions

## 2016-04-01 ENCOUNTER — Other Ambulatory Visit: Payer: Self-pay | Admitting: Internal Medicine

## 2016-04-02 NOTE — Assessment & Plan Note (Signed)
Mild to mod, for antibx course,  to f/u any worsening symptoms or concerns 

## 2016-04-02 NOTE — Telephone Encounter (Signed)
Done erx 

## 2016-04-02 NOTE — Assessment & Plan Note (Signed)
stable overall by history and exam, recent data reviewed with pt, and pt to continue medical treatment as before,  to f/u any worsening symptoms or concerns BP Readings from Last 3 Encounters:  03/26/16 132/72  02/28/16 122/84  02/01/16 120/76

## 2016-04-02 NOTE — Telephone Encounter (Signed)
Please advise 

## 2016-04-02 NOTE — Assessment & Plan Note (Signed)
stable overall by history and exam, recent data reviewed with pt, and pt to continue medical treatment as before,  to f/u any worsening symptoms or concerns Lab Results  Component Value Date   HGBA1C 6.9* 02/28/2016

## 2016-04-08 ENCOUNTER — Encounter: Payer: Self-pay | Admitting: Internal Medicine

## 2016-04-22 ENCOUNTER — Encounter: Payer: Self-pay | Admitting: Internal Medicine

## 2016-05-08 ENCOUNTER — Encounter: Payer: Self-pay | Admitting: Internal Medicine

## 2016-05-13 DIAGNOSIS — Z48811 Encounter for surgical aftercare following surgery on the nervous system: Secondary | ICD-10-CM | POA: Diagnosis not present

## 2016-05-20 ENCOUNTER — Other Ambulatory Visit: Payer: Self-pay | Admitting: Internal Medicine

## 2016-06-04 DIAGNOSIS — Z6824 Body mass index (BMI) 24.0-24.9, adult: Secondary | ICD-10-CM | POA: Diagnosis not present

## 2016-06-04 DIAGNOSIS — Z01419 Encounter for gynecological examination (general) (routine) without abnormal findings: Secondary | ICD-10-CM | POA: Diagnosis not present

## 2016-06-27 ENCOUNTER — Encounter: Payer: BLUE CROSS/BLUE SHIELD | Admitting: Internal Medicine

## 2016-07-08 ENCOUNTER — Encounter: Payer: BLUE CROSS/BLUE SHIELD | Admitting: Internal Medicine

## 2016-07-23 ENCOUNTER — Encounter: Payer: Self-pay | Admitting: Internal Medicine

## 2016-07-26 ENCOUNTER — Ambulatory Visit (INDEPENDENT_AMBULATORY_CARE_PROVIDER_SITE_OTHER): Payer: BLUE CROSS/BLUE SHIELD

## 2016-07-26 DIAGNOSIS — Z23 Encounter for immunization: Secondary | ICD-10-CM

## 2016-07-31 DIAGNOSIS — N911 Secondary amenorrhea: Secondary | ICD-10-CM | POA: Diagnosis not present

## 2016-07-31 DIAGNOSIS — Z13820 Encounter for screening for osteoporosis: Secondary | ICD-10-CM | POA: Diagnosis not present

## 2016-08-26 DIAGNOSIS — M8580 Other specified disorders of bone density and structure, unspecified site: Secondary | ICD-10-CM | POA: Diagnosis not present

## 2016-08-30 ENCOUNTER — Ambulatory Visit (INDEPENDENT_AMBULATORY_CARE_PROVIDER_SITE_OTHER): Payer: BLUE CROSS/BLUE SHIELD | Admitting: Internal Medicine

## 2016-08-30 ENCOUNTER — Other Ambulatory Visit (INDEPENDENT_AMBULATORY_CARE_PROVIDER_SITE_OTHER): Payer: BLUE CROSS/BLUE SHIELD

## 2016-08-30 ENCOUNTER — Encounter: Payer: Self-pay | Admitting: Internal Medicine

## 2016-08-30 VITALS — BP 130/72 | HR 92 | Temp 98.9°F | Resp 20 | Wt 131.0 lb

## 2016-08-30 DIAGNOSIS — D649 Anemia, unspecified: Secondary | ICD-10-CM

## 2016-08-30 DIAGNOSIS — Z1159 Encounter for screening for other viral diseases: Secondary | ICD-10-CM

## 2016-08-30 DIAGNOSIS — I10 Essential (primary) hypertension: Secondary | ICD-10-CM | POA: Diagnosis not present

## 2016-08-30 DIAGNOSIS — E785 Hyperlipidemia, unspecified: Secondary | ICD-10-CM | POA: Diagnosis not present

## 2016-08-30 DIAGNOSIS — E119 Type 2 diabetes mellitus without complications: Secondary | ICD-10-CM | POA: Diagnosis not present

## 2016-08-30 DIAGNOSIS — K1121 Acute sialoadenitis: Secondary | ICD-10-CM

## 2016-08-30 DIAGNOSIS — Z0001 Encounter for general adult medical examination with abnormal findings: Secondary | ICD-10-CM

## 2016-08-30 HISTORY — DX: Anemia, unspecified: D64.9

## 2016-08-30 LAB — CBC WITH DIFFERENTIAL/PLATELET
BASOS ABS: 0.1 10*3/uL (ref 0.0–0.1)
Basophils Relative: 1.2 % (ref 0.0–3.0)
Eosinophils Absolute: 0.3 10*3/uL (ref 0.0–0.7)
Eosinophils Relative: 4 % (ref 0.0–5.0)
HCT: 35.9 % — ABNORMAL LOW (ref 36.0–46.0)
Hemoglobin: 11.7 g/dL — ABNORMAL LOW (ref 12.0–15.0)
LYMPHS ABS: 1.8 10*3/uL (ref 0.7–4.0)
Lymphocytes Relative: 21.2 % (ref 12.0–46.0)
MCHC: 32.5 g/dL (ref 30.0–36.0)
MCV: 78.4 fl (ref 78.0–100.0)
MONO ABS: 0.4 10*3/uL (ref 0.1–1.0)
Monocytes Relative: 4.3 % (ref 3.0–12.0)
NEUTROS PCT: 69.3 % (ref 43.0–77.0)
Neutro Abs: 5.9 10*3/uL (ref 1.4–7.7)
Platelets: 429 10*3/uL — ABNORMAL HIGH (ref 150.0–400.0)
RBC: 4.58 Mil/uL (ref 3.87–5.11)
RDW: 16 % — ABNORMAL HIGH (ref 11.5–15.5)
WBC: 8.4 10*3/uL (ref 4.0–10.5)

## 2016-08-30 LAB — LIPID PANEL
CHOLESTEROL: 119 mg/dL (ref 0–200)
HDL: 61.8 mg/dL (ref >39.00)
LDL Cholesterol: 41 mg/dL (ref 0–99)
NonHDL: 56.99
Total CHOL/HDL Ratio: 2
Triglycerides: 82 mg/dL (ref 0.0–149.0)
VLDL: 16.4 mg/dL (ref 0.0–40.0)

## 2016-08-30 LAB — HEPATIC FUNCTION PANEL
ALBUMIN: 4.3 g/dL (ref 3.5–5.2)
ALK PHOS: 66 U/L (ref 39–117)
ALT: 15 U/L (ref 0–35)
AST: 15 U/L (ref 0–37)
Bilirubin, Direct: 0.2 mg/dL (ref 0.0–0.3)
TOTAL PROTEIN: 7.1 g/dL (ref 6.0–8.3)
Total Bilirubin: 0.4 mg/dL (ref 0.2–1.2)

## 2016-08-30 LAB — IBC PANEL
Iron: 54 ug/dL (ref 42–145)
SATURATION RATIOS: 12.1 % — AB (ref 20.0–50.0)
Transferrin: 320 mg/dL (ref 212.0–360.0)

## 2016-08-30 LAB — BASIC METABOLIC PANEL
BUN: 12 mg/dL (ref 6–23)
CO2: 28 meq/L (ref 19–32)
Calcium: 9.4 mg/dL (ref 8.4–10.5)
Chloride: 103 mEq/L (ref 96–112)
Creatinine, Ser: 0.71 mg/dL (ref 0.40–1.20)
GFR: 107.8 mL/min (ref >60.00)
GLUCOSE: 300 mg/dL — AB (ref 70–99)
POTASSIUM: 3.9 meq/L (ref 3.5–5.1)
SODIUM: 140 meq/L (ref 135–145)

## 2016-08-30 LAB — HEPATITIS C ANTIBODY: HCV Ab: NEGATIVE

## 2016-08-30 LAB — HEMOGLOBIN A1C: HEMOGLOBIN A1C: 7.2 % — AB (ref 4.6–6.5)

## 2016-08-30 MED ORDER — GLUCOSE BLOOD VI STRP: ORAL_STRIP | 11 refills | Status: DC

## 2016-08-30 NOTE — Progress Notes (Signed)
Pre visit review using our clinic review tool, if applicable. No additional management support is needed unless otherwise documented below in the visit note. 

## 2016-08-30 NOTE — Patient Instructions (Signed)
Please continue all other medications as before, and refills have been done if requested.  Please have the pharmacy call with any other refills you may need.  Please continue your efforts at being more active, low cholesterol diet, and weight control.  You are otherwise up to date with prevention measures today.  Please keep your appointments with your specialists as you may have planned  Please go to the LAB in the Basement (turn left off the elevator) for the tests to be done today  You will be contacted by phone if any changes need to be made immediately.  Otherwise, you will receive a letter about your results with an explanation, but please check with MyChart first.  Please return in 6 months, or sooner if needed, with Lab testing done 3-5 days before  

## 2016-08-30 NOTE — Progress Notes (Signed)
Subjective:    Patient ID: Ruth Crawford, female    DOB: 04/07/1956, 60 y.o.   MRN: [id number]  HPI  Here to f/u; overall doing ok,  Pt denies chest pain, increasing sob or doe, wheezing, orthopnea, PND, increased LE swelling, palpitations, dizziness or syncope.  Pt denies new neurological symptoms such as new headache, or facial or extremity weakness or numbness.  Pt denies polydipsia, polyuria, or low sugar episode.   Pt denies new neurological symptoms such as new headache, or facial or extremity weakness or numbness.   Pt states overall good compliance with meds, mostly trying to follow appropriate diet, with wt overall stable,  but little exercise however. Peak wt recently has been 140.   Wt Readings from Last 3 Encounters:  08/30/16 131 lb (59.4 kg)  03/26/16 128 lb (58.1 kg)  02/28/16 128 lb (58.1 kg)  Recent parotitis resolved from July 2017.  Did also have URI about 1 mo ago, now resolved.  Has fu colnoscopy scheduled jan 2018.   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  No recent overt bleeding Past Medical History:  Diagnosis Date  . ANXIETY   . COMMON MIGRAINE   . DEPRESSION   . DIABETES MELLITUS, TYPE II   . GERD (gastroesophageal reflux disease) 10/17/2012  . HYPERLIPIDEMIA   . HYPERTENSION   . POLYP, GALLBLADDER    Past Surgical History:  Procedure Laterality Date  . ABDOMINAL HYSTERECTOMY  2000  . LUMBAR DISC SURGERY  1998   s/p    reports that she has never smoked. She has never used smokeless tobacco. She reports that she does not drink alcohol or use drugs. family history includes Alcohol abuse in her other; Arthritis in her other; Diabetes in her mother; Heart disease in her father; Stroke in her other. No Known Allergies Current Outpatient Prescriptions on File Prior to Visit  Medication Sig Dispense Refill  . ALREX 0.2 % SUSP Use 1 drop in both eyes twice daily    . diphenhydrAMINE (BENADRYL) 25 MG tablet Take 25 mg by mouth daily as needed.    Marland Kitchen. FLUoxetine (PROZAC) 20 MG  capsule Take 1 capsule (20 mg total) by mouth daily. 90 capsule 3  . FLUoxetine (PROZAC) 20 MG capsule TAKE ONE CAPSULE BY MOUTH ONCE DAILY 90 capsule 3  . glipiZIDE (GLIPIZIDE XL) 5 MG 24 hr tablet Take 1 tablet (5 mg total) by mouth 2 (two) times daily. 180 tablet 3  . Lancets (FREESTYLE) lancets Use as instructed 1 once daily 100 each 11  . lisinopril (PRINIVIL,ZESTRIL) 10 MG tablet Take 1 tablet (10 mg total) by mouth daily. 90 tablet 3  . lovastatin (MEVACOR) 40 MG tablet Take 1 tablet (40 mg total) by mouth daily. 90 tablet 3  . metFORMIN (GLUCOPHAGE) 1000 MG tablet TAKE ONE TABLET BY MOUTH TWICE DAILY WITH A MEAL. 180 tablet 3  . phenylephrine (SUDAFED PE) 10 MG TABS tablet Take 10 mg by mouth daily.     No current facility-administered medications on file prior to visit.    Review of Systems  Constitutional: Negative for unusual diaphoresis or night sweats HENT: Negative for ear swelling or discharge Eyes: Negative for worsening visual haziness  Respiratory: Negative for choking and stridor.   Gastrointestinal: Negative for distension or worsening eructation Genitourinary: Negative for retention or change in urine volume.  Musculoskeletal: Negative for other MSK pain or swelling Skin: Negative for color change and worsening wound Neurological: Negative for tremors and numbness other than noted  Psychiatric/Behavioral: Negative for decreased concentration or agitation other than above   All other system neg per pt    Objective:   Physical Exam BP 130/72   Pulse 92   Temp 98.9 F (37.2 C) (Oral)   Resp 20   Wt 131 lb (59.4 kg)   SpO2 97%   BMI 23.96 kg/m  VS noted,  Constitutional: Pt appears in no apparent distress HENT: Head: NCAT.  Right Ear: External ear normal.  Left Ear: External ear normal.  Eyes: . Pupils are equal, round, and reactive to light. Conjunctivae and EOM are normal Neck: Normal range of motion. Neck supple.  Cardiovascular: Normal rate and regular  rhythm.   Pulmonary/Chest: Effort normal and breath sounds without rales or wheezing.  Abd:  Soft, NT, ND, + BS Neurological: Pt is alert. Not confused , motor grossly intact Skin: Skin is warm. No rash, no LE edema Psychiatric: Pt behavior is normal. No agitation.  No other new exam findings    Assessment & Plan:

## 2016-08-31 NOTE — Assessment & Plan Note (Signed)
Resolved,  to f/u any worsening symptoms or concerns  

## 2016-08-31 NOTE — Assessment & Plan Note (Signed)
stable overall by history and exam, recent data reviewed with pt, and pt to continue medical treatment as before,  to f/u any worsening symptoms or concerns Lab Results  Component Value Date   HGBA1C 7.2 (H) 08/30/2016

## 2016-08-31 NOTE — Assessment & Plan Note (Signed)
stable overall by history and exam, recent data reviewed with pt, and pt to continue medical treatment as before,  to f/u any worsening symptoms or concerns Lab Results  Component Value Date   LDLCALC 41 08/30/2016

## 2016-08-31 NOTE — Assessment & Plan Note (Signed)
stable overall by history and exam, recent data reviewed with pt, and pt to continue medical treatment as before,  to f/u any worsening symptoms or concerns BP Readings from Last 3 Encounters:  08/30/16 130/72  03/26/16 132/72  02/28/16 122/84

## 2016-08-31 NOTE — Assessment & Plan Note (Signed)
Mild, no overt symptoms, for cbc, iron lab f/u,  to f/u any worsening symptoms or concerns

## 2016-09-06 ENCOUNTER — Telehealth: Payer: Self-pay | Admitting: *Deleted

## 2016-09-06 MED ORDER — ONETOUCH ULTRA 2 W/DEVICE KIT: PACK | 0 refills | Status: DC

## 2016-09-06 MED ORDER — ONETOUCH ULTRASOFT LANCETS MISC: 1.0000 | Freq: Two times a day (BID) | 5 refills | Status: DC

## 2016-09-06 MED ORDER — GLUCOSE BLOOD VI STRP: 1.0000 | ORAL_STRIP | Freq: Two times a day (BID) | 5 refills | Status: DC

## 2016-09-06 NOTE — Telephone Encounter (Signed)
Left  msg on triage needing BS monitor sen to pharmacy. Sending rx for one touch ultra. Pt has [employer]...Raechel Chute/lmb

## 2016-09-11 ENCOUNTER — Encounter: Payer: Self-pay | Admitting: Internal Medicine

## 2016-09-18 ENCOUNTER — Ambulatory Visit (AMBULATORY_SURGERY_CENTER): Payer: Self-pay | Admitting: *Deleted

## 2016-09-18 VITALS — Ht 62.0 in | Wt 130.4 lb

## 2016-09-18 DIAGNOSIS — Z1211 Encounter for screening for malignant neoplasm of colon: Secondary | ICD-10-CM

## 2016-09-18 MED ORDER — NA SULFATE-K SULFATE-MG SULF 17.5-3.13-1.6 GM/177ML PO SOLN: 1.0000 | Freq: Once | ORAL | 0 refills | Status: AC

## 2016-09-18 NOTE — Progress Notes (Signed)
No allergies to eggs or soy. No problems with anesthesia.  Pt given Emmi instructions for colonoscopy  No oxygen use  No diet drug use  

## 2016-09-30 ENCOUNTER — Ambulatory Visit (AMBULATORY_SURGERY_CENTER): Payer: BLUE CROSS/BLUE SHIELD | Admitting: Internal Medicine

## 2016-09-30 ENCOUNTER — Encounter: Payer: Self-pay | Admitting: Internal Medicine

## 2016-09-30 VITALS — BP 136/77 | HR 68 | Temp 97.1°F | Resp 14 | Ht 62.0 in | Wt 130.0 lb

## 2016-09-30 DIAGNOSIS — Z1211 Encounter for screening for malignant neoplasm of colon: Secondary | ICD-10-CM | POA: Diagnosis not present

## 2016-09-30 DIAGNOSIS — Z1212 Encounter for screening for malignant neoplasm of rectum: Secondary | ICD-10-CM

## 2016-09-30 MED ORDER — SODIUM CHLORIDE 0.9 % IV SOLN: 500.0000 mL | INTRAVENOUS | Status: DC

## 2016-09-30 NOTE — Patient Instructions (Signed)
Impression/Recommendations:  Diverticulosis handout given to patient. Hemorrhoid handout given to patient.  Repeat colonoscopy in 10 years for screening purposes.  YOU HAD AN ENDOSCOPIC PROCEDURE TODAY AT THE [facility]:   Refer to the procedure report that was given to you for any specific questions about what was found during the examination.  If the procedure report does not answer your questions, please call your gastroenterologist to clarify.  If you requested that your care partner not be given the details of your procedure findings, then the procedure report has been included in a sealed envelope for you to review at your convenience later.  YOU SHOULD EXPECT: Some feelings of bloating in the abdomen. Passage of more gas than usual.  Walking can help get rid of the air that was put into your GI tract during the procedure and reduce the bloating. If you had a lower endoscopy (such as a colonoscopy or flexible sigmoidoscopy) you may notice spotting of blood in your stool or on the toilet paper. If you underwent a bowel prep for your procedure, you may not have a normal bowel movement for a few days.  Please Note:  You might notice some irritation and congestion in your nose or some drainage.  This is from the oxygen used during your procedure.  There is no need for concern and it should clear up in a day or so.  SYMPTOMS TO REPORT IMMEDIATELY:   Following lower endoscopy (colonoscopy or flexible sigmoidoscopy):  Excessive amounts of blood in the stool  Significant tenderness or worsening of abdominal pains  Swelling of the abdomen that is new, acute  Fever of 100F or higher For urgent or emergent issues, a gastroenterologist can be reached at any hour by calling [phone].   DIET:  We do recommend a small meal at first, but then you may proceed to your regular diet.  Drink plenty of fluids but you should avoid alcoholic beverages for 24 hours.  ACTIVITY:  You should  plan to take it easy for the rest of today and you should NOT DRIVE or use heavy machinery until tomorrow (because of the sedation medicines used during the test).    FOLLOW UP: Our staff will call the number listed on your records the next business day following your procedure to check on you and address any questions or concerns that you may have regarding the information given to you following your procedure. If we do not reach you, we will leave a message.  However, if you are feeling well and you are not experiencing any problems, there is no need to return our call.  We will assume that you have returned to your regular daily activities without incident.  If any biopsies were taken you will be contacted by phone or by letter within the next 1-3 weeks.  Please call us at [phone] if you have not heard about the biopsies in 3 weeks.    SIGNATURES/CONFIDENTIALITY: You and/or your care partner have signed paperwork which will be entered into your electronic medical record.  These signatures attest to the fact that that the information above on your After Visit Summary has been reviewed and is understood.  Full responsibility of the confidentiality of this discharge information lies with you and/or your care-partner. 

## 2016-09-30 NOTE — Op Note (Signed)
[facility] Patient Name: Ruth Crawford Procedure Date: 09/30/2016 8:46 AM MRN: [id number] Endoscopist: Wilhemina Bonito. Marina Goodell , MD Age: 61 Referring MD:  Date of Birth: 12/24/1955 Gender: Female Account #: [id number] Procedure:                Colonoscopy Indications:              Screening for colorectal malignant neoplasm. Index                            exam 2007 was negative Medicines:                Monitored Anesthesia Care Procedure:                Pre-Anesthesia Assessment:                           - Prior to the procedure, a History and Physical                            was performed, and patient medications and                            allergies were reviewed. The patient's tolerance of                            previous anesthesia was also reviewed. The risks                            and benefits of the procedure and the sedation                            options and risks were discussed with the patient.                            All questions were answered, and informed consent                            was obtained. Prior Anticoagulants: The patient has                            taken no previous anticoagulant or antiplatelet                            agents. ASA Grade Assessment: II - A patient with                            mild systemic disease. After reviewing the risks                            and benefits, the patient was deemed in                            satisfactory condition to undergo the procedure.  After obtaining informed consent, the colonoscope                            was passed under direct vision. Throughout the                            procedure, the patient's blood pressure, pulse, and                            oxygen saturations were monitored continuously. The                            Model PCF-[id number]) scope was introduced                            through the anus and advanced to the  the cecum,                            identified by appendiceal orifice and ileocecal                            valve. The ileocecal valve, appendiceal orifice,                            and rectum were photographed. The quality of the                            bowel preparation was excellent. The colonoscopy                            was performed without difficulty. The patient                            tolerated the procedure well. The bowel preparation                            used was SUPREP. Scope In: 9:04:16 AM Scope Out: 9:19:47 AM Scope Withdrawal Time: 0 hours 11 minutes 22 seconds  Total Procedure Duration: 0 hours 15 minutes 31 seconds  Findings:                 A few medium-mouthed diverticula were found in the                            ascending colon.                           Internal hemorrhoids were found during                            retroflexion. The hemorrhoids were small.                           The exam was otherwise without abnormality on  direct and retroflexion views. Complications:            No immediate complications. Estimated blood loss:                            None. Estimated Blood Loss:     Estimated blood loss: none. Impression:               - Diverticulosis in the ascending colon.                           - Internal hemorrhoids.                           - The examination was otherwise normal on direct                            and retroflexion views.                           - No specimens collected. Recommendation:           - Repeat colonoscopy in 10 years for screening                            purposes.                           - Patient has a contact number available for                            emergencies. The signs and symptoms of potential                            delayed complications were discussed with the                            patient. Return to normal activities tomorrow.                             Written discharge instructions were provided to the                            patient.                           - Resume previous diet.                           - Continue present medications. Wilhemina BonitoJohn N. Marina GoodellPerry, MD 09/30/2016 9:25:00 AM This report has been signed electronically.

## 2016-09-30 NOTE — Progress Notes (Signed)
To recovery, report to RN, VSS. 

## 2016-10-01 ENCOUNTER — Telehealth: Payer: Self-pay

## 2016-10-01 NOTE — Telephone Encounter (Signed)
  Follow up Call-  Call back number 09/30/2016  Post procedure Call Back phone  # [phone]  Permission to leave phone message Yes  Some recent data might be hidden     Patient questions:  Do you have a fever, pain , or abdominal swelling? No. Pain Score  0 *  Have you tolerated food without any problems? Yes.    Have you been able to return to your normal activities? Yes.    Do you have any questions about your discharge instructions: Diet   No. Medications  No. Follow up visit  No.  Do you have questions or concerns about your Care? No.  Actions: * If pain score is 4 or above: No action needed, pain <4.

## 2016-10-15 ENCOUNTER — Other Ambulatory Visit: Payer: Self-pay | Admitting: Internal Medicine

## 2016-12-17 ENCOUNTER — Other Ambulatory Visit: Payer: Self-pay | Admitting: Internal Medicine

## 2016-12-24 ENCOUNTER — Other Ambulatory Visit: Payer: Self-pay | Admitting: Internal Medicine

## 2017-02-05 ENCOUNTER — Encounter: Payer: Self-pay | Admitting: Internal Medicine

## 2017-02-05 ENCOUNTER — Ambulatory Visit (INDEPENDENT_AMBULATORY_CARE_PROVIDER_SITE_OTHER): Payer: BLUE CROSS/BLUE SHIELD | Admitting: Internal Medicine

## 2017-02-05 VITALS — BP 150/100 | HR 80 | Ht 62.0 in | Wt 134.0 lb

## 2017-02-05 DIAGNOSIS — M7989 Other specified soft tissue disorders: Secondary | ICD-10-CM

## 2017-02-05 DIAGNOSIS — M26622 Arthralgia of left temporomandibular joint: Secondary | ICD-10-CM | POA: Diagnosis not present

## 2017-02-05 DIAGNOSIS — M79661 Pain in right lower leg: Secondary | ICD-10-CM | POA: Insufficient documentation

## 2017-02-05 DIAGNOSIS — I10 Essential (primary) hypertension: Secondary | ICD-10-CM

## 2017-02-05 DIAGNOSIS — E119 Type 2 diabetes mellitus without complications: Secondary | ICD-10-CM

## 2017-02-05 MED ORDER — CEPHALEXIN 500 MG PO CAPS: 500.0000 mg | ORAL_CAPSULE | Freq: Four times a day (QID) | ORAL | 0 refills | Status: DC

## 2017-02-05 MED ORDER — MELOXICAM 15 MG PO TABS: 15.0000 mg | ORAL_TABLET | Freq: Every day | ORAL | 2 refills | Status: DC | PRN

## 2017-02-05 MED ORDER — RIVAROXABAN (XARELTO) VTE STARTER PACK (15 & 20 MG): ORAL_TABLET | ORAL | 0 refills | Status: DC

## 2017-02-05 NOTE — Assessment & Plan Note (Signed)
stable overall by history and exam, recent data reviewed with pt, and pt to continue medical treatment as before,  to f/u any worsening symptoms or concerns Lab Results  Component Value Date   HGBA1C 7.2 (H) 08/30/2016    

## 2017-02-05 NOTE — Progress Notes (Signed)
Subjective:    Patient ID: Ruth Crawford, female    DOB: Feb 11, 1956, 61 y.o.   MRN: [id number]  HPI  Here with 3 wks onset left preauricular pain without ear pain, swelling, rash, sinus congestion, HA, fever, ST, cough or sob.  Pain is mild, intermittent, without radiation, worse to talk and chew. No prior hx, recent trauma. Has already seen dental - no teeth issue. Does have several wks ongoing nasal allergy symptoms with clearish congestion, itch and sneezing, without fever, pain, ST, cough, swelling or wheezing. Pt denies chest pain, increased sob or doe, wheezing, orthopnea, PND, increased LE swelling, palpitations, dizziness or syncope. BP at home < 140/90.  Pt denies polydipsia, polyuria.     Past Medical History:  Diagnosis Date  . Allergy   . ANXIETY   . COMMON MIGRAINE   . DEPRESSION   . DIABETES MELLITUS, TYPE II   . GERD (gastroesophageal reflux disease) 10/17/2012  . HYPERLIPIDEMIA   . HYPERTENSION   . POLYP, GALLBLADDER   . Sickle cell trait [facility])    Past Surgical History:  Procedure Laterality Date  . ABDOMINAL HYSTERECTOMY  2000  . COLONOSCOPY    . LUMBAR DISC SURGERY  1998, 2016, 2017   s/p    reports that she has never smoked. She has never used smokeless tobacco. She reports that she drinks alcohol. She reports that she does not use drugs. family history includes Alcohol abuse in her other; Arthritis in her other; Diabetes in her mother; Heart disease in her father; Stroke in her other. Allergies  Allergen Reactions  . Demerol [Meperidine] Other (See Comments)    Per pt: unknown  . Prednisone Itching and Rash   Current Outpatient Prescriptions on File Prior to Visit  Medication Sig Dispense Refill  . Aspirin-Acetaminophen-Caffeine (EXCEDRIN MIGRAINE PO) Take by mouth as needed.    Marland Kitchen azelastine (OPTIVAR) 0.05 % ophthalmic solution INSTILL ONE DROP INTO EACH EYE TWICE DAILY 6 mL 12  . diphenhydrAMINE (BENADRYL) 25 MG tablet Take 25 mg by mouth daily as needed.      Marland Kitchen FLUoxetine (PROZAC) 20 MG capsule TAKE ONE CAPSULE BY MOUTH ONCE DAILY 90 capsule 3  . glipiZIDE (GLUCOTROL XL) 5 MG 24 hr tablet TAKE ONE TABLET BY MOUTH TWICE DAILY 180 tablet 3  . lisinopril (PRINIVIL,ZESTRIL) 10 MG tablet Take 1 tablet (10 mg total) by mouth daily. 90 tablet 3  . lovastatin (MEVACOR) 40 MG tablet Take 1 tablet (40 mg total) by mouth daily. 90 tablet 3  . metFORMIN (GLUCOPHAGE) 1000 MG tablet TAKE ONE TABLET BY MOUTH TWICE DAILY WITH A MEAL. 180 tablet 3  . phenylephrine (SUDAFED PE) 10 MG TABS tablet Take 10 mg by mouth daily.     No current facility-administered medications on file prior to visit.    Review of Systems  Constitutional: Negative for other unusual diaphoresis or sweats HENT: Negative for ear discharge or swelling Eyes: Negative for other worsening visual disturbances Respiratory: Negative for stridor or other swelling  Gastrointestinal: Negative for worsening distension or other blood Genitourinary: Negative for retention or other urinary change Musculoskeletal: Negative for other MSK pain or swelling Skin: Negative for color change or other new lesions Neurological: Negative for worsening tremors and other numbness  Psychiatric/Behavioral: Negative for worsening agitation or other fatigue All other system neg per pt    Objective:   Physical Exam BP (!) 150/100   Pulse 80   Ht 5\' 2"  (1.575 m)   Wt 134 lb (  60.8 kg)   SpO2 99%   BMI 24.51 kg/m  VS noted,  Constitutional: Pt appears in NAD HENT: Head: NCAT.  Right Ear: External ear normal.  Left Ear: External ear normal.  Eyes: . Pupils are equal, round, and reactive to light. Conjunctivae and EOM are normal Left TMJ with mild tender and click on jaw opening No temporal artery tender Nose: without d/c or deformity Neck: Neck supple. Gross normal ROM Cardiovascular: Normal rate and regular rhythm.   Pulmonary/Chest: Effort normal and breath sounds without rales or wheezing.  Neurological:  Pt is alert. At baseline orientation, motor grossly intact Skin: Skin is warm. No rashes, other new lesions, no LE edema Psychiatric: Pt behavior is normal without agitation  No other exam findings    Assessment & Plan:

## 2017-02-05 NOTE — Patient Instructions (Addendum)
Please take all new medication as prescribed - the antiinflammatory  OK for softer diet for 1 week  Please call if not improved for oral surgeon referral  Please continue all other medications as before, and refills have been done if requested.  Please have the pharmacy call with any other refills you may need.  Please keep your appointments with your specialists as you may have planned

## 2017-02-05 NOTE — Assessment & Plan Note (Signed)
Mild to mod, for nsaid prn,  to f/u any worsening symptoms or concerns 

## 2017-02-05 NOTE — Assessment & Plan Note (Signed)
Mild uncontrolled, likely reactive, to cont current tx, f/u BP at home and next visit

## 2017-03-04 ENCOUNTER — Ambulatory Visit: Payer: BLUE CROSS/BLUE SHIELD | Admitting: Internal Medicine

## 2017-03-19 ENCOUNTER — Other Ambulatory Visit: Payer: Self-pay | Admitting: Internal Medicine

## 2017-03-23 ENCOUNTER — Other Ambulatory Visit: Payer: Self-pay | Admitting: Internal Medicine

## 2017-03-26 ENCOUNTER — Ambulatory Visit (INDEPENDENT_AMBULATORY_CARE_PROVIDER_SITE_OTHER): Payer: BLUE CROSS/BLUE SHIELD | Admitting: Internal Medicine

## 2017-03-26 VITALS — BP 126/84 | HR 92 | Ht 62.0 in | Wt 126.0 lb

## 2017-03-26 DIAGNOSIS — E119 Type 2 diabetes mellitus without complications: Secondary | ICD-10-CM | POA: Diagnosis not present

## 2017-03-26 DIAGNOSIS — R519 Headache, unspecified: Secondary | ICD-10-CM | POA: Insufficient documentation

## 2017-03-26 DIAGNOSIS — R51 Headache: Secondary | ICD-10-CM

## 2017-03-26 DIAGNOSIS — M26622 Arthralgia of left temporomandibular joint: Secondary | ICD-10-CM | POA: Diagnosis not present

## 2017-03-26 DIAGNOSIS — Z0000 Encounter for general adult medical examination without abnormal findings: Secondary | ICD-10-CM

## 2017-03-26 DIAGNOSIS — E785 Hyperlipidemia, unspecified: Secondary | ICD-10-CM

## 2017-03-26 DIAGNOSIS — I10 Essential (primary) hypertension: Secondary | ICD-10-CM

## 2017-03-26 DIAGNOSIS — Z0001 Encounter for general adult medical examination with abnormal findings: Secondary | ICD-10-CM | POA: Diagnosis not present

## 2017-03-26 DIAGNOSIS — Z114 Encounter for screening for human immunodeficiency virus [HIV]: Secondary | ICD-10-CM

## 2017-03-26 HISTORY — DX: Headache, unspecified: R51.9

## 2017-03-26 NOTE — Progress Notes (Signed)
Subjective:    Patient ID: Ruth Crawford, female    DOB: 1956-04-18, 61 y.o.   MRN: [id number]  HPI  Here for wellness and f/u;  Overall doing ok;  Pt denies Chest pain, worsening SOB, DOE, wheezing, orthopnea, PND, worsening LE edema, palpitations, dizziness or syncope.  Pt denies neurological change such as new headache, facial or extremity weakness.  Pt denies polydipsia, polyuria, or low sugar symptoms. Pt states overall good compliance with treatment and medications, good tolerability, and has been trying to follow appropriate diet.  Pt denies worsening depressive symptoms, suicidal ideation or panic. No fever, night sweats, wt loss, loss of appetite, or other constitutional symptoms.  Pt states good ability with ADL's, has low fall risk, home safety reviewed and adequate, no other significant changes in hearing or vision, and only occasionally active with exercise. Still having significant left TMJ pain despite nsaid, moderate, intermittent, worse to talk and chew.  Asks for ENT referral for second opinion Past Medical History:  Diagnosis Date  . Allergy   . ANXIETY   . COMMON MIGRAINE   . DEPRESSION   . DIABETES MELLITUS, TYPE II   . GERD (gastroesophageal reflux disease) 10/17/2012  . HYPERLIPIDEMIA   . HYPERTENSION   . POLYP, GALLBLADDER   . Sickle cell trait [facility])    Past Surgical History:  Procedure Laterality Date  . ABDOMINAL HYSTERECTOMY  2000  . COLONOSCOPY    . LUMBAR DISC SURGERY  1998, 2016, 2017   s/p    reports that she has never smoked. She has never used smokeless tobacco. She reports that she drinks alcohol. She reports that she does not use drugs. family history includes Alcohol abuse in her other; Arthritis in her other; Diabetes in her mother; Heart disease in her father; Stroke in her other. Allergies  Allergen Reactions  . Demerol [Meperidine] Other (See Comments)    Per pt: unknown  . Prednisone Itching and Rash   Current Outpatient Prescriptions on File  Prior to Visit  Medication Sig Dispense Refill  . Aspirin-Acetaminophen-Caffeine (EXCEDRIN MIGRAINE PO) Take by mouth as needed.    Marland Kitchen azelastine (OPTIVAR) 0.05 % ophthalmic solution INSTILL ONE DROP INTO EACH EYE TWICE DAILY 6 mL 12  . diphenhydrAMINE (BENADRYL) 25 MG tablet Take 25 mg by mouth daily as needed.    Marland Kitchen FLUoxetine (PROZAC) 20 MG capsule TAKE ONE CAPSULE BY MOUTH ONCE DAILY 90 capsule 3  . glipiZIDE (GLUCOTROL XL) 5 MG 24 hr tablet TAKE ONE TABLET BY MOUTH TWICE DAILY 180 tablet 3  . glucose blood test strip 1 each by Other route as needed for other. RELION PRIME TEST STRIPS  Use as instructed    . lisinopril (PRINIVIL,ZESTRIL) 10 MG tablet TAKE ONE TABLET BY MOUTH ONCE DAILY 90 tablet 1  . lovastatin (MEVACOR) 40 MG tablet Take 1 tablet (40 mg total) by mouth daily. 90 tablet 3  . meloxicam (MOBIC) 15 MG tablet Take 1 tablet (15 mg total) by mouth daily as needed for pain. 30 tablet 2  . metFORMIN (GLUCOPHAGE) 1000 MG tablet TAKE ONE TABLET BY MOUTH TWICE DAILY WITH MEALS 180 tablet 1  . phenylephrine (SUDAFED PE) 10 MG TABS tablet Take 10 mg by mouth daily.    Marland Kitchen RELION LANCETS STANDARD 21G MISC by Does not apply route.     No current facility-administered medications on file prior to visit.    Review of Systems Constitutional: Negative for other unusual diaphoresis, sweats, appetite or weight changes HENT:  Negative for other worsening hearing loss, ear pain, facial swelling, mouth sores or neck stiffness.   Eyes: Negative for other worsening pain, redness or other visual disturbance.  Respiratory: Negative for other stridor or swelling Cardiovascular: Negative for other palpitations or other chest pain  Gastrointestinal: Negative for worsening diarrhea or loose stools, blood in stool, distention or other pain Genitourinary: Negative for hematuria, flank pain or other change in urine volume.  Musculoskeletal: Negative for myalgias or other joint swelling.  Skin: Negative for  other color change, or other wound or worsening drainage.  Neurological: Negative for other syncope or numbness. Hematological: Negative for other adenopathy or swelling Psychiatric/Behavioral: Negative for hallucinations, other worsening agitation, SI, self-injury, or new decreased concentration All other system neg per pt    Objective:   Physical Exam BP 126/84   Pulse 92   Ht 5\' 2"  (1.575 m)   Wt 126 lb (57.2 kg)   SpO2 98%   BMI 23.05 kg/m  VS noted,  Constitutional: Pt is oriented to person, place, and time. Appears well-developed and well-nourished, in no significant distress and comfortable Head: Normocephalic and atraumatic  Eyes: Conjunctivae and EOM are normal. Pupils are equal, round, and reactive to light Right Ear: External ear normal without discharge Left Ear: External ear normal without discharge Nose: Nose without discharge or deformity Mouth/Throat: Oropharynx is without other ulcerations and moist  Neck: Normal range of motion. Neck supple. No JVD present. No tracheal deviation present or significant neck LA or mass Cardiovascular: Normal rate, regular rhythm, normal heart sounds and intact distal pulses.   Pulmonary/Chest: WOB normal and breath sounds without rales or wheezing  Abdominal: Soft. Bowel sounds are normal. NT. No HSM  Musculoskeletal: Normal range of motion. Exhibits no edema, + tender over left TMJ Lymphadenopathy: Has no other cervical adenopathy.  Neurological: Pt is alert and oriented to person, place, and time. Pt has normal reflexes. No cranial nerve deficit. Motor grossly intact, Gait intact Skin: Skin is warm and dry. No rash noted or new ulcerations Psychiatric:  Has normal mood and affect. Behavior is normal without agitation No other exam findings  Lab Results  Component Value Date   WBC 8.4 08/30/2016   HGB 11.7 (L) 08/30/2016   HCT 35.9 (L) 08/30/2016   PLT 429.0 (H) 08/30/2016   GLUCOSE 300 (H) 08/30/2016   CHOL 119 08/30/2016    TRIG 82.0 08/30/2016   HDL 61.80 08/30/2016   LDLCALC 41 08/30/2016   ALT 15 08/30/2016   AST 15 08/30/2016   NA 140 08/30/2016   K 3.9 08/30/2016   CL 103 08/30/2016   CREATININE 0.71 08/30/2016   BUN 12 08/30/2016   CO2 28 08/30/2016   TSH 0.67 02/28/2016   HGBA1C 7.2 (H) 08/30/2016   MICROALBUR 1.9 02/28/2016          Assessment & Plan:

## 2017-03-26 NOTE — Patient Instructions (Signed)
Please continue all other medications as before, and refills have been done if requested.  Please have the pharmacy call with any other refills you may need.  Please continue your efforts at being more active, low cholesterol diet, and weight control.  You are otherwise up to date with prevention measures today.  Please keep your appointments with your specialists as you may have planned  You will be contacted regarding the referral for: ENT  Please go to the LAB in the Basement (turn left off the elevator) for the tests to be done   You will be contacted by phone if any changes need to be made immediately.  Otherwise, you will receive a letter about your results with an explanation, but please check with MyChart first.  Please remember to sign up for MyChart if you have not done so, as this will be important to you in the future with finding out test results, communicating by private email, and scheduling acute appointments online when needed.  Please return in 6 months, or sooner if needed, with Lab testing done 3-5 days before

## 2017-03-29 NOTE — Assessment & Plan Note (Signed)
Ok for ent referral per pt request

## 2017-03-29 NOTE — Assessment & Plan Note (Signed)
Goal ldl < 70, for f/u lab  

## 2017-03-29 NOTE — Assessment & Plan Note (Signed)
stable overall by history and exam, recent data reviewed with pt, and pt to continue medical treatment as before,  to f/u any worsening symptoms or concerns Lab Results  Component Value Date   HGBA1C 7.2 (H) 08/30/2016  Pt states will have blood work done next wk in f/u as she ran out of time today

## 2017-03-29 NOTE — Assessment & Plan Note (Signed)
stable overall by history and exam, recent data reviewed with pt, and pt to continue medical treatment as before,  to f/u any worsening symptoms or concerns BP Readings from Last 3 Encounters:  03/26/17 126/84  02/05/17 (!) 150/100  09/30/16 136/77

## 2017-03-29 NOTE — Assessment & Plan Note (Signed)

## 2017-04-08 ENCOUNTER — Other Ambulatory Visit: Payer: Self-pay | Admitting: Internal Medicine

## 2017-04-08 ENCOUNTER — Other Ambulatory Visit (INDEPENDENT_AMBULATORY_CARE_PROVIDER_SITE_OTHER): Payer: BLUE CROSS/BLUE SHIELD

## 2017-04-08 DIAGNOSIS — Z0001 Encounter for general adult medical examination with abnormal findings: Secondary | ICD-10-CM

## 2017-04-08 DIAGNOSIS — E119 Type 2 diabetes mellitus without complications: Secondary | ICD-10-CM

## 2017-04-08 DIAGNOSIS — Z114 Encounter for screening for human immunodeficiency virus [HIV]: Secondary | ICD-10-CM

## 2017-04-08 LAB — HEPATIC FUNCTION PANEL
ALBUMIN: 4 g/dL (ref 3.5–5.2)
ALK PHOS: 65 U/L (ref 39–117)
ALT: 11 U/L (ref 0–35)
AST: 12 U/L (ref 0–37)
Bilirubin, Direct: 0.1 mg/dL (ref 0.0–0.3)
TOTAL PROTEIN: 6.7 g/dL (ref 6.0–8.3)
Total Bilirubin: 0.4 mg/dL (ref 0.2–1.2)

## 2017-04-08 LAB — URINALYSIS, ROUTINE W REFLEX MICROSCOPIC
Bilirubin Urine: NEGATIVE
HGB URINE DIPSTICK: NEGATIVE
Ketones, ur: NEGATIVE
Leukocytes, UA: NEGATIVE
Nitrite: NEGATIVE
RBC / HPF: NONE SEEN (ref 0–?)
Total Protein, Urine: NEGATIVE
Urine Glucose: >=1000 — AB
Urobilinogen, UA: 0.2 (ref 0.0–1.0)
pH: 5.5 (ref 5.0–8.0)

## 2017-04-08 LAB — HEMOGLOBIN A1C: HEMOGLOBIN A1C: 8.2 % — AB (ref 4.6–6.5)

## 2017-04-08 LAB — BASIC METABOLIC PANEL
BUN: 11 mg/dL (ref 6–23)
CALCIUM: 9.6 mg/dL (ref 8.4–10.5)
CO2: 31 meq/L (ref 19–32)
CREATININE: 0.76 mg/dL (ref 0.40–1.20)
Chloride: 100 mEq/L (ref 96–112)
GFR: 99.46 mL/min (ref >60.00)
GLUCOSE: 346 mg/dL — AB (ref 70–99)
Potassium: 3.9 mEq/L (ref 3.5–5.1)
Sodium: 138 mEq/L (ref 135–145)

## 2017-04-08 LAB — CBC WITH DIFFERENTIAL/PLATELET
BASOS ABS: 0.1 10*3/uL (ref 0.0–0.1)
Basophils Relative: 1.3 % (ref 0.0–3.0)
Eosinophils Absolute: 0.4 10*3/uL (ref 0.0–0.7)
Eosinophils Relative: 5.1 % — ABNORMAL HIGH (ref 0.0–5.0)
HEMATOCRIT: 36.2 % (ref 36.0–46.0)
Hemoglobin: 11.5 g/dL — ABNORMAL LOW (ref 12.0–15.0)
LYMPHS ABS: 2.2 10*3/uL (ref 0.7–4.0)
LYMPHS PCT: 26.2 % (ref 12.0–46.0)
MCHC: 31.8 g/dL (ref 30.0–36.0)
MCV: 79.2 fl (ref 78.0–100.0)
MONOS PCT: 5.2 % (ref 3.0–12.0)
Monocytes Absolute: 0.4 10*3/uL (ref 0.1–1.0)
Neutro Abs: 5.2 10*3/uL (ref 1.4–7.7)
Neutrophils Relative %: 62.2 % (ref 43.0–77.0)
PLATELETS: 443 10*3/uL — AB (ref 150.0–400.0)
RBC: 4.56 Mil/uL (ref 3.87–5.11)
RDW: 16 % — AB (ref 11.5–15.5)
WBC: 8.4 10*3/uL (ref 4.0–10.5)

## 2017-04-08 LAB — LIPID PANEL
CHOLESTEROL: 139 mg/dL (ref 0–200)
HDL: 59.7 mg/dL (ref >39.00)
LDL CALC: 57 mg/dL (ref 0–99)
NonHDL: 79.67
TRIGLYCERIDES: 111 mg/dL (ref 0.0–149.0)
Total CHOL/HDL Ratio: 2
VLDL: 22.2 mg/dL (ref 0.0–40.0)

## 2017-04-08 LAB — TSH: TSH: 0.69 u[IU]/mL (ref 0.35–4.50)

## 2017-04-08 LAB — MICROALBUMIN / CREATININE URINE RATIO
Creatinine,U: 77 mg/dL
Microalb Creat Ratio: 1.8 mg/g (ref 0.0–30.0)
Microalb, Ur: 1.4 mg/dL (ref 0.0–1.9)

## 2017-04-08 MED ORDER — LINAGLIPTIN 5 MG PO TABS: 5.0000 mg | ORAL_TABLET | Freq: Every day | ORAL | 3 refills | Status: DC

## 2017-04-09 ENCOUNTER — Telehealth: Payer: Self-pay

## 2017-04-09 LAB — HIV ANTIBODY (ROUTINE TESTING W REFLEX): HIV 1&2 Ab, 4th Generation: NONREACTIVE

## 2017-04-09 NOTE — Telephone Encounter (Signed)
Called pt, LVM.   

## 2017-04-09 NOTE — Telephone Encounter (Signed)
-----   Message from Corwin LevinsJames W John, MD sent at 04/08/2017  9:13 PM EDT ----- Left message on MyChart, pt to cont same tx except  The test results show that your current treatment is OK, except the a1c is elevated too high.  We need to add a medication called Tradjenta 5 mg per day.  I will send a new prescription, and you should be called about this.Lendell Caprice.    Hiromi Knodel to please inform pt, I will do rx

## 2017-04-09 NOTE — Telephone Encounter (Signed)
Patient called back. She is going to check her mychart and call if if she has any questions.

## 2017-04-17 ENCOUNTER — Other Ambulatory Visit: Payer: Self-pay | Admitting: Internal Medicine

## 2017-04-17 DIAGNOSIS — M26629 Arthralgia of temporomandibular joint, unspecified side: Secondary | ICD-10-CM

## 2017-04-17 DIAGNOSIS — J301 Allergic rhinitis due to pollen: Secondary | ICD-10-CM | POA: Diagnosis not present

## 2017-04-17 HISTORY — DX: Arthralgia of temporomandibular joint, unspecified side: M26.629

## 2017-04-17 NOTE — Telephone Encounter (Signed)
Done erx 

## 2017-05-05 LAB — HM DIABETES EYE EXAM

## 2017-06-25 ENCOUNTER — Ambulatory Visit (INDEPENDENT_AMBULATORY_CARE_PROVIDER_SITE_OTHER): Payer: BLUE CROSS/BLUE SHIELD

## 2017-06-25 DIAGNOSIS — Z23 Encounter for immunization: Secondary | ICD-10-CM | POA: Diagnosis not present

## 2017-06-25 DIAGNOSIS — Z01419 Encounter for gynecological examination (general) (routine) without abnormal findings: Secondary | ICD-10-CM | POA: Diagnosis not present

## 2017-06-25 DIAGNOSIS — Z6824 Body mass index (BMI) 24.0-24.9, adult: Secondary | ICD-10-CM | POA: Diagnosis not present

## 2017-06-25 DIAGNOSIS — Z0000 Encounter for general adult medical examination without abnormal findings: Secondary | ICD-10-CM | POA: Diagnosis not present

## 2017-08-15 DIAGNOSIS — Z1231 Encounter for screening mammogram for malignant neoplasm of breast: Secondary | ICD-10-CM | POA: Diagnosis not present

## 2017-10-21 ENCOUNTER — Other Ambulatory Visit: Payer: Self-pay | Admitting: Internal Medicine

## 2017-11-06 ENCOUNTER — Other Ambulatory Visit: Payer: Self-pay | Admitting: Internal Medicine

## 2017-12-12 ENCOUNTER — Encounter: Payer: Self-pay | Admitting: Internal Medicine

## 2017-12-12 ENCOUNTER — Ambulatory Visit (INDEPENDENT_AMBULATORY_CARE_PROVIDER_SITE_OTHER): Payer: BLUE CROSS/BLUE SHIELD | Admitting: Internal Medicine

## 2017-12-12 VITALS — BP 134/88 | HR 96 | Temp 97.9°F | Ht 62.0 in | Wt 126.0 lb

## 2017-12-12 DIAGNOSIS — E119 Type 2 diabetes mellitus without complications: Secondary | ICD-10-CM | POA: Diagnosis not present

## 2017-12-12 DIAGNOSIS — M5412 Radiculopathy, cervical region: Secondary | ICD-10-CM | POA: Diagnosis not present

## 2017-12-12 DIAGNOSIS — I10 Essential (primary) hypertension: Secondary | ICD-10-CM | POA: Diagnosis not present

## 2017-12-12 DIAGNOSIS — M7551 Bursitis of right shoulder: Secondary | ICD-10-CM

## 2017-12-12 HISTORY — DX: Bursitis of right shoulder: M75.51

## 2017-12-12 HISTORY — DX: Radiculopathy, cervical region: M54.12

## 2017-12-12 MED ORDER — TRAMADOL HCL 50 MG PO TABS: 50.0000 mg | ORAL_TABLET | Freq: Four times a day (QID) | ORAL | 1 refills | Status: DC | PRN

## 2017-12-12 MED ORDER — METHYLPREDNISOLONE 4 MG PO TBPK: ORAL_TABLET | ORAL | 0 refills | Status: DC

## 2017-12-12 MED ORDER — CYCLOBENZAPRINE HCL 5 MG PO TABS: 5.0000 mg | ORAL_TABLET | Freq: Three times a day (TID) | ORAL | 1 refills | Status: DC | PRN

## 2017-12-12 NOTE — Patient Instructions (Signed)
Please take all new medication as prescribed - the pain medication, steroid anti inflammatory medication, and the muscle relaxer to use as needed  You will be contacted regarding the referral for: MRI for the neck, and sports medicine  (although you can make an appt with sports medicine at the checkout desk as you leave today as well)  Please continue all other medications as before, and refills have been done if requested.  Please have the pharmacy call with any other refills you may need.  Please keep your appointments with your specialists as you may have planned

## 2017-12-12 NOTE — Progress Notes (Signed)
Subjective:    Patient ID: Ruth Crawford, female    DOB: 12/10/1955, 62 y.o.   MRN: [id number]  HPI  Here with 3 wks onset right neck pain as well as even longer several months right shoulder pain and tenderness, overall now moderate, constant, sharp and dull and associated with radiation of pain to right hand with numbness and mild weakness; all somewhat worse with lying on right side, better to not.  No prior hx or neck or shoulder pain.  No fever, trauma.  Pt denies chest pain, increased sob or doe, wheezing, orthopnea, PND, increased LE swelling, palpitations, dizziness or syncope.   Pt denies polydipsia, polyuria  No other interval hx or change Past Medical History:  Diagnosis Date  . Allergy   . ANXIETY   . COMMON MIGRAINE   . DEPRESSION   . DIABETES MELLITUS, TYPE II   . GERD (gastroesophageal reflux disease) 10/17/2012  . HYPERLIPIDEMIA   . HYPERTENSION   . POLYP, GALLBLADDER   . Sickle cell trait [facility](HCC)    Past Surgical History:  Procedure Laterality Date  . ABDOMINAL HYSTERECTOMY  2000  . COLONOSCOPY    . LUMBAR DISC SURGERY  1998, 2016, 2017   s/p    reports that she has never smoked. She has never used smokeless tobacco. She reports that she drinks alcohol. She reports that she does not use drugs. family history includes Alcohol abuse in her other; Arthritis in her other; Diabetes in her mother; Heart disease in her father; Stroke in her other. Allergies  Allergen Reactions  . Demerol [Meperidine] Other (See Comments)    Per pt: unknown  . Prednisone Itching and Rash   Current Outpatient Medications on File Prior to Visit  Medication Sig Dispense Refill  . Aspirin-Acetaminophen-Caffeine (EXCEDRIN MIGRAINE PO) Take by mouth as needed.    Marland Kitchen. azelastine (OPTIVAR) 0.05 % ophthalmic solution INSTILL ONE DROP INTO EACH EYE TWICE DAILY 6 mL 12  . diphenhydrAMINE (BENADRYL) 25 MG tablet Take 25 mg by mouth daily as needed.    Marland Kitchen. FLUoxetine (PROZAC) 20 MG capsule TAKE ONE CAPSULE  BY MOUTH ONCE DAILY 90 capsule 3  . GLIPIZIDE XL 5 MG 24 hr tablet TAKE ONE TABLET BY MOUTH TWICE DAILY 180 tablet 1  . glucose blood test strip 1 each by Other route as needed for other. RELION PRIME TEST STRIPS  Use as instructed    . linagliptin (TRADJENTA) 5 MG TABS tablet Take 1 tablet (5 mg total) by mouth daily. 90 tablet 3  . lisinopril (PRINIVIL,ZESTRIL) 10 MG tablet TAKE 1 TABLET BY MOUTH ONCE DAILY 90 tablet 1  . lovastatin (MEVACOR) 40 MG tablet TAKE ONE TABLET BY MOUTH ONCE DAILY 90 tablet 3  . meloxicam (MOBIC) 15 MG tablet Take 1 tablet (15 mg total) by mouth daily as needed for pain. 30 tablet 2  . metFORMIN (GLUCOPHAGE) 1000 MG tablet TAKE ONE TABLET BY MOUTH TWICE DAILY WITH MEALS 180 tablet 1  . phenylephrine (SUDAFED PE) 10 MG TABS tablet Take 10 mg by mouth daily.    Marland Kitchen. RELION LANCETS STANDARD 21G MISC by Does not apply route.     No current facility-administered medications on file prior to visit.    Review of Systems  Constitutional: Negative for other unusual diaphoresis or sweats HENT: Negative for ear discharge or swelling Eyes: Negative for other worsening visual disturbances Respiratory: Negative for stridor or other swelling  Gastrointestinal: Negative for worsening distension or other blood Genitourinary: Negative for  retention or other urinary change Musculoskeletal: Negative for other MSK pain or swelling Skin: Negative for color change or other new lesions Neurological: Negative for worsening tremors and other numbness  Psychiatric/Behavioral: Negative for worsening agitation or other fatigue All other system neg per pt    Objective:   Physical Exam BP 134/88   Pulse 96   Temp 97.9 F (36.6 C) (Oral)   Ht 5\' 2"  (1.575 m)   Wt 126 lb (57.2 kg)   SpO2 98%   BMI 23.05 kg/m  VS noted,  Constitutional: Pt appears in NAD HENT: Head: NCAT.  Right Ear: External ear normal.  Left Ear: External ear normal.  Eyes: . Pupils are equal, round, and reactive  to light. Conjunctivae and EOM are normal Nose: without d/c or deformity Neck: Neck supple. Gross normal ROM Cardiovascular: Normal rate and regular rhythm.   Pulmonary/Chest: Effort normal and breath sounds without rales or wheezing.  Cervical spine  Tender low midline and right paracervical, tender right trapezoid, tender subacromial area right shoulder, with 4+/5 RUE weakness  Neurological: Pt is alert. At baseline orientation, motor grossly intact Skin: Skin is warm. No rashes, other new lesions, no LE edema Psychiatric: Pt behavior is normal without agitation  No other exam findings    Assessment & Plan:

## 2017-12-14 NOTE — Assessment & Plan Note (Signed)
Also for med tx as above, also for sport med referral,  to f/u any worsening symptoms or concerns

## 2017-12-14 NOTE — Assessment & Plan Note (Signed)
stable overall by history and exam, recent data reviewed with pt, and pt to continue medical treatment as before,  to f/u any worsening symptoms or concerns, for f/u a1c with labs 

## 2017-12-14 NOTE — Assessment & Plan Note (Signed)
stable overall by history and exam, recent data reviewed with pt, and pt to continue medical treatment as before,  to f/u any worsening symptoms or concerns BP Readings from Last 3 Encounters:  12/12/17 134/88  03/26/17 126/84  02/05/17 (!) 150/100

## 2017-12-14 NOTE — Assessment & Plan Note (Signed)
Ok for pain med control, predpac asd, and muscle relaxer prn, for MRI c spine,  to f/u any worsening symptoms or concerns

## 2017-12-26 ENCOUNTER — Encounter: Payer: Self-pay | Admitting: Internal Medicine

## 2018-01-04 NOTE — Progress Notes (Signed)
Ruth ScaleZach Gabriana Crawford D.O. [facility] [address] [place], [place] [address] Phone: [phone] Subjective:    I'm seeing this patient by the request  of:  Corwin LevinsJohn, James W, MD   CC: Right arm pain  BJY:NWGNFAOZHYHPI:Subjective  Ruth PlaterSandy L Crawford is a 62 y.o. female coming in with complaint of right arm pain.  Pain is been going on for several weeks.  Has been having neck pain longer than that.  Describes the pain is more of a tenderness that is moderate in severity.  Seems to be constant.  Sometimes sharp and dull.  Sometimes radiates only to the right hand and has mild weakness.  Patient has not tried any modalities at this time.  Patient states that it seems to be worsening over the course of time.  Can wake her up at night.  Does not remember any true injury.  Patient is scheduled to have an MRI of the neck but feels like it is seems to be more located to the shoulder region.      Past Medical History:  Diagnosis Date  . Allergy   . ANXIETY   . COMMON MIGRAINE   . DEPRESSION   . DIABETES MELLITUS, TYPE II   . GERD (gastroesophageal reflux disease) 10/17/2012  . HYPERLIPIDEMIA   . HYPERTENSION   . POLYP, GALLBLADDER   . Sickle cell trait [facility](HCC)    Past Surgical History:  Procedure Laterality Date  . ABDOMINAL HYSTERECTOMY  2000  . COLONOSCOPY    . LUMBAR DISC SURGERY  1998, 2016, 2017   s/p   Social History   Socioeconomic History  . Marital status: Married    Spouse name: Not on file  . Number of children: 1  . Years of education: Not on file  . Highest education level: Not on file  Occupational History  . Occupation: Teacher, musicbilling clerk    Comment: at [employer]  Social Needs  . Financial resource strain: Not on file  . Food insecurity:    Worry: Not on file    Inability: Not on file  . Transportation needs:    Medical: Not on file    Non-medical: Not on file  Tobacco Use  . Smoking status: Never Smoker  . Smokeless tobacco: Never Used  Substance and Sexual Activity    . Alcohol use: Yes    Comment: occasional use  . Drug use: No  . Sexual activity: Not on file  Lifestyle  . Physical activity:    Days per week: Not on file    Minutes per session: Not on file  . Stress: Not on file  Relationships  . Social connections:    Talks on phone: Not on file    Gets together: Not on file    Attends religious service: Not on file    Active member of club or organization: Not on file    Attends meetings of clubs or organizations: Not on file    Relationship status: Not on file  Other Topics Concern  . Not on file  Social History Narrative  . Not on file   Allergies  Allergen Reactions  . Demerol [Meperidine] Other (See Comments)    Per pt: unknown  . Prednisone Itching and Rash   Family History  Problem Relation Age of Onset  . Diabetes Mother   . Heart disease Father   . Stroke Other   . Alcohol abuse Other   . Arthritis Other   . Colon cancer Neg Hx   .  Esophageal cancer Neg Hx   . Rectal cancer Neg Hx   . Stomach cancer Neg Hx      Past medical history, social, surgical and family history all reviewed in electronic medical record.  No pertanent information unless stated regarding to the chief complaint.   Review of Systems:Review of systems updated and as accurate as of 01/05/18  No headache, visual changes, nausea, vomiting, diarrhea, constipation, dizziness, abdominal pain, skin rash, fevers, chills, night sweats, weight loss, swollen lymph nodes, body aches, joint swelling, muscle aches, chest pain, shortness of breath, mood changes.   Objective  Blood pressure 122/84, pulse 80, height 5\' 2"  (1.575 m), weight 128 lb (58.1 kg), SpO2 98 %. Systems examined below as of 01/05/18   General: No apparent distress alert and oriented x3 mood and affect normal, dressed appropriately.  HEENT: Pupils equal, extraocular movements intact  Respiratory: Patient's speak in full sentences and does not appear short of breath  Cardiovascular: No lower  extremity edema, non tender, no erythema  Skin: Warm dry intact with no signs of infection or rash on extremities or on axial skeleton.  Abdomen: Soft nontender  Neuro: Cranial nerves II through XII are intact, neurovascularly intact in all extremities with 2+ DTRs and 2+ pulses.  Lymph: No lymphadenopathy of posterior or anterior cervical chain or axillae bilaterally.  Gait normal with good balance and coordination.  MSK:  Non tender with full range of motion and good stability and symmetric strength and tone of elbows, wrist, hip, knee and ankles bilaterally.  Shoulder: Right Inspection reveals no abnormalities, atrophy or asymmetry. Palpation is normal with no tenderness over AC joint or bicipital groove. ROM is full in all planes passively. Rotator cuff strength normal throughout. signs of impingement with positive Neer and Hawkin's tests, but negative empty can sign. Speeds and Yergason's tests normal. Positive O'Brien's Mild painful arc No apprehension sign Contralateral shoulder unremarkable  MSK US performed of: Right This study was ordered, performed, and interpreted by Terrilee Files D.O.  Shoulder:   Supraspinatus: Partial-thickness tear noted.  Nearly high-grade.  No significant retraction.  Patient does have an anterior cyst noted near the subscapularis Infraspinatus:  Appears normal on long and transverse views. Significant increase in Doppler flow AC joint: Mild arthritic changes. Glenohumeral Joint:  Appears normal without effusion. Glenoid Labrum:  Intact without visualized tears. Biceps Tendon:  Appears normal on long and transverse views, no fraying of tendon, tendon located in intertubercular groove, no subluxation with shoulder internal or external rotation.  Impression: Subacromial bursitis anterior with a rotator cuff tear partial thickness anterior cyst noted  Procedure: Real-time Ultrasound Guided Injection of right glenohumeral joint Device: GE Logiq E    Ultrasound guided injection is preferred based studies that show increased duration, increased effect, greater accuracy, decreased procedural pain, increased response rate with ultrasound guided versus blind injection.  Verbal informed consent obtained.  Time-out conducted.  Noted no overlying erythema, induration, or other signs of local infection.  Skin prepped in a sterile fashion.  Local anesthesia: Topical Ethyl chloride.  With sterile technique and under real time ultrasound guidance:  Joint visualized.  23g 1  inch needle inserted anterior approach. Pictures taken for needle placement. Patient did have injection of  2 cc of 0.5% Marcaine, aspirated 5 cc of strawlike color fluid then injected  1.0 cc of Kenalog 40 mg/dL. Completed without difficulty  Pain immediately resolved suggesting accurate placement of the medication.  Advised to call if fevers/chills, erythema, induration, drainage, or persistent  bleeding.  Images permanently stored and available for review in the ultrasound unit.  Impression: Technically successful ultrasound guided injection.  [id number]; 15 additional minutes spent for Therapeutic exercises as stated in above notes.  This included exercises focusing on stretching, strengthening, with significant focus on eccentric aspects.   Long term goals include an improvement in range of motion, strength, endurance as well as avoiding reinjury. Patient's frequency would include in 1-2 times a day, 3-5 times a week for a duration of 6-12 weeks.Shoulder Exercises that included:  Basic scapular stabilization to include adduction and depression of scapula Scaption, focusing on proper movement and good control Internal and External rotation utilizing a theraband, with elbow tucked at side entire time Rows with theraband which was given    Proper technique shown and discussed handout in great detail with ATC.  All questions were discussed and answered.     Impression and  Recommendations:     This case required medical decision making of moderate complexity.      Note: This dictation was prepared with Dragon dictation along with smaller phrase technology. Any transcriptional errors that result from this process are unintentional.

## 2018-01-05 ENCOUNTER — Ambulatory Visit (INDEPENDENT_AMBULATORY_CARE_PROVIDER_SITE_OTHER): Payer: BLUE CROSS/BLUE SHIELD | Admitting: Family Medicine

## 2018-01-05 ENCOUNTER — Ambulatory Visit: Payer: Self-pay

## 2018-01-05 ENCOUNTER — Encounter: Payer: Self-pay | Admitting: Family Medicine

## 2018-01-05 VITALS — BP 122/84 | HR 80 | Ht 62.0 in | Wt 128.0 lb

## 2018-01-05 DIAGNOSIS — M75101 Unspecified rotator cuff tear or rupture of right shoulder, not specified as traumatic: Secondary | ICD-10-CM | POA: Insufficient documentation

## 2018-01-05 DIAGNOSIS — M25511 Pain in right shoulder: Secondary | ICD-10-CM

## 2018-01-05 DIAGNOSIS — M75111 Incomplete rotator cuff tear or rupture of right shoulder, not specified as traumatic: Secondary | ICD-10-CM

## 2018-01-05 DIAGNOSIS — M7551 Bursitis of right shoulder: Secondary | ICD-10-CM

## 2018-01-05 HISTORY — DX: Unspecified rotator cuff tear or rupture of right shoulder, not specified as traumatic: M75.101

## 2018-01-05 NOTE — Assessment & Plan Note (Signed)
Aspirated and injected.  Discussed icing regimen and home exercises.  Did not feel that there was significant cervical radiculopathy today.  X-rays ordered today.  Does have a rotator cuff tear and will need to monitor.  Work with Event organiserathletic trainer to learn home exercises.  Follow-up again in 4 weeks

## 2018-01-05 NOTE — Patient Instructions (Signed)
Good to see you  Ice is your friend Exercises 3 times a week.  Keep hands within peripheral vision  pennsaid pinkie amount topically 2 times daily as needed.   Aspirated the shoulder today  See me again in 4 weeks.

## 2018-01-05 NOTE — Assessment & Plan Note (Signed)
Patient has an incomplete nontraumatic rotator cuff tear.  Discussed with patient in great length about icing regimen, home exercise, which activities to do which wants to avoid.  Patient is to increase activity slowly over the course the next several weeks.  Patient wanted to try conservative therapy and an aspiration of the cyst that was noted.  With it being anterior and possible labral pathology and we will monitor.  Patient will follow up with me again 4-6 weeks.

## 2018-01-06 ENCOUNTER — Other Ambulatory Visit: Payer: Self-pay | Admitting: Internal Medicine

## 2018-01-13 ENCOUNTER — Telehealth: Payer: Self-pay | Admitting: Internal Medicine

## 2018-01-13 ENCOUNTER — Ambulatory Visit (INDEPENDENT_AMBULATORY_CARE_PROVIDER_SITE_OTHER)
Admission: RE | Admit: 2018-01-13 | Discharge: 2018-01-13 | Disposition: A | Discharge status: Home / Self Care | Payer: BLUE CROSS/BLUE SHIELD | Source: Ambulatory Visit | Attending: Family Medicine | Admitting: Family Medicine

## 2018-01-13 DIAGNOSIS — M25511 Pain in right shoulder: Secondary | ICD-10-CM | POA: Diagnosis not present

## 2018-01-13 DIAGNOSIS — M19011 Primary osteoarthritis, right shoulder: Secondary | ICD-10-CM | POA: Diagnosis not present

## 2018-01-13 NOTE — Telephone Encounter (Signed)
Spoke with patient and let her know that samples of pennsaid are up front.

## 2018-01-13 NOTE — Telephone Encounter (Signed)
Copied from CRM [id number]. Topic: Quick Communication - See Telephone Encounter >> Jan 13, 2018 12:28 PM Lorrine Kin, NT wrote: CRM for notification. See Telephone encounter for: 01/13/18. Patient calling to inquire about why she has not heard anything about her x-ray appointment. States it has been over a week and she has not heard anything. Please advise. CB#: [phone]

## 2018-01-13 NOTE — Telephone Encounter (Signed)
Let patient know an appt was not needed she will come at her convenience and get the XRAY   She would also like more samples of Pennsaid she is out and said ti was really helping.

## 2018-01-16 NOTE — Telephone Encounter (Signed)
Pt would like call to discuss in more detail the results of her xray. Please call [phone]

## 2018-01-26 NOTE — Telephone Encounter (Signed)
Left message for patient to call back to answer questions about xray results.

## 2018-02-01 NOTE — Progress Notes (Signed)
[facility] [address] [place], [place] [address] Phone: [phone] Subjective:    CC: Right shoulder pain follow-up  BJY:NWGNFAOZHY  Ruth Crawford is a 62 y.o. female coming in with complaint of right shoulder pain follow-up patient was found to have an anterior cyst as well as a small partial-thickness tear of the rotator cuff.  Given injection.  Patient states that she was 100% better for 2 weeks and now having some increasing discomfort again.  Still approximately 60 to 70% better.  Notices certain movements some increasing pain.    Past Medical History:  Diagnosis Date  . Allergy   . ANXIETY   . COMMON MIGRAINE   . DEPRESSION   . DIABETES MELLITUS, TYPE II   . GERD (gastroesophageal reflux disease) 10/17/2012  . HYPERLIPIDEMIA   . HYPERTENSION   . POLYP, GALLBLADDER   . Sickle cell trait [facility])    Past Surgical History:  Procedure Laterality Date  . ABDOMINAL HYSTERECTOMY  2000  . COLONOSCOPY    . LUMBAR DISC SURGERY  1998, 2016, 2017   s/p   Social History   Socioeconomic History  . Marital status: Married    Spouse name: Not on file  . Number of children: 1  . Years of education: Not on file  . Highest education level: Not on file  Occupational History  . Occupation: Teacher, music    Comment: at [employer]  Social Needs  . Financial resource strain: Not on file  . Food insecurity:    Worry: Not on file    Inability: Not on file  . Transportation needs:    Medical: Not on file    Non-medical: Not on file  Tobacco Use  . Smoking status: Never Smoker  . Smokeless tobacco: Never Used  Substance and Sexual Activity  . Alcohol use: Yes    Comment: occasional use  . Drug use: No  . Sexual activity: Not on file  Lifestyle  . Physical activity:    Days per week: Not on file    Minutes per session: Not on file  . Stress: Not on file  Relationships  . Social connections:    Talks on phone: Not on file    Gets  together: Not on file    Attends religious service: Not on file    Active member of club or organization: Not on file    Attends meetings of clubs or organizations: Not on file    Relationship status: Not on file  Other Topics Concern  . Not on file  Social History Narrative  . Not on file   Allergies  Allergen Reactions  . Demerol [Meperidine] Other (See Comments)    Per pt: unknown  . Prednisone Itching and Rash   Family History  Problem Relation Age of Onset  . Diabetes Mother   . Heart disease Father   . Stroke Other   . Alcohol abuse Other   . Arthritis Other   . Colon cancer Neg Hx   . Esophageal cancer Neg Hx   . Rectal cancer Neg Hx   . Stomach cancer Neg Hx      Past medical history, social, surgical and family history all reviewed in electronic medical record.  No pertanent information unless stated regarding to the chief complaint.   Review of Systems:Review of systems updated and as accurate as of 02/02/18  No headache, visual changes, nausea, vomiting, diarrhea, constipation, dizziness, abdominal pain, skin rash, fevers,  chills, night sweats, weight loss, swollen lymph nodes, body aches, joint swelling, chest pain, shortness of breath, mood changes.  Positive muscle aches  Objective  Blood pressure 120/80, pulse 85, height  (1.575 m), weight 123 lb (55.8 kg), SpO2 95 %. Systems examined below as of 02/02/18   General: No apparent distress alert and oriented x3 mood and affect normal, dressed appropriately.  HEENT: Pupils equal, extraocular movements intact  Respiratory: Patient's speak in full sentences and does not appear short of breath  Cardiovascular: No lower extremity edema, non tender, no erythema  Skin: Warm dry intact with no signs of infection or rash on extremities or on axial skeleton.  Abdomen: Soft nontender  Neuro: Cranial nerves II through XII are intact, neurovascularly intact in all extremities with 2+ DTRs and 2+ pulses.  Lymph: No  lymphadenopathy of posterior or anterior cervical chain or axillae bilaterally.  Gait normal with good balance and coordination.  MSK:  Non tender with full range of motion and good stability and symmetric strength and tone of  elbows, wrist, hip, knee and ankles bilaterally.  Shoulder: Right Inspection reveals no abnormalities, atrophy or asymmetry. Palpation is normal with no tenderness over AC joint or bicipital groove. ROM is full in all planes. Rotator cuff strength normal throughout. Positive impingement Speeds and Yergason's tests normal. No labral pathology noted with negative Obrien's, negative clunk and good stability. Normal scapular function observed. No apprehension sign Shoulder unremarkable  MSK US performed of: right shoulder This study was ordered, performed, and interpreted by Terrilee Files D.O.  Shoulder:   Supraspinatus: Partial-thickness tear noted.  Some interval healing noted. AC joint:  Moderate OA Glenohumeral Joint:  Appears normal without effusion. Glenoid Labrum:  Intact without visualized tears. Biceps Tendon: Hypoechoic changes still within the tendon sheath noted.  Anterior is is significantly smaller than previous exam Impression: Interval healing partial-thickness tear      Impression and Recommendations:     This case required medical decision making of moderate complexity.      Note: This dictation was prepared with Dragon dictation along with smaller phrase technology. Any transcriptional errors that result from this process are unintentional.

## 2018-02-02 ENCOUNTER — Encounter: Payer: Self-pay | Admitting: Family Medicine

## 2018-02-02 ENCOUNTER — Ambulatory Visit (INDEPENDENT_AMBULATORY_CARE_PROVIDER_SITE_OTHER): Payer: BLUE CROSS/BLUE SHIELD | Admitting: Family Medicine

## 2018-02-02 ENCOUNTER — Ambulatory Visit: Payer: Self-pay

## 2018-02-02 VITALS — BP 120/80 | HR 85 | Ht 62.0 in | Wt 123.0 lb

## 2018-02-02 DIAGNOSIS — M25511 Pain in right shoulder: Secondary | ICD-10-CM | POA: Diagnosis not present

## 2018-02-02 DIAGNOSIS — G8929 Other chronic pain: Secondary | ICD-10-CM | POA: Diagnosis not present

## 2018-02-02 DIAGNOSIS — M75111 Incomplete rotator cuff tear or rupture of right shoulder, not specified as traumatic: Secondary | ICD-10-CM

## 2018-02-02 MED ORDER — VITAMIN D (ERGOCALCIFEROL) 1.25 MG (50000 UNIT) PO CAPS: 50000.0000 [IU] | ORAL_CAPSULE | ORAL | 0 refills | Status: DC

## 2018-02-02 MED ORDER — DICLOFENAC SODIUM 2 % TD SOLN: 2.0000 g | Freq: Two times a day (BID) | TRANSDERMAL | 3 refills | Status: DC

## 2018-02-02 NOTE — Assessment & Plan Note (Signed)
Partial-thickness tear noted.  Patient responded fairly well with the anterior cyst aspiration as well.  It is smaller than previous exam.  Sent to physical therapy to increase activity and range of motion.  Follow-up with me again in 4 weeks

## 2018-02-02 NOTE — Patient Instructions (Addendum)
Good to see you  Overall healing.  I would continue the exercises 3 times a week  pennsaid pinkie amount topically 2 times daily as needed.  They will call you and send in the meds PT will be calling you  Keep hands within peripheral vision  Once weekly vitamin D for 12 weeks, stop daily  See me again in 4 weeks

## 2018-02-06 ENCOUNTER — Other Ambulatory Visit: Payer: Self-pay

## 2018-02-06 ENCOUNTER — Ambulatory Visit (INDEPENDENT_AMBULATORY_CARE_PROVIDER_SITE_OTHER): Payer: BLUE CROSS/BLUE SHIELD | Admitting: Physical Therapy

## 2018-02-06 ENCOUNTER — Encounter: Payer: Self-pay | Admitting: Physical Therapy

## 2018-02-06 DIAGNOSIS — M25611 Stiffness of right shoulder, not elsewhere classified: Secondary | ICD-10-CM | POA: Diagnosis not present

## 2018-02-06 DIAGNOSIS — M6281 Muscle weakness (generalized): Secondary | ICD-10-CM | POA: Diagnosis not present

## 2018-02-06 DIAGNOSIS — M25511 Pain in right shoulder: Secondary | ICD-10-CM | POA: Diagnosis not present

## 2018-02-06 NOTE — Therapy (Signed)
[facility] [place] [address] [place], [place], [address] Phone: [phone]   Fax:  [phone]  Physical Therapy Evaluation  Patient Details  Name: Ruth Crawford MRN: [id number] Date of Birth: 1955-09-27 Referring Provider: Antoine Primas, MD   Encounter Date: 02/06/2018  PT End of Session - 02/06/18 1041    Visit Number  1    Number of Visits  12    PT Start Time  1030    PT Stop Time  1115    PT Time Calculation (min)  45 min    Activity Tolerance  Patient tolerated treatment well    Behavior During Therapy  [facility] for tasks assessed/performed       Past Medical History:  Diagnosis Date  . Allergy   . ANXIETY   . COMMON MIGRAINE   . DEPRESSION   . DIABETES MELLITUS, TYPE II   . GERD (gastroesophageal reflux disease) 10/17/2012  . HYPERLIPIDEMIA   . HYPERTENSION   . POLYP, GALLBLADDER   . Sickle cell trait [facility])     Past Surgical History:  Procedure Laterality Date  . ABDOMINAL HYSTERECTOMY  2000  . COLONOSCOPY    . LUMBAR DISC SURGERY  1998, 2016, 2017   s/p    There were no vitals filed for this visit.   Subjective Assessment - 02/06/18 1036    Subjective  Pt arriving to therapy reporting 3/10 pain. Pt reporting it's only painful with movements. Pt is able to sleep at night and reports she has been doing a few exercises her MD gave her. Pt reporting that her shoulder pain has been going on for at least 3 months.     Pertinent History  previous back surgery x 3    Limitations  Reading;Lifting;House hold activities    Diagnostic tests  X-ray ordered by Dr. Katrinka Blazing about a month ago    Patient Stated Goals  Stop hurting, get back to my daily activites    Currently in Pain?  Yes    Pain Score  3     Pain Location  Shoulder    Pain Orientation  Right    Pain Descriptors / Indicators  Aching    Pain Type  Acute pain    Pain Onset  More than a month ago    Pain Frequency  Intermittent    Aggravating Factors    moving    Pain Relieving Factors  resting, Pennsaid cream    Effect of Pain on Daily Activities  difficulty with houshold chores         [facility] PT Assessment - 02/06/18 0001      Assessment   Medical Diagnosis  R shoulder pain    Referring Provider  Antoine Primas, MD    Onset Date/Surgical Date  12/07/17    Hand Dominance  Right    Next MD Visit  follow up with MD in 1 month for MRI if no improvemnets      Precautions   Precautions  None      Restrictions   Weight Bearing Restrictions  No      Balance Screen   Has the patient fallen in the past 6 months  No    Is the patient reluctant to leave their home because of a fear of falling?   No      Home Public house manager residence    Living Arrangements  Spouse/significant other    Type of Home  House  Home Access  Stairs to enter    Entrance Stairs-Number of Steps  2    Entrance Stairs-Rails  None    Home Layout  One level      Prior Function   Level of Independence  Independent    Vocation  Part time employment    Vocation Requirements  caregiver for elderly    Leisure  going for walks, playing with my dogs      Cognition   Overall Cognitive Status  Within Functional Limits for tasks assessed      Observation/Other Assessments   Focus on Therapeutic Outcomes (FOTO)   44% limitation      Posture/Postural Control   Posture/Postural Control  Postural limitations    Postural Limitations  Rounded Shoulders;Forward head      ROM / Strength   AROM / PROM / Strength  AROM;Strength      AROM   AROM Assessment Site  Shoulder    Right/Left Shoulder  Right;Left    Right Shoulder Extension  45 Degrees    Right Shoulder Flexion  160 Degrees pt reported tightness    Right Shoulder ABduction  130 Degrees painful beginning around 90 degrees    Right Shoulder Internal Rotation  -- T11    Right Shoulder External Rotation  70 Degrees    Left Shoulder Extension  58 Degrees    Left Shoulder Flexion  154  Degrees    Left Shoulder ABduction  150 Degrees    Left Shoulder Internal Rotation  -- T8    Left Shoulder External Rotation  80 Degrees      Strength   Overall Strength Comments  pain with R bicep testing    Strength Assessment Site  Shoulder    Right/Left Shoulder  Right    Right Shoulder Flexion  3+/5    Right Shoulder Extension  4/5    Right Shoulder ABduction  3+/5    Right Shoulder Internal Rotation  3+/5    Right Shoulder External Rotation  3+/5                Objective measurements completed on examination: See above findings.              PT Education - 02/06/18 1040    Education provided  Yes    Education Details  HEP    Person(s) Educated  Patient    Methods  Explanation;Demonstration;Handout;Verbal cues    Comprehension  Returned demonstration;Verbalized understanding       PT Short Term Goals - 02/06/18 1355      PT SHORT TERM GOAL #1   Title  Pt will be independent in her HEP.     Time  2    Period  Weeks    Status  New    Target Date  02/20/18        PT Long Term Goals - 02/06/18 1356      PT LONG TERM GOAL #1   Title  Pt will improve her FOTO score fro 44% limitation to </=33% limitation.     Time  6    Period  Weeks    Status  New    Target Date  03/20/18      PT LONG TERM GOAL #2   Title  Pt will improve her R shoulder external rotation to >/= 70 degrees.     Time  6    Period  Weeks    Status  New    Target Date  03/20/18      PT LONG TERM GOAL #3   Title  Pt will report pain </= 2/10 with ADL's.     Time  6    Period  Weeks    Status  New    Target Date  03/20/18      PT LONG TERM GOAL #4   Title  Pt will improve her R shoulder strength to >/= 4/5 in order to improve functional mobility.     Time  6    Period  Weeks    Status  New    Target Date  03/20/18             Plan - 02/06/18 1412    Clinical Impression Statement  Pt arriving to therapy for evalaution of R shoulder pain. Pt dx with anteior  cysts, and partial thickness tear of her rotator cuf.  Pt recieved an injection on 02/02/18 from Antoine Primas, DO. Pt reporting some improvements but still having pain with ADL's and reaching activities. Pt with mild limitations in AROM and strength. Skilled PT needed to address pt's impairments.     History and Personal Factors relevant to plan of care:  partial thickness tear of Rotator Cuff, Anterior cyst in R shoulder.     Clinical Presentation  Stable    Clinical Decision Making  Low    Rehab Potential  Good    PT Frequency  2x / week    PT Duration  6 weeks    PT Treatment/Interventions  ADLs/Self Care Home Management;Electrical Stimulation;Cryotherapy;Iontophoresis /ml Dexamethasone;Moist Heat;Ultrasound;Therapeutic exercise;Therapeutic activities;Functional mobility training;Patient/family education;Passive range of motion;Manual techniques;Dry needling;Taping;Vasopneumatic Device    PT Next Visit Plan  reviewe HEP, shoulder ROM, gentle strengtening    PT Home Exercise Plan  4 way shoulder isometrics, ER with red theraband, and rows with red theraband    Consulted and Agree with Plan of Care  Patient       Patient will benefit from skilled therapeutic intervention in order to improve the following deficits and impairments:  Pain, Postural dysfunction, Impaired UE functional use, Decreased strength, Decreased range of motion  Visit Diagnosis: Acute pain of right shoulder  Muscle weakness (generalized)  Stiffness of right shoulder, not elsewhere classified     Problem List Patient Active Problem List   Diagnosis Date Noted  . Right rotator cuff tear 01/05/2018  . Right cervical radiculopathy 12/12/2017  . Acute shoulder bursitis, right 12/12/2017  . Left-sided face pain 03/26/2017  . TMJ tenderness, left 02/05/2017  . Anemia 08/30/2016  . Parotitis, acute 03/26/2016  . Coccygeal pain, acute 02/28/2016  . Rash 10/18/2015  . Urine frequency 05/30/2015  . Lower back pain  05/30/2015  . Allergic conjunctivitis 03/02/2015  . Bursitis of right shoulder 05/09/2014  . Acute bronchitis 03/23/2014  . Seasonal allergic conjunctivitis 01/01/2014  . Food allergy 01/01/2014  . Conjunctivitis 09/12/2013  . Seasonal and perennial allergic rhinitis 09/12/2013  . Abdominal pain, other specified site 03/18/2013  . GERD (gastroesophageal reflux disease) 10/17/2012  . Preventative health care 04/07/2012  . POLYP, GALLBLADDER 11/30/2010  . SHOULDER PAIN, RIGHT 11/30/2010  . CERVICAL RADICULOPATHY, RIGHT 11/30/2010  . DEPRESSION 03/26/2010  . Headache(784.0) 03/26/2010  . SYNCOPE 06/07/2009  . Diabetes (HCC) 10/29/2007  . Hyperlipidemia 10/29/2007  . Anxiety state 10/29/2007  . COMMON MIGRAINE 10/29/2007  . Essential hypertension 10/29/2007  . BACK PAIN 10/29/2007    Sharmon Leyden, MPT 02/06/2018, 2:17 PM  [facility] [phone]  [place] [address] [address] [place], [place], [address] Phone: [phone]   Fax:  [phone]  Name: FARRYN LINARES MRN: [id number] Date of Birth: March 07, 1956

## 2018-02-12 ENCOUNTER — Encounter: Payer: BLUE CROSS/BLUE SHIELD | Admitting: Physical Therapy

## 2018-02-12 ENCOUNTER — Telehealth: Payer: Self-pay | Admitting: Physical Therapy

## 2018-02-12 NOTE — Telephone Encounter (Signed)
Patient missed physical therapy appointment.  Called patient and spoke to her; she stated she forgot appointment.  Confirmed next 2 appointments with her; she stated she would write them on her calendar.   Mayer Camel, PTA 02/12/18 9:11 AM

## 2018-02-16 ENCOUNTER — Ambulatory Visit (INDEPENDENT_AMBULATORY_CARE_PROVIDER_SITE_OTHER): Payer: BLUE CROSS/BLUE SHIELD | Admitting: Physical Therapy

## 2018-02-16 DIAGNOSIS — M6281 Muscle weakness (generalized): Secondary | ICD-10-CM

## 2018-02-16 DIAGNOSIS — M25611 Stiffness of right shoulder, not elsewhere classified: Secondary | ICD-10-CM

## 2018-02-16 DIAGNOSIS — M25511 Pain in right shoulder: Secondary | ICD-10-CM

## 2018-02-16 NOTE — Patient Instructions (Signed)
  Resisted External Rotation: in Neutral - Bilateral  PALMS UP!!! Sit or stand, tubing in both hands, elbows at sides, bent to 90, forearms forward. Pinch shoulder blades together and rotate forearms out. Keep elbows at sides. Repeat __10__ times per set. Do __2-3__ sets per session. Do __3-4__ sessions per week.  YELLOW BAND  Resistive Band Rowing   With resistive band anchored in door, grasp both ends. Keeping elbows bent, pull back, squeezing shoulder blades together. Hold _3-5___ seconds. Repeat _10___ times. Do __1__ sessions per day.  SHOULDER: Abduction (Weight)    Raise arm out and up. Keep elbow straight. Do not shrug shoulders- bring shoulder blades back. Use _1__ lb weight. _10__ reps per set, _1-2__ sets per day, _3__ days per week  Scapula Adduction With Pectorals, Low   Stand in doorframe with palms against frame and arms at 45. Lean forward and squeeze shoulder blades. Hold _15-30__ seconds. Repeat _2_ times per session. Do _2__ sessions per day.  Scapula Adduction With Pectorals, Mid-Range   Stand in doorframe with palms against frame and arms at 90. Lean forward and squeeze shoulder blades. Hold _15-30__ seconds. Repeat _2__ times per session. Do _2__ sessions per day.  Elbow Press    Interlace fingers; bring hands underneath head. Press elbows down. Hold _10__ seconds. Relax arms. Repeat _3__ times.  *Self massage with ball to pec and back of shoulder  [facility] [address] [place], [place] [address]  [phone] (office) [phone] (fax)

## 2018-02-16 NOTE — Therapy (Signed)
[place], [place], [address] Phone: [phone]   Fax:  [phone]  Physical Therapy Treatment  Patient Details  Name: Ruth Crawford MRN: [id number] Date of Birth: [birth year] Referring Provider: Hulan Saas, MD   Encounter Date: 02/16/2018  PT End of Session - 02/16/18 0846    Visit Number  2    Number of Visits  12    PT Start Time  0800    PT Stop Time  0846    PT Time Calculation (min)  46 min       Past Medical History:  Diagnosis Date  . Allergy   . ANXIETY   . COMMON MIGRAINE   . DEPRESSION   . DIABETES MELLITUS, TYPE II   . GERD (gastroesophageal reflux disease) 10/17/2012  . HYPERLIPIDEMIA   . HYPERTENSION   . POLYP, GALLBLADDER   . Sickle cell trait [facility])     Past Surgical History:  Procedure Laterality Date  . ABDOMINAL HYSTERECTOMY  2000  . COLONOSCOPY    . [place], 2016, 2017   s/p    There were no vitals filed for this visit.  Subjective Assessment - 02/16/18 0805    Subjective  Pt reports she missed therapy last week due to a stomach bug.  She completed most exercises 1x /day despite illness.  "This morning has been rough", reporting increased stiffness.     Patient Stated Goals  Stop hurting, get back to my daily activites    Currently in Pain?  Yes    Pain Score  7     Pain Orientation  Right    Pain Descriptors / Indicators  Aching;Sharp    Aggravating Factors   moving arm    Pain Relieving Factors  resting          [facility] PT Assessment - 02/16/18 0001      Assessment   Medical Diagnosis  R shoulder pain    Referring Provider  Hulan Saas, MD    Onset Date/Surgical Date  12/07/17    Hand Dominance  Right    Next MD Visit  03/02/18      AROM   Right Shoulder External Rotation  90 Degrees    Left Shoulder External Rotation  87 Degrees        [facility] Adult PT Treatment/Exercise - 02/16/18 0001      Self-Care   Self-Care  Other  Self-Care Comments;Heat/Ice Application;Posture    Posture  Pt educated on importance of posture and effects on shoulder motion; shoulder model presented and tactile cues provided for pt.     Heat/Ice Application  Pt educated on rationale and parameters for ice/heat application.  Pt verbalized understanding    Other Self-Care Comments   Pt educated on self massage with ball to Rt pec and periscapular musculature to decrease fascial tightness and pain.  Pt returned demo with VC.       Shoulder Exercises: Supine   Other Supine Exercises  snow angels bilat x 8 reps, to tolerance (pt reported LUE tighter than Rt)    Other Supine Exercises  elbow press x 10 sec x 4 reps       Shoulder Exercises: Standing   External Rotation  Strengthening;Both;10 reps;Theraband    Theraband Level (Shoulder External Rotation)  Level 1 (Yellow);Level 2 (Red) redx 10, yellow x 10    ABduction  Strengthening;Both;10 reps;Weights 2 sets, mirror for feedback.    Shoulder ABduction  Weight (lbs)  1    Row  Strengthening;Both;15 reps;Theraband;Limitations    Theraband Level (Shoulder Row)  Level 2 (Red)    Row Limitations  Pt reported increased Rt anterior shoulder discomfort at end of this exercise.       Shoulder Exercises: ROM/Strengthening   UBE (Upper Arm Bike)  L1: 1 min each direction, standing      Shoulder Exercises: Isometric Strengthening   Flexion  5X5"    Extension  5X5"    External Rotation  5X5"    Internal Rotation  5X5"      Shoulder Exercises: Stretch   Other Shoulder Stretches  low and midlevel doorway stretch x 20 sec x 2 reps each position    Other Shoulder Stretches  shoulder ext stretch with towel assist (behind back) x 20 x 3 reps               PT Short Term Goals - 02/16/18 0854      PT SHORT TERM GOAL #1   Title  Pt will be independent in her HEP.     Time  2    Period  Weeks    Status  On-going        PT Long Term Goals - 02/16/18 0815      PT LONG TERM GOAL #1    Title  Pt will improve her FOTO score fro 44% limitation to </=33% limitation.     Time  6    Period  Weeks    Status  On-going      PT LONG TERM GOAL #2   Title  Pt will improve her R shoulder external rotation to >/= 70 degrees.     Time  6    Period  Weeks    Status  Achieved      PT LONG TERM GOAL #3   Title  Pt will report pain </= 2/10 with ADL's.     Time  6    Period  Weeks    Status  On-going      PT LONG TERM GOAL #4   Title  Pt will improve her R shoulder strength to >/= 4/5 in order to improve functional mobility.     Time  6    Period  Weeks    Status  On-going            Plan - 02/16/18 0850    Clinical Impression Statement  Pt demonstrated improved Rt shoulder ER; has met LTG#2.  She reported some increased discomfort with row exercise; reduced with rest.  She tolerated all other exercises well, reporting 5 point reduciton of pain at end of session. She required minor cues for improved scapular positioning with exercise. Pt declined modalities, will use heating pad at home.     Rehab Potential  Good    PT Frequency  2x / week    PT Duration  6 weeks    PT Treatment/Interventions  ADLs/Self Care Home Management;Electrical Stimulation;Cryotherapy;Iontophoresis 68m/ml Dexamethasone;Moist Heat;Ultrasound;Therapeutic exercise;Therapeutic activities;Functional mobility training;Patient/family education;Passive range of motion;Manual techniques;Dry needling;Taping;Vasopneumatic Device    PT Next Visit Plan  assess response to new HEP.  continue progressive strengthening    Consulted and Agree with Plan of Care  Patient       Patient will benefit from skilled therapeutic intervention in order to improve the following deficits and impairments:  Pain, Postural dysfunction, Impaired UE functional use, Decreased strength, Decreased range of motion  Visit Diagnosis: Acute pain of right  shoulder  Muscle weakness (generalized)  Stiffness of right shoulder, not  elsewhere classified     Problem List Patient Active Problem List   Diagnosis Date Noted  . Right rotator cuff tear 01/05/2018  . Right cervical radiculopathy 12/12/2017  . Acute shoulder bursitis, right 12/12/2017  . Left-sided face pain 03/26/2017  . TMJ tenderness, left 02/05/2017  . Anemia 08/30/2016  . Parotitis, acute 03/26/2016  . Coccygeal pain, acute 02/28/2016  . Rash 10/18/2015  . Urine frequency 05/30/2015  . Lower back pain 05/30/2015  . Allergic conjunctivitis 03/02/2015  . Bursitis of right shoulder 05/09/2014  . Acute bronchitis 03/23/2014  . Seasonal allergic conjunctivitis 01/01/2014  . Food allergy 01/01/2014  . Conjunctivitis 09/12/2013  . Seasonal and perennial allergic rhinitis 09/12/2013  . Abdominal pain, other specified site 03/18/2013  . GERD (gastroesophageal reflux disease) 10/17/2012  . Preventative health care 04/07/2012  . POLYP, GALLBLADDER 11/30/2010  . SHOULDER PAIN, RIGHT 11/30/2010  . CERVICAL RADICULOPATHY, RIGHT 11/30/2010  . DEPRESSION 03/26/2010  . Headache(784.0) 03/26/2010  . SYNCOPE 06/07/2009  . Diabetes ([place]) 10/29/2007  . Hyperlipidemia 10/29/2007  . Anxiety state 10/29/2007  . COMMON MIGRAINE 10/29/2007  . Essential hypertension 10/29/2007  . BACK PAIN 10/29/2007   Kerin Perna, PTA 02/16/18 8:57 AM  [facility] [place], [place], [address] Phone: [phone]   Fax:  [phone]  Name: Ruth Crawford MRN: [id number] Date of Birth: [birth year]

## 2018-02-18 ENCOUNTER — Ambulatory Visit (INDEPENDENT_AMBULATORY_CARE_PROVIDER_SITE_OTHER): Payer: BLUE CROSS/BLUE SHIELD | Admitting: Physical Therapy

## 2018-02-18 DIAGNOSIS — M25511 Pain in right shoulder: Secondary | ICD-10-CM

## 2018-02-18 DIAGNOSIS — M25611 Stiffness of right shoulder, not elsewhere classified: Secondary | ICD-10-CM | POA: Diagnosis not present

## 2018-02-18 DIAGNOSIS — M6281 Muscle weakness (generalized): Secondary | ICD-10-CM | POA: Diagnosis not present

## 2018-02-18 NOTE — Patient Instructions (Addendum)
  Scapular Retraction: Elbow Flexion (Standing)  "W's"     With elbows bent to 90, pinch shoulder blades together and rotate arms out, keeping elbows bent. Repeat __10__ times per set. Do __1-2__ sets per session. Do _several ___ sessions per day.   (this is a stick up)   [facility] [address] [place], [place] [address]  [phone] (office) [phone] (fax)

## 2018-02-18 NOTE — Therapy (Signed)
[facility] [place] [address] [place], [place], [address] Phone: [phone]   Fax:  [phone]  Physical Therapy Treatment  Patient Details  Name: Ruth Crawford MRN: [id number] Date of Birth: 1956-08-20 Referring Provider: Antoine Primas, MD   Encounter Date: 02/18/2018  PT End of Session - 02/18/18 1602    Visit Number  3    Number of Visits  12    PT Start Time  1523 Pt arrived late    PT Stop Time  1601    PT Time Calculation (min)  38 min       Past Medical History:  Diagnosis Date  . Allergy   . ANXIETY   . COMMON MIGRAINE   . DEPRESSION   . DIABETES MELLITUS, TYPE II   . GERD (gastroesophageal reflux disease) 10/17/2012  . HYPERLIPIDEMIA   . HYPERTENSION   . POLYP, GALLBLADDER   . Sickle cell trait [facility])     Past Surgical History:  Procedure Laterality Date  . ABDOMINAL HYSTERECTOMY  2000  . COLONOSCOPY    . LUMBAR DISC SURGERY  1998, 2016, 2017   s/p    There were no vitals filed for this visit.  Subjective Assessment - 02/18/18 1526    Subjective  Pt brought exercise sheet from MD appt (upper trap and levator stretch, chin tuck, scap retraction, IR/ER with red band, scaption with wts).  She did exercises 2x yesterday.   Woke up very stiff.  Overall things are improving.    Patient Stated Goals  Stop hurting, get back to my daily activites    Currently in Pain?  Yes    Pain Score  4     Pain Location  Shoulder    Pain Orientation  Right    Pain Descriptors / Indicators  Aching    Aggravating Factors   first thing in AM.     Pain Relieving Factors  resting          [facility] PT Assessment - 02/18/18 0001      Assessment   Medical Diagnosis  R shoulder pain    Referring Provider  Antoine Primas, MD    Onset Date/Surgical Date  12/07/17    Hand Dominance  Right    Next MD Visit  03/02/18      [facility] Adult PT Treatment/Exercise - 02/18/18 0001      Shoulder Exercises: Supine   Other Supine Exercises   snow angels bilat x 8 reps, while hooklying on green noodle;   scap retraction with chin tuck x 5 sec x 10 reps     Other Supine Exercises  elbow press x 10 sec x 4 reps       Shoulder Exercises: Standing   External Rotation  Strengthening;Both;10 reps;Theraband    Theraband Level (Shoulder External Rotation)  Level 1 (Yellow) 2 sets, some discomfort reported in [employer]  Strengthening;Both;Theraband;Limitations;10 reps    Theraband Level (Shoulder Row)  Level 1 (Yellow)    Other Standing Exercises  W's with chin tucks x 5 sec hold x 10 reps.      Other Standing Exercises  1# lift waist to eyelevel shelf with RUE x 10 reps in flexion, 10 reps in scaption.      Shoulder Exercises: ROM/Strengthening   UBE (Upper Arm Bike)  L1: 1.25 min each direction, standing      Shoulder Exercises: Stretch   Other Shoulder Stretches  low and mid level doorway stretch  x 20 sec each x 3 reps    Other Shoulder Stretches  shoulder ext stretch with towel assist (behind back) x 10-15sec x 3 reps               PT Short Term Goals - 02/16/18 0854      PT SHORT TERM GOAL #1   Title  Pt will be independent in her HEP.     Time  2    Period  Weeks    Status  On-going        PT Long Term Goals - 02/16/18 0815      PT LONG TERM GOAL #1   Title  Pt will improve her FOTO score fro 44% limitation to </=33% limitation.     Time  6    Period  Weeks    Status  On-going      PT LONG TERM GOAL #2   Title  Pt will improve her R shoulder external rotation to >/= 70 degrees.     Time  6    Period  Weeks    Status  Achieved      PT LONG TERM GOAL #3   Title  Pt will report pain </= 2/10 with ADL's.     Time  6    Period  Weeks    Status  On-going      PT LONG TERM GOAL #4   Title  Pt will improve her R shoulder strength to >/= 4/5 in order to improve functional mobility.     Time  6    Period  Weeks    Status  On-going            Plan - 02/18/18 1700    Clinical Impression  Statement  Pt has had a positive response to change in HEP (decreased to yellow band) and self massage with ball.  Overall pain rating was down compared to last session. She reported reduced pain at end of session, with completion of stretches and exercises.  Progressing towards goals.     Rehab Potential  Good    PT Frequency  2x / week    PT Duration  6 weeks    PT Treatment/Interventions  ADLs/Self Care Home Management;Electrical Stimulation;Cryotherapy;Iontophoresis 4mg /ml Dexamethasone;Moist Heat;Ultrasound;Therapeutic exercise;Therapeutic activities;Functional mobility training;Patient/family education;Passive range of motion;Manual techniques;Dry needling;Taping;Vasopneumatic Device    PT Next Visit Plan   continue progressive strengthening for Rt shoulder.     Consulted and Agree with Plan of Care  Patient       Patient will benefit from skilled therapeutic intervention in order to improve the following deficits and impairments:  Pain, Postural dysfunction, Impaired UE functional use, Decreased strength, Decreased range of motion  Visit Diagnosis: Acute pain of right shoulder  Muscle weakness (generalized)  Stiffness of right shoulder, not elsewhere classified     Problem List Patient Active Problem List   Diagnosis Date Noted  . Right rotator cuff tear 01/05/2018  . Right cervical radiculopathy 12/12/2017  . Acute shoulder bursitis, right 12/12/2017  . Left-sided face pain 03/26/2017  . TMJ tenderness, left 02/05/2017  . Anemia 08/30/2016  . Parotitis, acute 03/26/2016  . Coccygeal pain, acute 02/28/2016  . Rash 10/18/2015  . Urine frequency 05/30/2015  . Lower back pain 05/30/2015  . Allergic conjunctivitis 03/02/2015  . Bursitis of right shoulder 05/09/2014  . Acute bronchitis 03/23/2014  . Seasonal allergic conjunctivitis 01/01/2014  . Food allergy 01/01/2014  . Conjunctivitis 09/12/2013  . Seasonal and perennial  allergic rhinitis 09/12/2013  . Abdominal pain,  other specified site 03/18/2013  . GERD (gastroesophageal reflux disease) 10/17/2012  . Preventative health care 04/07/2012  . POLYP, GALLBLADDER 11/30/2010  . SHOULDER PAIN, RIGHT 11/30/2010  . CERVICAL RADICULOPATHY, RIGHT 11/30/2010  . DEPRESSION 03/26/2010  . Headache(784.0) 03/26/2010  . SYNCOPE 06/07/2009  . Diabetes (HCC) 10/29/2007  . Hyperlipidemia 10/29/2007  . Anxiety state 10/29/2007  . COMMON MIGRAINE 10/29/2007  . Essential hypertension 10/29/2007  . BACK PAIN 10/29/2007   Mayer CamelJennifer Carlson-Long, PTA 02/18/18 5:01 PM  [facility] [place] [address] [place], [place], [address] Phone: [phone]   Fax:  [phone]  Name: Ruth Crawford MRN: [id number] Date of Birth: 04/13/1956

## 2018-02-23 ENCOUNTER — Ambulatory Visit (INDEPENDENT_AMBULATORY_CARE_PROVIDER_SITE_OTHER): Payer: BLUE CROSS/BLUE SHIELD | Admitting: Physical Therapy

## 2018-02-23 DIAGNOSIS — M25611 Stiffness of right shoulder, not elsewhere classified: Secondary | ICD-10-CM

## 2018-02-23 DIAGNOSIS — M6281 Muscle weakness (generalized): Secondary | ICD-10-CM | POA: Diagnosis not present

## 2018-02-23 DIAGNOSIS — M25511 Pain in right shoulder: Secondary | ICD-10-CM | POA: Diagnosis not present

## 2018-02-23 NOTE — Therapy (Signed)
[facility] [place] [address] [place], [place], [address] Phone: [phone]   Fax:  [phone]  Physical Therapy Treatment  Patient Details  Name: Ruth Crawford MRN: [id number] Date of Birth: 08/16/1956 Referring Provider: Antoine PrimasZachary Smith, MD   Encounter Date: 02/23/2018  PT End of Session - 02/23/18 0810    Visit Number  4    Number of Visits  12    PT Start Time  0806 pt arrived late    PT Stop Time  0844    PT Time Calculation (min)  38 min       Past Medical History:  Diagnosis Date  . Allergy   . ANXIETY   . COMMON MIGRAINE   . DEPRESSION   . DIABETES MELLITUS, TYPE II   . GERD (gastroesophageal reflux disease) 10/17/2012  . HYPERLIPIDEMIA   . HYPERTENSION   . POLYP, GALLBLADDER   . Sickle cell trait [facility](HCC)     Past Surgical History:  Procedure Laterality Date  . ABDOMINAL HYSTERECTOMY  2000  . COLONOSCOPY    . LUMBAR DISC SURGERY  1998, 2016, 2017   s/p    There were no vitals filed for this visit.  Subjective Assessment - 02/23/18 0810    Subjective  Pt reports she woke up with increased pain in her Rt pec.  "I think the weather has a lot to do with it".      Patient Stated Goals  Stop hurting, get back to my daily activites    Currently in Pain?  Yes    Pain Score  4     Pain Location  Shoulder    Pain Orientation  Right    Pain Descriptors / Indicators  Tightness;Sore    Pain Type  Acute pain    Aggravating Factors   first thing in AM    Pain Relieving Factors  rest    Effect of Pain on Daily Activities  difficulty with household chores         [facility] - 02/23/18 0001      Assessment   Medical Diagnosis  R shoulder pain    Referring Provider  Antoine PrimasZachary Smith, MD    Onset Date/Surgical Date  12/07/17    Hand Dominance  Right    Next MD Visit  03/02/18      Strength   Strength Assessment Site  Shoulder    Right/Left Shoulder  Right    Right Shoulder Flexion  3+/5 with pain    Right Shoulder Extension  4-/5 with pain    Right Shoulder ABduction  4-/5 with pain    Right Shoulder Internal Rotation  4+/5    Right Shoulder External Rotation  4+/5       [facility] Adult PT Treatment/Exercise - 02/23/18 0001      Shoulder Exercises: Supine   Horizontal ABduction  Strengthening;Both;10 reps;Theraband    Theraband Level (Shoulder Horizontal ABduction)  Level 2 (Red)    External Rotation  Strengthening;Both;10 reps;Theraband    Theraband Level (Shoulder External Rotation)  Level 2 (Red)    Flexion  Strengthening;Both;10 reps;Theraband overhead pull    Theraband Level (Shoulder Flexion)  Level 2 (Red)    Other Supine Exercises  Sash with RUE x 10 reps, 2 sets, red band, snow angels to tolerance x 8 reps    Other Supine Exercises  elbow press x 10 sec x 4 reps       Shoulder Exercises: Stretch   Internal Rotation  Stretch  2 reps 15 sec, AAROM with LUE    Other Shoulder Stretches  Low and midlevel doorway stretch x 15 sec x 2 reps each (pt reported increased pain with mid-level stretch)     Other Shoulder Stretches  shoulder ext stretch with towel assist (behind back) x 10-15sec x 3 reps      Modalities   Modalities  -- pt declined, will use heating pad at home.       Manual Therapy   Manual Therapy  Soft tissue mobilization;Myofascial release    Manual therapy comments  STM to Rt pec     Myofascial Release  Rt pec release               PT Short Term Goals - 02/16/18 0854      PT SHORT TERM GOAL #1   Title  Pt will be independent in her HEP.     Time  2    Period  Weeks    Status  On-going        PT Long Term Goals - 02/16/18 0815      PT LONG TERM GOAL #1   Title  Pt will improve her FOTO score fro 44% limitation to </=33% limitation.     Time  6    Period  Weeks    Status  On-going      PT LONG TERM GOAL #2   Title  Pt will improve her R shoulder external rotation to >/= 70 degrees.     Time  6    Period  Weeks    Status  Achieved      PT  LONG TERM GOAL #3   Title  Pt will report pain </= 2/10 with ADL's.     Time  6    Period  Weeks    Status  On-going      PT LONG TERM GOAL #4   Title  Pt will improve her R shoulder strength to >/= 4/5 in order to improve functional mobility.     Time  6    Period  Weeks    Status  On-going            Plan - 02/23/18 1610    Clinical Impression Statement  Pt demonstrated improved strength in Rt shoulder IR and ER, however still weak and painful in ext, abd, and flexion.  She reported some increased pain in Rt shoulder with mid-level doorway stretch as well as supine overhead pull with red band.   Pt making gradual progress towards goals.     Rehab Potential  Good    PT Frequency  2x / week    PT Duration  6 weeks    PT Treatment/Interventions  ADLs/Self Care Home Management;Electrical Stimulation;Cryotherapy;Iontophoresis 4mg /ml Dexamethasone;Moist Heat;Ultrasound;Therapeutic exercise;Therapeutic activities;Functional mobility training;Patient/family education;Passive range of motion;Manual techniques;Dry needling;Taping;Vasopneumatic Device    PT Next Visit Plan   continue progressive strengthening for Rt shoulder.     Consulted and Agree with Plan of Care  Patient       Patient will benefit from skilled therapeutic intervention in order to improve the following deficits and impairments:  Pain, Postural dysfunction, Impaired UE functional use, Decreased strength, Decreased range of motion  Visit Diagnosis: Acute pain of right shoulder  Muscle weakness (generalized)  Stiffness of right shoulder, not elsewhere classified     Problem List Patient Active Problem List   Diagnosis Date Noted  . Right rotator cuff tear 01/05/2018  . Right  cervical radiculopathy 12/12/2017  . Acute shoulder bursitis, right 12/12/2017  . Left-sided face pain 03/26/2017  . TMJ tenderness, left 02/05/2017  . Anemia 08/30/2016  . Parotitis, acute 03/26/2016  . Coccygeal pain, acute  02/28/2016  . Rash 10/18/2015  . Urine frequency 05/30/2015  . Lower back pain 05/30/2015  . Allergic conjunctivitis 03/02/2015  . Bursitis of right shoulder 05/09/2014  . Acute bronchitis 03/23/2014  . Seasonal allergic conjunctivitis 01/01/2014  . Food allergy 01/01/2014  . Conjunctivitis 09/12/2013  . Seasonal and perennial allergic rhinitis 09/12/2013  . Abdominal pain, other specified site 03/18/2013  . GERD (gastroesophageal reflux disease) 10/17/2012  . Preventative health care 04/07/2012  . POLYP, GALLBLADDER 11/30/2010  . SHOULDER PAIN, RIGHT 11/30/2010  . CERVICAL RADICULOPATHY, RIGHT 11/30/2010  . DEPRESSION 03/26/2010  . Headache(784.0) 03/26/2010  . SYNCOPE 06/07/2009  . Diabetes (HCC) 10/29/2007  . Hyperlipidemia 10/29/2007  . Anxiety state 10/29/2007  . COMMON MIGRAINE 10/29/2007  . Essential hypertension 10/29/2007  . BACK PAIN 10/29/2007   Mayer Camel, PTA 02/23/18 8:47 AM  [facility] [place] [address] [place], [place], [address] Phone: [phone]   Fax:  [phone]  Name: Ruth Crawford MRN: [id number] Date of Birth: 1956-05-31

## 2018-02-25 ENCOUNTER — Ambulatory Visit (INDEPENDENT_AMBULATORY_CARE_PROVIDER_SITE_OTHER): Payer: BLUE CROSS/BLUE SHIELD | Admitting: Physical Therapy

## 2018-02-25 DIAGNOSIS — M25511 Pain in right shoulder: Secondary | ICD-10-CM | POA: Diagnosis not present

## 2018-02-25 DIAGNOSIS — M6281 Muscle weakness (generalized): Secondary | ICD-10-CM

## 2018-02-25 DIAGNOSIS — M25611 Stiffness of right shoulder, not elsewhere classified: Secondary | ICD-10-CM | POA: Diagnosis not present

## 2018-02-25 NOTE — Therapy (Signed)
[facility] [place] [address] [place], [place], [address] Phone: [phone]   Fax:  [phone]  Physical Therapy Treatment  Patient Details  Name: Ruth Crawford MRN: [id number] Date of Birth: 1956/09/04 Referring Provider: Antoine Primas, MD   Encounter Date: 02/25/2018  PT End of Session - 02/25/18 1606    Visit Number  5    Number of Visits  12    PT Start Time  1604    PT Stop Time  1645    PT Time Calculation (min)  41 min    Activity Tolerance  Patient tolerated treatment well    Behavior During Therapy  [facility] for tasks assessed/performed       Past Medical History:  Diagnosis Date  . Allergy   . ANXIETY   . COMMON MIGRAINE   . DEPRESSION   . DIABETES MELLITUS, TYPE II   . GERD (gastroesophageal reflux disease) 10/17/2012  . HYPERLIPIDEMIA   . HYPERTENSION   . POLYP, GALLBLADDER   . Sickle cell trait [facility])     Past Surgical History:  Procedure Laterality Date  . ABDOMINAL HYSTERECTOMY  2000  . COLONOSCOPY    . LUMBAR DISC SURGERY  1998, 2016, 2017   s/p    There were no vitals filed for this visit.  Subjective Assessment - 02/25/18 1606    Subjective  Pt reports she was very sore in her Rt pec Monday; used heat/ice and took antacid.  Pain resolved by next day. Pt reports 70% reduction of pain and 50% improvement in shoulder function since initiating therapy.     Patient Stated Goals  Stop hurting, get back to my daily activites    Currently in Pain?  No/denies    Pain Score  0-No pain    Pain Location  Shoulder    Pain Orientation  Right         [facility] PT Assessment - 02/25/18 0001      Assessment   Medical Diagnosis  R shoulder pain    Referring Provider  Antoine Primas, MD    Onset Date/Surgical Date  12/07/17    Hand Dominance  Right    Next MD Visit  03/02/18      Observation/Other Assessments   Focus on Therapeutic Outcomes (FOTO)   32% limited      AROM   Right Shoulder Extension  51  Degrees with pain    Right Shoulder Flexion  154 Degrees    Right Shoulder ABduction  150 Degrees    Right Shoulder Internal Rotation  -- thumb to T9    Left Shoulder Extension  60 Degrees    Left Shoulder Flexion  151 Degrees    Left Shoulder ABduction  145 Degrees    Left Shoulder Internal Rotation  -- Thumb to T7      Strength   Right Shoulder Flexion  4/5 with pain    Right Shoulder Extension  -- with pain, 5-/5    Right Shoulder ABduction  4/5    Right Shoulder Internal Rotation  4+/5    Right Shoulder External Rotation  4+/5        [facility] Adult PT Treatment/Exercise - 02/25/18 0001      Shoulder Exercises: Standing   External Rotation  Strengthening;Both;10 reps 2 sets    Theraband Level (Shoulder External Rotation)  Level 2 (Red)    Internal Rotation  Strengthening;Right;10 reps;Theraband    Theraband Level (Shoulder Internal Rotation)  Level 2 (Red)  Extension  Limitations;10 reps;AROM;Both    Theraband Level (Shoulder Extension)  --    Extension Limitations  increased soreness after completing.     Row  Strengthening;Right;5 reps;Theraband    Theraband Level (Shoulder Row)  Level 2 (Red) pt reported increased Rt ant shoulder pain; stopped    Other Standing Exercises  D1 ext with red band x 10 reps each arm.       Shoulder Exercises: ROM/Strengthening   UBE (Upper Arm Bike)  L1: 2 min each direction, standing      Shoulder Exercises: Stretch   Internal Rotation Stretch  2 reps 15 sec, AAROM with LUE    Other Shoulder Stretches  3 position doorway stretch x 20 sec x 4 reps each position    Other Shoulder Stretches  shoulder ext stretch with towel assist (behind back) x 10-15sec x 3 reps      Modalities   Modalities  -- pt declined, will use heating pad at home.              PT Education - 02/25/18 1651    Education provided  Yes    Education Details   progressed exercises to red band (was at yellow)    Person(s) Educated  Patient    Methods  Explanation     Comprehension  Verbalized understanding       PT Short Term Goals - 02/16/18 0854      PT SHORT TERM GOAL #1   Title  Pt will be independent in her HEP.     Time  2    Period  Weeks    Status  On-going        PT Long Term Goals - 02/25/18 1627      PT LONG TERM GOAL #1   Title  Pt will improve her FOTO score fro 44% limitation to </=33% limitation.     Time  6    Period  Weeks    Status  Achieved      PT LONG TERM GOAL #2   Title  Pt will improve her R shoulder active motion allowing patient to reach behind her back functionally without pain    Time  6    Period  Weeks    Status  Revised      PT LONG TERM GOAL #3   Title  Pt will report no pain with ADL's/functional activities/reaching/lifting/etc.     Time  6    Period  Weeks    Status  Revised      PT LONG TERM GOAL #4   Title  Pt will improve her R shoulder strength to >/= 5-/5 to 5/5 in order to improve functional mobility.     Time  6    Period  Weeks    Status  Revised        See updated goals     Plan - 02/25/18 1639    Clinical Impression Statement  Pt demonstrated improved Rt shoulder strength since last visit. Pt reporting 50% improvement in shoulder function.  She continues to be limited with tasks requiring IR and extension.  She had some mild increase in pain with those exercises today.  Spoke to supervising PT regarding pt's progress; goals advanced and revised.     Rehab Potential  Good    PT Frequency  2x / week    PT Duration  6 weeks    PT Treatment/Interventions  ADLs/Self Care Home Management;Electrical Stimulation;Cryotherapy;Iontophoresis 4mg /ml Dexamethasone;Moist Heat;Ultrasound;Therapeutic exercise;Therapeutic  activities;Functional mobility training;Patient/family education;Passive range of motion;Manual techniques;Dry needling;Taping;Vasopneumatic Device    PT Next Visit Plan   continue progressive strengthening for Rt shoulder.     Consulted and Agree with Plan of Care  Patient        Patient will benefit from skilled therapeutic intervention in order to improve the following deficits and impairments:  Pain, Postural dysfunction, Impaired UE functional use, Decreased strength, Decreased range of motion  Visit Diagnosis: Acute pain of right shoulder  Muscle weakness (generalized)  Stiffness of right shoulder, not elsewhere classified     Problem List Patient Active Problem List   Diagnosis Date Noted  . Right rotator cuff tear 01/05/2018  . Right cervical radiculopathy 12/12/2017  . Acute shoulder bursitis, right 12/12/2017  . Left-sided face pain 03/26/2017  . TMJ tenderness, left 02/05/2017  . Anemia 08/30/2016  . Parotitis, acute 03/26/2016  . Coccygeal pain, acute 02/28/2016  . Rash 10/18/2015  . Urine frequency 05/30/2015  . Lower back pain 05/30/2015  . Allergic conjunctivitis 03/02/2015  . Bursitis of right shoulder 05/09/2014  . Acute bronchitis 03/23/2014  . Seasonal allergic conjunctivitis 01/01/2014  . Food allergy 01/01/2014  . Conjunctivitis 09/12/2013  . Seasonal and perennial allergic rhinitis 09/12/2013  . Abdominal pain, other specified site 03/18/2013  . GERD (gastroesophageal reflux disease) 10/17/2012  . Preventative health care 04/07/2012  . POLYP, GALLBLADDER 11/30/2010  . SHOULDER PAIN, RIGHT 11/30/2010  . CERVICAL RADICULOPATHY, RIGHT 11/30/2010  . DEPRESSION 03/26/2010  . Headache(784.0) 03/26/2010  . SYNCOPE 06/07/2009  . Diabetes (HCC) 10/29/2007  . Hyperlipidemia 10/29/2007  . Anxiety state 10/29/2007  . COMMON MIGRAINE 10/29/2007  . Essential hypertension 10/29/2007  . BACK PAIN 10/29/2007   Mayer Camel, PTA 02/25/18 4:55 PM  [facility] [place] [address] [place], [place], [address] Phone: [phone]   Fax:  [phone]  Name: MAXYNE DEROCHER MRN: [id number] Date of Birth: 1955-12-14  Celyn P. Leonor Liv PT, MPH 02/25/18 4:55 PM

## 2018-03-01 NOTE — Progress Notes (Signed)
[name] [address] [place], [place] [address] Phone: [phone] Subjective:    I'm seeing this patient by the request  of:    CC: Shoulder pain follow-up  [id number]  Ruth Crawford is a 62 y.o. female coming in with complaint of right shoulder pain.  Found to have an anterior cyst as well as a rotator cuff tear.  Given injection in April.  States she is doing approximately 50% better.  Patient still has some discomfort with certain range of motion sometimes some mild radiation down the arm.    Past Medical History:  Diagnosis Date  . Allergy   . ANXIETY   . COMMON MIGRAINE   . DEPRESSION   . DIABETES MELLITUS, TYPE II   . GERD (gastroesophageal reflux disease) 10/17/2012  . HYPERLIPIDEMIA   . HYPERTENSION   . POLYP, GALLBLADDER   . Sickle cell trait [facility])    Past Surgical History:  Procedure Laterality Date  . ABDOMINAL HYSTERECTOMY  2000  . COLONOSCOPY    . LUMBAR DISC SURGERY  1998, 2016, 2017   s/p   Social History   Socioeconomic History  . Marital status: Married    Spouse name: Not on file  . Number of children: 1  . Years of education: Not on file  . Highest education level: Not on file  Occupational History  . Occupation: Teacher, music    Comment: at [employer]  Social Needs  . Financial resource strain: Not on file  . Food insecurity:    Worry: Not on file    Inability: Not on file  . Transportation needs:    Medical: Not on file    Non-medical: Not on file  Tobacco Use  . Smoking status: Never Smoker  . Smokeless tobacco: Never Used  Substance and Sexual Activity  . Alcohol use: Yes    Comment: occasional use  . Drug use: No  . Sexual activity: Not on file  Lifestyle  . Physical activity:    Days per week: Not on file    Minutes per session: Not on file  . Stress: Not on file  Relationships  . Social connections:    Talks on phone: Not on file    Gets together: Not on file    Attends  religious service: Not on file    Active member of club or organization: Not on file    Attends meetings of clubs or organizations: Not on file    Relationship status: Not on file  Other Topics Concern  . Not on file  Social History Narrative  . Not on file   Allergies  Allergen Reactions  . Demerol [Meperidine] Other (See Comments)    Per pt: unknown  . Prednisone Itching and Rash   Family History  Problem Relation Age of Onset  . Diabetes Mother   . Heart disease Father   . Stroke Other   . Alcohol abuse Other   . Arthritis Other   . Colon cancer Neg Hx   . Esophageal cancer Neg Hx   . Rectal cancer Neg Hx   . Stomach cancer Neg Hx      Past medical history, social, surgical and family history all reviewed in electronic medical record.  No pertanent information unless stated regarding to the chief complaint.   Review of Systems:Review of systems updated and as accurate as of 03/02/18  No headache, visual changes, nausea, vomiting, diarrhea, constipation, dizziness, abdominal pain, skin rash,  fevers, chills, night sweats, weight loss, swollen lymph nodes, body aches, joint swelling,  chest pain, shortness of breath, mood changes.  Positive muscle aches  Objective  Blood pressure 130/74, pulse 88, height 5\' 2"  (1.575 m), weight 123 lb (55.8 kg), SpO2 96 %. Systems examined below as of 03/02/18   General: No apparent distress alert and oriented x3 mood and affect normal, dressed appropriately.  HEENT: Pupils equal, extraocular movements intact  Respiratory: Patient's speak in full sentences and does not appear short of breath  Cardiovascular: No lower extremity edema, non tender, no erythema  Skin: Warm dry intact with no signs of infection or rash on extremities or on axial skeleton.  Abdomen: Soft nontender  Neuro: Cranial nerves II through XII are intact, neurovascularly intact in all extremities with 2+ DTRs and 2+ pulses.  Lymph: No lymphadenopathy of posterior or  anterior cervical chain or axillae bilaterally.  Gait normal with good balance and coordination.  MSK:  Non tender with full range of motion and good stability and symmetric strength and tone of  elbows, wrist, hip, knee and ankles bilaterally.  Shoulder: Right Inspection reveals no abnormalities, atrophy or asymmetry. Palpation is normal with no tenderness over AC joint or bicipital groove. ROM is full in all planes. Rotator cuff strength normal throughout. Mild impingement Speeds and Yergason's tests normal. Positive O'Brien's as well as positive crossover Normal scapular function observed. Mild painful arc No apprehension sign Contralateral shoulder unremarkable  Procedure: Real-time Ultrasound Guided Injection of right acromioclavicular joint Device: GE Logiq Q7 Ultrasound guided injection is preferred based studies that show increased duration, increased effect, greater accuracy, decreased procedural pain, increased response rate, and decreased cost with ultrasound guided versus blind injection.  Verbal informed consent obtained.  Time-out conducted.  Noted no overlying erythema, induration, or other signs of local infection.  Skin prepped in a sterile fashion.  Local anesthesia: Topical Ethyl chloride.  With sterile technique and under real time ultrasound guidance: With a 25-gauge half inch needle was injected with 0.5 cc of 0.5% Marcaine and 0.5 cc of Kenalog 40 mg/mL Completed without difficulty  Pain immediately resolved suggesting accurate placement of the medication.  Advised to call if fevers/chills, erythema, induration, drainage, or persistent bleeding.  Images permanently stored and available for review in the ultrasound unit.  Impression: Technically successful ultrasound guided injection.       Impression and Recommendations:     This case required medical decision making of moderate complexity.      Note: This dictation was prepared with Dragon dictation  along with smaller phrase technology. Any transcriptional errors that result from this process are unintentional.

## 2018-03-02 ENCOUNTER — Ambulatory Visit: Payer: Self-pay

## 2018-03-02 ENCOUNTER — Encounter: Payer: Self-pay | Admitting: Family Medicine

## 2018-03-02 ENCOUNTER — Ambulatory Visit (INDEPENDENT_AMBULATORY_CARE_PROVIDER_SITE_OTHER): Payer: BLUE CROSS/BLUE SHIELD | Admitting: Family Medicine

## 2018-03-02 VITALS — BP 130/74 | HR 88 | Ht 62.0 in | Wt 123.0 lb

## 2018-03-02 DIAGNOSIS — G8929 Other chronic pain: Secondary | ICD-10-CM | POA: Diagnosis not present

## 2018-03-02 DIAGNOSIS — M75111 Incomplete rotator cuff tear or rupture of right shoulder, not specified as traumatic: Secondary | ICD-10-CM | POA: Diagnosis not present

## 2018-03-02 DIAGNOSIS — M19019 Primary osteoarthritis, unspecified shoulder: Secondary | ICD-10-CM

## 2018-03-02 DIAGNOSIS — M25511 Pain in right shoulder: Secondary | ICD-10-CM | POA: Diagnosis not present

## 2018-03-02 DIAGNOSIS — M19011 Primary osteoarthritis, right shoulder: Secondary | ICD-10-CM | POA: Diagnosis not present

## 2018-03-02 HISTORY — DX: Primary osteoarthritis, unspecified shoulder: M19.019

## 2018-03-02 NOTE — Patient Instructions (Signed)
Good to see you  Ice is your friend  pennsaid pinkie amount topically 2 times daily as needed.   Injected Ac joint  Write me or call [phone] and if not better in 1 week we will do MRI

## 2018-03-02 NOTE — Assessment & Plan Note (Signed)
Patient did have a partial tearing with the anterior cyst there is a concern for possible labral pathology.  Patient will need advanced imaging if this does not seem to help.  Has failed all other conservative therapy including 6 weeks of rehabilitation.

## 2018-03-02 NOTE — Assessment & Plan Note (Signed)
Injected.  Making progress.  Discussed icing regimen and home exercises.  Discussed which activities of doing which wants to avoid.  I believe that this is likely the majority of patient's pain we will rule this out before any advanced  imaging.

## 2018-03-04 ENCOUNTER — Encounter: Payer: Self-pay | Admitting: Rehabilitative and Restorative Service Providers"

## 2018-03-04 ENCOUNTER — Ambulatory Visit (INDEPENDENT_AMBULATORY_CARE_PROVIDER_SITE_OTHER): Payer: BLUE CROSS/BLUE SHIELD | Admitting: Rehabilitative and Restorative Service Providers"

## 2018-03-04 DIAGNOSIS — M25611 Stiffness of right shoulder, not elsewhere classified: Secondary | ICD-10-CM

## 2018-03-04 DIAGNOSIS — M6281 Muscle weakness (generalized): Secondary | ICD-10-CM

## 2018-03-04 DIAGNOSIS — M25511 Pain in right shoulder: Secondary | ICD-10-CM

## 2018-03-04 NOTE — Patient Instructions (Addendum)
ELBOW: Biceps - Standing    Standing at counter, place one hand on counter elbow straight. step forward. Hold _30__ seconds. __3_ reps per set, _2-3__ sets per day   Knee Roll    Lying on back, with knees bent and feet flat on floor, arms outstretched to sides, slowly roll both knees to side, hold 20-30 seconds. Back to starting position, hold 5 seconds. Then to opposite side, hold 20-30 seconds. Return to starting position. Keep shoulders and arms in contact with floor.    Prolonged snow angle  - arms out to the side - stay 3-5 minutes    Lying on right side with ball in the armpit area 1-2 min    Trigger Point Dry Needling  . What is Trigger Point Dry Needling (DN)? o DN is a physical therapy technique used to treat muscle pain and dysfunction. Specifically, DN helps deactivate muscle trigger points (muscle knots).  o A thin filiform needle is used to penetrate the skin and stimulate the underlying trigger point. The goal is for a local twitch response (LTR) to occur and for the trigger point to relax. No medication of any kind is injected during the procedure.   . What Does Trigger Point Dry Needling Feel Like?  o The procedure feels different for each individual patient. Some patients report that they do not actually feel the needle enter the skin and overall the process is not painful. Very mild bleeding may occur. However, many patients feel a deep cramping in the muscle in which the needle was inserted. This is the local twitch response.   Marland Kitchen. How Will I feel after the treatment? o Soreness is normal, and the onset of soreness may not occur for a few hours. Typically this soreness does not last longer than two days.  o Bruising is uncommon, however; ice can be used to decrease any possible bruising.  o In rare cases feeling tired or nauseous after the treatment is normal. In addition, your symptoms may get worse before they get better, this period will typically not last longer  than 24 hours.   . What Can I do After My Treatment? o Increase your hydration by drinking more water for the next 24 hours. o You may place ice or heat on the areas treated that have become sore, however, do not use heat on inflamed or bruised areas. Heat often brings more relief post needling. o You can continue your regular activities, but vigorous activity is not recommended initially after the treatment for 24 hours. o DN is best combined with other physical therapy such as strengthening, stretching, and other therapies.

## 2018-03-04 NOTE — Therapy (Signed)
[facility] [address] [place], [place], [address] Phone: [phone]   Fax:  [phone]  Physical Therapy Treatment  Patient Details  Name: Ruth Crawford MRN: [id number] Date of Birth: 05/06/1956 Referring Provider: Antoine PrimasZachary Smith, MD   Encounter Date: 03/04/2018  PT End of Session - 03/04/18 1519    Visit Number  6    Number of Visits  12    PT Start Time  1519    PT Stop Time  1614    PT Time Calculation (min)  55 min    Activity Tolerance  Patient tolerated treatment well       Past Medical History:  Diagnosis Date  . Allergy   . ANXIETY   . COMMON MIGRAINE   . DEPRESSION   . DIABETES MELLITUS, TYPE II   . GERD (gastroesophageal reflux disease) 10/17/2012  . HYPERLIPIDEMIA   . HYPERTENSION   . POLYP, GALLBLADDER   . Sickle cell trait [facility](H[facility])     Past Surgical History:  Procedure Laterality Date  . ABDOMINAL HYSTERECTOMY  2000  . COLONOSCOPY    . LUMBAR DISC SURGERY  1998, 2016, 2017   s/p    There were no vitals filed for this visit.  Subjective Assessment - 03/04/18 1524    Subjective  Patient was seen by MD Monday and received another shoulder injection. She thinks the injection helped some but she continues to have some discomfort in the front of Rt shoulder and arm     Currently in Pain?  No/denies    Pain Location  Shoulder    Pain Orientation  Right    Pain Descriptors / Indicators  Tightness;Sore    Pain Onset  More than a month ago    Pain Frequency  Intermittent                       [facility] Adult PT Treatment/Exercise - 03/04/18 0001      Shoulder Exercises: Stretch   Internal Rotation Stretch  2 reps 15 sec, AAROM with LUE    Other Shoulder Stretches  3 position doorway stretch x 20 sec x 4 reps each position    Other Shoulder Stretches  biceps stretch 30 sec x 2 reps; trunk rotation in snow angel position 20-30 sec hold each side       Manual Therapy   Manual therapy  comments  pt supine     Joint Mobilization  GH circumduction; inferior glides    Soft tissue mobilization  deep tissue work through the [employer] pecs; trap; teres pt supine and sidelying     Scapular Mobilization  Rt     Passive ROM  PROM Rt shoulder supine and Lt sidelying              PT Education - 03/04/18 1554    Education provided  Yes    Education Details  HEP DN     Person(s) Educated  Patient    Methods  Explanation;Demonstration;Tactile cues;Verbal cues;Handout    Comprehension  Verbalized understanding;Returned demonstration;Verbal cues required;Tactile cues required       PT Short Term Goals - 03/04/18 1611      PT SHORT TERM GOAL #1   Title  Pt will be independent in her HEP.     Time  2    Period  Weeks    Status  On-going        PT Long Term Goals - 03/04/18 1611  PT LONG TERM GOAL #1   Title  Pt will improve her FOTO score fro 44% limitation to </=33% limitation.     Time  6    Period  Weeks    Status  Achieved      PT LONG TERM GOAL #2   Title  Pt will improve her R shoulder active motion allowing patient to reach behind her back functionally without pain    Time  6    Period  Weeks    Status  On-going      PT LONG TERM GOAL #3   Title  Pt will report no pain with ADL's/functional activities/reaching/lifting/etc.     Time  6    Period  Weeks    Status  On-going      PT LONG TERM GOAL #4   Title  Pt will improve her R shoulder strength to >/= 5-/5 to 5/5 in order to improve functional mobility.     Time  6    Period  Weeks    Status  On-going            Plan - 03/04/18 1608    Clinical Impression Statement  Good gains in mobility and ROM following manual work. Note area of tightness through the Rt shoulder girdle - pecs; trap; teres. Increased ROM in extension and IR following treatment with report of improved ease of movement and less discomfort.     Rehab Potential  Good    PT Frequency  2x / week    PT Duration  6 weeks    PT  Treatment/Interventions  ADLs/Self Care Home Management;Electrical Stimulation;Cryotherapy;Iontophoresis 4mg /ml Dexamethasone;Moist Heat;Ultrasound;Therapeutic exercise;Therapeutic activities;Functional mobility training;Patient/family education;Passive range of motion;Manual techniques;Dry needling;Taping;Vasopneumatic Device    PT Next Visit Plan   continue progressive strengthening for Rt shoulder as indicated; focus on manual work with trial of DN for pecs and teres      Consulted and Agree with Plan of Care  Patient       Patient will benefit from skilled therapeutic intervention in order to improve the following deficits and impairments:  Pain, Postural dysfunction, Impaired UE functional use, Decreased strength, Decreased range of motion  Visit Diagnosis: Acute pain of right shoulder  Muscle weakness (generalized)  Stiffness of right shoulder, not elsewhere classified     Problem List Patient Active Problem List   Diagnosis Date Noted  . 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/02/2018  . Right rotator cuff tear 01/05/2018  . Right cervical radiculopathy 12/12/2017  . Acute shoulder bursitis, right 12/12/2017  . Left-sided face pain 03/26/2017  . TMJ tenderness, left 02/05/2017  . Anemia 08/30/2016  . Parotitis, acute 03/26/2016  . Coccygeal pain, acute 02/28/2016  . Rash 10/18/2015  . Urine frequency 05/30/2015  . Lower back pain 05/30/2015  . Allergic conjunctivitis 03/02/2015  . Bursitis of right shoulder 05/09/2014  . Acute bronchitis 03/23/2014  . Seasonal allergic conjunctivitis 01/01/2014  . Food allergy 01/01/2014  . Conjunctivitis 09/12/2013  . Seasonal and perennial allergic rhinitis 09/12/2013  . Abdominal pain, other specified site 03/18/2013  . GERD (gastroesophageal reflux disease) 10/17/2012  . Preventative health care 04/07/2012  . POLYP, GALLBLADDER 11/30/2010  . SHOULDER PAIN, RIGHT 11/30/2010  . CERVICAL RADICULOPATHY, RIGHT 11/30/2010  . DEPRESSION  03/26/2010  . Headache(784.0) 03/26/2010  . SYNCOPE 06/07/2009  . Diabetes (HCC) 10/29/2007  . Hyperlipidemia 10/29/2007  . Anxiety state 10/29/2007  . COMMON MIGRAINE 10/29/2007  . Essential hypertension 10/29/2007  . BACK PAIN 10/29/2007  Ruth Crawford Ruth Crawford PT, MPH  03/04/2018, 4:13 PM  [facility] [address] [place], [place], [address] Phone: [phone]   Fax:  [phone]  Name: Ruth Crawford MRN: [id number] Date of Birth: [birth year]

## 2018-03-11 ENCOUNTER — Encounter: Payer: BLUE CROSS/BLUE SHIELD | Admitting: Rehabilitative and Restorative Service Providers"

## 2018-03-13 ENCOUNTER — Ambulatory Visit (INDEPENDENT_AMBULATORY_CARE_PROVIDER_SITE_OTHER): Payer: BLUE CROSS/BLUE SHIELD | Admitting: Physical Therapy

## 2018-03-13 DIAGNOSIS — M25511 Pain in right shoulder: Secondary | ICD-10-CM | POA: Diagnosis not present

## 2018-03-13 DIAGNOSIS — M25611 Stiffness of right shoulder, not elsewhere classified: Secondary | ICD-10-CM | POA: Diagnosis not present

## 2018-03-13 DIAGNOSIS — M6281 Muscle weakness (generalized): Secondary | ICD-10-CM

## 2018-03-13 NOTE — Patient Instructions (Signed)

## 2018-03-13 NOTE — Therapy (Addendum)
[place], [place], [address] Phone: [phone]   Fax:  [phone]  Physical Therapy Treatment  Patient Details  Name: Ruth Crawford MRN: [id number] Date of Birth: 1956/03/06 Referring Provider: Hulan Saas, MD   Encounter Date: 03/13/2018  PT End of Session - 03/13/18 1544    Visit Number  7    Number of Visits  12    Date for PT Re-Evaluation  03/20/18    PT Start Time  1324 pt arrived late    PT Stop Time  1549    PT Time Calculation (min)  55 min       Past Medical History:  Diagnosis Date  . Allergy   . ANXIETY   . COMMON MIGRAINE   . DEPRESSION   . DIABETES MELLITUS, TYPE II   . GERD (gastroesophageal reflux disease) 10/17/2012  . HYPERLIPIDEMIA   . HYPERTENSION   . POLYP, GALLBLADDER   . Sickle cell trait [facility])     Past Surgical History:  Procedure Laterality Date  . ABDOMINAL HYSTERECTOMY  2000  . COLONOSCOPY    . [place], 2016, 2017   s/p    There were no vitals filed for this visit.  Subjective Assessment - 03/13/18 1504    Subjective  Pt has been painting the garage door today; is a little sore from that.  The deep tissue work the last session really helped.  She has been doing self massage with ball to back of shoulder and around armpit.     Currently in Pain?  No/denies    Pain Score  0-No pain         [facility] PT Assessment - 03/13/18 0001      Assessment   Medical Diagnosis  R shoulder pain    Referring Provider  Hulan Saas, MD    Onset Date/Surgical Date  12/07/17    Hand Dominance  Right    Next MD Visit  03/30/18      Strength   Right Shoulder Flexion  -- 5-/5    Right Shoulder Extension  5/5    Right Shoulder ABduction  -- 5-/5    Right Shoulder Internal Rotation  -- 5-/5    Right Shoulder External Rotation  -- 5-/5         [facility] Adult PT Treatment/Exercise - 03/13/18 0001      Shoulder Exercises: Standing   Extension   Strengthening;Both;10 reps;Theraband    Theraband Level (Shoulder Extension)  Level 2 (Red) 3 sec hold    Row  Both;10 reps;Theraband 3 sec hold in retraction     Theraband Level (Shoulder Row)  Level 3 (Green)      Shoulder Exercises: Stretch   Other Shoulder Stretches  3 position doorway stretch x 20 sec x 4 reps each position    Other Shoulder Stretches  biceps stretch 30 sec x 2 reps; trunk rotation in snow angel position 20-30 sec hold x 4 reps RUE.       Modalities   Modalities  Moist Heat;Electrical Stimulation      Moist Heat Therapy   Number Minutes Moist Heat  15 Minutes    Moist Heat Location  Shoulder Rt      Electrical Stimulation   Electrical Stimulation Location  Rt pec/ posterior shoulder    Electrical Stimulation Action  IFC    Electrical Stimulation Parameters  to tolerance    Electrical Stimulation Goals  Pain;Tone  Manual Therapy   Manual therapy comments  pt supine     Joint Mobilization  GH circumduction; inferior glides    Soft tissue mobilization  deep tissue work through the [employer]; trap; teres pt supine     Myofascial Release  pecs     Passive ROM  PROM Rt shoulder supine        Trigger Point Dry Needling - 03/13/18 1525    Muscles Treated Upper Body  -- Rt x 2 needles     Pectoralis Major Response  Twitch response elicited;Palpable increased muscle length    Pectoralis Minor Response  Twitch response elicited;Palpable increased muscle length           PT Education - 03/13/18 1525    Education provided  Yes    Education Details  DN    Person(s) Educated  Patient    Methods  Explanation    Comprehension  Verbalized understanding       PT Short Term Goals - 03/04/18 1611      PT SHORT TERM GOAL #1   Title  Pt will be independent in her HEP.     Time  2    Period  Weeks    Status  On-going        PT Long Term Goals - 03/04/18 1611      PT LONG TERM GOAL #1   Title  Pt will improve her FOTO score fro 44% limitation to </=33%  limitation.     Time  6    Period  Weeks    Status  Achieved      PT LONG TERM GOAL #2   Title  Pt will improve her R shoulder active motion allowing patient to reach behind her back functionally without pain    Time  6    Period  Weeks    Status  On-going      PT LONG TERM GOAL #3   Title  Pt will report no pain with ADL's/functional activities/reaching/lifting/etc.     Time  6    Period  Weeks    Status  On-going      PT LONG TERM GOAL #4   Title  Pt will improve her R shoulder strength to >/= 5-/5 to 5/5 in order to improve functional mobility.     Time  6    Period  Weeks    Status  On-going            Plan - 03/13/18 1502    Clinical Impression Statement  Tightness in teres maj/minor has decreased since last visit.  Pt sensitive to DN during session (provided by Gillermo Murdoch, PT).  Pt demonstrated improved Rt shoulder strength without pain, during MMT.  Pt will be working all next week and will be on vacation the following week.      Rehab Potential  Good    PT Frequency  2x / week    PT Duration  6 weeks    PT Treatment/Interventions  ADLs/Self Care Home Management;Electrical Stimulation;Cryotherapy;Iontophoresis 27m/ml Dexamethasone;Moist Heat;Ultrasound;Therapeutic exercise;Therapeutic activities;Functional mobility training;Patient/family education;Passive range of motion;Manual techniques;Dry needling;Taping;Vasopneumatic Device     PT Next Visit Plan  Pt to call and schedule when she returns from vacation. Assess response to DN.Assess need for additional sessions (end of POC)      Consulted and Agree with Plan of Care  Patient        Patient will benefit from skilled therapeutic intervention in order to improve the  following deficits and impairments:  Pain, Postural dysfunction, Impaired UE functional use, Decreased strength, Decreased range of motion  Visit Diagnosis: Acute pain of right shoulder  Muscle weakness (generalized)  Stiffness of right shoulder,  not elsewhere classified     Problem List Patient Active Problem List   Diagnosis Date Noted  . 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/02/2018  . Right rotator cuff tear 01/05/2018  . Right cervical radiculopathy 12/12/2017  . Acute shoulder bursitis, right 12/12/2017  . Left-sided face pain 03/26/2017  . TMJ tenderness, left 02/05/2017  . Anemia 08/30/2016  . Parotitis, acute 03/26/2016  . Coccygeal pain, acute 02/28/2016  . Rash 10/18/2015  . Urine frequency 05/30/2015  . Lower back pain 05/30/2015  . Allergic conjunctivitis 03/02/2015  . Bursitis of right shoulder 05/09/2014  . Acute bronchitis 03/23/2014  . Seasonal allergic conjunctivitis 01/01/2014  . Food allergy 01/01/2014  . Conjunctivitis 09/12/2013  . Seasonal and perennial allergic rhinitis 09/12/2013  . Abdominal pain, other specified site 03/18/2013  . GERD (gastroesophageal reflux disease) 10/17/2012  . Preventative health care 04/07/2012  . POLYP, GALLBLADDER 11/30/2010  . SHOULDER PAIN, RIGHT 11/30/2010  . CERVICAL RADICULOPATHY, RIGHT 11/30/2010  . DEPRESSION 03/26/2010  . Headache(784.0) 03/26/2010  . SYNCOPE 06/07/2009  . Diabetes ([place]) 10/29/2007  . Hyperlipidemia 10/29/2007  . Anxiety state 10/29/2007  . COMMON MIGRAINE 10/29/2007  . Essential hypertension 10/29/2007  . BACK PAIN 10/29/2007   Kerin Perna, PTA 03/13/18 3:48 PM   Celyn P. Helene Kelp PT, MPH 03/13/18 3:59 PM   [facility] [place], [place], [address] Phone: [phone]   Fax:  [phone]  Name: WALDINE ZENZ MRN: [id number] Date of Birth: 12/23/1955  PHYSICAL THERAPY DISCHARGE SUMMARY  Visits from Start of Care: 7  Current functional level related to goals / functional outcomes: Pt pleased with progress   Remaining deficits: none   Education / Equipment: Continue HEP Plan: Patient agrees to discharge.  Patient goals were met. Patient is  being discharged due to not returning since the last visit.  ?????

## 2018-03-29 NOTE — Progress Notes (Deleted)
[name] D.O. [facility] [address] [place], [place] [address] Phone: [phone] Subjective:    I'm seeing this patient by the request  of:    CC:   SEG:[id number]:Subjective  Ruth PlaterSandy L Crawford is a 62 y.o. female coming in with complaint of ***  Onset-  Location Duration-  Character- Aggravating factors- Reliving factors-  Therapies tried-  Severity-     Past Medical History:  Diagnosis Date  . Allergy   . ANXIETY   . COMMON MIGRAINE   . DEPRESSION   . DIABETES MELLITUS, TYPE II   . GERD (gastroesophageal reflux disease) 10/17/2012  . HYPERLIPIDEMIA   . HYPERTENSION   . POLYP, GALLBLADDER   . Sickle cell trait [facility](HCC)    Past Surgical History:  Procedure Laterality Date  . ABDOMINAL HYSTERECTOMY  2000  . COLONOSCOPY    . LUMBAR DISC SURGERY  1998, 2016, 2017   s/p   Social History   Socioeconomic History  . Marital status: Married    Spouse name: Not on file  . Number of children: 1  . Years of education: Not on file  . Highest education level: Not on file  Occupational History  . Occupation: Teacher, musicbilling clerk    Comment: at [employer]  Social Needs  . Financial resource strain: Not on file  . Food insecurity:    Worry: Not on file    Inability: Not on file  . Transportation needs:    Medical: Not on file    Non-medical: Not on file  Tobacco Use  . Smoking status: Never Smoker  . Smokeless tobacco: Never Used  Substance and Sexual Activity  . Alcohol use: Yes    Comment: occasional use  . Drug use: No  . Sexual activity: Not on file  Lifestyle  . Physical activity:    Days per week: Not on file    Minutes per session: Not on file  . Stress: Not on file  Relationships  . Social connections:    Talks on phone: Not on file    Gets together: Not on file    Attends religious service: Not on file    Active member of club or organization: Not on file    Attends meetings of clubs or organizations: Not on file    Relationship status:  Not on file  Other Topics Concern  . Not on file  Social History Narrative  . Not on file   Allergies  Allergen Reactions  . Demerol [Meperidine] Other (See Comments)    Per pt: unknown  . Prednisone Itching and Rash   Family History  Problem Relation Age of Onset  . Diabetes Mother   . Heart disease Father   . Stroke Other   . Alcohol abuse Other   . Arthritis Other   . Colon cancer Neg Hx   . Esophageal cancer Neg Hx   . Rectal cancer Neg Hx   . Stomach cancer Neg Hx      Past medical history, social, surgical and family history all reviewed in electronic medical record.  No pertanent information unless stated regarding to the chief complaint.   Review of Systems:Review of systems updated and as accurate as of 03/29/18  No headache, visual changes, nausea, vomiting, diarrhea, constipation, dizziness, abdominal pain, skin rash, fevers, chills, night sweats, weight loss, swollen lymph nodes, body aches, joint swelling, muscle aches, chest pain, shortness of breath, mood changes.   Objective  There were no vitals taken for  this visit. Systems examined below as of 03/29/18   General: No apparent distress alert and oriented x3 mood and affect normal, dressed appropriately.  HEENT: Pupils equal, extraocular movements intact  Respiratory: Patient's speak in full sentences and does not appear short of breath  Cardiovascular: No lower extremity edema, non tender, no erythema  Skin: Warm dry intact with no signs of infection or rash on extremities or on axial skeleton.  Abdomen: Soft nontender  Neuro: Cranial nerves II through XII are intact, neurovascularly intact in all extremities with 2+ DTRs and 2+ pulses.  Lymph: No lymphadenopathy of posterior or anterior cervical chain or axillae bilaterally.  Gait normal with good balance and coordination.  MSK:  Non tender with full range of motion and good stability and symmetric strength and tone of shoulders, elbows, wrist, hip, knee  and ankles bilaterally.     Impression and Recommendations:     This case required medical decision making of moderate complexity.      Note: This dictation was prepared with Dragon dictation along with smaller phrase technology. Any transcriptional errors that result from this process are unintentional.

## 2018-03-30 ENCOUNTER — Ambulatory Visit: Payer: BLUE CROSS/BLUE SHIELD | Admitting: Family Medicine

## 2018-04-08 ENCOUNTER — Telehealth: Payer: Self-pay | Admitting: Internal Medicine

## 2018-04-08 NOTE — Telephone Encounter (Signed)
Copied from CRM [phone]#[id number]. Topic: Quick Communication - See Telephone Encounter >> Apr 08, 2018 10:27 AM Burchel, Abbi R wrote: CRM for notification. See Telephone encounter for: 04/08/18.  Pt calling to resched appt w/Dr Katrinka BlazingSmith.  Pt is requesting appt on Monday or Friday.  Please call pt to advise.  Pt: [phone]

## 2018-04-08 NOTE — Telephone Encounter (Signed)
Called pt. Scheduled her 04/13/18 w/ Dr. Katrinka BlazingSmith.

## 2018-04-11 NOTE — Progress Notes (Signed)
[name] [address] [place], [place] [address] Phone: [phone] Subjective:     CC: Right shoulder pain follow up   EPP:IRJJOACZYS  Ruth Crawford is a 62 y.o. female coming in with complaint of right shoulder pain. She did have some relief for 2 weeks following injection. Pain is still constant.Does continue to have numbness and tingling in arm. Also complains for right calf muscle tightness over the weekend.  Follow-up   found to have a anterior cyst. Given injection in April with some moderate improvement in injecting the acromioclavicular joint 1 month ago.  Had 2 weeks of relief and now worsening pain again.  Seems to be deeper again.  Affecting daily activities.  Rates the severity pain is 7 out of 10.  Waking her up at night  Past Medical History:  Diagnosis Date  . Allergy   . ANXIETY   . COMMON MIGRAINE   . DEPRESSION   . DIABETES MELLITUS, TYPE II   . GERD (gastroesophageal reflux disease) 10/17/2012  . HYPERLIPIDEMIA   . HYPERTENSION   . POLYP, GALLBLADDER   . Sickle cell trait [facility])    Past Surgical History:  Procedure Laterality Date  . ABDOMINAL HYSTERECTOMY  2000  . COLONOSCOPY    . LUMBAR DISC SURGERY  1998, 2016, 2017   s/p   Social History   Socioeconomic History  . Marital status: Married    Spouse name: Not on file  . Number of children: 1  . Years of education: Not on file  . Highest education level: Not on file  Occupational History  . Occupation: Teacher, music    Comment: at [employer]  Social Needs  . Financial resource strain: Not on file  . Food insecurity:    Worry: Not on file    Inability: Not on file  . Transportation needs:    Medical: Not on file    Non-medical: Not on file  Tobacco Use  . Smoking status: Never Smoker  . Smokeless tobacco: Never Used  Substance and Sexual Activity  . Alcohol use: Yes    Comment: occasional use  . Drug use: No  . Sexual activity: Not on file    Lifestyle  . Physical activity:    Days per week: Not on file    Minutes per session: Not on file  . Stress: Not on file  Relationships  . Social connections:    Talks on phone: Not on file    Gets together: Not on file    Attends religious service: Not on file    Active member of club or organization: Not on file    Attends meetings of clubs or organizations: Not on file    Relationship status: Not on file  Other Topics Concern  . Not on file  Social History Narrative  . Not on file   Allergies  Allergen Reactions  . Demerol [Meperidine] Other (See Comments)    Per pt: unknown  . Prednisone Itching and Rash   Family History  Problem Relation Age of Onset  . Diabetes Mother   . Heart disease Father   . Stroke Other   . Alcohol abuse Other   . Arthritis Other   . Colon cancer Neg Hx   . Esophageal cancer Neg Hx   . Rectal cancer Neg Hx   . Stomach cancer Neg Hx      Past medical history, social, surgical and family history all reviewed in electronic  medical record.  No pertanent information unless stated regarding to the chief complaint.   Review of Systems:Review of systems updated and as accurate as of 04/13/18  No headache, visual changes, nausea, vomiting, diarrhea, constipation, dizziness, abdominal pain, skin rash, fevers, chills, night sweats, weight loss, swollen lymph nodes, body aches, joint swelling, muscle aches, chest pain, shortness of breath, mood changes.   Objective  Blood pressure (!) 148/88, pulse 98, height 5\' 2"  (1.575 m), weight 123 lb (55.8 kg), SpO2 96 %. Systems examined below as of 04/13/18   General: No apparent distress alert and oriented x3 mood and affect normal, dressed appropriately.  HEENT: Pupils equal, extraocular movements intact  Respiratory: Patient's speak in full sentences and does not appear short of breath  Cardiovascular: No lower extremity edema, non tender, no erythema  Skin: Warm dry intact with no signs of infection or  rash on extremities or on axial skeleton.  Abdomen: Soft nontender  Neuro: Cranial nerves II through XII are intact, neurovascularly intact in all extremities with 2+ DTRs and 2+ pulses.  Lymph: No lymphadenopathy of posterior or anterior cervical chain or axillae bilaterally.  Gait normal with good balance and coordination.  MSK:  Non tender with full range of motion and good stability and symmetric strength and tone of  elbows, wrist, hip, knee and ankles bilaterally.  Right shoulder show some decrease in range of motion from previous exam.  Positive impingement with Neer's and Hawkins.  Patient does have 4-5 strength of rotator cuff.  Anterior pain to palpation.   Impression and Recommendations:     This case required medical decision making of moderate complexity.      Note: This dictation was prepared with Dragon dictation along with smaller phrase technology. Any transcriptional errors that result from this process are unintentional.

## 2018-04-13 ENCOUNTER — Encounter: Payer: Self-pay | Admitting: Family Medicine

## 2018-04-13 ENCOUNTER — Ambulatory Visit (INDEPENDENT_AMBULATORY_CARE_PROVIDER_SITE_OTHER): Payer: BLUE CROSS/BLUE SHIELD | Admitting: Family Medicine

## 2018-04-13 ENCOUNTER — Ambulatory Visit (INDEPENDENT_AMBULATORY_CARE_PROVIDER_SITE_OTHER): Payer: BLUE CROSS/BLUE SHIELD | Admitting: Internal Medicine

## 2018-04-13 ENCOUNTER — Encounter: Payer: Self-pay | Admitting: Internal Medicine

## 2018-04-13 VITALS — BP 148/88 | HR 98 | Ht 62.0 in | Wt 123.0 lb

## 2018-04-13 VITALS — BP 142/88 | HR 89 | Temp 98.7°F | Ht 62.0 in | Wt 123.0 lb

## 2018-04-13 DIAGNOSIS — B351 Tinea unguium: Secondary | ICD-10-CM | POA: Diagnosis not present

## 2018-04-13 DIAGNOSIS — I10 Essential (primary) hypertension: Secondary | ICD-10-CM

## 2018-04-13 DIAGNOSIS — M75111 Incomplete rotator cuff tear or rupture of right shoulder, not specified as traumatic: Secondary | ICD-10-CM | POA: Diagnosis not present

## 2018-04-13 DIAGNOSIS — M25511 Pain in right shoulder: Secondary | ICD-10-CM

## 2018-04-13 DIAGNOSIS — G8929 Other chronic pain: Secondary | ICD-10-CM

## 2018-04-13 DIAGNOSIS — E119 Type 2 diabetes mellitus without complications: Secondary | ICD-10-CM

## 2018-04-13 DIAGNOSIS — L600 Ingrowing nail: Secondary | ICD-10-CM | POA: Diagnosis not present

## 2018-04-13 HISTORY — DX: Ingrowing nail: L60.0

## 2018-04-13 HISTORY — DX: Tinea unguium: B35.1

## 2018-04-13 MED ORDER — TERBINAFINE HCL 250 MG PO TABS: 250.0000 mg | ORAL_TABLET | Freq: Every day | ORAL | 2 refills | Status: DC

## 2018-04-13 NOTE — Assessment & Plan Note (Signed)
stable overall by history and exam, recent data reviewed with pt, and pt to continue medical treatment as before,  to f/u any worsening symptoms or concerns  

## 2018-04-13 NOTE — Assessment & Plan Note (Signed)
Ok for lamisil asd, with lft's at 6 wks

## 2018-04-13 NOTE — Progress Notes (Signed)
Subjective:    Patient ID: Ruth Crawford, female    DOB: 12/23/1955, 62 y.o.   MRN: [id number]  HPI  Here with concern about toe fungus starting, very mild for now but has a family member with severe nails and does not want to get that bad.  Ongoing for about 67mo, constant , not better or worse with anything.  No pain, except for medial aspect left great nail with several month intermittent pain with ingrown nail, worse with walking, better with not walking.  Pt denies chest pain, increased sob or doe, wheezing, orthopnea, PND, increased LE swelling, palpitations, dizziness or syncope.   Pt denies polydipsia, polyuria Denies worsening depressive symptoms, suicidal ideation, or panic; has More stress recently related to family. Not seeing podiatry, cont's to follow with endo for DM Past Medical History:  Diagnosis Date  . Allergy   . ANXIETY   . COMMON MIGRAINE   . DEPRESSION   . DIABETES MELLITUS, TYPE II   . GERD (gastroesophageal reflux disease) 10/17/2012  . HYPERLIPIDEMIA   . HYPERTENSION   . POLYP, GALLBLADDER   . Sickle cell trait [facility](HCC)    Past Surgical History:  Procedure Laterality Date  . ABDOMINAL HYSTERECTOMY  2000  . COLONOSCOPY    . LUMBAR DISC SURGERY  1998, 2016, 2017   s/p    reports that she has never smoked. She has never used smokeless tobacco. She reports that she drinks alcohol. She reports that she does not use drugs. family history includes Alcohol abuse in her other; Arthritis in her other; Diabetes in her mother; Heart disease in her father; Stroke in her other. Allergies  Allergen Reactions  . Demerol [Meperidine] Other (See Comments)    Per pt: unknown  . Prednisone Itching and Rash   Current Outpatient Medications on File Prior to Visit  Medication Sig Dispense Refill  . Aspirin-Acetaminophen-Caffeine (EXCEDRIN MIGRAINE PO) Take by mouth as needed.    Marland Kitchen. azelastine (OPTIVAR) 0.05 % ophthalmic solution INSTILL ONE DROP INTO EACH EYE TWICE DAILY 6 mL 12    . cyclobenzaprine (FLEXERIL) 5 MG tablet Take 1 tablet (5 mg total) by mouth 3 (three) times daily as needed for muscle spasms. 40 tablet 1  . Diclofenac Sodium (PENNSAID) 2 % SOLN Place 2 g onto the skin 2 (two) times daily. 112 g 3  . diphenhydrAMINE (BENADRYL) 25 MG tablet Take 25 mg by mouth daily as needed.    Marland Kitchen. FLUoxetine (PROZAC) 20 MG capsule TAKE ONE CAPSULE BY MOUTH ONCE DAILY 90 capsule 3  . GLIPIZIDE XL 5 MG 24 hr tablet TAKE ONE TABLET BY MOUTH TWICE DAILY 180 tablet 1  . glucose blood test strip 1 each by Other route as needed for other. RELION PRIME TEST STRIPS  Use as instructed    . linagliptin (TRADJENTA) 5 MG TABS tablet Take 1 tablet (5 mg total) by mouth daily. 90 tablet 3  . lisinopril (PRINIVIL,ZESTRIL) 10 MG tablet TAKE 1 TABLET BY MOUTH ONCE DAILY 90 tablet 1  . lovastatin (MEVACOR) 40 MG tablet TAKE ONE TABLET BY MOUTH ONCE DAILY 90 tablet 3  . meloxicam (MOBIC) 15 MG tablet Take 1 tablet (15 mg total) by mouth daily as needed for pain. 30 tablet 2  . metFORMIN (GLUCOPHAGE) 1000 MG tablet TAKE 1 TABLET BY MOUTH TWICE DAILY WITH MEALS 180 tablet 0  . phenylephrine (SUDAFED PE) 10 MG TABS tablet Take 10 mg by mouth daily.    Marland Kitchen. RELION LANCETS STANDARD 21G  MISC by Does not apply route.    . traMADol (ULTRAM) 50 MG tablet Take 1 tablet (50 mg total) by mouth every 6 (six) hours as needed. 30 tablet 1  . Vitamin D, Ergocalciferol, (DRISDOL) 50000 units CAPS capsule Take 1 capsule (50,000 Units total) by mouth every 7 (seven) days. 12 capsule 0   No current facility-administered medications on file prior to visit.    Review of Systems  Constitutional: Negative for other unusual diaphoresis or sweats HENT: Negative for ear discharge or swelling Eyes: Negative for other worsening visual disturbances Respiratory: Negative for stridor or other swelling  Gastrointestinal: Negative for worsening distension or other blood Genitourinary: Negative for retention or other urinary  change Musculoskeletal: Negative for other MSK pain or swelling Skin: Negative for color change or other new lesions Neurological: Negative for worsening tremors and other numbness  Psychiatric/Behavioral: Negative for worsening agitation or other fatigue All other system neg per pt    Objective:   Physical Exam BP (!) 142/88   Pulse 89   Temp 98.7 F (37.1 C) (Oral)   Ht 5\' 2"  (1.575 m)   Wt 123 lb (55.8 kg)   SpO2 98%   BMI 22.50 kg/m  VS noted,  Constitutional: Pt appears in NAD HENT: Head: NCAT.  Right Ear: External ear normal.  Left Ear: External ear normal.  Eyes: . Pupils are equal, round, and reactive to light. Conjunctivae and EOM are normal Nose: without d/c or deformity Neck: Neck supple. Gross normal ROM Cardiovascular: Normal rate and regular rhythm.   Pulmonary/Chest: Effort normal and breath sounds without rales or wheezing.  Abd:  Soft, NT, ND, + BS, no organomegaly Neurological: Pt is alert. At baseline orientation, motor grossly intact Skin: Skin is warm. No rashes, has bilat onychomycotic material to both great nails and left great toe medial mild ingrown nail with tenderness and slight swelling, no erythema or ulcer, no LE edema Psychiatric: Pt behavior is normal without agitation  No other exam findings Lab Results  Component Value Date   WBC 8.4 04/08/2017   HGB 11.5 (L) 04/08/2017   HCT 36.2 04/08/2017   PLT 443.0 (H) 04/08/2017   GLUCOSE 346 (H) 04/08/2017   CHOL 139 04/08/2017   TRIG 111.0 04/08/2017   HDL 59.70 04/08/2017   LDLCALC 57 04/08/2017   ALT 11 04/08/2017   AST 12 04/08/2017   NA 138 04/08/2017   K 3.9 04/08/2017   CL 100 04/08/2017   CREATININE 0.76 04/08/2017   BUN 11 04/08/2017   CO2 31 04/08/2017   TSH 0.69 04/08/2017   HGBA1C 8.2 (H) 04/08/2017   MICROALBUR 1.4 04/08/2017       Assessment & Plan:

## 2018-04-13 NOTE — Patient Instructions (Signed)
Good to see you  I am sorry not better  We will get a MRI of the shoulder and then discuss next step

## 2018-04-13 NOTE — Assessment & Plan Note (Signed)
Patient does have a partial-thickness tear does not seem to be improving with conservative therapy.  Failed all conservative therapy and has been greater than 12 weeks of conservative therapy at this time.  Did have an anterior cyst noted on ultrasound and concern for patient having a labral tear.  Patient would consider surgical intervention if needed.  MR arthrogram ordered today for further evaluation and treatment.  We discussed other treatment options and alternative treatments.  Patient is elected with the advanced imaging and will discuss further afterwards.  Spent  25 minutes with patient face-to-face and had greater than 50% of counseling including as described above in assessment and plan.

## 2018-04-13 NOTE — Assessment & Plan Note (Signed)
For podiatry asd,  to f/u any worsening symptoms or concerns

## 2018-04-13 NOTE — Patient Instructions (Signed)
Please take all new medication as prescribed - the lamisil for 12 wks  Please go to the LAB in the Basement (turn left off the elevator) for the tests to be done at 6 weeks  Please continue all other medications as before, and refills have been done if requested.  Please have the pharmacy call with any other refills you may need.  Please continue your efforts at being more active, low cholesterol diet, and weight control.  Please keep your appointments with your specialists as you may have planned  You will be contacted regarding the referral for: podiatry - for the left ingrown nail

## 2018-04-14 ENCOUNTER — Other Ambulatory Visit: Payer: Self-pay | Admitting: Internal Medicine

## 2018-04-14 ENCOUNTER — Ambulatory Visit: Payer: BLUE CROSS/BLUE SHIELD | Admitting: Family Medicine

## 2018-04-21 ENCOUNTER — Encounter: Payer: Self-pay | Admitting: Family Medicine

## 2018-04-22 ENCOUNTER — Other Ambulatory Visit: Payer: Self-pay | Admitting: Internal Medicine

## 2018-04-24 ENCOUNTER — Encounter: Payer: Self-pay | Admitting: Podiatry

## 2018-04-24 ENCOUNTER — Ambulatory Visit
Admission: RE | Admit: 2018-04-24 | Discharge: 2018-04-24 | Disposition: A | Discharge status: Home / Self Care | Payer: BLUE CROSS/BLUE SHIELD | Source: Ambulatory Visit | Attending: Family Medicine | Admitting: Family Medicine

## 2018-04-24 ENCOUNTER — Ambulatory Visit
Admission: RE | Admit: 2018-04-24 | Discharge: 2018-04-24 | Disposition: A | Discharge status: Home / Self Care | Payer: BLUE CROSS/BLUE SHIELD | Source: Ambulatory Visit | Attending: Internal Medicine | Admitting: Internal Medicine

## 2018-04-24 ENCOUNTER — Ambulatory Visit (INDEPENDENT_AMBULATORY_CARE_PROVIDER_SITE_OTHER): Payer: BLUE CROSS/BLUE SHIELD | Admitting: Podiatry

## 2018-04-24 DIAGNOSIS — G8929 Other chronic pain: Secondary | ICD-10-CM

## 2018-04-24 DIAGNOSIS — B351 Tinea unguium: Secondary | ICD-10-CM | POA: Diagnosis not present

## 2018-04-24 DIAGNOSIS — M25511 Pain in right shoulder: Secondary | ICD-10-CM | POA: Diagnosis not present

## 2018-04-24 DIAGNOSIS — E119 Type 2 diabetes mellitus without complications: Secondary | ICD-10-CM

## 2018-04-24 DIAGNOSIS — L600 Ingrowing nail: Secondary | ICD-10-CM | POA: Diagnosis not present

## 2018-04-24 DIAGNOSIS — M79609 Pain in unspecified limb: Principal | ICD-10-CM

## 2018-04-24 DIAGNOSIS — M79676 Pain in unspecified toe(s): Secondary | ICD-10-CM | POA: Diagnosis not present

## 2018-04-24 DIAGNOSIS — M5412 Radiculopathy, cervical region: Secondary | ICD-10-CM

## 2018-04-24 DIAGNOSIS — S46011A Strain of muscle(s) and tendon(s) of the rotator cuff of right shoulder, initial encounter: Secondary | ICD-10-CM | POA: Diagnosis not present

## 2018-04-24 DIAGNOSIS — M4802 Spinal stenosis, cervical region: Secondary | ICD-10-CM | POA: Diagnosis not present

## 2018-04-24 MED ORDER — IOPAMIDOL (ISOVUE-M 200) INJECTION 41%
12.0000 mL | Freq: Once | INTRAMUSCULAR | Status: AC
  Administered 2018-04-24: 12 mL via INTRA_ARTICULAR

## 2018-04-24 NOTE — Progress Notes (Addendum)
Subjective:  Patient ID: Ruth PlaterSandy L Crawford, female    DOB: 07/29/1956,  MRN: [id number]  Chief Complaint  Patient presents with  . Ingrown Toenail    left great toenail   62 y.o. female presents with the above complaint.  Reports ingrown toenail to the left great toe noticed by her PCP. Denies pain. Started taking lamisil recently for discoloration of the nail. Started 1 week ago. Last AMBS 230s.  Past Medical History:  Diagnosis Date  . Allergy   . ANXIETY   . COMMON MIGRAINE   . DEPRESSION   . DIABETES MELLITUS, TYPE II   . GERD (gastroesophageal reflux disease) 10/17/2012  . HYPERLIPIDEMIA   . HYPERTENSION   . POLYP, GALLBLADDER   . Sickle cell trait [facility](HCC)    Past Surgical History:  Procedure Laterality Date  . ABDOMINAL HYSTERECTOMY  2000  . COLONOSCOPY    . LUMBAR DISC SURGERY  1998, 2016, 2017   s/p    Current Outpatient Medications:  .  Aspirin-Acetaminophen-Caffeine (EXCEDRIN MIGRAINE PO), Take by mouth as needed., Disp: , Rfl:  .  azelastine (OPTIVAR) 0.05 % ophthalmic solution, INSTILL ONE DROP INTO EACH EYE TWICE DAILY, Disp: 6 mL, Rfl: 12 .  cyclobenzaprine (FLEXERIL) 5 MG tablet, Take 1 tablet (5 mg total) by mouth 3 (three) times daily as needed for muscle spasms., Disp: 40 tablet, Rfl: 1 .  Diclofenac Sodium (PENNSAID) 2 % SOLN, Place 2 g onto the skin 2 (two) times daily., Disp: 112 g, Rfl: 3 .  diphenhydrAMINE (BENADRYL) 25 MG tablet, Take 25 mg by mouth daily as needed., Disp: , Rfl:  .  FLUoxetine (PROZAC) 20 MG capsule, TAKE ONE CAPSULE BY MOUTH ONCE DAILY, Disp: 90 capsule, Rfl: 3 .  GLIPIZIDE XL 5 MG 24 hr tablet, TAKE ONE TABLET BY MOUTH TWICE DAILY, Disp: 180 tablet, Rfl: 1 .  glucose blood test strip, 1 each by Other route as needed for other. RELION PRIME TEST STRIPS  Use as instructed, Disp: , Rfl:  .  linagliptin (TRADJENTA) 5 MG TABS tablet, Take 1 tablet (5 mg total) by mouth daily., Disp: 90 tablet, Rfl: 3 .  lisinopril (PRINIVIL,ZESTRIL) 10 MG  tablet, TAKE 1 TABLET BY MOUTH ONCE DAILY, Disp: 90 tablet, Rfl: 1 .  lovastatin (MEVACOR) 40 MG tablet, TAKE 1 TABLET BY MOUTH ONCE DAILY, Disp: 90 tablet, Rfl: 3 .  meloxicam (MOBIC) 15 MG tablet, Take 1 tablet (15 mg total) by mouth daily as needed for pain., Disp: 30 tablet, Rfl: 2 .  metFORMIN (GLUCOPHAGE) 1000 MG tablet, TAKE 1 TABLET BY MOUTH TWICE DAILY WITH MEALS, Disp: 180 tablet, Rfl: 1 .  phenylephrine (SUDAFED PE) 10 MG TABS tablet, Take 10 mg by mouth daily., Disp: , Rfl:  .  RELION LANCETS STANDARD 21G MISC, by Does not apply route., Disp: , Rfl:  .  terbinafine (LAMISIL) 250 MG tablet, Take 1 tablet (250 mg total) by mouth daily., Disp: 30 tablet, Rfl: 2 .  traMADol (ULTRAM) 50 MG tablet, Take 1 tablet (50 mg total) by mouth every 6 (six) hours as needed., Disp: 30 tablet, Rfl: 1 .  Vitamin D, Ergocalciferol, (DRISDOL) 50000 units CAPS capsule, Take 1 capsule (50,000 Units total) by mouth every 7 (seven) days., Disp: 12 capsule, Rfl: 0  Allergies  Allergen Reactions  . Demerol [Meperidine] Other (See Comments)    Per pt: unknown  . Prednisone Itching and Rash   Review of Systems: Negative except as noted in the HPI. Denies N/V/F/Ch. Objective:  There were no vitals filed for this visit. General AA&O x3. Normal mood and affect.  Vascular Dorsalis pedis and posterior tibial pulses  present 2+ bilaterally  Capillary refill normal to all digits. Pedal hair growth normal.  Neurologic Epicritic sensation grossly present.  Dermatologic No open lesions. Interspaces clear of maceration. Bilateral hallux nails ingrowing at medial aspect, L hallux nail thickening and discoloration noted, brittle texture.  Orthopedic: MMT 5/5 in dorsiflexion, plantarflexion, inversion, and eversion. Normal joint ROM without pain or crepitus.   Assessment & Plan:  Patient was evaluated and treated and all questions answered.  Diabetes without complication., Onychomycosis -Educated on diabetic  footcare. Diabetic risk level 0 -Nails x2 debrided in slant back fashion to relief.  Onychomycosis -Discussed pharmacologic management with patient. Continue Lamisil. F/u with PCP for further med issues.  No follow-ups on file.

## 2018-04-25 ENCOUNTER — Other Ambulatory Visit: Payer: Self-pay | Admitting: Internal Medicine

## 2018-04-25 DIAGNOSIS — M5412 Radiculopathy, cervical region: Secondary | ICD-10-CM

## 2018-05-05 ENCOUNTER — Encounter: Payer: Self-pay | Admitting: Internal Medicine

## 2018-05-05 NOTE — Telephone Encounter (Signed)
Ian MalkinZach - hoping you can assist this pt question, thanks

## 2018-05-11 ENCOUNTER — Encounter: Payer: Self-pay | Admitting: Family Medicine

## 2018-05-11 DIAGNOSIS — N760 Acute vaginitis: Secondary | ICD-10-CM | POA: Diagnosis not present

## 2018-05-13 ENCOUNTER — Encounter: Payer: Self-pay | Admitting: Family Medicine

## 2018-05-17 ENCOUNTER — Other Ambulatory Visit: Payer: Self-pay | Admitting: Internal Medicine

## 2018-06-05 NOTE — Progress Notes (Signed)
Tawana ScaleZach Crawford D.O. [facility] [address] [place], [place] [address] Phone: [phone] Subjective:   Ruth Crawford, Ruth Crawford, am serving as a scribe for Dr. Antoine PrimasZachary Crawford.     CC: Shoulder pain  IHK:[id number]:Subjective  Ruth PlaterSandy L Crawford is a 62 y.o. female coming in with complaint of shoulder pain. Continued to have pain. Is using lidocaine patches which eases her pain. Does have some numbness going down her arm.  Patient was found to have some spinal stenosis.  MRI of the neck has shown significant progression at the disc degeneration at C5-C6 as well as C6-C7 with moderate spinal stenosis independently visualized by me.  Patient is having right thigh pain for 2 weeks. Had back surgery and she said that her leg felt like this before surgery. Has hard time with stairs.       Past Medical History:  Diagnosis Date  . Allergy   . ANXIETY   . COMMON MIGRAINE   . DEPRESSION   . DIABETES MELLITUS, TYPE II   . GERD (gastroesophageal reflux disease) 10/17/2012  . HYPERLIPIDEMIA   . HYPERTENSION   . POLYP, GALLBLADDER   . Sickle cell trait [facility](HCC)    Past Surgical History:  Procedure Laterality Date  . ABDOMINAL HYSTERECTOMY  2000  . COLONOSCOPY    . LUMBAR DISC SURGERY  1998, 2016, 2017   s/p   Social History   Socioeconomic History  . Marital status: Married    Spouse name: Not on file  . Number of children: 1  . Years of education: Not on file  . Highest education level: Not on file  Occupational History  . Occupation: Teacher, musicbilling clerk    Comment: at [employer]  Social Needs  . Financial resource strain: Not on file  . Food insecurity:    Worry: Not on file    Inability: Not on file  . Transportation needs:    Medical: Not on file    Non-medical: Not on file  Tobacco Use  . Smoking status: Never Smoker  . Smokeless tobacco: Never Used  Substance and Sexual Activity  . Alcohol use: Yes    Comment: occasional use  . Drug use: No  . Sexual activity: Not on file    Lifestyle  . Physical activity:    Days per week: Not on file    Minutes per session: Not on file  . Stress: Not on file  Relationships  . Social connections:    Talks on phone: Not on file    Gets together: Not on file    Attends religious service: Not on file    Active member of club or organization: Not on file    Attends meetings of clubs or organizations: Not on file    Relationship status: Not on file  Other Topics Concern  . Not on file  Social History Narrative  . Not on file   Allergies  Allergen Reactions  . Demerol [Meperidine] Other (See Comments)    Per pt: unknown  . Prednisone Itching and Rash   Family History  Problem Relation Age of Onset  . Diabetes Mother   . Heart disease Father   . Stroke Other   . Alcohol abuse Other   . Arthritis Other   . Colon cancer Neg Hx   . Esophageal cancer Neg Hx   . Rectal cancer Neg Hx   . Stomach cancer Neg Hx     Current Outpatient Medications (Endocrine & Metabolic):  .  GLIPIZIDE XL 5 MG 24 hr tablet, TAKE 1 TABLET BY MOUTH TWICE DAILY .  linagliptin (TRADJENTA) 5 MG TABS tablet, Take 1 tablet (5 mg total) by mouth daily. .  metFORMIN (GLUCOPHAGE) 1000 MG tablet, TAKE 1 TABLET BY MOUTH TWICE DAILY WITH MEALS  Current Outpatient Medications (Cardiovascular):  .  lisinopril (PRINIVIL,ZESTRIL) 10 MG tablet, TAKE 1 TABLET BY MOUTH ONCE DAILY .  lovastatin (MEVACOR) 40 MG tablet, TAKE 1 TABLET BY MOUTH ONCE DAILY  Current Outpatient Medications (Respiratory):  .  diphenhydrAMINE (BENADRYL) 25 MG tablet, Take 25 mg by mouth daily as needed. .  phenylephrine (SUDAFED PE) 10 MG TABS tablet, Take 10 mg by mouth daily.  Current Outpatient Medications (Analgesics):  Marland Kitchen  Aspirin-Acetaminophen-Caffeine (EXCEDRIN MIGRAINE PO), Take by mouth as needed. .  meloxicam (MOBIC) 15 MG tablet, Take 1 tablet (15 mg total) by mouth daily as needed for pain. .  traMADol (ULTRAM) 50 MG tablet, Take 1 tablet (50 mg total) by mouth every  6 (six) hours as needed.   Current Outpatient Medications (Other):  .  azelastine (OPTIVAR) 0.05 % ophthalmic solution, INSTILL ONE DROP INTO EACH EYE TWICE DAILY .  cyclobenzaprine (FLEXERIL) 5 MG tablet, Take 1 tablet (5 mg total) by mouth 3 (three) times daily as needed for muscle spasms. .  Diclofenac Sodium (PENNSAID) 2 % SOLN, Place 2 g onto the skin 2 (two) times daily. Marland Kitchen  FLUoxetine (PROZAC) 20 MG capsule, TAKE ONE CAPSULE BY MOUTH ONCE DAILY .  glucose blood test strip, 1 each by Other route as needed for other. RELION PRIME TEST STRIPS  Use as instructed .  RELION LANCETS STANDARD 21G MISC, by Does not apply route. .  terbinafine (LAMISIL) 250 MG tablet, Take 1 tablet (250 mg total) by mouth daily. .  Vitamin D, Ergocalciferol, (DRISDOL) 50000 units CAPS capsule, Take 1 capsule (50,000 Units total) by mouth every 7 (seven) days.    Past medical history, social, surgical and family history all reviewed in electronic medical record.  No pertanent information unless stated regarding to the chief complaint.   Review of Systems:  No headache, visual changes, nausea, vomiting, diarrhea, constipation, dizziness, abdominal pain, skin rash, fevers, chills, night sweats, weight loss, swollen lymph nodes, body aches, joint swelling, muscle aches, chest pain, shortness of breath, mood changes.   Objective  There were no vitals taken for this visit. Systems examined below as of    General: No apparent distress alert and oriented x3 mood and affect normal, dressed appropriately.  HEENT: Pupils equal, extraocular movements intact  Respiratory: Patient's speak in full sentences and does not appear short of breath  Cardiovascular: No lower extremity edema, non tender, no erythema  Skin: Warm dry intact with no signs of infection or rash on extremities or on axial skeleton.  Abdomen: Soft nontender  Neuro: Cranial nerves II through XII are intact, neurovascularly intact in all extremities with  2+ DTRs and 2+ pulses.  Lymph: No lymphadenopathy of posterior or anterior cervical chain or axillae bilaterally.  Gait normal with good balance and coordination.  MSK:  Non tender with full range of motion and good stability and symmetric strength and tone of shoulders, elbows, wrist, hip, knee and ankles bilaterally.  Neck: Inspection unremarkable. No palpable stepoffs. Positive Spurling's maneuver. Discussed with patient about loss of lordosis. Grip strength and sensation normal in bilateral hands Strength good C4 to T1 distribution No sensory change to C4 to T1 Negative Hoffman sign bilaterally Reflexes normal  Right shoulder  still shows 4-5 strength of the rotator cuff.  Positive impingement signs.  Positive crossover.     Impression and Recommendations:     This case required medical decision making of moderate complexity. The above documentation has been reviewed and is accurate and complete Judi Saa, DO       Note: This dictation was prepared with Dragon dictation along with smaller phrase technology. Any transcriptional errors that result from this process are unintentional.

## 2018-06-08 ENCOUNTER — Ambulatory Visit (INDEPENDENT_AMBULATORY_CARE_PROVIDER_SITE_OTHER): Payer: BLUE CROSS/BLUE SHIELD | Admitting: Family Medicine

## 2018-06-08 ENCOUNTER — Encounter: Payer: Self-pay | Admitting: Family Medicine

## 2018-06-08 ENCOUNTER — Ambulatory Visit (INDEPENDENT_AMBULATORY_CARE_PROVIDER_SITE_OTHER)
Admission: RE | Admit: 2018-06-08 | Discharge: 2018-06-08 | Disposition: A | Discharge status: Home / Self Care | Payer: BLUE CROSS/BLUE SHIELD | Source: Ambulatory Visit | Attending: Family Medicine | Admitting: Family Medicine

## 2018-06-08 VITALS — BP 132/98 | HR 87 | Ht 62.0 in | Wt 123.0 lb

## 2018-06-08 DIAGNOSIS — M75111 Incomplete rotator cuff tear or rupture of right shoulder, not specified as traumatic: Secondary | ICD-10-CM | POA: Diagnosis not present

## 2018-06-08 DIAGNOSIS — M545 Low back pain, unspecified: Secondary | ICD-10-CM

## 2018-06-08 DIAGNOSIS — M5412 Radiculopathy, cervical region: Secondary | ICD-10-CM

## 2018-06-08 NOTE — Assessment & Plan Note (Addendum)
Likely playing a role as well.  We discussed the possibility of surgical intervention.  Wants to go under the possibility of the epidural first and see how patient responds and will discuss from there. Spent  25 minutes with patient face-to-face and had greater than 50% of counseling including as described above in assessment and plan.

## 2018-06-08 NOTE — Patient Instructions (Signed)
Good to see you  I know this has been a long process Xray oredered today for the low back  Call [phone] to have the epidural for the neck scheduled Once you know when that is then make an appointment with me 2-3 weeks after the injection

## 2018-06-08 NOTE — Assessment & Plan Note (Signed)
Patient's MRI showed some spinal stenosis we will have patient have an epidural to see how patient responds.  Warned of potential side effects.  Patient will follow-up 2 to 3 weeks afterwards.  Continue to have difficulty with the rotator cuff and will send for surgical intervention.  Patient is in agreement with the plan.

## 2018-06-22 ENCOUNTER — Ambulatory Visit
Admission: RE | Admit: 2018-06-22 | Discharge: 2018-06-22 | Disposition: A | Discharge status: Home / Self Care | Payer: BLUE CROSS/BLUE SHIELD | Source: Ambulatory Visit | Attending: Family Medicine | Admitting: Family Medicine

## 2018-06-22 DIAGNOSIS — M545 Low back pain, unspecified: Secondary | ICD-10-CM

## 2018-06-22 DIAGNOSIS — M47812 Spondylosis without myelopathy or radiculopathy, cervical region: Secondary | ICD-10-CM | POA: Diagnosis not present

## 2018-06-22 MED ORDER — IOPAMIDOL (ISOVUE-M 300) INJECTION 61%
1.0000 mL | Freq: Once | INTRAMUSCULAR | Status: AC | PRN
  Administered 2018-06-22: 1 mL via EPIDURAL

## 2018-06-22 MED ORDER — TRIAMCINOLONE ACETONIDE 40 MG/ML IJ SUSP (RADIOLOGY)
60.0000 mg | Freq: Once | INTRAMUSCULAR | Status: AC
  Administered 2018-06-22: 60 mg via EPIDURAL

## 2018-06-22 NOTE — Discharge Instructions (Signed)

## 2018-06-27 ENCOUNTER — Encounter: Payer: Self-pay | Admitting: Family Medicine

## 2018-06-29 LAB — HM DIABETES EYE EXAM

## 2018-07-01 ENCOUNTER — Ambulatory Visit (INDEPENDENT_AMBULATORY_CARE_PROVIDER_SITE_OTHER): Payer: BLUE CROSS/BLUE SHIELD | Admitting: Internal Medicine

## 2018-07-01 ENCOUNTER — Ambulatory Visit: Payer: Self-pay

## 2018-07-01 ENCOUNTER — Other Ambulatory Visit (INDEPENDENT_AMBULATORY_CARE_PROVIDER_SITE_OTHER): Payer: BLUE CROSS/BLUE SHIELD

## 2018-07-01 ENCOUNTER — Encounter: Payer: Self-pay | Admitting: Family Medicine

## 2018-07-01 ENCOUNTER — Encounter: Payer: Self-pay | Admitting: Internal Medicine

## 2018-07-01 VITALS — BP 184/102 | HR 81 | Temp 98.3°F | Resp 16 | Ht 62.0 in | Wt 123.1 lb

## 2018-07-01 DIAGNOSIS — E119 Type 2 diabetes mellitus without complications: Secondary | ICD-10-CM

## 2018-07-01 DIAGNOSIS — R42 Dizziness and giddiness: Secondary | ICD-10-CM

## 2018-07-01 DIAGNOSIS — I10 Essential (primary) hypertension: Secondary | ICD-10-CM

## 2018-07-01 DIAGNOSIS — Z23 Encounter for immunization: Secondary | ICD-10-CM | POA: Diagnosis not present

## 2018-07-01 HISTORY — DX: Dizziness and giddiness: R42

## 2018-07-01 LAB — COMPREHENSIVE METABOLIC PANEL
ALBUMIN: 4.5 g/dL (ref 3.5–5.2)
ALK PHOS: 57 U/L (ref 39–117)
ALT: 10 U/L (ref 0–35)
AST: 9 U/L (ref 0–37)
BUN: 13 mg/dL (ref 6–23)
CALCIUM: 10 mg/dL (ref 8.4–10.5)
CO2: 29 mEq/L (ref 19–32)
Chloride: 103 mEq/L (ref 96–112)
Creatinine, Ser: 0.65 mg/dL (ref 0.40–1.20)
GFR: 118.64 mL/min (ref >60.00)
Glucose, Bld: 166 mg/dL — ABNORMAL HIGH (ref 70–99)
POTASSIUM: 4.3 meq/L (ref 3.5–5.1)
Sodium: 139 mEq/L (ref 135–145)
Total Bilirubin: 0.4 mg/dL (ref 0.2–1.2)
Total Protein: 7.5 g/dL (ref 6.0–8.3)

## 2018-07-01 LAB — CBC WITH DIFFERENTIAL/PLATELET
BASOS PCT: 1.5 % (ref 0.0–3.0)
Basophils Absolute: 0.2 10*3/uL — ABNORMAL HIGH (ref 0.0–0.1)
EOS PCT: 1.8 % (ref 0.0–5.0)
Eosinophils Absolute: 0.2 10*3/uL (ref 0.0–0.7)
HEMATOCRIT: 36.4 % (ref 36.0–46.0)
HEMOGLOBIN: 11.9 g/dL — AB (ref 12.0–15.0)
Lymphocytes Relative: 28.7 % (ref 12.0–46.0)
Lymphs Abs: 3.2 10*3/uL (ref 0.7–4.0)
MCHC: 32.6 g/dL (ref 30.0–36.0)
MCV: 79.3 fl (ref 78.0–100.0)
MONO ABS: 0.8 10*3/uL (ref 0.1–1.0)
MONOS PCT: 7.3 % (ref 3.0–12.0)
Neutro Abs: 6.7 10*3/uL (ref 1.4–7.7)
Neutrophils Relative %: 60.7 % (ref 43.0–77.0)
Platelets: 434 10*3/uL — ABNORMAL HIGH (ref 150.0–400.0)
RBC: 4.58 Mil/uL (ref 3.87–5.11)
RDW: 15.4 % (ref 11.5–15.5)
WBC: 11 10*3/uL — ABNORMAL HIGH (ref 4.0–10.5)

## 2018-07-01 LAB — HEMOGLOBIN A1C: HEMOGLOBIN A1C: 8.3 % — AB (ref 4.6–6.5)

## 2018-07-01 NOTE — Assessment & Plan Note (Signed)
Currently blood pressure not controlled-likely related to recent steroid injection Increase lisinopril to 20 mg daily-usually takes in the evening Monitor blood pressure at home Follow-up with me in 5 days since PCP is not available CMP, CBC, A1c

## 2018-07-01 NOTE — Telephone Encounter (Signed)
Pt called with C/O dizziness after a steroid injection to relieve pinch nerve in her neck. Pt states she is diabetic and her BS today has finally started to come down "252". Has been as high as 399. Pt states she was told her BS would be high after the injection. She has communicated this dizziness and high BS to Z Smith DO. Pt unable to take her BP or HR.  Pt states her pain has really improved but the dizziness has not. Appointment made per protocol.  Care advice read to patient. Patient verbalized understanding of all instructions.  Reason for Disposition . [1] MODERATE dizziness (e.g., interferes with normal activities) AND [2] has been evaluated by physician for this  Answer Assessment - Initial Assessment Questions 1. DESCRIPTION: "Describe your dizziness."     lightheaded 2. LIGHTHEADED: "Do you feel lightheaded?" (e.g., somewhat faint, woozy, weak upon standing)     faint 3. VERTIGO: "Do you feel like either you or the room is spinning or tilting?" (i.e. vertigo)     Spinning with sudden movements 4. SEVERITY: "How bad is it?"  "Do you feel like you are going to faint?" "Can you stand and walk?"   - MILD - walking normally   - MODERATE - interferes with normal activities (e.g., work, school)    - SEVERE - unable to stand, requires support to walk, feels like passing out now.      moderate 5. ONSET:  "When did the dizziness begin?"     After  Steroid into neck shoulder area for pinch nerve in neck 6. AGGRAVATING FACTORS: "Does anything make it worse?" (e.g., standing, change in head position)     Change in position  7. HEART RATE: "Can you tell me your heart rate?" "How many beats in 15 seconds?"  (Note: not all patients can do this)      no 8. CAUSE: "What do you think is causing the dizziness?"     Steroid injection 9. RECURRENT SYMPTOM: "Have you had dizziness before?" If so, ask: "When was the last time?" "What happened that time?"     no 10. OTHER SYMPTOMS: "Do you have any  other symptoms?" (e.g., fever, chest pain, vomiting, diarrhea, bleeding)      no 11. PREGNANCY: "Is there any chance you are pregnant?" "When was your last menstrual period?"       Menopause  Protocols used: DIZZINESS [facility]

## 2018-07-01 NOTE — Assessment & Plan Note (Signed)
Dizziness/lightheadedness may be from elevated sugars, elevated blood pressure and recent steroid injection No neurological deficits or concerns for neurological cause We will adjust medications to improve sugars and blood pressure Would expect dizziness/lightheadedness to resolve over the next few days Follow-up in 5 days, sooner if needed Check CBC, CMP, A1c

## 2018-07-01 NOTE — Assessment & Plan Note (Signed)
Sugars elevated up to 399 after steroid injection Recheck A1c to make sure she has good overall/chronic control Continue metformin 1000 mg twice daily Increase glipizide to 10 g in morning, continue 5 mg in evening Monitor sugars Follow-up with me in 5 days since PCP is not available

## 2018-07-01 NOTE — Progress Notes (Signed)
Subjective:    Patient ID: Ruth Crawford, female    DOB: 03-06-1956, 62 y.o.   MRN: [id number]  HPI The patient is here for an acute visit.  Monday 06/22/18 she has an epidural in her right upper back-neck at [facility]. She had a steroid injection to help relieve a pinched nerve in her neck.   She has had steroids in the past and they have elevated her sugar.  She states her diabetes is typically controlled.  Her last blood work was over one year ago.  The past couple of days she has been wobbly.  She has been experiencing lightheadedness/dizziness.  Overall she does not feel well.  She thinks is related to the steroid injection.  Lightheadedness/dizziness: She is feeling a combination of dizziness and lightheadedness.  It started a couple of days after the injection and has persisted.  Since then her sugars have been high.  The sugars went as high as 399, but have started to come down.  Her blood sugar earlier today was 252.  She is taking her medication daily as prescribed for diabetes. Her last a1c was 8.2% in July of 2018.    Her blood pressure is also very elevated here today.  She states is usually around 135 systolically.  She does not check her blood pressure on a regular basis.  She has been taking her medication as prescribed.   Medications and allergies reviewed with patient and updated if appropriate.  Patient Active Problem List   Diagnosis Date Noted  . Nail, ingrown 04/13/2018  . Onychomycosis 04/13/2018  . 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/02/2018  . Right rotator cuff tear 01/05/2018  . Right cervical radiculopathy 12/12/2017  . Acute shoulder bursitis, right 12/12/2017  . Seasonal allergic rhinitis due to pollen 04/17/2017  . 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 Left-sided face pain 03/26/2017  . TMJ tenderness, left 02/05/2017  . Anemia 08/30/2016  . Parotitis, acute 03/26/2016  . Coccygeal pain, acute 02/28/2016  . Rash 10/18/2015  . Urine  frequency 05/30/2015  . Lower back pain 05/30/2015  . Allergic conjunctivitis 03/02/2015  . Bursitis of right shoulder 05/09/2014  . Acute bronchitis 03/23/2014  . Seasonal allergic conjunctivitis 01/01/2014  . Food allergy 01/01/2014  . Conjunctivitis 09/12/2013  . Seasonal and perennial allergic rhinitis 09/12/2013  . Abdominal pain, other specified site 03/18/2013  . GERD (gastroesophageal reflux disease) 10/17/2012  . Preventative health care 04/07/2012  . POLYP, GALLBLADDER 11/30/2010  . SHOULDER PAIN, RIGHT 11/30/2010  . CERVICAL RADICULOPATHY, RIGHT 11/30/2010  . DEPRESSION 03/26/2010  . Headache(784.0) 03/26/2010  . SYNCOPE 06/07/2009  . Diabetes (HCC) 10/29/2007  . Hyperlipidemia 10/29/2007  . Anxiety state 10/29/2007  . COMMON MIGRAINE 10/29/2007  . Essential hypertension 10/29/2007  . BACK PAIN 10/29/2007    Current Outpatient Medications on File Prior to Visit  Medication Sig Dispense Refill  . Aspirin-Acetaminophen-Caffeine (EXCEDRIN MIGRAINE PO) Take by mouth as needed.    Marland Kitchen azelastine (OPTIVAR) 0.05 % ophthalmic solution INSTILL ONE DROP INTO EACH EYE TWICE DAILY 6 mL 12  . cyclobenzaprine (FLEXERIL) 5 MG tablet Take 1 tablet (5 mg total) by mouth 3 (three) times daily as needed for muscle spasms. 40 tablet 1  . Diclofenac Sodium (PENNSAID) 2 % SOLN Place 2 g onto the skin 2 (two) times daily. 112 g 3  . diphenhydrAMINE (BENADRYL) 25 MG tablet Take 25 mg by mouth daily as needed.    Marland Kitchen FLUoxetine (PROZAC) 20 MG capsule TAKE ONE CAPSULE  BY MOUTH ONCE DAILY 90 capsule 3  . GLIPIZIDE XL 5 MG 24 hr tablet TAKE 1 TABLET BY MOUTH TWICE DAILY 180 tablet 1  . glucose blood test strip 1 each by Other route as needed for other. RELION PRIME TEST STRIPS  Use as instructed    . lisinopril (PRINIVIL,ZESTRIL) 10 MG tablet TAKE 1 TABLET BY MOUTH ONCE DAILY 90 tablet 1  . lovastatin (MEVACOR) 40 MG tablet TAKE 1 TABLET BY MOUTH ONCE DAILY 90 tablet 3  . meloxicam (MOBIC) 15 MG  tablet Take 1 tablet (15 mg total) by mouth daily as needed for pain. 30 tablet 2  . metFORMIN (GLUCOPHAGE) 1000 MG tablet TAKE 1 TABLET BY MOUTH TWICE DAILY WITH MEALS 180 tablet 1  . phenylephrine (SUDAFED PE) 10 MG TABS tablet Take 10 mg by mouth daily.    Marland Kitchen RELION LANCETS STANDARD 21G MISC by Does not apply route.    . terbinafine (LAMISIL) 250 MG tablet Take 1 tablet (250 mg total) by mouth daily. 30 tablet 2  . traMADol (ULTRAM) 50 MG tablet Take 1 tablet (50 mg total) by mouth every 6 (six) hours as needed. 30 tablet 1  . Vitamin D, Ergocalciferol, (DRISDOL) 50000 units CAPS capsule Take 1 capsule (50,000 Units total) by mouth every 7 (seven) days. 12 capsule 0   No current facility-administered medications on file prior to visit.     Past Medical History:  Diagnosis Date  . Allergy   . ANXIETY   . COMMON MIGRAINE   . DEPRESSION   . DIABETES MELLITUS, TYPE II   . GERD (gastroesophageal reflux disease) 10/17/2012  . HYPERLIPIDEMIA   . HYPERTENSION   . POLYP, GALLBLADDER   . Sickle cell trait [facility])     Past Surgical History:  Procedure Laterality Date  . ABDOMINAL HYSTERECTOMY  2000  . COLONOSCOPY    . LUMBAR DISC SURGERY  1998, 2016, 2017   s/p    Social History   Socioeconomic History  . Marital status: Married    Spouse name: Not on file  . Number of children: 1  . Years of education: Not on file  . Highest education level: Not on file  Occupational History  . Occupation: Teacher, music    Comment: at [employer]  Social Needs  . Financial resource strain: Not on file  . Food insecurity:    Worry: Not on file    Inability: Not on file  . Transportation needs:    Medical: Not on file    Non-medical: Not on file  Tobacco Use  . Smoking status: Never Smoker  . Smokeless tobacco: Never Used  Substance and Sexual Activity  . Alcohol use: Yes    Comment: occasional use  . Drug use: No  . Sexual activity: Not on file  Lifestyle  . Physical  activity:    Days per week: Not on file    Minutes per session: Not on file  . Stress: Not on file  Relationships  . Social connections:    Talks on phone: Not on file    Gets together: Not on file    Attends religious service: Not on file    Active member of club or organization: Not on file    Attends meetings of clubs or organizations: Not on file    Relationship status: Not on file  Other Topics Concern  . Not on file  Social History Narrative  . Not on file    Family History  Problem Relation Age of Onset  . Diabetes Mother   . Heart disease Father   . Stroke Other   . Alcohol abuse Other   . Arthritis Other   . Colon cancer Neg Hx   . Esophageal cancer Neg Hx   . Rectal cancer Neg Hx   . Stomach cancer Neg Hx     Review of Systems  Constitutional: Negative for chills and fever.  Eyes: Positive for visual disturbance (blurry vision from elevated sugars - saw eye doctor two days ago).  Respiratory: Negative for cough, shortness of breath and wheezing.   Cardiovascular: Negative for chest pain, palpitations and leg swelling.  Gastrointestinal: Negative for abdominal pain and nausea.  Neurological: Positive for dizziness and light-headedness. Negative for weakness, numbness and headaches.       Objective:   Vitals:   07/01/18 1520  BP: (!) 184/102  Pulse: 81  Resp: 16  Temp: 98.3 F (36.8 C)  SpO2: 95%   BP Readings from Last 3 Encounters:  07/01/18 (!) 184/102  06/22/18 (!) 161/77  06/08/18 (!) 132/98   Wt Readings from Last 3 Encounters:  07/01/18 123 lb 1.9 oz (55.8 kg)  06/08/18 123 lb (55.8 kg)  04/13/18 123 lb (55.8 kg)   Body mass index is 22.52 kg/m.   Physical Exam  Constitutional: She is oriented to person, place, and time. She appears well-developed and well-nourished. No distress.  HENT:  Head: Normocephalic and atraumatic.  Eyes: Conjunctivae and EOM are normal.  Neck: Neck supple. No tracheal deviation present. No thyromegaly  present.  Cardiovascular: Normal rate and regular rhythm.  Pulmonary/Chest: Effort normal and breath sounds normal. No respiratory distress. She has no wheezes. She has no rales.  Musculoskeletal: She exhibits no edema.  Lymphadenopathy:    She has no cervical adenopathy.  Neurological: She is alert and oriented to person, place, and time. No cranial nerve deficit or sensory deficit.  Skin: Skin is warm and dry. She is not diaphoretic.  Psychiatric: She has a normal mood and affect.           Assessment & Plan:    See Problem List for Assessment and Plan of chronic medical problems.

## 2018-07-01 NOTE — Patient Instructions (Addendum)
Take 2 glipizide in the morning and continue 1 glipizide in the evening.  Once your sugars are back to normal go back to the 1 pill twice a day.  Continue the metformin at your current dose.    Start taking 2 lisinopril at night until your BP returns to normal.    Monitor your sugars and BP at home.   Have blood work done today.    Follow up with me on Monday.

## 2018-07-01 NOTE — Telephone Encounter (Signed)
FYI

## 2018-07-03 ENCOUNTER — Ambulatory Visit: Payer: BLUE CROSS/BLUE SHIELD

## 2018-07-05 NOTE — Progress Notes (Signed)
Subjective:    Patient ID: Ruth Crawford, female    DOB: 02/10/1956, 62 y.o.   MRN: [id number]  HPI The patient is here for follow up.  She was here 5 days ago for dizziness after having an epidural.  Her BP was elevated and her sugars were elevated.  We discussed that the dizziness/lightheadedness may be from the epidural itself or from a combination of her sugars being elevated and blood pressure being elevated.  The dizziness/lightheadedness could also be coincidental and just vertigo.  She states that the lightheadedness/dizziness has improved, but she still has some lightheadedness at this time.  It is mild..  She denies dizziness at this time. .  Hypertension:  We increased her lisinopril to 20 mg daily.  She is taking all of her medications as prescribed.  At home her BP has been:   125/67, 123/72.  She feels her blood pressure has been controlled at home.  It is still slightly elevated here.  Diabetes:  We increased her glipizide to 10 mg in the morning and continued the 5 mg in the evening.  She continued the metformin 1000 mg twice daily.  She has monitoring her sugars at home - 188 this morning.  She states her sugars have improved, but are still elevated.  Her recent A1c was 8.3% and just over a year ago it was 8.2%.  She is not currently exercising, but plans on getting back to it once able.  Medications and allergies reviewed with patient and updated if appropriate.  Patient Active Problem List   Diagnosis Date Noted  . Dizziness 07/01/2018  . Nail, ingrown 04/13/2018  . Onychomycosis 04/13/2018  . 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/02/2018  . Right rotator cuff tear 01/05/2018  . Right cervical radiculopathy 12/12/2017  . Acute shoulder bursitis, right 12/12/2017  . Seasonal allergic rhinitis due to pollen 04/17/2017  . 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 Left-sided face pain 03/26/2017  . TMJ tenderness, left 02/05/2017  . Anemia 08/30/2016  . Parotitis, acute  03/26/2016  . Coccygeal pain, acute 02/28/2016  . Rash 10/18/2015  . Urine frequency 05/30/2015  . Lower back pain 05/30/2015  . Allergic conjunctivitis 03/02/2015  . Bursitis of right shoulder 05/09/2014  . Acute bronchitis 03/23/2014  . Seasonal allergic conjunctivitis 01/01/2014  . Food allergy 01/01/2014  . Conjunctivitis 09/12/2013  . Seasonal and perennial allergic rhinitis 09/12/2013  . Abdominal pain, other specified site 03/18/2013  . GERD (gastroesophageal reflux disease) 10/17/2012  . Preventative health care 04/07/2012  . POLYP, GALLBLADDER 11/30/2010  . SHOULDER PAIN, RIGHT 11/30/2010  . CERVICAL RADICULOPATHY, RIGHT 11/30/2010  . DEPRESSION 03/26/2010  . Headache(784.0) 03/26/2010  . SYNCOPE 06/07/2009  . Diabetes (HCC) 10/29/2007  . Hyperlipidemia 10/29/2007  . Anxiety state 10/29/2007  . COMMON MIGRAINE 10/29/2007  . Essential hypertension 10/29/2007  . BACK PAIN 10/29/2007    Current Outpatient Medications on File Prior to Visit  Medication Sig Dispense Refill  . Aspirin-Acetaminophen-Caffeine (EXCEDRIN MIGRAINE PO) Take by mouth as needed.    Marland Kitchen azelastine (OPTIVAR) 0.05 % ophthalmic solution INSTILL ONE DROP INTO EACH EYE TWICE DAILY 6 mL 12  . cyclobenzaprine (FLEXERIL) 5 MG tablet Take 1 tablet (5 mg total) by mouth 3 (three) times daily as needed for muscle spasms. 40 tablet 1  . Diclofenac Sodium (PENNSAID) 2 % SOLN Place 2 g onto the skin 2 (two) times daily. 112 g 3  . diphenhydrAMINE (BENADRYL) 25 MG tablet Take 25 mg by mouth daily  as needed.    Marland Kitchen FLUoxetine (PROZAC) 20 MG capsule TAKE ONE CAPSULE BY MOUTH ONCE DAILY 90 capsule 3  . GLIPIZIDE XL 5 MG 24 hr tablet TAKE 1 TABLET BY MOUTH TWICE DAILY 180 tablet 1  . glucose blood test strip 1 each by Other route as needed for other. RELION PRIME TEST STRIPS  Use as instructed    . lisinopril (PRINIVIL,ZESTRIL) 10 MG tablet TAKE 1 TABLET BY MOUTH ONCE DAILY 90 tablet 1  . lovastatin (MEVACOR) 40 MG  tablet TAKE 1 TABLET BY MOUTH ONCE DAILY 90 tablet 3  . meloxicam (MOBIC) 15 MG tablet Take 1 tablet (15 mg total) by mouth daily as needed for pain. 30 tablet 2  . metFORMIN (GLUCOPHAGE) 1000 MG tablet TAKE 1 TABLET BY MOUTH TWICE DAILY WITH MEALS 180 tablet 1  . phenylephrine (SUDAFED PE) 10 MG TABS tablet Take 10 mg by mouth daily.    Marland Kitchen RELION LANCETS STANDARD 21G MISC by Does not apply route.    . terbinafine (LAMISIL) 250 MG tablet Take 1 tablet (250 mg total) by mouth daily. 30 tablet 2  . traMADol (ULTRAM) 50 MG tablet Take 1 tablet (50 mg total) by mouth every 6 (six) hours as needed. 30 tablet 1  . Vitamin D, Ergocalciferol, (DRISDOL) 50000 units CAPS capsule Take 1 capsule (50,000 Units total) by mouth every 7 (seven) days. 12 capsule 0   No current facility-administered medications on file prior to visit.     Past Medical History:  Diagnosis Date  . Allergy   . ANXIETY   . COMMON MIGRAINE   . DEPRESSION   . DIABETES MELLITUS, TYPE II   . GERD (gastroesophageal reflux disease) 10/17/2012  . HYPERLIPIDEMIA   . HYPERTENSION   . POLYP, GALLBLADDER   . Sickle cell trait [facility])     Past Surgical History:  Procedure Laterality Date  . ABDOMINAL HYSTERECTOMY  2000  . COLONOSCOPY    . LUMBAR DISC SURGERY  1998, 2016, 2017   s/p    Social History   Socioeconomic History  . Marital status: Married    Spouse name: Not on file  . Number of children: 1  . Years of education: Not on file  . Highest education level: Not on file  Occupational History  . Occupation: Teacher, music    Comment: at [employer]  Social Needs  . Financial resource strain: Not on file  . Food insecurity:    Worry: Not on file    Inability: Not on file  . Transportation needs:    Medical: Not on file    Non-medical: Not on file  Tobacco Use  . Smoking status: Never Smoker  . Smokeless tobacco: Never Used  Substance and Sexual Activity  . Alcohol use: Yes    Comment: occasional use    . Drug use: No  . Sexual activity: Not on file  Lifestyle  . Physical activity:    Days per week: Not on file    Minutes per session: Not on file  . Stress: Not on file  Relationships  . Social connections:    Talks on phone: Not on file    Gets together: Not on file    Attends religious service: Not on file    Active member of club or organization: Not on file    Attends meetings of clubs or organizations: Not on file    Relationship status: Not on file  Other Topics Concern  . Not on file  Social History Narrative  . Not on file    Family History  Problem Relation Age of Onset  . Diabetes Mother   . Heart disease Father   . Stroke Other   . Alcohol abuse Other   . Arthritis Other   . Colon cancer Neg Hx   . Esophageal cancer Neg Hx   . Rectal cancer Neg Hx   . Stomach cancer Neg Hx     Review of Systems  Constitutional: Negative for chills and fever.  Respiratory: Negative for cough, shortness of breath and wheezing.   Cardiovascular: Negative for chest pain, palpitations and leg swelling.  Neurological: Positive for light-headedness. Negative for headaches.       Objective:   Vitals:   07/06/18 1457  BP: (!) 150/92  Pulse: 94  Resp: 16  Temp: 98.9 F (37.2 C)  SpO2: 98%   BP Readings from Last 3 Encounters:  07/06/18 (!) 150/92  07/01/18 (!) 184/102  06/22/18 (!) 161/77   Wt Readings from Last 3 Encounters:  07/06/18 124 lb (56.2 kg)  07/01/18 123 lb 1.9 oz (55.8 kg)  06/08/18 123 lb (55.8 kg)   Body mass index is 22.68 kg/m.   Physical Exam    Constitutional: Appears well-developed and well-nourished. No distress.  HENT:  Head: Normocephalic and atraumatic.  Neck: Neck supple. No tracheal deviation present. No thyromegaly present.  No cervical lymphadenopathy Cardiovascular: Normal rate, regular rhythm and normal heart sounds.   No murmur heard. No carotid bruit .  No edema Pulmonary/Chest: Effort normal and breath sounds normal. No  respiratory distress. No has no wheezes. No rales.  Skin: Skin is warm and dry. Not diaphoretic.  Psychiatric: Normal mood and affect. Behavior is normal.      Assessment & Plan:    See Problem List for Assessment and Plan of chronic medical problems.

## 2018-07-06 ENCOUNTER — Encounter: Payer: Self-pay | Admitting: Internal Medicine

## 2018-07-06 ENCOUNTER — Ambulatory Visit: Payer: BLUE CROSS/BLUE SHIELD | Admitting: Internal Medicine

## 2018-07-06 ENCOUNTER — Ambulatory Visit (INDEPENDENT_AMBULATORY_CARE_PROVIDER_SITE_OTHER): Payer: BLUE CROSS/BLUE SHIELD | Admitting: Internal Medicine

## 2018-07-06 VITALS — BP 150/92 | HR 94 | Temp 98.9°F | Resp 16 | Ht 62.0 in | Wt 124.0 lb

## 2018-07-06 DIAGNOSIS — R42 Dizziness and giddiness: Secondary | ICD-10-CM

## 2018-07-06 DIAGNOSIS — I10 Essential (primary) hypertension: Secondary | ICD-10-CM

## 2018-07-06 DIAGNOSIS — E119 Type 2 diabetes mellitus without complications: Secondary | ICD-10-CM | POA: Diagnosis not present

## 2018-07-06 MED ORDER — SAXAGLIPTIN HCL 5 MG PO TABS: 5.0000 mg | ORAL_TABLET | Freq: Every day | ORAL | 5 refills | Status: DC

## 2018-07-06 MED ORDER — LISINOPRIL 20 MG PO TABS: 20.0000 mg | ORAL_TABLET | Freq: Every day | ORAL | 3 refills | Status: DC

## 2018-07-06 MED ORDER — GLIPIZIDE ER 5 MG PO TB24: 5.0000 mg | ORAL_TABLET | Freq: Two times a day (BID) | ORAL | 1 refills | Status: DC

## 2018-07-06 NOTE — Patient Instructions (Signed)
Your goal BP is less than 140/90  Your goal sugar is 120-150.   Continue lisinopril 20 mg daily   Continue metformin at current dose Continue glipizide 5 mg twice daily Start onglyza 5 mg daily   Continue to increase your exercise.     Follow up with Dr Jonny Ruiz for your physical exam.

## 2018-07-06 NOTE — Assessment & Plan Note (Signed)
Was experiencing dizziness and this has improved This may have been related to the steroid injection, uncontrolled sugar, uncontrolled blood pressure or vertigo of some other cause Dizziness has improved Symptomatic treatment Call if symptoms do not continue to improve and resolve completely

## 2018-07-06 NOTE — Assessment & Plan Note (Signed)
Blood pressure still slightly elevated here, but better than her last visit Blood pressure seems to be well controlled at home Continue to monitor at home Continue lisinopril 20 mg daily-increased from 10 mg last week Advised her to schedule her physical with Dr. Jonny Ruiz

## 2018-07-06 NOTE — Assessment & Plan Note (Signed)
Recent A1c 8.3%-sugars have been higher at home recently due to epidural Transiently increase glipizide to 10 mg in the morning and 5 mg at night, but will return to 5 mg twice daily Continue metformin 1000 mg twice daily Start Onglyza 5 mg daily Start regular exercise when able We will follow-up with Dr. Jonny Ruiz

## 2018-08-03 DIAGNOSIS — D649 Anemia, unspecified: Secondary | ICD-10-CM | POA: Diagnosis not present

## 2018-08-03 DIAGNOSIS — Z801 Family history of malignant neoplasm of trachea, bronchus and lung: Secondary | ICD-10-CM | POA: Diagnosis not present

## 2018-08-03 DIAGNOSIS — Z808 Family history of malignant neoplasm of other organs or systems: Secondary | ICD-10-CM | POA: Diagnosis not present

## 2018-08-03 DIAGNOSIS — Z6823 Body mass index (BMI) 23.0-23.9, adult: Secondary | ICD-10-CM | POA: Diagnosis not present

## 2018-08-03 DIAGNOSIS — Z01419 Encounter for gynecological examination (general) (routine) without abnormal findings: Secondary | ICD-10-CM | POA: Diagnosis not present

## 2018-08-04 ENCOUNTER — Encounter: Payer: Self-pay | Admitting: Family Medicine

## 2018-08-05 ENCOUNTER — Telehealth: Payer: Self-pay

## 2018-08-05 NOTE — Telephone Encounter (Signed)
Copied from CRM [id number]. Topic: Appointment Scheduling - Scheduling Inquiry for Clinic >> Aug 05, 2018  2:43 PM Leafy RoRobinson, Norma J wrote: Reason for CRM: pt has sent dr Katrinka Blazingsmith my chart message and she is avail on Monday. Dr Katrinka Blazingsmith next Monday appt is 09-07-18. Pt would like a sooner appt. Pt is still having right shoulder pain

## 2018-08-05 NOTE — Telephone Encounter (Signed)
Left message for patient to call back for appointment.

## 2018-08-18 ENCOUNTER — Ambulatory Visit (INDEPENDENT_AMBULATORY_CARE_PROVIDER_SITE_OTHER): Payer: BLUE CROSS/BLUE SHIELD | Admitting: Family Medicine

## 2018-08-18 ENCOUNTER — Encounter: Payer: Self-pay | Admitting: Family Medicine

## 2018-08-18 VITALS — BP 138/100 | HR 91 | Ht 62.0 in | Wt 124.0 lb

## 2018-08-18 DIAGNOSIS — M75111 Incomplete rotator cuff tear or rupture of right shoulder, not specified as traumatic: Secondary | ICD-10-CM

## 2018-08-18 DIAGNOSIS — M5442 Lumbago with sciatica, left side: Secondary | ICD-10-CM

## 2018-08-18 DIAGNOSIS — M12811 Other specific arthropathies, not elsewhere classified, right shoulder: Secondary | ICD-10-CM

## 2018-08-18 DIAGNOSIS — G8929 Other chronic pain: Secondary | ICD-10-CM

## 2018-08-18 DIAGNOSIS — M75101 Unspecified rotator cuff tear or rupture of right shoulder, not specified as traumatic: Secondary | ICD-10-CM | POA: Diagnosis not present

## 2018-08-18 MED ORDER — GABAPENTIN 300 MG PO CAPS: 300.0000 mg | ORAL_CAPSULE | Freq: Every day | ORAL | 3 refills | Status: DC

## 2018-08-18 NOTE — Assessment & Plan Note (Signed)
Patient has lower back pain.  Describes pain as a dull, throbbing aching pain.  Degenerative disc disease.  Discussed icing regimen and home exercise.  Which activities to do which wants to avoid.  Patient will follow-up with me again in 4 to 6 weeks.

## 2018-08-18 NOTE — Progress Notes (Signed)
[name] D.O. [facility] [address] [place], [place] [address] Phone: [phone] Subjective:    I'm seeing this patient by the request  of:    CC: Right shoulder pain follow-up  BJY:[id number]:Subjective  Ruth PlaterSandy L Crawford is a 62 y.o. female coming in with complaint of right shoulder pain. Is in constant pain. Pain in deltoid muscle.  Patient was seen previously.  Had what appeared to be an anterior cyst of the shoulder that was aspirated back in April.  Continues to have discomfort and pain.  Found to have right acromial arthritis.  Injection in June that also only helped some.  Was sent for an MRI.  Found a partial full-thickness tear of the rotator cuff with minimal retraction as well as acromioclavicular arthrosis.  Patient still is having pain on a daily basis and can affect daily activities.  Patient is also complaining of right leg pain when sitting indian style.  Describes the pain as a dull, throbbing aching pain.  Nothing severe overall.  Does have it more on the left side than the right.  Sometimes intermittent radiation of the pain.       Past Medical History:  Diagnosis Date  . Allergy   . ANXIETY   . COMMON MIGRAINE   . DEPRESSION   . DIABETES MELLITUS, TYPE II   . GERD (gastroesophageal reflux disease) 10/17/2012  . HYPERLIPIDEMIA   . HYPERTENSION   . POLYP, GALLBLADDER   . Sickle cell trait [facility](HCC)    Past Surgical History:  Procedure Laterality Date  . ABDOMINAL HYSTERECTOMY  2000  . COLONOSCOPY    . LUMBAR DISC SURGERY  1998, 2016, 2017   s/p   Social History   Socioeconomic History  . Marital status: Married    Spouse name: Not on file  . Number of children: 1  . Years of education: Not on file  . Highest education level: Not on file  Occupational History  . Occupation: Teacher, musicbilling clerk    Comment: at [employer]  Social Needs  . Financial resource strain: Not on file  . Food insecurity:    Worry: Not on file    Inability: Not on file    . Transportation needs:    Medical: Not on file    Non-medical: Not on file  Tobacco Use  . Smoking status: Never Smoker  . Smokeless tobacco: Never Used  Substance and Sexual Activity  . Alcohol use: Yes    Comment: occasional use  . Drug use: No  . Sexual activity: Not on file  Lifestyle  . Physical activity:    Days per week: Not on file    Minutes per session: Not on file  . Stress: Not on file  Relationships  . Social connections:    Talks on phone: Not on file    Gets together: Not on file    Attends religious service: Not on file    Active member of club or organization: Not on file    Attends meetings of clubs or organizations: Not on file    Relationship status: Not on file  Other Topics Concern  . Not on file  Social History Narrative  . Not on file   Allergies  Allergen Reactions  . Demerol [Meperidine] Other (See Comments)    Per pt: unknown  . Prednisone Itching and Rash   Family History  Problem Relation Age of Onset  . Diabetes Mother   . Heart disease Father   .  Stroke Other   . Alcohol abuse Other   . Arthritis Other   . Colon cancer Neg Hx   . Esophageal cancer Neg Hx   . Rectal cancer Neg Hx   . Stomach cancer Neg Hx     Current Outpatient Medications (Endocrine & Metabolic):  .  glipiZIDE (GLIPIZIDE XL) 5 MG 24 hr tablet, Take 1 tablet (5 mg total) by mouth 2 (two) times daily. .  metFORMIN (GLUCOPHAGE) 1000 MG tablet, TAKE 1 TABLET BY MOUTH TWICE DAILY WITH MEALS .  saxagliptin HCl (ONGLYZA) 5 MG TABS tablet, Take 1 tablet (5 mg total) by mouth daily.  Current Outpatient Medications (Cardiovascular):  .  lisinopril (PRINIVIL,ZESTRIL) 20 MG tablet, Take 1 tablet (20 mg total) by mouth daily. Marland Kitchen  lovastatin (MEVACOR) 40 MG tablet, TAKE 1 TABLET BY MOUTH ONCE DAILY  Current Outpatient Medications (Respiratory):  .  diphenhydrAMINE (BENADRYL) 25 MG tablet, Take 25 mg by mouth daily as needed. .  phenylephrine (SUDAFED PE) 10 MG TABS tablet,  Take 10 mg by mouth daily.  Current Outpatient Medications (Analgesics):  Marland Kitchen  Aspirin-Acetaminophen-Caffeine (EXCEDRIN MIGRAINE PO), Take by mouth as needed. .  meloxicam (MOBIC) 15 MG tablet, Take 1 tablet (15 mg total) by mouth daily as needed for pain. .  traMADol (ULTRAM) 50 MG tablet, Take 1 tablet (50 mg total) by mouth every 6 (six) hours as needed.   Current Outpatient Medications (Other):  .  azelastine (OPTIVAR) 0.05 % ophthalmic solution, INSTILL ONE DROP INTO EACH EYE TWICE DAILY .  cyclobenzaprine (FLEXERIL) 5 MG tablet, Take 1 tablet (5 mg total) by mouth 3 (three) times daily as needed for muscle spasms. .  Diclofenac Sodium (PENNSAID) 2 % SOLN, Place 2 g onto the skin 2 (two) times daily. Marland Kitchen  FLUoxetine (PROZAC) 20 MG capsule, TAKE ONE CAPSULE BY MOUTH ONCE DAILY .  glucose blood test strip, 1 each by Other route as needed for other. RELION PRIME TEST STRIPS  Use as instructed .  RELION LANCETS STANDARD 21G MISC, by Does not apply route. .  terbinafine (LAMISIL) 250 MG tablet, Take 1 tablet (250 mg total) by mouth daily. .  Vitamin D, Ergocalciferol, (DRISDOL) 50000 units CAPS capsule, Take 1 capsule (50,000 Units total) by mouth every 7 (seven) days.    Past medical history, social, surgical and family history all reviewed in electronic medical record.  No pertanent information unless stated regarding to the chief complaint.   Review of Systems:  No headache, visual changes, nausea, vomiting, diarrhea, constipation, dizziness, abdominal pain, skin rash, fevers, chills, night sweats, weight loss, swollen lymph nodes, body aches, joint swelling, muscle aches, chest pain, shortness of breath, mood changes.   Objective  There were no vitals taken for this visit. Systems examined below as of    General: No apparent distress alert and oriented x3 mood and affect normal, dressed appropriately.  HEENT: Pupils equal, extraocular movements intact  Respiratory: Patient's speak in  full sentences and does not appear short of breath  Cardiovascular: No lower extremity edema, non tender, no erythema  Skin: Warm dry intact with no signs of infection or rash on extremities or on axial skeleton.  Abdomen: Soft nontender  Neuro: Cranial nerves II through XII are intact, neurovascularly intact in all extremities with 2+ DTRs and 2+ pulses.  Lymph: No lymphadenopathy of posterior or anterior cervical chain or axillae bilaterally.  Gait normal with good balance and coordination.  MSK:  Non tender with full range of motion and  good stability and symmetric strength and tone of  elbows, wrist, hip, knee and ankles bilaterally.  Shoulder exam still shows positive impingement.  Positive crossover test.  Tenderness over the acromioclavicular joint more than the rotator cuff itself.  4+ out of 5 strength compared to the contralateral side.  Near full range of motion of the shoulder still noted and neurovascular intact distally  Back exam has mild loss of lordosis.  Pain more in the paraspinal musculature lumbar spine on the left side.  Tightness of the hamstring.  Negative straight leg test but tightness of the hamstring.  Neurovascular intact distally with 5 out of 5 symmetric strength    Impression and Recommendations:     This case required medical decision making of moderate complexity. The above documentation has been reviewed and is accurate and complete Wilford Grist       Note: This dictation was prepared with Dragon dictation along with smaller phrase technology. Any transcriptional errors that result from this process are unintentional.

## 2018-08-18 NOTE — Patient Instructions (Signed)
Good to see you  Dr. Ave Filterhandler will be good for your shoulder and they should call you  New exercises for the back 3 times a week  Gabapentin 300mg  at night See me again in 4 weeks

## 2018-08-18 NOTE — Assessment & Plan Note (Signed)
Patient does have a rotator cuff tear.  Discussed icing regimen and home exercises but patient has failed all conservative therapy including injection and formal physical therapy.  Will send to orthopedic surgeon for further evaluation

## 2018-08-19 DIAGNOSIS — Z13820 Encounter for screening for osteoporosis: Secondary | ICD-10-CM | POA: Diagnosis not present

## 2018-08-19 DIAGNOSIS — Z1231 Encounter for screening mammogram for malignant neoplasm of breast: Secondary | ICD-10-CM | POA: Diagnosis not present

## 2018-08-26 ENCOUNTER — Encounter: Payer: Self-pay | Admitting: Internal Medicine

## 2018-08-26 ENCOUNTER — Other Ambulatory Visit (INDEPENDENT_AMBULATORY_CARE_PROVIDER_SITE_OTHER): Payer: BLUE CROSS/BLUE SHIELD

## 2018-08-26 ENCOUNTER — Ambulatory Visit (INDEPENDENT_AMBULATORY_CARE_PROVIDER_SITE_OTHER): Payer: BLUE CROSS/BLUE SHIELD | Admitting: Internal Medicine

## 2018-08-26 VITALS — BP 136/84 | HR 104 | Temp 97.9°F | Ht 62.0 in | Wt 126.0 lb

## 2018-08-26 DIAGNOSIS — I10 Essential (primary) hypertension: Secondary | ICD-10-CM

## 2018-08-26 DIAGNOSIS — E119 Type 2 diabetes mellitus without complications: Secondary | ICD-10-CM

## 2018-08-26 DIAGNOSIS — Z01818 Encounter for other preprocedural examination: Secondary | ICD-10-CM | POA: Diagnosis not present

## 2018-08-26 DIAGNOSIS — Z0000 Encounter for general adult medical examination without abnormal findings: Secondary | ICD-10-CM

## 2018-08-26 DIAGNOSIS — Z23 Encounter for immunization: Secondary | ICD-10-CM

## 2018-08-26 DIAGNOSIS — M75111 Incomplete rotator cuff tear or rupture of right shoulder, not specified as traumatic: Secondary | ICD-10-CM

## 2018-08-26 DIAGNOSIS — Z0001 Encounter for general adult medical examination with abnormal findings: Secondary | ICD-10-CM

## 2018-08-26 DIAGNOSIS — M519 Unspecified thoracic, thoracolumbar and lumbosacral intervertebral disc disorder: Secondary | ICD-10-CM

## 2018-08-26 HISTORY — DX: Unspecified thoracic, thoracolumbar and lumbosacral intervertebral disc disorder: M51.9

## 2018-08-26 LAB — HEPATIC FUNCTION PANEL
ALK PHOS: 59 U/L (ref 39–117)
ALT: 9 U/L (ref 0–35)
AST: 13 U/L (ref 0–37)
Albumin: 4.3 g/dL (ref 3.5–5.2)
BILIRUBIN DIRECT: 0 mg/dL (ref 0.0–0.3)
Total Bilirubin: 0.3 mg/dL (ref 0.2–1.2)
Total Protein: 7.2 g/dL (ref 6.0–8.3)

## 2018-08-26 LAB — URINALYSIS, ROUTINE W REFLEX MICROSCOPIC
Bilirubin Urine: NEGATIVE
Hgb urine dipstick: NEGATIVE
Ketones, ur: NEGATIVE
Leukocytes, UA: NEGATIVE
NITRITE: NEGATIVE
RBC / HPF: NONE SEEN (ref 0–?)
Specific Gravity, Urine: 1.015 (ref 1.000–1.030)
Total Protein, Urine: NEGATIVE
Urine Glucose: >=1000 — AB
Urobilinogen, UA: 0.2 (ref 0.0–1.0)
WBC, UA: NONE SEEN (ref 0–?)
pH: 5.5 (ref 5.0–8.0)

## 2018-08-26 LAB — LIPID PANEL
CHOLESTEROL: 122 mg/dL (ref 0–200)
HDL: 60.3 mg/dL (ref >39.00)
LDL CALC: 36 mg/dL (ref 0–99)
NonHDL: 61.25
Total CHOL/HDL Ratio: 2
Triglycerides: 125 mg/dL (ref 0.0–149.0)
VLDL: 25 mg/dL (ref 0.0–40.0)

## 2018-08-26 LAB — CBC WITH DIFFERENTIAL/PLATELET
Basophils Absolute: 0.1 10*3/uL (ref 0.0–0.1)
Basophils Relative: 1.3 % (ref 0.0–3.0)
Eosinophils Absolute: 0.4 10*3/uL (ref 0.0–0.7)
Eosinophils Relative: 4.4 % (ref 0.0–5.0)
HCT: 35.9 % — ABNORMAL LOW (ref 36.0–46.0)
Hemoglobin: 11.9 g/dL — ABNORMAL LOW (ref 12.0–15.0)
LYMPHS ABS: 1.8 10*3/uL (ref 0.7–4.0)
LYMPHS PCT: 21.4 % (ref 12.0–46.0)
MCHC: 33.1 g/dL (ref 30.0–36.0)
MCV: 79.7 fl (ref 78.0–100.0)
MONO ABS: 0.5 10*3/uL (ref 0.1–1.0)
Monocytes Relative: 6.4 % (ref 3.0–12.0)
Neutro Abs: 5.6 10*3/uL (ref 1.4–7.7)
Neutrophils Relative %: 66.5 % (ref 43.0–77.0)
Platelets: 437 10*3/uL — ABNORMAL HIGH (ref 150.0–400.0)
RBC: 4.51 Mil/uL (ref 3.87–5.11)
RDW: 15.5 % (ref 11.5–15.5)
WBC: 8.4 10*3/uL (ref 4.0–10.5)

## 2018-08-26 LAB — BASIC METABOLIC PANEL
BUN: 14 mg/dL (ref 6–23)
CHLORIDE: 103 meq/L (ref 96–112)
CO2: 28 meq/L (ref 19–32)
CREATININE: 0.61 mg/dL (ref 0.40–1.20)
Calcium: 9.3 mg/dL (ref 8.4–10.5)
GFR: 127.6 mL/min (ref >60.00)
GLUCOSE: 251 mg/dL — AB (ref 70–99)
Potassium: 3.9 mEq/L (ref 3.5–5.1)
SODIUM: 139 meq/L (ref 135–145)

## 2018-08-26 LAB — PROTIME-INR
INR: 1 ratio (ref 0.8–1.0)
PROTHROMBIN TIME: 11.7 s (ref 9.6–13.1)

## 2018-08-26 LAB — MICROALBUMIN / CREATININE URINE RATIO
CREATININE, U: 45.6 mg/dL
MICROALB/CREAT RATIO: 2.5 mg/g (ref 0.0–30.0)
Microalb, Ur: 1.1 mg/dL (ref 0.0–1.9)

## 2018-08-26 LAB — TSH: TSH: 0.95 u[IU]/mL (ref 0.35–4.50)

## 2018-08-26 MED ORDER — EXENATIDE ER 2 MG ~~LOC~~ PEN: 2.0000 mg | PEN_INJECTOR | SUBCUTANEOUS | 3 refills | Status: DC

## 2018-08-26 MED ORDER — ZOSTER VAC RECOMB ADJUVANTED 50 MCG/0.5ML IM SUSR: 0.5000 mL | Freq: Once | INTRAMUSCULAR | 1 refills | Status: AC

## 2018-08-26 NOTE — Assessment & Plan Note (Addendum)
Cleared for surgury, for preop labs as ordered, pt to call to make sure Friday surgury still planned  In addition to the time spent performing CPE, I spent an additional 25 minutes face to face,in which greater than 50% of this time was spent in counseling and coordination of care for patient's acute illness as documented, including the differential dx, treatment, further evaluation and other management of preop exam for IM, right rotater cuff tear, DM, HTN

## 2018-08-26 NOTE — Assessment & Plan Note (Signed)

## 2018-08-26 NOTE — Patient Instructions (Addendum)
You had the Prevnar 13 pneumonia shot today  Your shingles shot prescription was sent to [facility]  Please take all new medication as prescribed - the Bydureon (which is just like the trulicity but computer says it may be covered with your insurance)  Do not take the glipizide just prior to surgury  Please continue all other medications as before, and refills have been done if requested.  Please have the pharmacy call with any other refills you may need.  Please continue your efforts at being more active, low cholesterol diet, and weight control.  You are otherwise up to date with prevention measures today.  Please keep your appointments with your specialists as you may have planned  Please go to the LAB in the Basement (turn left off the elevator) for the tests to be done today  You will be contacted by phone if any changes need to be made immediately.  Otherwise, you will receive a letter about your results with an explanation, but please check with MyChart first.  Please remember to sign up for MyChart if you have not done so, as this will be important to you in the future with finding out test results, communicating by private email, and scheduling acute appointments online when needed.  Please return in 6 months, or sooner if needed, with Lab testing done 3-5 days before

## 2018-08-26 NOTE — Assessment & Plan Note (Signed)
For surgical tx dec 13,  to f/u any worsening symptoms or concerns

## 2018-08-26 NOTE — Assessment & Plan Note (Signed)
stable overall by history and exam, recent data reviewed with pt, and pt to continue medical treatment as before,  to f/u any worsening symptoms or concerns  

## 2018-08-26 NOTE — Progress Notes (Signed)
Subjective:    Patient ID: Ruth Crawford, female    DOB: [birth year], 62 y.o.   MRN: [id number]  HPI  Here for wellness and f/u;  Overall doing ok;  Pt denies Chest pain, worsening SOB, DOE, wheezing, orthopnea, PND, worsening LE edema, palpitations, dizziness or syncope.  Pt denies neurological change such as new headache, facial or extremity weakness.  Pt denies polydipsia, polyuria, or low sugar symptoms. Pt states overall good compliance with treatment and medications, good tolerability, and has been trying to follow appropriate diet.  Pt denies worsening depressive symptoms, suicidal ideation or panic. No fever, night sweats, wt loss, loss of appetite, or other constitutional symptoms.  Pt states good ability with ADL's, has low fall risk, home safety reviewed and adequate, no other significant changes in hearing or vision, and only occasionally active with exercise, very much limited to leg work only at the gym due to neck and shoulder issues.  Has had total fo 2 cortisoine injections, with increased sugars overall persistent since then. Remains good compliance with all meds.  Has right shoulder rot cuff surgury planned for Dec 13. Has been more forgetful lately, stressed more lately.  Has not yet had preop labs and getting concerned.  Last a1c < 3 mo.  Lab Results  Component Value Date   HGBA1C 8.3 (H) 07/01/2018  Could not afford the onglyza so not taking.  Willing to try trulicity after her surgury Past Medical History:  Diagnosis Date  . Allergy   . ANXIETY   . COMMON MIGRAINE   . DEPRESSION   . DIABETES MELLITUS, TYPE II   . GERD (gastroesophageal reflux disease) 10/17/2012  . HYPERLIPIDEMIA   . HYPERTENSION   . POLYP, GALLBLADDER   . Sickle cell trait [facility])    Past Surgical History:  Procedure Laterality Date  . ABDOMINAL HYSTERECTOMY  2000  . COLONOSCOPY    . LUMBAR DISC SURGERY  1998, 2016, 2017   s/p    reports that she has never smoked. She has never used smokeless tobacco.  She reports that she drinks alcohol. She reports that she does not use drugs. family history includes Alcohol abuse in her other; Arthritis in her other; Diabetes in her mother; Heart disease in her father; Stroke in her other. Allergies  Allergen Reactions  . Demerol [Meperidine] Other (See Comments)    Per pt: unknown  . Prednisone Itching and Rash   Current Outpatient Medications on File Prior to Visit  Medication Sig Dispense Refill  . Aspirin-Acetaminophen-Caffeine (EXCEDRIN MIGRAINE PO) Take by mouth as needed.    Marland Kitchen azelastine (OPTIVAR) 0.05 % ophthalmic solution INSTILL ONE DROP INTO EACH EYE TWICE DAILY 6 mL 12  . diphenhydrAMINE (BENADRYL) 25 MG tablet Take 25 mg by mouth daily as needed.    . gabapentin (NEURONTIN) 300 MG capsule Take 1 capsule (300 mg total) by mouth at bedtime. nightly 30 capsule 3  . glipiZIDE (GLIPIZIDE XL) 5 MG 24 hr tablet Take 1 tablet (5 mg total) by mouth 2 (two) times daily. 180 tablet 1  . glucose blood test strip 1 each by Other route as needed for other. RELION PRIME TEST STRIPS  Use as instructed    . lisinopril (PRINIVIL,ZESTRIL) 20 MG tablet Take 1 tablet (20 mg total) by mouth daily. 90 tablet 3  . lovastatin (MEVACOR) 40 MG tablet TAKE 1 TABLET BY MOUTH ONCE DAILY 90 tablet 3  . metFORMIN (GLUCOPHAGE) 1000 MG tablet TAKE 1 TABLET BY MOUTH TWICE DAILY  WITH MEALS 180 tablet 1  . phenylephrine (SUDAFED PE) 10 MG TABS tablet Take 10 mg by mouth daily.    Marland Kitchen. RELION LANCETS STANDARD 21G MISC by Does not apply route.    . terbinafine (LAMISIL) 250 MG tablet Take 1 tablet (250 mg total) by mouth daily. 30 tablet 2   No current facility-administered medications on file prior to visit.    Review of Systems Constitutional: Negative for other unusual diaphoresis, sweats, appetite or weight changes HENT: Negative for other worsening hearing loss, ear pain, facial swelling, mouth sores or neck stiffness.   Eyes: Negative for other worsening pain, redness or  other visual disturbance.  Respiratory: Negative for other stridor or swelling Cardiovascular: Negative for other palpitations or other chest pain  Gastrointestinal: Negative for worsening diarrhea or loose stools, blood in stool, distention or other pain Genitourinary: Negative for hematuria, flank pain or other change in urine volume.  Musculoskeletal: Negative for myalgias or other joint swelling.  Skin: Negative for other color change, or other wound or worsening drainage.  Neurological: Negative for other syncope or numbness. Hematological: Negative for other adenopathy or swelling Psychiatric/Behavioral: Negative for hallucinations, other worsening agitation, SI, self-injury, or new decreased concentration All other system neg per pt    Objective:   Physical Exam BP 136/84   Pulse (!) 104   Temp 97.9 F (36.6 C) (Oral)   Ht 5\' 2"  (1.575 m)   Wt 126 lb (57.2 kg)   SpO2 99%   BMI 23.05 kg/m  VS noted,  Constitutional: Pt is oriented to person, place, and time. Appears well-developed and well-nourished, in no significant distress and comfortable Head: Normocephalic and atraumatic  Eyes: Conjunctivae and EOM are normal. Pupils are equal, round, and reactive to light Right Ear: External ear normal without discharge Left Ear: External ear normal without discharge Nose: Nose without discharge or deformity Mouth/Throat: Oropharynx is without other ulcerations and moist  Neck: Normal range of motion. Neck supple. No JVD present. No tracheal deviation present or significant neck LA or mass Cardiovascular: Normal rate, regular rhythm, normal heart sounds and intact distal pulses.   Pulmonary/Chest: WOB normal and breath sounds without rales or wheezing  Abdominal: Soft. Bowel sounds are normal. NT. No HSM  Musculoskeletal: Normal range of motion. Exhibits no edema Lymphadenopathy: Has no other cervical adenopathy.  Neurological: Pt is alert and oriented to person, place, and time. Pt  has normal reflexes. No cranial nerve deficit. Motor grossly intact, Gait intact Skin: Skin is warm and dry. No rash noted or new ulcerations Psychiatric:  Has normal mood and affect. Behavior is normal without agitation No other exam findings  Lab Results  Component Value Date   WBC 11.0 (H) 07/01/2018   HGB 11.9 (L) 07/01/2018   HCT 36.4 07/01/2018   PLT 434.0 (H) 07/01/2018   GLUCOSE 166 (H) 07/01/2018   CHOL 139 04/08/2017   TRIG 111.0 04/08/2017   HDL 59.70 04/08/2017   LDLCALC 57 04/08/2017   ALT 10 07/01/2018   AST 9 07/01/2018   NA 139 07/01/2018   K 4.3 07/01/2018   CL 103 07/01/2018   CREATININE 0.65 07/01/2018   BUN 13 07/01/2018   CO2 29 07/01/2018   TSH 0.69 04/08/2017   HGBA1C 8.3 (H) 07/01/2018   MICROALBUR 1.4 04/08/2017       Assessment & Plan:

## 2018-08-27 ENCOUNTER — Encounter: Payer: Self-pay | Admitting: Internal Medicine

## 2018-08-28 DIAGNOSIS — M75121 Complete rotator cuff tear or rupture of right shoulder, not specified as traumatic: Secondary | ICD-10-CM | POA: Diagnosis not present

## 2018-09-01 DIAGNOSIS — G8918 Other acute postprocedural pain: Secondary | ICD-10-CM | POA: Diagnosis not present

## 2018-09-01 DIAGNOSIS — M7521 Bicipital tendinitis, right shoulder: Secondary | ICD-10-CM | POA: Diagnosis not present

## 2018-09-01 DIAGNOSIS — M7541 Impingement syndrome of right shoulder: Secondary | ICD-10-CM | POA: Diagnosis not present

## 2018-09-01 DIAGNOSIS — M75121 Complete rotator cuff tear or rupture of right shoulder, not specified as traumatic: Secondary | ICD-10-CM | POA: Diagnosis not present

## 2018-09-01 DIAGNOSIS — M19011 Primary osteoarthritis, right shoulder: Secondary | ICD-10-CM | POA: Diagnosis not present

## 2018-09-14 ENCOUNTER — Encounter: Payer: Self-pay | Admitting: Family Medicine

## 2018-09-14 DIAGNOSIS — M19011 Primary osteoarthritis, right shoulder: Secondary | ICD-10-CM | POA: Diagnosis not present

## 2018-09-17 ENCOUNTER — Ambulatory Visit: Payer: BLUE CROSS/BLUE SHIELD | Admitting: Family Medicine

## 2018-09-21 ENCOUNTER — Ambulatory Visit (INDEPENDENT_AMBULATORY_CARE_PROVIDER_SITE_OTHER): Payer: BLUE CROSS/BLUE SHIELD | Admitting: Rehabilitative and Restorative Service Providers"

## 2018-09-21 ENCOUNTER — Other Ambulatory Visit: Payer: Self-pay

## 2018-09-21 DIAGNOSIS — M25611 Stiffness of right shoulder, not elsewhere classified: Secondary | ICD-10-CM | POA: Diagnosis not present

## 2018-09-21 DIAGNOSIS — M6281 Muscle weakness (generalized): Secondary | ICD-10-CM

## 2018-09-21 DIAGNOSIS — R293 Abnormal posture: Secondary | ICD-10-CM

## 2018-09-21 DIAGNOSIS — M25511 Pain in right shoulder: Secondary | ICD-10-CM

## 2018-09-21 DIAGNOSIS — G8929 Other chronic pain: Secondary | ICD-10-CM

## 2018-09-21 DIAGNOSIS — R29898 Other symptoms and signs involving the musculoskeletal system: Secondary | ICD-10-CM | POA: Diagnosis not present

## 2018-09-21 NOTE — Patient Instructions (Addendum)
Bend and straighten elbow  Elbow bent at 90 degrees - turn palm up and down    .Axial Extension (Chin Tuck)    Pull chin in and lengthen back of neck. Hold __10__ seconds while counting out loud. Repeat __10__ times. Do __several__ sessions per day.  Shoulder Blade Squeeze    Rotate shoulders back, then squeeze shoulder blades down and back. Hold 10 sec Repeat __10_ times. Do __several __ sessions per day.   Side Bend, Sitting    Sit, head in comfortable, centered position, chin slightly tucked. Gently tilt head, bringing ear toward same-side shoulder. Hold __3-5_ seconds.  Repeat __3_ times per session. Do _2-3__ sessions per day.   AROM, Rotation    Sit or stand, head comfortable, centered position. Turn head slowly to look over one shoulder. Hold _3-5__ seconds. Repeat to other side. Repeat _3_ times per session. Do _2-3 sessions per day.   Pendulum Circular    Bend forward 90 at waist, leaning on table for support. Rock body in a circular pattern to move arm clockwise __20-30__ times then counterclockwise _20-30___ times. Do __3-4 __ sessions per day.      External Rotator Cuff Stretch, Supine (Passive)Standing    Lie supine, one elbow against ribs, and bent at 90, dowel in palm, other hand holding dowel up. Use other arm to push forearm toward floor. Keep elbow against side. Hold __5-10_ seconds. Repeat _5-10__ times per session. Do _2-3_ sessions per day.    External Rotator Cuff Stretch, Sitting (Passive) sit side to table     Sit with elbow bent, forearm on table, palm down. Bend forward at waist until a stretch is felt in shoulder. Hold _10__ seconds.  Repeat _5-10__ times per session. Do __2-3_ sessions per day.  [facility] [address] [place], [place] [address]  [phone] (office) [phone] (fax)

## 2018-09-21 NOTE — Therapy (Signed)
[facility] [address] [place], [place], [address] Phone: [phone]   Fax:  [phone]  Physical Therapy Evaluation  Patient Details  Name: Ruth Crawford MRN: [id number] Date of Birth: 06/20/1956 Referring Provider (PT): Dr Jones BroomJustin Chandler    Encounter Date: 09/21/2018  PT End of Session - 09/21/18 1340    Visit Number  1    Number of Visits  12    Date for PT Re-Evaluation  12/14/18    PT Start Time  0716    PT Stop Time  0815    PT Time Calculation (min)  59 min    Activity Tolerance  Patient tolerated treatment well       Past Medical History:  Diagnosis Date  . Allergy   . ANXIETY   . COMMON MIGRAINE   . DEPRESSION   . DIABETES MELLITUS, TYPE II   . GERD (gastroesophageal reflux disease) 10/17/2012  . HYPERLIPIDEMIA   . HYPERTENSION   . POLYP, GALLBLADDER   . Sickle cell trait [facility](HCC)     Past Surgical History:  Procedure Laterality Date  . ABDOMINAL HYSTERECTOMY  2000  . COLONOSCOPY    . LUMBAR DISC SURGERY  1998, 2016, 2017   s/p    There were no vitals filed for this visit.   Subjective Assessment - 09/21/18 0723    Subjective  Patient reports that she has had Rt shoulder pain for ~ 1 year. She was seen for PT with some improvement but symptoms increased again. She has difficulty reaching and using Rt UE fof functional activities. She underwent Rt RCR 09/01/18. sutures removed and she is healing well.     Pertinent History  AODM; HTN' 3 back surgeries 2016 and 2017    Diagnostic tests  MRI; xray    Patient Stated Goals  use arm normally     Currently in Pain?  No/denies    Pain Location  Shoulder    Pain Orientation  Right    Pain Descriptors / Indicators  Dull;Throbbing    Pain Type  Surgical pain;Chronic pain    Pain Radiating Towards  into Rt forearm     Pain Onset  1 to 4 weeks ago    Pain Frequency  Intermittent    Aggravating Factors   movement; dressing; sometimes just sitting still      Pain Relieving Factors  meds; ice          [facility] - 09/21/18 0001      Assessment   Medical Diagnosis  Rt RCR    Referring Provider (PT)  Dr Jones BroomJustin Chandler     Onset Date/Surgical Date  09/01/18    Hand Dominance  Right    Next MD Visit  09/2018    Prior Therapy  yes here - 5/19      Precautions   Precaution Comments  post op precautions       Balance Screen   Has the patient fallen in the past 6 months  No    Has the patient had a decrease in activity level because of a fear of falling?   No    Is the patient reluctant to leave their home because of a fear of falling?   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    Leisure  household chores; errands       Observation/Other Assessments   Focus on Therapeutic Outcomes (FOTO)   96% limitation       Observation/Other Assessments-Edema    Edema  --   edema Rt forearm and hand      Sensation   Additional Comments  intermittent numbness in Rt hand       Posture/Postural Control   Posture Comments  head forward; shoudlers rounded and elevated       AROM   Left Shoulder Extension  58 Degrees    Left Shoulder Flexion  135 Degrees    Left Shoulder ABduction  149 Degrees    Left Shoulder Internal Rotation  33 Degrees    Left Shoulder External Rotation  85 Degrees    Cervical Flexion  45    Cervical Extension  43    Cervical - Right Side Bend  32    Cervical - Left Side Bend  22    Cervical - Right Rotation  58    Cervical - Left Rotation  70      Strength   Right/Left Shoulder  --   Lt shoulder strength WFL's      Palpation   Spinal mobility  hypomobile thoracic and  cervical spine with Cpa mobs     Palpation comment  tight Rt pecs; upper trap; leveator; teres; biceps                 Objective measurements completed on examination: See above findings.      [facility] Adult PT Treatment/Exercise - 09/21/18 0001      Neuro Re-ed    Neuro Re-ed Details   workingo n posture and alignment; scapular positioning       Shoulder Exercises: Standing   Other Standing Exercises  axial extension 10 sec x 5; scap retraction with swim noodle 10 swec x 10       Shoulder Exercises: ROM/Strengthening   Pendulum  20 CW/20 CCW       Shoulder Exercises: Stretch   External Rotation Stretch  3 reps;10 seconds   standing with cane - elbow at side 90 deg flexion    Table Stretch - Flexion  5 reps;10 seconds   table at side      Vasopneumatic   Number Minutes Vasopneumatic   15 minutes    Vasopnuematic Location   Shoulder   Rt    Vasopneumatic Pressure  Low    Vasopneumatic Temperature   34 deg              PT Education - 09/21/18 0807    Education Details  HEP     Person(s) Educated  Patient    Methods  Explanation;Demonstration;Tactile cues;Verbal cues;Handout    Comprehension  Verbalized understanding;Returned demonstration;Verbal cues required;Tactile cues required       PT Short Term Goals - 09/21/18 1347      PT SHORT TERM GOAL #1   Title  Pt will be independent in inital HEP per protocol 11/02/2018    Time  6    Period  Weeks    Status  New      PT SHORT TERM GOAL #2  Title  Improve posture and alignment with patient to demonstrate improve upright posture and good position of scapulae along thoracic wall 11/02/2018    Time  6    Period  Weeks    Status  New      PT SHORT TERM GOAL #3   Title  PROM Rt shoulder gradually restored to [facility] 11/02/2018    Time  6    Period  Weeks    Status  New        PT Long Term Goals - 09/21/18 1350      PT LONG TERM GOAL #1   Title  Increase AROM Rt shoulder to within 5-10 deg of AROM Lt  shoulder 12/14/2018    Time  12    Period  Weeks    Status  New      PT LONG TERM GOAL #2   Title  Return to light functional tasks using Rt UE 12/14/2018    Time  12    Period  Weeks    Status  New      PT LONG TERM GOAL #3   Title  Pt will report minimal to no pain with ADL's (with no lifting per protocol) 12/14/2018    Time  12    Period  Weeks    Status  New      PT LONG TERM GOAL #4   Title  Indpendent in HEP 12/14/2018    Time  12    Period  Weeks    Status  New      PT LONG TERM GOAL #5   Title  Improve FOTO to </= 48% limitation 12/14/2018    Time  12    Period  Weeks    Status  New             Plan - 09/21/18 1340    Clinical Impression Statement  Patient presents s/p Rt RCR 09/01/18. She continues to be in a sling until MD visit. Patient has poor posture and alignment; limited ROM, strength and function Rt UE; pain limiting rest and function. Patient will benefit from PT to address problems identified and return to normal functional use of Rt UE.     History and Personal Factors relevant to plan of care:  long standing pain and dysfunction Rt shoulder; AODM; limited ROM prior to surgery     Clinical Presentation  Evolving    Clinical Presentation due to:  poor posture    Clinical Decision Making  Low    Rehab Potential  Good    PT Frequency  2x / week    PT Duration  12 weeks    PT Treatment/Interventions  Patient/family education;ADLs/Self Care Home Management;Cryotherapy;Electrical Stimulation;Iontophoresis 4mg /ml Dexamethasone;Moist Heat;Ultrasound;Passive range of motion;Neuromuscular re-education;Manual techniques;Therapeutic activities;Therapeutic exercise    PT Next Visit Plan  review HEP; progress with shoulder rehab per protocol - add PROM by PT/PTA next visit; manual work to address muscular tightness Rt shoulder girdle; modalities as indicated     Consulted and Agree with Plan of Care  Patient;Family member/caregiver    Family Member Consulted  husband  Thayer Ohm        Patient will benefit from skilled therapeutic intervention in order to improve the following deficits and impairments:  Postural dysfunction, Improper body mechanics, Pain, Increased fascial restricitons, Increased muscle spasms, Decreased mobility, Decreased range of motion, Decreased strength, Decreased activity tolerance  Visit Diagnosis: Chronic right shoulder pain - Plan: PT plan of care cert/re-cert  Muscle weakness (  generalized) - Plan: PT plan of care cert/re-cert  Stiffness of right shoulder, not elsewhere classified - Plan: PT plan of care cert/re-cert  Other symptoms and signs involving the musculoskeletal system - Plan: PT plan of care cert/re-cert  Abnormal posture - Plan: PT plan of care cert/re-cert     Problem List Patient Active Problem List   Diagnosis Date Noted  . Lumbar disc disease 08/26/2018  . Preop exam for internal medicine 08/26/2018  . Dizziness 07/01/2018  . Nail, ingrown 04/13/2018  . Onychomycosis 04/13/2018  . 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/02/2018  . Right rotator cuff tear 01/05/2018  . Right cervical radiculopathy 12/12/2017  . Acute shoulder bursitis, right 12/12/2017  . Seasonal allergic rhinitis due to pollen 04/17/2017  . 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 Left-sided face pain 03/26/2017  . TMJ tenderness, left 02/05/2017  . Anemia 08/30/2016  . Parotitis, acute 03/26/2016  . Coccygeal pain, acute 02/28/2016  . Rash 10/18/2015  . Urine frequency 05/30/2015  . Lower back pain 05/30/2015  . Allergic conjunctivitis 03/02/2015  . Bursitis of right shoulder 05/09/2014  . Acute bronchitis 03/23/2014  . Seasonal allergic conjunctivitis 01/01/2014  . Food allergy 01/01/2014  . Conjunctivitis 09/12/2013  . Seasonal and perennial allergic rhinitis 09/12/2013  . Abdominal pain, other specified site 03/18/2013  . GERD (gastroesophageal reflux disease) 10/17/2012  . Encounter for well adult exam with abnormal  findings 04/07/2012  . POLYP, GALLBLADDER 11/30/2010  . SHOULDER PAIN, RIGHT 11/30/2010  . CERVICAL RADICULOPATHY, RIGHT 11/30/2010  . DEPRESSION 03/26/2010  . Headache(784.0) 03/26/2010  . SYNCOPE 06/07/2009  . Diabetes (HCC) 10/29/2007  . Hyperlipidemia 10/29/2007  . Anxiety state 10/29/2007  . COMMON MIGRAINE 10/29/2007  . Essential hypertension 10/29/2007  . BACK PAIN 10/29/2007    Brad Lieurance Rober Minion PT, MPH  09/21/2018, 1:56 PM  [facility] [address] [place], [place], [address] Phone: [phone]   Fax:  [phone]  Name: Ruth Crawford MRN: [id number] Date of Birth: [birth year]

## 2018-09-24 ENCOUNTER — Ambulatory Visit (INDEPENDENT_AMBULATORY_CARE_PROVIDER_SITE_OTHER): Payer: BLUE CROSS/BLUE SHIELD | Admitting: Rehabilitative and Restorative Service Providers"

## 2018-09-24 ENCOUNTER — Encounter: Payer: Self-pay | Admitting: Rehabilitative and Restorative Service Providers"

## 2018-09-24 DIAGNOSIS — M25511 Pain in right shoulder: Secondary | ICD-10-CM

## 2018-09-24 DIAGNOSIS — R29898 Other symptoms and signs involving the musculoskeletal system: Secondary | ICD-10-CM

## 2018-09-24 DIAGNOSIS — M25611 Stiffness of right shoulder, not elsewhere classified: Secondary | ICD-10-CM

## 2018-09-24 DIAGNOSIS — M6281 Muscle weakness (generalized): Secondary | ICD-10-CM

## 2018-09-24 DIAGNOSIS — G8929 Other chronic pain: Secondary | ICD-10-CM

## 2018-09-24 DIAGNOSIS — R293 Abnormal posture: Secondary | ICD-10-CM

## 2018-09-24 NOTE — Therapy (Signed)
[facility] [address] [place], [place], [address] Phone: [phone]   Fax:  [phone]  Physical Therapy Treatment  Patient Details  Name: Ruth PlaterSandy L [place] MRN: [id number] Date of Birth: 03/09/1956 Referring Provider (PT): Dr Jones BroomJustin [place]    Encounter Date: 09/24/2018  PT End of Session - 09/24/18 1406    Visit Number  2    Number of Visits  12    Date for PT Re-Evaluation  12/14/18    PT Start Time  1403    PT Stop Time  1453    PT Time Calculation (min)  50 min    Activity Tolerance  Patient tolerated treatment well       Past Medical History:  Diagnosis Date  . Allergy   . ANXIETY   . COMMON MIGRAINE   . DEPRESSION   . DIABETES MELLITUS, TYPE II   . GERD (gastroesophageal reflux disease) 10/17/2012  . HYPERLIPIDEMIA   . HYPERTENSION   . POLYP, GALLBLADDER   . Sickle cell trait [facility](HCC)     Past Surgical History:  Procedure Laterality Date  . ABDOMINAL HYSTERECTOMY  2000  . COLONOSCOPY    . LUMBAR DISC SURGERY  1998, 2016, 2017   s/p    There were no vitals filed for this visit.  Subjective Assessment - 09/24/18 1407    Subjective  doing well with exercises at home - less pain in general. Just hurts when she moves the arm or bumps it.     Currently in Pain?  No/denies         [facility] - 09/24/18 0001      Assessment   Medical Diagnosis  Rt RCR    Referring Provider (PT)  Dr Jones BroomJustin [place]     Onset Date/Surgical Date  09/01/18    Hand Dominance  Right    Next MD Visit  09/2018    Prior Therapy  yes here - 5/19      PROM   Right/Left Shoulder  --   PROM Rt shoudler w/ pt supine - shd in scapular plane    Right Shoulder Flexion  82 Degrees    Right Shoulder ABduction  70 Degrees   scaption    Right Shoulder External Rotation  20 Degrees   per protocol                   [facility] Adult PT Treatment/Exercise - 09/24/18 0001      Shoulder Exercises: Standing   Other  Standing Exercises  axial extension 10 sec x 5; scap retraction with noodle 10 swec x 10       Shoulder Exercises: ROM/Strengthening   Pendulum  20 CW/20 CCW x 2 sets       Shoulder Exercises: Stretch   External Rotation Stretch  5 reps;10 seconds   standing with cane - elbow at side 90 deg flexion    Table Stretch - Flexion  5 reps;10 seconds   table at side      Moist Heat Therapy   Number Minutes Moist Heat  15 Minutes    Moist Heat Location  Shoulder   Lt - patient cold with vaso      Vasopneumatic   Number Minutes Vasopneumatic   15 minutes    Vasopnuematic Location   Shoulder   Rt    Vasopneumatic Pressure  Low    Vasopneumatic Temperature   34 deg       Manual Therapy  Manual therapy comments  pt supine     Soft tissue mobilization  soft tissue work through the [employer]; trap; teres     Scapular Mobilization  gentle Rt scapular motion     Passive ROM  Lt shoulder flexion; scaption; ER within allowed ROM per protocol                PT Short Term Goals - 09/21/18 1347      PT SHORT TERM GOAL #1   Title  Pt will be independent in inital HEP per protocol 11/02/2018    Time  6    Period  Weeks    Status  New      PT SHORT TERM GOAL #2   Title  Improve posture and alignment with patient to demonstrate improve upright posture and good position of scapulae along thoracic wall 11/02/2018    Time  6    Period  Weeks    Status  New      PT SHORT TERM GOAL #3   Title  PROM Rt shoulder gradually restored to [facility] 11/02/2018    Time  6    Period  Weeks    Status  New        PT Long Term Goals - 09/21/18 1350      PT LONG TERM GOAL #1   Title  Increase AROM Rt shoulder to within 5-10 deg of AROM Lt shoulder 12/14/2018    Time  12    Period  Weeks    Status  New      PT LONG TERM GOAL #2   Title  Return to light functional tasks using Rt UE 12/14/2018    Time  12    Period  Weeks    Status  New      PT LONG TERM GOAL #3   Title  Pt will report minimal to  no pain with ADL's (with no lifting per protocol) 12/14/2018    Time  12    Period  Weeks    Status  New      PT LONG TERM GOAL #4   Title  Indpendent in HEP 12/14/2018    Time  12    Period  Weeks    Status  New      PT LONG TERM GOAL #5   Title  Improve FOTO to </= 48% limitation 12/14/2018    Time  12    Period  Weeks    Status  New            Plan - 09/24/18 1407    Clinical Impression Statement  Patient working consistently on exercises at home.  Added manual work for Rt shoulder girdle and PROM by PT in supine without difficulty.     Rehab Potential  Good    PT Frequency  2x / week    PT Duration  12 weeks    PT Treatment/Interventions  Patient/family education;ADLs/Self Care Home Management;Cryotherapy;Electrical Stimulation;Iontophoresis 4mg /ml Dexamethasone;Moist Heat;Ultrasound;Passive range of motion;Neuromuscular re-education;Manual techniques;Therapeutic activities;Therapeutic exercise    PT Next Visit Plan  review HEP; progress with shoulder rehab per protocol - continue PROM by PT/PTA next visit; manual work to address muscular tightness Rt shoulder girdle; modalities as indicated     Consulted and Agree with Plan of Care  Patient;Family member/caregiver    Family Member Consulted  husband Thayer Ohm        Patient will benefit from skilled therapeutic intervention in order to improve the following  deficits and impairments:  Postural dysfunction, Improper body mechanics, Pain, Increased fascial restricitons, Increased muscle spasms, Decreased mobility, Decreased range of motion, Decreased strength, Decreased activity tolerance  Visit Diagnosis: Chronic right shoulder pain  Muscle weakness (generalized)  Stiffness of right shoulder, not elsewhere classified  Other symptoms and signs involving the musculoskeletal system  Abnormal posture  Acute pain of right shoulder     Problem List Patient Active Problem List   Diagnosis Date Noted  . Lumbar disc  disease 08/26/2018  . Preop exam for internal medicine 08/26/2018  . Dizziness 07/01/2018  . Nail, ingrown 04/13/2018  . Onychomycosis 04/13/2018  . 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/02/2018  . Right rotator cuff tear 01/05/2018  . Right cervical radiculopathy 12/12/2017  . Acute shoulder bursitis, right 12/12/2017  . Seasonal allergic rhinitis due to pollen 04/17/2017  . 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 Left-sided face pain 03/26/2017  . TMJ tenderness, left 02/05/2017  . Anemia 08/30/2016  . Parotitis, acute 03/26/2016  . Coccygeal pain, acute 02/28/2016  . Rash 10/18/2015  . Urine frequency 05/30/2015  . Lower back pain 05/30/2015  . Allergic conjunctivitis 03/02/2015  . Bursitis of right shoulder 05/09/2014  . Acute bronchitis 03/23/2014  . Seasonal allergic conjunctivitis 01/01/2014  . Food allergy 01/01/2014  . Conjunctivitis 09/12/2013  . Seasonal and perennial allergic rhinitis 09/12/2013  . Abdominal pain, other specified site 03/18/2013  . GERD (gastroesophageal reflux disease) 10/17/2012  . Encounter for well adult exam with abnormal findings 04/07/2012  . POLYP, GALLBLADDER 11/30/2010  . SHOULDER PAIN, RIGHT 11/30/2010  . CERVICAL RADICULOPATHY, RIGHT 11/30/2010  . DEPRESSION 03/26/2010  . Headache(784.0) 03/26/2010  . SYNCOPE 06/07/2009  . Diabetes (HCC) 10/29/2007  . Hyperlipidemia 10/29/2007  . Anxiety state 10/29/2007  . COMMON MIGRAINE 10/29/2007  . Essential hypertension 10/29/2007  . BACK PAIN 10/29/2007    Cleophas Yoak Rober Minion PT, MPH  09/24/2018, 2:39 PM  [facility] [address] [place], [place], [address] Phone: [phone]   Fax:  [phone]  Name: HARLO ERVINE MRN: [id number] Date of Birth: 1955/10/05

## 2018-09-28 ENCOUNTER — Ambulatory Visit (INDEPENDENT_AMBULATORY_CARE_PROVIDER_SITE_OTHER): Payer: BLUE CROSS/BLUE SHIELD | Admitting: Physical Therapy

## 2018-09-28 DIAGNOSIS — M25611 Stiffness of right shoulder, not elsewhere classified: Secondary | ICD-10-CM

## 2018-09-28 DIAGNOSIS — M25511 Pain in right shoulder: Secondary | ICD-10-CM | POA: Diagnosis not present

## 2018-09-28 DIAGNOSIS — M6281 Muscle weakness (generalized): Secondary | ICD-10-CM | POA: Diagnosis not present

## 2018-09-28 DIAGNOSIS — R29898 Other symptoms and signs involving the musculoskeletal system: Secondary | ICD-10-CM | POA: Diagnosis not present

## 2018-09-28 DIAGNOSIS — G8929 Other chronic pain: Secondary | ICD-10-CM

## 2018-09-28 NOTE — Therapy (Signed)
[facility] [place] [address] [place] [address] [place], [place], [address] Phone: [phone]   Fax:  [phone]  Physical Therapy Treatment  Patient Details  Name: Ruth Crawford MRN: [id number] Date of Birth: May 24, 1956 Referring Provider (PT): Dr Jones Broom    Encounter Date: 09/28/2018  PT End of Session - 09/28/18 0819    Visit Number  3    Number of Visits  12    Date for PT Re-Evaluation  12/14/18    PT Start Time  0805    PT Stop Time  0857    PT Time Calculation (min)  52 min       Past Medical History:  Diagnosis Date  . Allergy   . ANXIETY   . COMMON MIGRAINE   . DEPRESSION   . DIABETES MELLITUS, TYPE II   . GERD (gastroesophageal reflux disease) 10/17/2012  . HYPERLIPIDEMIA   . HYPERTENSION   . POLYP, GALLBLADDER   . Sickle cell trait [facility])     Past Surgical History:  Procedure Laterality Date  . ABDOMINAL HYSTERECTOMY  2000  . COLONOSCOPY    . LUMBAR DISC SURGERY  1998, 2016, 2017   s/p    There were no vitals filed for this visit.  Subjective Assessment - 09/28/18 0826    Subjective  Pt reports no new changes since last visit.     Patient Stated Goals  use arm normally     Currently in Pain?  Yes    Pain Score  3     Pain Location  Shoulder    Pain Orientation  Right    Pain Descriptors / Indicators  Aching    Aggravating Factors   movement in the wrong way.          [facility] PT Assessment - 09/28/18 0001      Assessment   Medical Diagnosis  Rt RCR    Referring Provider (PT)  Dr Jones Broom     Onset Date/Surgical Date  09/01/18    Hand Dominance  Right    Next MD Visit  09/2018    Prior Therapy  yes here - 5/19        [facility] Adult PT Treatment/Exercise - 09/28/18 0001      Exercises   Exercises  Wrist;Elbow;Shoulder      Elbow Exercises   Elbow Flexion  Right;10 reps;AROM      Shoulder Exercises: Seated   Other Seated Exercises  shoulder rolls x 15 forward/ 15 backward      Shoulder  Exercises: Standing   Other Standing Exercises  axial extension 10 sec x 5; scap retraction with noodle 10 sec x 10       Shoulder Exercises: ROM/Strengthening   Pendulum  20 CW/20 CCW- multiple cues for more relaxed form; front to back pendulum with cues x 10       Shoulder Exercises: Stretch   Table Stretch - Flexion  5 reps;10 seconds   table at side    Table Stretch -Flexion Limitations  cues to keep Rt arm passive      Wrist Exercises   Wrist Flexion  AROM;Right;15 reps    Wrist Extension  AROM;Right;15 reps    Other wrist exercises  forearm supination/pronation x 15 reps     Other wrist exercises  hand grip exercises with stress ball x 20 (Rt hand)      Moist Heat Therapy   Number Minutes Moist Heat  15 Minutes    Moist  Heat Location  Shoulder   Lt - patient cold with vaso      Vasopneumatic   Number Minutes Vasopneumatic   15 minutes    Vasopnuematic Location   Shoulder   Rt    Vasopneumatic Pressure  Low    Vasopneumatic Temperature   34 deg       Manual Therapy   Manual therapy comments  pt supine     Soft tissue mobilization  soft tissue work through the [employer]; trap; teres     Scapular Mobilization  gentle Rt scapular motion     Passive ROM  Lt shoulder flexion; scaption; ER within allowed ROM per protocol                PT Short Term Goals - 09/21/18 1347      PT SHORT TERM GOAL #1   Title  Pt will be independent in inital HEP per protocol 11/02/2018    Time  6    Period  Weeks    Status  New      PT SHORT TERM GOAL #2   Title  Improve posture and alignment with patient to demonstrate improve upright posture and good position of scapulae along thoracic wall 11/02/2018    Time  6    Period  Weeks    Status  New      PT SHORT TERM GOAL #3   Title  PROM Rt shoulder gradually restored to [facility] 11/02/2018    Time  6    Period  Weeks    Status  New        PT Long Term Goals - 09/21/18 1350      PT LONG TERM GOAL #1   Title  Increase AROM Rt  shoulder to within 5-10 deg of AROM Lt shoulder 12/14/2018    Time  12    Period  Weeks    Status  New      PT LONG TERM GOAL #2   Title  Return to light functional tasks using Rt UE 12/14/2018    Time  12    Period  Weeks    Status  New      PT LONG TERM GOAL #3   Title  Pt will report minimal to no pain with ADL's (with no lifting per protocol) 12/14/2018    Time  12    Period  Weeks    Status  New      PT LONG TERM GOAL #4   Title  Indpendent in HEP 12/14/2018    Time  12    Period  Weeks    Status  New      PT LONG TERM GOAL #5   Title  Improve FOTO to </= 48% limitation 12/14/2018    Time  12    Period  Weeks    Status  New            Plan - 09/28/18 1329    Clinical Impression Statement  Pt will be 4 wks s/p RTC repair tomorrow.  Pt tolerated new AROM exercises for Rt hand,wrist, forearm, elbow, without difficulty.  Pt somewhat guarded with pendulum exercise as well has PROM.  Goals are ongoing at this time.     Rehab Potential  Good    PT Frequency  2x / week    PT Duration  12 weeks    PT Treatment/Interventions  Patient/family education;ADLs/Self Care Home Management;Cryotherapy;Electrical Stimulation;Iontophoresis 4mg /ml Dexamethasone;Moist Heat;Ultrasound;Passive range of motion;Neuromuscular re-education;Manual  techniques;Therapeutic activities;Therapeutic exercise    PT Next Visit Plan  progress with shoulder rehab per protocol    Consulted and Agree with Plan of Care  Patient       Patient will benefit from skilled therapeutic intervention in order to improve the following deficits and impairments:  Postural dysfunction, Improper body mechanics, Pain, Increased fascial restricitons, Increased muscle spasms, Decreased mobility, Decreased range of motion, Decreased strength, Decreased activity tolerance  Visit Diagnosis: Chronic right shoulder pain  Muscle weakness (generalized)  Stiffness of right shoulder, not elsewhere classified  Other symptoms and  signs involving the musculoskeletal system     Problem List Patient Active Problem List   Diagnosis Date Noted  . Lumbar disc disease 08/26/2018  . Preop exam for internal medicine 08/26/2018  . Dizziness 07/01/2018  . Nail, ingrown 04/13/2018  . Onychomycosis 04/13/2018  . 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/02/2018  . Right rotator cuff tear 01/05/2018  . Right cervical radiculopathy 12/12/2017  . Acute shoulder bursitis, right 12/12/2017  . Seasonal allergic rhinitis due to pollen 04/17/2017  . 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 Left-sided face pain 03/26/2017  . TMJ tenderness, left 02/05/2017  . Anemia 08/30/2016  . Parotitis, acute 03/26/2016  . Coccygeal pain, acute 02/28/2016  . Rash 10/18/2015  . Urine frequency 05/30/2015  . Lower back pain 05/30/2015  . Allergic conjunctivitis 03/02/2015  . Bursitis of right shoulder 05/09/2014  . Acute bronchitis 03/23/2014  . Seasonal allergic conjunctivitis 01/01/2014  . Food allergy 01/01/2014  . Conjunctivitis 09/12/2013  . Seasonal and perennial allergic rhinitis 09/12/2013  . Abdominal pain, other specified site 03/18/2013  . GERD (gastroesophageal reflux disease) 10/17/2012  . Encounter for well adult exam with abnormal findings 04/07/2012  . POLYP, GALLBLADDER 11/30/2010  . SHOULDER PAIN, RIGHT 11/30/2010  . CERVICAL RADICULOPATHY, RIGHT 11/30/2010  . DEPRESSION 03/26/2010  . Headache(784.0) 03/26/2010  . SYNCOPE 06/07/2009  . Diabetes (HCC) 10/29/2007  . Hyperlipidemia 10/29/2007  . Anxiety state 10/29/2007  . COMMON MIGRAINE 10/29/2007  . Essential hypertension 10/29/2007  . BACK PAIN 10/29/2007   Mayer CamelJennifer Carlson-Long, PTA 09/28/18 1:32 PM  [facility] [address] [place], [place], [address] Phone: [phone]   Fax:  [phone]  Name: Ruth [place] MRN: [id number] Date of Birth: 11/22/1955

## 2018-10-01 ENCOUNTER — Ambulatory Visit (INDEPENDENT_AMBULATORY_CARE_PROVIDER_SITE_OTHER): Payer: BLUE CROSS/BLUE SHIELD | Admitting: Rehabilitative and Restorative Service Providers"

## 2018-10-01 ENCOUNTER — Encounter: Payer: Self-pay | Admitting: Rehabilitative and Restorative Service Providers"

## 2018-10-01 DIAGNOSIS — M25611 Stiffness of right shoulder, not elsewhere classified: Secondary | ICD-10-CM | POA: Diagnosis not present

## 2018-10-01 DIAGNOSIS — R293 Abnormal posture: Secondary | ICD-10-CM

## 2018-10-01 DIAGNOSIS — M25511 Pain in right shoulder: Secondary | ICD-10-CM

## 2018-10-01 DIAGNOSIS — M6281 Muscle weakness (generalized): Secondary | ICD-10-CM

## 2018-10-01 DIAGNOSIS — G8929 Other chronic pain: Secondary | ICD-10-CM

## 2018-10-01 DIAGNOSIS — R29898 Other symptoms and signs involving the musculoskeletal system: Secondary | ICD-10-CM

## 2018-10-01 NOTE — Therapy (Signed)
[facility] [address] [place] [address] [place], [place], [address] Phone: [phone]   Fax:  [phone]  Physical Therapy Treatment  Patient Details  Name: Ruth PlaterSandy L Wesenberg MRN: [id number] Date of Birth: 08/05/1956 Referring Provider (PT): Dr Jones BroomJustin [place]    Encounter Date: 10/01/2018  PT End of Session - 10/01/18 1407    Visit Number  4    Number of Visits  12    Date for PT Re-Evaluation  12/14/18    PT Start Time  1405    PT Stop Time  1500    PT Time Calculation (min)  55 min    Activity Tolerance  Patient tolerated treatment well       Past Medical History:  Diagnosis Date  . Allergy   . ANXIETY   . COMMON MIGRAINE   . DEPRESSION   . DIABETES MELLITUS, TYPE II   . GERD (gastroesophageal reflux disease) 10/17/2012  . HYPERLIPIDEMIA   . HYPERTENSION   . POLYP, GALLBLADDER   . Sickle cell trait [facility](HCC)     Past Surgical History:  Procedure Laterality Date  . ABDOMINAL HYSTERECTOMY  2000  . COLONOSCOPY    . LUMBAR DISC SURGERY  1998, 2016, 2017   s/p    There were no vitals filed for this visit.  Subjective Assessment - 10/01/18 1410    Subjective  No pain - shoulder is doing well. Sees the doctor in another week or so..     Currently in Pain?  No/denies    Pain Score  0-No pain                       [facility] Adult PT Treatment/Exercise - 10/01/18 0001      Elbow Exercises   Elbow Flexion  Right;10 reps;AROM      Shoulder Exercises: Seated   Other Seated Exercises  shoulder rolls x 15 forward/ 15 backward      Shoulder Exercises: Standing   Other Standing Exercises  axial extension 10 sec x 5; scap retraction with noodle 10 sec x 10       Shoulder Exercises: Stretch   External Rotation Stretch  10 seconds   10 reps standing with cane elbow at 90 deg flexion    Table Stretch - Flexion  5 reps;10 seconds   table at side      Wrist Exercises   Wrist Flexion  AROM;Right;15 reps    Wrist  Extension  AROM;Right;15 reps    Other wrist exercises  forearm supination/pronation x 15 reps       Moist Heat Therapy   Number Minutes Moist Heat  15 Minutes    Moist Heat Location  Shoulder   Lt - patient cold with vaso      Vasopneumatic   Number Minutes Vasopneumatic   15 minutes    Vasopnuematic Location   Shoulder   Rt    Vasopneumatic Pressure  Low    Vasopneumatic Temperature   34 deg       Manual Therapy   Manual therapy comments  pt supine     Soft tissue mobilization  soft tissue work through the [employer]; trap; teres     Scapular Mobilization  gentle Rt scapular motion     Passive ROM  Lt shoulder flexion; scaption; ER within allowed ROM per protocol                PT Short Term Goals - 09/21/18 1347  PT SHORT TERM GOAL #1   Title  Pt will be independent in inital HEP per protocol 11/02/2018    Time  6    Period  Weeks    Status  New      PT SHORT TERM GOAL #2   Title  Improve posture and alignment with patient to demonstrate improve upright posture and good position of scapulae along thoracic wall 11/02/2018    Time  6    Period  Weeks    Status  New      PT SHORT TERM GOAL #3   Title  PROM Rt shoulder gradually restored to [facility] 11/02/2018    Time  6    Period  Weeks    Status  New        PT Long Term Goals - 09/21/18 1350      PT LONG TERM GOAL #1   Title  Increase AROM Rt shoulder to within 5-10 deg of AROM Lt shoulder 12/14/2018    Time  12    Period  Weeks    Status  New      PT LONG TERM GOAL #2   Title  Return to light functional tasks using Rt UE 12/14/2018    Time  12    Period  Weeks    Status  New      PT LONG TERM GOAL #3   Title  Pt will report minimal to no pain with ADL's (with no lifting per protocol) 12/14/2018    Time  12    Period  Weeks    Status  New      PT LONG TERM GOAL #4   Title  Indpendent in HEP 12/14/2018    Time  12    Period  Weeks    Status  New      PT LONG TERM GOAL #5   Title  Improve FOTO to  </= 48% limitation 12/14/2018    Time  12    Period  Weeks    Status  New            Plan - 10/01/18 1407    Clinical Impression Statement  Patient continues to do well with shoulder rehab within parameters of protocol. She will not progress with additioinal exercises until week 6. We will decrease treatment to 1x/wk next week then reassess frequency as protocol changes.     Rehab Potential  Good    PT Frequency  2x / week    PT Duration  12 weeks    PT Treatment/Interventions  Patient/family education;ADLs/Self Care Home Management;Cryotherapy;Electrical Stimulation;Iontophoresis 4mg /ml Dexamethasone;Moist Heat;Ultrasound;Passive range of motion;Neuromuscular re-education;Manual techniques;Therapeutic activities;Therapeutic exercise    PT Next Visit Plan  progress with shoulder rehab per protocol    Consulted and Agree with Plan of Care  Patient       Patient will benefit from skilled therapeutic intervention in order to improve the following deficits and impairments:  Postural dysfunction, Improper body mechanics, Pain, Increased fascial restricitons, Increased muscle spasms, Decreased mobility, Decreased range of motion, Decreased strength, Decreased activity tolerance  Visit Diagnosis: Chronic right shoulder pain  Muscle weakness (generalized)  Stiffness of right shoulder, not elsewhere classified  Other symptoms and signs involving the musculoskeletal system  Abnormal posture  Acute pain of right shoulder     Problem List Patient Active Problem List   Diagnosis Date Noted  . Lumbar disc disease 08/26/2018  . Preop exam for internal medicine 08/26/2018  . Dizziness 07/01/2018  .  Nail, ingrown 04/13/2018  . Onychomycosis 04/13/2018  . 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/02/2018  . Right rotator cuff tear 01/05/2018  . Right cervical radiculopathy 12/12/2017  . Acute shoulder bursitis, right 12/12/2017  . Seasonal allergic rhinitis due to pollen 04/17/2017  .  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 Left-sided face pain 03/26/2017  . TMJ tenderness, left 02/05/2017  . Anemia 08/30/2016  . Parotitis, acute 03/26/2016  . Coccygeal pain, acute 02/28/2016  . Rash 10/18/2015  . Urine frequency 05/30/2015  . Lower back pain 05/30/2015  . Allergic conjunctivitis 03/02/2015  . Bursitis of right shoulder 05/09/2014  . Acute bronchitis 03/23/2014  . Seasonal allergic conjunctivitis 01/01/2014  . Food allergy 01/01/2014  . Conjunctivitis 09/12/2013  . Seasonal and perennial allergic rhinitis 09/12/2013  . Abdominal pain, other specified site 03/18/2013  . GERD (gastroesophageal reflux disease) 10/17/2012  . Encounter for well adult exam with abnormal findings 04/07/2012  . POLYP, GALLBLADDER 11/30/2010  . SHOULDER PAIN, RIGHT 11/30/2010  . CERVICAL RADICULOPATHY, RIGHT 11/30/2010  . DEPRESSION 03/26/2010  . Headache(784.0) 03/26/2010  . SYNCOPE 06/07/2009  . Diabetes (HCC) 10/29/2007  . Hyperlipidemia 10/29/2007  . Anxiety state 10/29/2007  . COMMON MIGRAINE 10/29/2007  . Essential hypertension 10/29/2007  . BACK PAIN 10/29/2007    Val Riles PT MPH  10/01/2018, 2:48 PM  [facility] [address] [place] [address] [place], [place], [address] Phone: [phone]   Fax:  [phone]  Name: ECKO BANGS MRN: [id number] Date of Birth: 04-04-1956

## 2018-10-05 ENCOUNTER — Ambulatory Visit (INDEPENDENT_AMBULATORY_CARE_PROVIDER_SITE_OTHER): Payer: BLUE CROSS/BLUE SHIELD | Admitting: Family Medicine

## 2018-10-05 ENCOUNTER — Ambulatory Visit (INDEPENDENT_AMBULATORY_CARE_PROVIDER_SITE_OTHER): Payer: BLUE CROSS/BLUE SHIELD | Admitting: Rehabilitative and Restorative Service Providers"

## 2018-10-05 ENCOUNTER — Telehealth: Payer: Self-pay | Admitting: Rehabilitative and Restorative Service Providers"

## 2018-10-05 ENCOUNTER — Encounter: Payer: Self-pay | Admitting: Rehabilitative and Restorative Service Providers"

## 2018-10-05 VITALS — BP 120/80 | Ht 62.0 in | Wt 124.0 lb

## 2018-10-05 DIAGNOSIS — M25611 Stiffness of right shoulder, not elsewhere classified: Secondary | ICD-10-CM | POA: Diagnosis not present

## 2018-10-05 DIAGNOSIS — R293 Abnormal posture: Secondary | ICD-10-CM

## 2018-10-05 DIAGNOSIS — G8929 Other chronic pain: Secondary | ICD-10-CM | POA: Diagnosis not present

## 2018-10-05 DIAGNOSIS — M25512 Pain in left shoulder: Secondary | ICD-10-CM

## 2018-10-05 DIAGNOSIS — R29898 Other symptoms and signs involving the musculoskeletal system: Secondary | ICD-10-CM

## 2018-10-05 DIAGNOSIS — M7552 Bursitis of left shoulder: Secondary | ICD-10-CM

## 2018-10-05 DIAGNOSIS — M6281 Muscle weakness (generalized): Secondary | ICD-10-CM

## 2018-10-05 DIAGNOSIS — M25511 Pain in right shoulder: Secondary | ICD-10-CM | POA: Diagnosis not present

## 2018-10-05 HISTORY — DX: Bursitis of left shoulder: M75.52

## 2018-10-05 MED ORDER — DICLOFENAC SODIUM 2 % TD SOLN: 2.0000 g | Freq: Two times a day (BID) | TRANSDERMAL | 3 refills | Status: DC

## 2018-10-05 NOTE — Assessment & Plan Note (Signed)
I believe that patient has been in a sling for over a month on the right shoulder using the left shoulder more.  Possibly some overuse injury.  Home exercise given, icing regimen, discussed avoiding a significant amount of lifting if possible.  Patient will hopefully start to be advancing on the right shoulder in the near future.  With that then will be able to start using her left shoulder as frequent and should be making some improvement.

## 2018-10-05 NOTE — Progress Notes (Signed)
Ruth ScaleZach Cambell Crawford D.O. [facility] [address] [place], [place] [address] Phone: [phone] Subjective:   Ruth Crawford, Ruth Crawford, am serving as a scribe for Dr. Antoine PrimasZachary Austyn Crawford.   CC: shoulder pain follow up   NUU:[id number]:Subjective  Ruth PlaterSandy L Crawford is a 63 y.o. female coming in with complaint of right shoulder pain. Has rotator cuff repair. For one month. Is using a sling and is progressing in physical therapy.  Patient feels like she is doing relatively well.  Patient though states that her left shoulder is starting to have more pain.  Patient is having some mild worsening symptoms at this time as well.  Patient feels like it is very similar to what her contralateral side was as well.     Past Medical History:  Diagnosis Date  . Allergy   . ANXIETY   . COMMON MIGRAINE   . DEPRESSION   . DIABETES MELLITUS, TYPE II   . GERD (gastroesophageal reflux disease) 10/17/2012  . HYPERLIPIDEMIA   . HYPERTENSION   . POLYP, GALLBLADDER   . Sickle cell trait [facility](HCC)    Past Surgical History:  Procedure Laterality Date  . ABDOMINAL HYSTERECTOMY  2000  . COLONOSCOPY    . LUMBAR DISC SURGERY  1998, 2016, 2017   s/p   Social History   Socioeconomic History  . Marital status: Married    Spouse name: Not on file  . Number of children: 1  . Years of education: Not on file  . Highest education level: Not on file  Occupational History  . Occupation: Teacher, musicbilling clerk    Comment: at [employer]  Social Needs  . Financial resource strain: Not on file  . Food insecurity:    Worry: Not on file    Inability: Not on file  . Transportation needs:    Medical: Not on file    Non-medical: Not on file  Tobacco Use  . Smoking status: Never Smoker  . Smokeless tobacco: Never Used  Substance and Sexual Activity  . Alcohol use: Yes    Comment: occasional use  . Drug use: No  . Sexual activity: Not on file  Lifestyle  . Physical activity:    Days per week: Not on file    Minutes per session: Not  on file  . Stress: Not on file  Relationships  . Social connections:    Talks on phone: Not on file    Gets together: Not on file    Attends religious service: Not on file    Active member of club or organization: Not on file    Attends meetings of clubs or organizations: Not on file    Relationship status: Not on file  Other Topics Concern  . Not on file  Social History Narrative  . Not on file   Allergies  Allergen Reactions  . Demerol [Meperidine] Other (See Comments)    Per pt: unknown  . Prednisone Itching and Rash   Family History  Problem Relation Age of Onset  . Diabetes Mother   . Heart disease Father   . Stroke Other   . Alcohol abuse Other   . Arthritis Other   . Colon cancer Neg Hx   . Esophageal cancer Neg Hx   . Rectal cancer Neg Hx   . Stomach cancer Neg Hx     Current Outpatient Medications (Endocrine & Metabolic):  Marland Kitchen.  Exenatide ER (BYDUREON) 2 MG PEN, Inject 2 mg into the skin once a week. .Marland Kitchen  glipiZIDE (GLIPIZIDE XL) 5 MG 24 hr tablet, Take 1 tablet (5 mg total) by mouth 2 (two) times daily. .  metFORMIN (GLUCOPHAGE) 1000 MG tablet, TAKE 1 TABLET BY MOUTH TWICE DAILY WITH MEALS  Current Outpatient Medications (Cardiovascular):  .  lisinopril (PRINIVIL,ZESTRIL) 20 MG tablet, Take 1 tablet (20 mg total) by mouth daily. Marland Kitchen  lovastatin (MEVACOR) 40 MG tablet, TAKE 1 TABLET BY MOUTH ONCE DAILY  Current Outpatient Medications (Respiratory):  .  diphenhydrAMINE (BENADRYL) 25 MG tablet, Take 25 mg by mouth daily as needed. .  phenylephrine (SUDAFED PE) 10 MG TABS tablet, Take 10 mg by mouth daily.  Current Outpatient Medications (Analgesics):  Marland Kitchen  Aspirin-Acetaminophen-Caffeine (EXCEDRIN MIGRAINE PO), Take by mouth as needed.   Current Outpatient Medications (Other):  .  azelastine (OPTIVAR) 0.05 % ophthalmic solution, INSTILL ONE DROP INTO EACH EYE TWICE DAILY .  gabapentin (NEURONTIN) 300 MG capsule, Take 1 capsule (300 mg total) by mouth at bedtime.  nightly .  glucose blood test strip, 1 each by Other route as needed for other. RELION PRIME TEST STRIPS  Use as instructed .  RELION LANCETS STANDARD 21G MISC, by Does not apply route. .  terbinafine (LAMISIL) 250 MG tablet, Take 1 tablet (250 mg total) by mouth daily.    Past medical history, social, surgical and family history all reviewed in electronic medical record.  No pertanent information unless stated regarding to the chief complaint.   Review of Systems:  No headache, visual changes, nausea, vomiting, diarrhea, constipation, dizziness, abdominal pain, skin rash, fevers, chills, night sweats, weight loss, swollen lymph nodes, body aches, joint swelling, muscle aches, chest pain, shortness of breath, mood changes.   Objective  There were no vitals taken for this visit. Systems examined below as of    General: No apparent distress alert and oriented x3 mood and affect normal, dressed appropriately.  HEENT: Pupils equal, extraocular movements intact  Respiratory: Patient's speak in full sentences and does not appear short of breath  Cardiovascular: No lower extremity edema, non tender, no erythema  Skin: Warm dry intact with no signs of infection or rash on extremities or on axial skeleton.  Abdomen: Soft nontender  Neuro: Cranial nerves II through XII are intact, neurovascularly intact in all extremities with 2+ DTRs and 2+ pulses.  Lymph: No lymphadenopathy of posterior or anterior cervical chain or axillae bilaterally.  Gait normal with good balance and coordination.  MSK:  Non tender with full range of motion and good stability and symmetric strength and tone of  elbows, wrist, hip, knee and ankles bilaterally.  Shoulder: Left Inspection reveals no abnormalities, atrophy or asymmetry. Palpation is normal with no tenderness over AC joint or bicipital groove. ROM is full in all planes. Rotator cuff strength normal throughout. Positive impingement Speeds and Yergason's tests  normal. No labral pathology noted with negative Obrien's, negative clunk and good stability. Normal scapular function observed. No painful arc and no drop arm sign. No apprehension sign Contralateral shoulder shows that he is in a sling and we did not do a full assessment.  Patient is neurovascular intact distally though.       Impression and Recommendations:     This case required medical decision making of moderate complexity. The above documentation has been reviewed and is accurate and complete Judi Saa, DO       Note: This dictation was prepared with Dragon dictation along with smaller phrase technology. Any transcriptional errors that result from this process are unintentional.

## 2018-10-05 NOTE — Patient Instructions (Addendum)
Flexors Stretch, Standing    Stand about a thigh's length from table. Grip edges of table. Bend knees until stretch is felt under shoulder blades in chest. Hold _10__ seconds. Repeat _5__ times per session. Do _3__ sessions per day.    Flexors Stick Stretch, Supine using left arm to move right arm    Lie on back, stick in both hands above chest. Extend both arms over head shoulder to 90 degrees. Hold _5-10__ seconds. Repeat __5-10_ times per session. Do _2-3__ sessions per day.

## 2018-10-05 NOTE — Telephone Encounter (Addendum)
Will decrease frequency to 1x/wk pending MD visit next week - progression protocol   Celyn P. Leonor Liv PT, MPH 10/05/18 9:52 AM

## 2018-10-05 NOTE — Therapy (Signed)
[facility] [address] [place], [place], [address] Phone: [phone]   Fax:  [phone]  Physical Therapy Treatment  Patient Details  Name: Ruth Crawford MRN: [id number] Date of Birth: 08/01/1956 Referring Provider (PT): Dr Jones BroomJustin Chandler    Encounter Date: 10/05/2018  PT End of Session - 10/05/18 0808    Visit Number  5    Number of Visits  12    Date for PT Re-Evaluation  12/14/18    PT Start Time  0807    PT Stop Time  0903    PT Time Calculation (min)  56 min    Activity Tolerance  Patient tolerated treatment well       Past Medical History:  Diagnosis Date  . Allergy   . ANXIETY   . COMMON MIGRAINE   . DEPRESSION   . DIABETES MELLITUS, TYPE II   . GERD (gastroesophageal reflux disease) 10/17/2012  . HYPERLIPIDEMIA   . HYPERTENSION   . POLYP, GALLBLADDER   . Sickle cell trait [facility](HCC)     Past Surgical History:  Procedure Laterality Date  . ABDOMINAL HYSTERECTOMY  2000  . COLONOSCOPY    . LUMBAR DISC SURGERY  1998, 2016, 2017   s/p    There were no vitals filed for this visit.  Subjective Assessment - 10/05/18 0808    Subjective  Reached with Rt arm to wash under Lt arm last week and had increased pain for a couple of days. Rested shoulder yesterday and it feels better today.     Currently in Pain?  No/denies         [facility] - 10/05/18 0001      Assessment   Medical Diagnosis  Rt RCR    Referring Provider (PT)  Dr Jones BroomJustin Chandler     Onset Date/Surgical Date  09/01/18    Hand Dominance  Right    Next MD Visit  09/2018    Prior Therapy  yes here - 5/19      PROM   Right/Left Shoulder  --   PROM limited per protocol    Right Shoulder Flexion  93 Degrees    Right Shoulder ABduction  85 Degrees    Right Shoulder External Rotation  20 Degrees                   [facility] Adult PT Treatment/Exercise - 10/05/18 0001      Shoulder Exercises: Seated   Other Seated  Exercises  shoulder rolls x 15 forward/ 15 backward      Shoulder Exercises: Standing   Other Standing Exercises  axial extension 10 sec x 5; scap retraction with noodle 10 sec x 10       Shoulder Exercises: ROM/Strengthening   Pendulum  20 CW/20 CCW-continues to hold UE stiffly with pendulums      Shoulder Exercises: Stretch   External Rotation Stretch  10 seconds   10 reps standing with cane elbow at 90 deg flexion    Table Stretch - Flexion  10 seconds   10 reps    Other Shoulder Stretches  stepping back at counter to stretch into shoulder flexion passively 10 sec x 5     Other Shoulder Stretches  assisted shoulder flexion supine to 90 deg using Lt UE to move Rt 10 sec hold x 5 reps       Wrist Exercises   Wrist Flexion  AROM;Right;15 reps    Wrist Extension  AROM;Right;15  reps    Other wrist exercises  forearm supination/pronation x 15 reps       Moist Heat Therapy   Number Minutes Moist Heat  15 Minutes    Moist Heat Location  Shoulder   Lt - patient cold with vaso      Vasopneumatic   Number Minutes Vasopneumatic   15 minutes    Vasopnuematic Location   Shoulder   Rt    Vasopneumatic Pressure  Low    Vasopneumatic Temperature   34 deg       Manual Therapy   Manual therapy comments  pt supine     Soft tissue mobilization  soft tissue work through the [employer]; trap; teres     Scapular Mobilization  gentle Rt scapular motion     Passive ROM  Lt shoulder flexion; scaption; ER within allowed ROM per protocol              PT Education - 10/05/18 0821    Education Details  HEP     Person(s) Educated  Patient    Methods  Explanation;Demonstration;Tactile cues;Verbal cues;Handout    Comprehension  Verbalized understanding;Returned demonstration;Verbal cues required;Tactile cues required       PT Short Term Goals - 09/21/18 1347      PT SHORT TERM GOAL #1   Title  Pt will be independent in inital HEP per protocol 11/02/2018    Time  6    Period  Weeks    Status   New      PT SHORT TERM GOAL #2   Title  Improve posture and alignment with patient to demonstrate improve upright posture and good position of scapulae along thoracic wall 11/02/2018    Time  6    Period  Weeks    Status  New      PT SHORT TERM GOAL #3   Title  PROM Rt shoulder gradually restored to [facility] 11/02/2018    Time  6    Period  Weeks    Status  New        PT Long Term Goals - 09/21/18 1350      PT LONG TERM GOAL #1   Title  Increase AROM Rt shoulder to within 5-10 deg of AROM Lt shoulder 12/14/2018    Time  12    Period  Weeks    Status  New      PT LONG TERM GOAL #2   Title  Return to light functional tasks using Rt UE 12/14/2018    Time  12    Period  Weeks    Status  New      PT LONG TERM GOAL #3   Title  Pt will report minimal to no pain with ADL's (with no lifting per protocol) 12/14/2018    Time  12    Period  Weeks    Status  New      PT LONG TERM GOAL #4   Title  Indpendent in HEP 12/14/2018    Time  12    Period  Weeks    Status  New      PT LONG TERM GOAL #5   Title  Improve FOTO to </= 48% limitation 12/14/2018    Time  12    Period  Weeks    Status  New            Plan - 10/05/18 0808    Clinical Impression Statement  Progressing well with shoudler rehab  within rehab protocol. PROM to 90 deg flex and 20 deg ER. Will decrease PT to 1x/wk this week. Patient returns to MD next week. Will progress with rehabe as ordered/per protocol.     Rehab Potential  Good    PT Frequency  2x / week    PT Duration  12 weeks    PT Treatment/Interventions  Patient/family education;ADLs/Self Care Home Management;Cryotherapy;Electrical Stimulation;Iontophoresis 4mg /ml Dexamethasone;Moist Heat;Ultrasound;Passive range of motion;Neuromuscular re-education;Manual techniques;Therapeutic activities;Therapeutic exercise    PT Next Visit Plan  progress with shoulder rehab per protocol    Consulted and Agree with Plan of Care  Patient       Patient will benefit  from skilled therapeutic intervention in order to improve the following deficits and impairments:  Postural dysfunction, Improper body mechanics, Pain, Increased fascial restricitons, Increased muscle spasms, Decreased mobility, Decreased range of motion, Decreased strength, Decreased activity tolerance  Visit Diagnosis: Chronic right shoulder pain  Muscle weakness (generalized)  Stiffness of right shoulder, not elsewhere classified  Other symptoms and signs involving the musculoskeletal system  Abnormal posture  Acute pain of right shoulder     Problem List Patient Active Problem List   Diagnosis Date Noted  . Lumbar disc disease 08/26/2018  . Preop exam for internal medicine 08/26/2018  . Dizziness 07/01/2018  . Nail, ingrown 04/13/2018  . Onychomycosis 04/13/2018  . 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/02/2018  . Right rotator cuff tear 01/05/2018  . Right cervical radiculopathy 12/12/2017  . Acute shoulder bursitis, right 12/12/2017  . Seasonal allergic rhinitis due to pollen 04/17/2017  . 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 Left-sided face pain 03/26/2017  . TMJ tenderness, left 02/05/2017  . Anemia 08/30/2016  . Parotitis, acute 03/26/2016  . Coccygeal pain, acute 02/28/2016  . Rash 10/18/2015  . Urine frequency 05/30/2015  . Lower back pain 05/30/2015  . Allergic conjunctivitis 03/02/2015  . Bursitis of right shoulder 05/09/2014  . Acute bronchitis 03/23/2014  . Seasonal allergic conjunctivitis 01/01/2014  . Food allergy 01/01/2014  . Conjunctivitis 09/12/2013  . Seasonal and perennial allergic rhinitis 09/12/2013  . Abdominal pain, other specified site 03/18/2013  . GERD (gastroesophageal reflux disease) 10/17/2012  . Encounter for well adult exam with abnormal findings 04/07/2012  . POLYP, GALLBLADDER 11/30/2010  . SHOULDER PAIN, RIGHT 11/30/2010  . CERVICAL RADICULOPATHY, RIGHT 11/30/2010  . DEPRESSION 03/26/2010  . Headache(784.0) 03/26/2010  .  SYNCOPE 06/07/2009  . Diabetes (HCC) 10/29/2007  . Hyperlipidemia 10/29/2007  . Anxiety state 10/29/2007  . COMMON MIGRAINE 10/29/2007  . Essential hypertension 10/29/2007  . BACK PAIN 10/29/2007    Travonna Swindle Rober MinionP Maida Widger PT, MPH  10/05/2018, 8:47 AM  [facility] [address] [place], [place], [address] Phone: [phone]   Fax:  [phone]  Name: Ruth Crawford MRN: [id number] Date of Birth: 10/24/1955

## 2018-10-05 NOTE — Patient Instructions (Signed)
Good to see you  You are doing great overall  Xray downstairs  pennsaid pinkie amount topically 2 times daily as needed.  Xray of shoulder downstairs just ot have it  See me again in 4-6 weeks

## 2018-10-08 ENCOUNTER — Encounter: Payer: BLUE CROSS/BLUE SHIELD | Admitting: Physical Therapy

## 2018-10-12 ENCOUNTER — Encounter: Payer: Self-pay | Admitting: Internal Medicine

## 2018-10-12 ENCOUNTER — Ambulatory Visit (INDEPENDENT_AMBULATORY_CARE_PROVIDER_SITE_OTHER): Payer: BLUE CROSS/BLUE SHIELD | Admitting: Rehabilitative and Restorative Service Providers"

## 2018-10-12 DIAGNOSIS — M6281 Muscle weakness (generalized): Secondary | ICD-10-CM

## 2018-10-12 DIAGNOSIS — R293 Abnormal posture: Secondary | ICD-10-CM

## 2018-10-12 DIAGNOSIS — G8929 Other chronic pain: Secondary | ICD-10-CM

## 2018-10-12 DIAGNOSIS — M25511 Pain in right shoulder: Secondary | ICD-10-CM

## 2018-10-12 DIAGNOSIS — M25611 Stiffness of right shoulder, not elsewhere classified: Secondary | ICD-10-CM

## 2018-10-12 DIAGNOSIS — R29898 Other symptoms and signs involving the musculoskeletal system: Secondary | ICD-10-CM

## 2018-10-12 DIAGNOSIS — Z9889 Other specified postprocedural states: Secondary | ICD-10-CM | POA: Diagnosis not present

## 2018-10-12 NOTE — Patient Instructions (Addendum)
Thoracic Lift    Press shoulders down. Then lift mid-thoracic spine (area between the shoulder blades). Lift the breastbone slightly. Hold __10_ seconds. Relax. Repeat _10__ times. 1-2 sets 2-3 times a day    Progressive Resisted: External Rotation (Side-Lying)    Squeeze shoulder blades down and back resting right arm on side - bring hand up level with body NOT lifting higher for now. Keep elbow bent and at side. Repeat __10__ times per set. Do __1-2__ sets per session. Do _1-2___ sessions per day.   Progressive Resisted: Extension (Prone)    Pull shoulder blades down and back. Raise right arm up even with hips  Back. NO WEIGHTS - keep arm level with body for now  Repeat __10__ times per set. Do __1-2__ sets per session. Do __1-2__ sessions per day.  h

## 2018-10-12 NOTE — Therapy (Signed)
[facility] [address] [place], [place], [address] Phone: [phone]   Fax:  [phone]  Physical Therapy Treatment  Patient Details  Name: Ruth Crawford MRN: [id number] Date of Birth: 05/05/1956 Referring Provider (PT): Dr Jones BroomJustin Chandler    Encounter Date: 10/12/2018  PT End of Session - 10/12/18 1248    Visit Number  6    Number of Visits  12    Date for PT Re-Evaluation  12/14/18    PT Start Time  1105    PT Stop Time  1202   moist heat last 15 min of treatment    PT Time Calculation (min)  57 min    Activity Tolerance  Patient tolerated treatment well       Past Medical History:  Diagnosis Date  . Allergy   . ANXIETY   . COMMON MIGRAINE   . DEPRESSION   . DIABETES MELLITUS, TYPE II   . GERD (gastroesophageal reflux disease) 10/17/2012  . HYPERLIPIDEMIA   . HYPERTENSION   . POLYP, GALLBLADDER   . Sickle cell trait [facility](HCC)     Past Surgical History:  Procedure Laterality Date  . ABDOMINAL HYSTERECTOMY  2000  . COLONOSCOPY    . LUMBAR DISC SURGERY  1998, 2016, 2017   s/p    There were no vitals filed for this visit.  Subjective Assessment - 10/12/18 1247    Subjective  MD pleased with Ruth Crawford's progress when he saw her this am. OK to progress with the protocol.     Currently in Pain?  No/denies         [facility] - 10/12/18 0001      Assessment   Medical Diagnosis  Rt RCR    Referring Provider (PT)  Dr Jones BroomJustin Chandler     Onset Date/Surgical Date  09/01/18    Hand Dominance  Right    Next MD Visit  09/2018    Prior Therapy  yes here - 5/19      PROM   Right Shoulder Flexion  127 Degrees    Right Shoulder ABduction  125 Degrees    Right Shoulder External Rotation  45 Degrees                   [facility] Adult PT Treatment/Exercise - 10/12/18 0001      Shoulder Exercises: Supine   Other Supine Exercises  chest lift 10 sec x 10       Shoulder Exercises: Seated   Other  Seated Exercises  shoulder rolls x 15 forward/ 15 backward      Shoulder Exercises: Prone   Retraction  AROM;Both;10 reps   shoulder blades down and back    Extension  AROM;Right;10 reps   engaging posterior shoulder girdle 1st; ext to neutral      Shoulder Exercises: Sidelying   External Rotation  AROM;Right;10 reps      Shoulder Exercises: Standing   Other Standing Exercises  axial extension 10 sec x 5; scap retraction with noodle 10 sec x 10       Shoulder Exercises: ROM/Strengthening   Pendulum  20 CW/20 CCW-continues to hold UE stiffly with pendulums      Shoulder Exercises: Stretch   External Rotation Stretch  10 seconds   10 reps standing with cane elbow at 90 deg flexion    Other Shoulder Stretches  stepping back at counter to stretch into shoulder flexion passively 10 sec x 5  Other Shoulder Stretches  assisted shoulder flexion supine to pt tolerance using Lt UE to move Rt 10 sec hold x 5 reps              PT Education - 10/12/18 1149    Education Details  HEP     Person(s) Educated  Patient    Methods  Explanation;Demonstration;Tactile cues;Verbal cues;Handout    Comprehension  Verbalized understanding;Returned demonstration;Verbal cues required;Tactile cues required       PT Short Term Goals - 09/21/18 1347      PT SHORT TERM GOAL #1   Title  Pt will be independent in inital HEP per protocol 11/02/2018    Time  6    Period  Weeks    Status  New      PT SHORT TERM GOAL #2   Title  Improve posture and alignment with patient to demonstrate improve upright posture and good position of scapulae along thoracic wall 11/02/2018    Time  6    Period  Weeks    Status  New      PT SHORT TERM GOAL #3   Title  PROM Rt shoulder gradually restored to [facility] 11/02/2018    Time  6    Period  Weeks    Status  New        PT Long Term Goals - 09/21/18 1350      PT LONG TERM GOAL #1   Title  Increase AROM Rt shoulder to within 5-10 deg of AROM Lt shoulder  12/14/2018    Time  12    Period  Weeks    Status  New      PT LONG TERM GOAL #2   Title  Return to light functional tasks using Rt UE 12/14/2018    Time  12    Period  Weeks    Status  New      PT LONG TERM GOAL #3   Title  Pt will report minimal to no pain with ADL's (with no lifting per protocol) 12/14/2018    Time  12    Period  Weeks    Status  New      PT LONG TERM GOAL #4   Title  Indpendent in HEP 12/14/2018    Time  12    Period  Weeks    Status  New      PT LONG TERM GOAL #5   Title  Improve FOTO to </= 48% limitation 12/14/2018    Time  12    Period  Weeks    Status  New            Plan - 10/12/18 1249    Clinical Impression Statement  Patient was seen by MD this am who was pleased with her progress. She is OK to progress with exercise per protocol. Patient added exercises without difficulty. PT worked on PROM with patient demonstrating increased ROM in all planes. Progressing well with rehab. Will continue PT 1x/wk this week and consider increasing to 2x/wk next week.     Rehab Potential  Good    PT Frequency  2x / week    PT Duration  12 weeks    PT Treatment/Interventions  Patient/family education;ADLs/Self Care Home Management;Cryotherapy;Electrical Stimulation;Iontophoresis 4mg /ml Dexamethasone;Moist Heat;Ultrasound;Passive range of motion;Neuromuscular re-education;Manual techniques;Therapeutic activities;Therapeutic exercise    PT Next Visit Plan  progress with shoulder rehab per protocol    Consulted and Agree with Plan of Care  Patient  Patient will benefit from skilled therapeutic intervention in order to improve the following deficits and impairments:  Postural dysfunction, Improper body mechanics, Pain, Increased fascial restricitons, Increased muscle spasms, Decreased mobility, Decreased range of motion, Decreased strength, Decreased activity tolerance  Visit Diagnosis: Chronic right shoulder pain  Muscle weakness (generalized)  Stiffness  of right shoulder, not elsewhere classified  Other symptoms and signs involving the musculoskeletal system  Abnormal posture  Acute pain of right shoulder     Problem List Patient Active Problem List   Diagnosis Date Noted  . Acute bursitis of left shoulder 10/05/2018  . Lumbar disc disease 08/26/2018  . Preop exam for internal medicine 08/26/2018  . Dizziness 07/01/2018  . Nail, ingrown 04/13/2018  . Onychomycosis 04/13/2018  . 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/02/2018  . Right rotator cuff tear 01/05/2018  . Right cervical radiculopathy 12/12/2017  . Acute shoulder bursitis, right 12/12/2017  . Seasonal allergic rhinitis due to pollen 04/17/2017  . 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 Left-sided face pain 03/26/2017  . TMJ tenderness, left 02/05/2017  . Anemia 08/30/2016  . Parotitis, acute 03/26/2016  . Coccygeal pain, acute 02/28/2016  . Rash 10/18/2015  . Urine frequency 05/30/2015  . Lower back pain 05/30/2015  . Allergic conjunctivitis 03/02/2015  . Bursitis of right shoulder 05/09/2014  . Acute bronchitis 03/23/2014  . Seasonal allergic conjunctivitis 01/01/2014  . Food allergy 01/01/2014  . Conjunctivitis 09/12/2013  . Seasonal and perennial allergic rhinitis 09/12/2013  . Abdominal pain, other specified site 03/18/2013  . GERD (gastroesophageal reflux disease) 10/17/2012  . Encounter for well adult exam with abnormal findings 04/07/2012  . POLYP, GALLBLADDER 11/30/2010  . SHOULDER PAIN, RIGHT 11/30/2010  . CERVICAL RADICULOPATHY, RIGHT 11/30/2010  . DEPRESSION 03/26/2010  . Headache(784.0) 03/26/2010  . SYNCOPE 06/07/2009  . Diabetes (HCC) 10/29/2007  . Hyperlipidemia 10/29/2007  . Anxiety state 10/29/2007  . COMMON MIGRAINE 10/29/2007  . Essential hypertension 10/29/2007  . BACK PAIN 10/29/2007    Darel Ricketts Rober Minion PT, MPH  10/12/2018, 12:52 PM  [facility] [address] [place] [address]  [address] [place], [place], [address] Phone: [phone]   Fax:  [phone]  Name: Ruth Crawford MRN: [id number] Date of Birth: August 26, 1956

## 2018-10-15 ENCOUNTER — Encounter: Payer: BLUE CROSS/BLUE SHIELD | Admitting: Rehabilitative and Restorative Service Providers"

## 2018-10-19 ENCOUNTER — Encounter: Payer: Self-pay | Admitting: Physical Therapy

## 2018-10-19 ENCOUNTER — Ambulatory Visit (INDEPENDENT_AMBULATORY_CARE_PROVIDER_SITE_OTHER): Payer: BLUE CROSS/BLUE SHIELD | Admitting: Physical Therapy

## 2018-10-19 ENCOUNTER — Other Ambulatory Visit: Payer: Self-pay | Admitting: Internal Medicine

## 2018-10-19 DIAGNOSIS — R29898 Other symptoms and signs involving the musculoskeletal system: Secondary | ICD-10-CM

## 2018-10-19 DIAGNOSIS — M25611 Stiffness of right shoulder, not elsewhere classified: Secondary | ICD-10-CM

## 2018-10-19 DIAGNOSIS — G8929 Other chronic pain: Secondary | ICD-10-CM

## 2018-10-19 DIAGNOSIS — M6281 Muscle weakness (generalized): Secondary | ICD-10-CM | POA: Diagnosis not present

## 2018-10-19 DIAGNOSIS — M25511 Pain in right shoulder: Secondary | ICD-10-CM | POA: Diagnosis not present

## 2018-10-19 DIAGNOSIS — R293 Abnormal posture: Secondary | ICD-10-CM

## 2018-10-19 NOTE — Therapy (Signed)
[facility] [address] [place] [address] [place], [place], [address] Phone: [phone]   Fax:  [phone]  Physical Therapy Treatment  Patient Details  Name: Ruth Crawford MRN: [id number] Date of Birth: 05/17/1956 Referring Provider (PT): Dr Jones BroomJustin Chandler    Encounter Date: 10/19/2018  PT End of Session - 10/19/18 0924    Visit Number  7    Number of Visits  12    Date for PT Re-Evaluation  12/14/18    PT Start Time  0842    PT Stop Time  0936    PT Time Calculation (min)  54 min    Activity Tolerance  Patient tolerated treatment well    Behavior During Therapy  [facility] for tasks assessed/performed       Past Medical History:  Diagnosis Date  . Allergy   . ANXIETY   . COMMON MIGRAINE   . DEPRESSION   . DIABETES MELLITUS, TYPE II   . GERD (gastroesophageal reflux disease) 10/17/2012  . HYPERLIPIDEMIA   . HYPERTENSION   . POLYP, GALLBLADDER   . Sickle cell trait [facility](HCC)     Past Surgical History:  Procedure Laterality Date  . ABDOMINAL HYSTERECTOMY  2000  . COLONOSCOPY    . LUMBAR DISC SURGERY  1998, 2016, 2017   s/p    There were no vitals filed for this visit.  Subjective Assessment - 10/19/18 0844    Subjective  had incident on Th 1/30 where she reached quickly with Rt arm and felt some pain and pulling of muscle.  Pt sling back on and rested shoulder.      Patient Stated Goals  use arm normally     Currently in Pain?  Yes    Pain Score  4     Pain Location  Shoulder    Pain Orientation  Right    Pain Descriptors / Indicators  Aching    Pain Type  Surgical pain;Chronic pain    Pain Onset  1 to 4 weeks ago    Pain Frequency  Intermittent    Aggravating Factors   movement in the wrong way    Pain Relieving Factors  meds, ice                       [facility] Adult PT Treatment/Exercise - 10/19/18 0848      Shoulder Exercises: Supine   Flexion  AAROM;15 reps   cane   Other Supine Exercises  chest lift 5  sec x 15       Shoulder Exercises: Seated   Retraction  Both;15 reps   5 sec hold   Other Seated Exercises  shoulder rolls x 15 forward/ 15 backward      Shoulder Exercises: Prone   Retraction  Right;15 reps    Retraction Limitations  to neutral    Extension  AROM;Right;10 reps   engaging posterior shoulder girdle 1st; ext to neutral      Shoulder Exercises: Pulleys   Flexion  3 minutes    Scaption  3 minutes      Shoulder Exercises: Isometric Strengthening   Flexion Limitations  supine; 10x5"    Extension Limitations  supine; 10x5"    External Rotation Limitations  supine; 10x5"    Internal Rotation Limitations  supine; 10x5"      Moist Heat Therapy   Number Minutes Moist Heat  10 Minutes    Moist Heat Location  Shoulder   during isometrics; prior  to PROM     Vasopneumatic   Number Minutes Vasopneumatic   15 minutes    Vasopnuematic Location   Shoulder    Vasopneumatic Pressure  Low    Vasopneumatic Temperature   34 deg       Manual Therapy   Soft tissue mobilization  soft tissue work through the [employer]; trap; teres     Passive ROM  Lt shoulder flexion; scaption; ER to tolerance               PT Short Term Goals - 09/21/18 1347      PT SHORT TERM GOAL #1   Title  Pt will be independent in inital HEP per protocol 11/02/2018    Time  6    Period  Weeks    Status  New      PT SHORT TERM GOAL #2   Title  Improve posture and alignment with patient to demonstrate improve upright posture and good position of scapulae along thoracic wall 11/02/2018    Time  6    Period  Weeks    Status  New      PT SHORT TERM GOAL #3   Title  PROM Rt shoulder gradually restored to [facility] 11/02/2018    Time  6    Period  Weeks    Status  New        PT Long Term Goals - 09/21/18 1350      PT LONG TERM GOAL #1   Title  Increase AROM Rt shoulder to within 5-10 deg of AROM Lt shoulder 12/14/2018    Time  12    Period  Weeks    Status  New      PT LONG TERM GOAL #2    Title  Return to light functional tasks using Rt UE 12/14/2018    Time  12    Period  Weeks    Status  New      PT LONG TERM GOAL #3   Title  Pt will report minimal to no pain with ADL's (with no lifting per protocol) 12/14/2018    Time  12    Period  Weeks    Status  New      PT LONG TERM GOAL #4   Title  Indpendent in HEP 12/14/2018    Time  12    Period  Weeks    Status  New      PT LONG TERM GOAL #5   Title  Improve FOTO to </= 48% limitation 12/14/2018    Time  12    Period  Weeks    Status  New            Plan - 10/19/18 0923    Clinical Impression Statement  Pt overall progressing well with PT and able to tolerate isometrics today.  Pt with recent quick reaching episode aggravating Rt shoulder, limiting PROM today.  Advised pt to monitor, and if pain/symptoms persist she may need to follow up with MD.  Will continue to benefit from PT to maximize function.    Rehab Potential  Good    PT Frequency  2x / week    PT Duration  12 weeks    PT Treatment/Interventions  Patient/family education;ADLs/Self Care Home Management;Cryotherapy;Electrical Stimulation;Iontophoresis 4mg /ml Dexamethasone;Moist Heat;Ultrasound;Passive range of motion;Neuromuscular re-education;Manual techniques;Therapeutic activities;Therapeutic exercise    PT Next Visit Plan  progress with shoulder rehab per protocol    Consulted and Agree with Plan of Care  Patient       Patient will benefit from skilled therapeutic intervention in order to improve the following deficits and impairments:  Postural dysfunction, Improper body mechanics, Pain, Increased fascial restricitons, Increased muscle spasms, Decreased mobility, Decreased range of motion, Decreased strength, Decreased activity tolerance  Visit Diagnosis: Chronic right shoulder pain  Muscle weakness (generalized)  Stiffness of right shoulder, not elsewhere classified  Other symptoms and signs involving the musculoskeletal system  Abnormal  posture  Acute pain of right shoulder     Problem List Patient Active Problem List   Diagnosis Date Noted  . Acute bursitis of left shoulder 10/05/2018  . Lumbar disc disease 08/26/2018  . Preop exam for internal medicine 08/26/2018  . Dizziness 07/01/2018  . Nail, ingrown 04/13/2018  . Onychomycosis 04/13/2018  . 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/02/2018  . Right rotator cuff tear 01/05/2018  . Right cervical radiculopathy 12/12/2017  . Acute shoulder bursitis, right 12/12/2017  . Seasonal allergic rhinitis due to pollen 04/17/2017  . 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 Left-sided face pain 03/26/2017  . TMJ tenderness, left 02/05/2017  . Anemia 08/30/2016  . Parotitis, acute 03/26/2016  . Coccygeal pain, acute 02/28/2016  . Rash 10/18/2015  . Urine frequency 05/30/2015  . Lower back pain 05/30/2015  . Allergic conjunctivitis 03/02/2015  . Bursitis of right shoulder 05/09/2014  . Acute bronchitis 03/23/2014  . Seasonal allergic conjunctivitis 01/01/2014  . Food allergy 01/01/2014  . Conjunctivitis 09/12/2013  . Seasonal and perennial allergic rhinitis 09/12/2013  . Abdominal pain, other specified site 03/18/2013  . GERD (gastroesophageal reflux disease) 10/17/2012  . Encounter for well adult exam with abnormal findings 04/07/2012  . POLYP, GALLBLADDER 11/30/2010  . SHOULDER PAIN, RIGHT 11/30/2010  . CERVICAL RADICULOPATHY, RIGHT 11/30/2010  . DEPRESSION 03/26/2010  . Headache(784.0) 03/26/2010  . SYNCOPE 06/07/2009  . Diabetes (HCC) 10/29/2007  . Hyperlipidemia 10/29/2007  . Anxiety state 10/29/2007  . COMMON MIGRAINE 10/29/2007  . Essential hypertension 10/29/2007  . BACK PAIN 10/29/2007      Ruth Crawford, PT, DPT 10/19/18 9:26 AM    [facility] [address] [place] [address] [place], [place], [address] Phone: [phone]   Fax:  [phone]  Name: GAO BLAUER MRN: [id number] Date of  Birth: 1956/05/13

## 2018-10-22 ENCOUNTER — Encounter: Payer: BLUE CROSS/BLUE SHIELD | Admitting: Physical Therapy

## 2018-10-22 ENCOUNTER — Ambulatory Visit (INDEPENDENT_AMBULATORY_CARE_PROVIDER_SITE_OTHER): Payer: BLUE CROSS/BLUE SHIELD | Admitting: Rehabilitative and Restorative Service Providers"

## 2018-10-22 ENCOUNTER — Encounter: Payer: Self-pay | Admitting: Rehabilitative and Restorative Service Providers"

## 2018-10-22 DIAGNOSIS — M25511 Pain in right shoulder: Secondary | ICD-10-CM

## 2018-10-22 DIAGNOSIS — M25611 Stiffness of right shoulder, not elsewhere classified: Secondary | ICD-10-CM | POA: Diagnosis not present

## 2018-10-22 DIAGNOSIS — R29898 Other symptoms and signs involving the musculoskeletal system: Secondary | ICD-10-CM | POA: Diagnosis not present

## 2018-10-22 DIAGNOSIS — G8929 Other chronic pain: Secondary | ICD-10-CM

## 2018-10-22 DIAGNOSIS — M6281 Muscle weakness (generalized): Secondary | ICD-10-CM

## 2018-10-22 DIAGNOSIS — R293 Abnormal posture: Secondary | ICD-10-CM

## 2018-10-22 NOTE — Therapy (Signed)
[facility] [place] [address] [place], [place], [address] Phone: [phone]   Fax:  [phone]  Physical Therapy Treatment  Patient Details  Name: Ruth Crawford MRN: [id number] Date of Birth: May 02, 1956 Referring Provider (PT): Dr Jones Broom    Encounter Date: 10/22/2018  PT End of Session - 10/22/18 1400    Visit Number  8    Number of Visits  12    Date for PT Re-Evaluation  12/14/18    PT Start Time  1400    PT Stop Time  1456    PT Time Calculation (min)  56 min    Activity Tolerance  Patient tolerated treatment well       Past Medical History:  Diagnosis Date  . Allergy   . ANXIETY   . COMMON MIGRAINE   . DEPRESSION   . DIABETES MELLITUS, TYPE II   . GERD (gastroesophageal reflux disease) 10/17/2012  . HYPERLIPIDEMIA   . HYPERTENSION   . POLYP, GALLBLADDER   . Sickle cell trait [facility])     Past Surgical History:  Procedure Laterality Date  . ABDOMINAL HYSTERECTOMY  2000  . COLONOSCOPY    . LUMBAR DISC SURGERY  1998, 2016, 2017   s/p    There were no vitals filed for this visit.  Subjective Assessment - 10/22/18 1401    Subjective  Shoulder is better than when she reached quickly last Thursday. She still has some pain in the biceps/arm area.     Currently in Pain?  No/denies         [facility] PT Assessment - 10/22/18 0001      Assessment   Medical Diagnosis  Rt RCR    Referring Provider (PT)  Dr Jones Broom     Onset Date/Surgical Date  09/01/18    Hand Dominance  Right    Next MD Visit  09/2018    Prior Therapy  yes here - 5/19      PROM   Right Shoulder Extension  75 Degrees    Right Shoulder Flexion  142 Degrees    Right Shoulder ABduction  147 Degrees   in scapular plane    Right Shoulder External Rotation  78 Degrees   in scapular plane                   [facility] Adult PT Treatment/Exercise - 10/22/18 0001      Shoulder Exercises: Supine   Flexion  AAROM;10 reps   cane    Other Supine Exercises  chest lift 5 sec x 10 - added abdominal core to avoid LBP        Shoulder Exercises: Standing   External Rotation  AROM;Both;10 reps   with noodle    ABduction  AROM;Both;10 reps   scaption    Retraction  Strengthening;Both;10 reps   10 sec hold    Other Standing Exercises  axial extension 10 sec x 5; scap retraction with noodle 10 sec x 10     Other Standing Exercises  shoulder rolls fwd/back x 10 each       Shoulder Exercises: Pulleys   Flexion  --   10 sec hold x 10 reps    Scaption  --   10 sec hold x 10 reps      Shoulder Exercises: Isometric Strengthening   Extension  5X5"   standing    External Rotation  5X5"   standing    Internal Rotation  5X5"  standing   ABduction  5X5"   standing      Shoulder Exercises: Stretch   External Rotation Stretch  5 reps;10 seconds   standing with cane    Other Shoulder Stretches  stepping back at counter to stretch into shoulder flexion passively 10 sec x 5     Other Shoulder Stretches  hands clasp behind head to stretch into ER 30 sec x 2 reps PT assist to change positions       Vasopneumatic   Number Minutes Vasopneumatic   15 minutes    Vasopnuematic Location   Shoulder    Vasopneumatic Pressure  Low    Vasopneumatic Temperature   34 deg       Manual Therapy   Soft tissue mobilization  soft tissue work through the [employer]; trap; teres; biceps; anterior shoulder      Scapular Mobilization  gentle Rt scapular motion     Passive ROM  Lt shoulder flexion; scaption; ER; extension - with elbow flexed and extended              PT Education - 10/22/18 1446    Education Details  HEP     Person(s) Educated  Patient    Methods  Explanation;Demonstration;Tactile cues;Verbal cues;Handout    Comprehension  Verbalized understanding;Returned demonstration;Verbal cues required;Tactile cues required       PT Short Term Goals - 09/21/18 1347      PT SHORT TERM GOAL #1   Title  Pt will be independent in  inital HEP per protocol 11/02/2018    Time  6    Period  Weeks    Status  New      PT SHORT TERM GOAL #2   Title  Improve posture and alignment with patient to demonstrate improve upright posture and good position of scapulae along thoracic wall 11/02/2018    Time  6    Period  Weeks    Status  New      PT SHORT TERM GOAL #3   Title  PROM Rt shoulder gradually restored to [facility] 11/02/2018    Time  6    Period  Weeks    Status  New        PT Long Term Goals - 09/21/18 1350      PT LONG TERM GOAL #1   Title  Increase AROM Rt shoulder to within 5-10 deg of AROM Lt shoulder 12/14/2018    Time  12    Period  Weeks    Status  New      PT LONG TERM GOAL #2   Title  Return to light functional tasks using Rt UE 12/14/2018    Time  12    Period  Weeks    Status  New      PT LONG TERM GOAL #3   Title  Pt will report minimal to no pain with ADL's (with no lifting per protocol) 12/14/2018    Time  12    Period  Weeks    Status  New      PT LONG TERM GOAL #4   Title  Indpendent in HEP 12/14/2018    Time  12    Period  Weeks    Status  New      PT LONG TERM GOAL #5   Title  Improve FOTO to </= 48% limitation 12/14/2018    Time  12    Period  Weeks    Status  New  Plan - 10/22/18 1401    Clinical Impression Statement  Shoulder pain decreased from jerking motion last week. Further decrease in tightness with treatment including STM and stretching. Added stabilization exercises and continue work on PROM/stretching - working toward full PROM per protocol. Good gains in ROM noted today. Progressing well toward stated goals of therapy.     Rehab Potential  Good    PT Frequency  2x / week    PT Duration  12 weeks    PT Treatment/Interventions  Patient/family education;ADLs/Self Care Home Management;Cryotherapy;Electrical Stimulation;Iontophoresis 4mg /ml Dexamethasone;Moist Heat;Ultrasound;Passive range of motion;Neuromuscular re-education;Manual techniques;Therapeutic  activities;Therapeutic exercise    PT Next Visit Plan  progress with shoulder rehab per protocol    Consulted and Agree with Plan of Care  Patient       Patient will benefit from skilled therapeutic intervention in order to improve the following deficits and impairments:  Postural dysfunction, Improper body mechanics, Pain, Increased fascial restricitons, Increased muscle spasms, Decreased mobility, Decreased range of motion, Decreased strength, Decreased activity tolerance  Visit Diagnosis: Chronic right shoulder pain  Muscle weakness (generalized)  Stiffness of right shoulder, not elsewhere classified  Other symptoms and signs involving the musculoskeletal system  Abnormal posture  Acute pain of right shoulder     Problem List Patient Active Problem List   Diagnosis Date Noted  . Acute bursitis of left shoulder 10/05/2018  . Lumbar disc disease 08/26/2018  . Preop exam for internal medicine 08/26/2018  . Dizziness 07/01/2018  . Nail, ingrown 04/13/2018  . Onychomycosis 04/13/2018  . 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/02/2018  . Right rotator cuff tear 01/05/2018  . Right cervical radiculopathy 12/12/2017  . Acute shoulder bursitis, right 12/12/2017  . Seasonal allergic rhinitis due to pollen 04/17/2017  . 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 Left-sided face pain 03/26/2017  . TMJ tenderness, left 02/05/2017  . Anemia 08/30/2016  . Parotitis, acute 03/26/2016  . Coccygeal pain, acute 02/28/2016  . Rash 10/18/2015  . Urine frequency 05/30/2015  . Lower back pain 05/30/2015  . Allergic conjunctivitis 03/02/2015  . Bursitis of right shoulder 05/09/2014  . Acute bronchitis 03/23/2014  . Seasonal allergic conjunctivitis 01/01/2014  . Food allergy 01/01/2014  . Conjunctivitis 09/12/2013  . Seasonal and perennial allergic rhinitis 09/12/2013  . Abdominal pain, other specified site 03/18/2013  . GERD (gastroesophageal reflux disease) 10/17/2012  . Encounter  for well adult exam with abnormal findings 04/07/2012  . POLYP, GALLBLADDER 11/30/2010  . SHOULDER PAIN, RIGHT 11/30/2010  . CERVICAL RADICULOPATHY, RIGHT 11/30/2010  . DEPRESSION 03/26/2010  . Headache(784.0) 03/26/2010  . SYNCOPE 06/07/2009  . Diabetes (HCC) 10/29/2007  . Hyperlipidemia 10/29/2007  . Anxiety state 10/29/2007  . COMMON MIGRAINE 10/29/2007  . Essential hypertension 10/29/2007  . BACK PAIN 10/29/2007    Brooklyne Radke Rober MinionP Eddy Liszewski PT, MPH  10/22/2018, 2:53 PM  [facility] [address] [place], [place], [address] Phone: [phone]   Fax:  [phone]  Name: Kittie PlaterSandy L Crawford MRN: [id number] Date of Birth: 10/05/1955

## 2018-10-22 NOTE — Patient Instructions (Addendum)
Isometric exercises standing at wall or doorway  Strengthening: Isometric External Rotation    Using wall to provide resistance, and keeping right arm at side, press back of hand into ball using light pressure. Hold __5__ seconds. Repeat _5-10___ times per set. Do __1__ sets per session. Do __1__ sessions per day.   Strengthening: Isometric Internal Rotation    Using door frame for resistance, press palm of right hand into ball using light pressure. Keep elbow in at side. Hold __5__ seconds. Repeat __5-10__ times per set. Do __1__ sets per session. Do __1__ sessions per day.   Strengthening: Isometric Extension    Using wall for resistance, press back of left arm into ball using light pressure. Hold __5__ seconds. Repeat __5-10__ times per set. Do __1__ sets per session. Do __1__ sessions per day.   Strengthening: Isometric Abduction    Using wall for resistance, press left arm into ball using light pressure. Hold __5__ seconds. Repeat __5-10__ times per set. Do _1___ sets per session. Do __1__ sessions per day.  Rhythmic stabilization  Lying on back with arm at 90 degrees from body, move arm slowly up and down; back and forth; circles CW/CCW  10-20 reps each - rest - repeat    Scapular: Retraction in External Rotation    With hands clasped behind head, elbows up, pull elbows back, pinching shoulder blades together gently allowing elbows to drop toward surface. Hold 30-60 sec Repeat __3__ times per set. Do __2__ sessions per day.  Lying on edge of bed - with help let arm drop off edge of bed straightening elbow  Hold 20-30 sec x 3 reps  Have help to lift arm back to bed.

## 2018-10-26 ENCOUNTER — Encounter: Payer: Self-pay | Admitting: Rehabilitative and Restorative Service Providers"

## 2018-10-26 ENCOUNTER — Ambulatory Visit (INDEPENDENT_AMBULATORY_CARE_PROVIDER_SITE_OTHER): Payer: BLUE CROSS/BLUE SHIELD | Admitting: Rehabilitative and Restorative Service Providers"

## 2018-10-26 DIAGNOSIS — M6281 Muscle weakness (generalized): Secondary | ICD-10-CM | POA: Diagnosis not present

## 2018-10-26 DIAGNOSIS — G8929 Other chronic pain: Secondary | ICD-10-CM

## 2018-10-26 DIAGNOSIS — R293 Abnormal posture: Secondary | ICD-10-CM

## 2018-10-26 DIAGNOSIS — R29898 Other symptoms and signs involving the musculoskeletal system: Secondary | ICD-10-CM | POA: Diagnosis not present

## 2018-10-26 DIAGNOSIS — M25611 Stiffness of right shoulder, not elsewhere classified: Secondary | ICD-10-CM

## 2018-10-26 DIAGNOSIS — M25511 Pain in right shoulder: Secondary | ICD-10-CM | POA: Diagnosis not present

## 2018-10-26 NOTE — Patient Instructions (Addendum)
Internal Rotator Cuff Stretch, Standing    Stand holding strap behind body, one arm above head, other arm bent behind back. With upper hand, pull gently upward. Hold _20__ seconds. Repeat _3__ times per session. Do __2_ sessions per day.  ELBOW: Biceps - Standing    Standing in doorway, place one hand on wall, elbow straight. Lean forward. Hold __20_ seconds. __3_ reps per set, _2__ sets per day

## 2018-10-26 NOTE — Therapy (Signed)
[facility] [place] [address] [place], [place], [address] Phone: [phone]   Fax:  [phone]  Physical Therapy Treatment  Patient Details  Name: Ruth Crawford MRN: [id number] Date of Birth: 1956/01/04 Referring Provider (PT): Dr Jones Broom    Encounter Date: 10/26/2018  PT End of Session - 10/26/18 0804    Visit Number  9    Number of Visits  12    Date for PT Re-Evaluation  12/14/18    PT Start Time  0756    PT Stop Time  0853    PT Time Calculation (min)  57 min    Activity Tolerance  Patient tolerated treatment well       Past Medical History:  Diagnosis Date  . Allergy   . ANXIETY   . COMMON MIGRAINE   . DEPRESSION   . DIABETES MELLITUS, TYPE II   . GERD (gastroesophageal reflux disease) 10/17/2012  . HYPERLIPIDEMIA   . HYPERTENSION   . POLYP, GALLBLADDER   . Sickle cell trait [facility])     Past Surgical History:  Procedure Laterality Date  . ABDOMINAL HYSTERECTOMY  2000  . COLONOSCOPY    . LUMBAR DISC SURGERY  1998, 2016, 2017   s/p    There were no vitals filed for this visit.  Subjective Assessment - 10/26/18 0805    Subjective  Shoulder and arm are feeling better. Working on her exercises at home. Making progress. She is using her arm for more things at home     Currently in Pain?  No/denies                       [facility] Adult PT Treatment/Exercise - 10/26/18 0001      Shoulder Exercises: Supine   Other Supine Exercises  chest lift 5 sec x 10 - abdominal core to avoid LBP        Shoulder Exercises: Prone   Retraction  Right;15 reps    Extension  AROM;Right;10 reps   engagning posterior shoulder girdle to neutral      Shoulder Exercises: Sidelying   External Rotation  AROM;Right;10 reps    ABduction  AROM;Right;10 reps    ABduction Limitations  to 90 deg       Shoulder Exercises: Standing   Flexion  AROM;AAROM;Right;15 reps   sliding hand up incline table    ABduction   AROM;Both;15 reps   scaption    Row  Strengthening;Right;Left;20 reps;Theraband    Theraband Level (Shoulder Row)  Level 2 (Red)    Row Limitations  periscapular strengthening through small range     Retraction  Strengthening;Both;10 reps   10 sec hold    Other Standing Exercises  scap retraction with noodle 10 sec x 10     Other Standing Exercises  shoulder rolls fwd/back x 10 each       Shoulder Exercises: Pulleys   Flexion  --   10 sec hold x 10 reps    Flexion Limitations  verbal cues to keep shoulder blade down     Scaption  --   10 sec hold x 10 reps    Scaption Limitations  verbal cues to keep shoulder blade down       Shoulder Exercises: Stretch   Internal Rotation Stretch  3 reps   10 sec with strap    External Rotation Stretch  5 reps;10 seconds   standing with cane    Other Shoulder Stretches  biceps  stretch 30 sec x 3     Other Shoulder Stretches  hands clasp behind head to stretch into ER 30 sec x 2 reps PT assist to change positions       Vasopneumatic   Number Minutes Vasopneumatic   15 minutes    Vasopnuematic Location   Shoulder    Vasopneumatic Pressure  Low    Vasopneumatic Temperature   34 deg       Manual Therapy   Soft tissue mobilization  soft tissue work through the [employer]; trap; teres; biceps; anterior shoulder      Scapular Mobilization  gentle Rt scapular motion     Passive ROM  Lt shoulder flexion; scaption; ER; extension - with elbow flexed and extended              PT Education - 10/26/18 16100832    Education Details  HEP     Person(s) Educated  Patient    Methods  Explanation;Demonstration;Tactile cues;Verbal cues;Handout    Comprehension  Verbalized understanding;Returned demonstration;Verbal cues required;Tactile cues required       PT Short Term Goals - 09/21/18 1347      PT SHORT TERM GOAL #1   Title  Pt will be independent in inital HEP per protocol 11/02/2018    Time  6    Period  Weeks    Status  New      PT SHORT TERM  GOAL #2   Title  Improve posture and alignment with patient to demonstrate improve upright posture and good position of scapulae along thoracic wall 11/02/2018    Time  6    Period  Weeks    Status  New      PT SHORT TERM GOAL #3   Title  [facility] 11/02/2018    Time  6    Period  Weeks    Status  New        PT Long Term Goals - 09/21/18 1350      PT LONG TERM GOAL #1   Title  Increase AROM Rt shoulder to within 5-10 deg of AROM Lt shoulder 12/14/2018    Time  12    Period  Weeks    Status  New      PT LONG TERM GOAL #2   Title  Return to light functional tasks using Rt UE 12/14/2018    Time  12    Period  Weeks    Status  New      PT LONG TERM GOAL #3   Title  Pt will report minimal to no pain with ADL's (with no lifting per protocol) 12/14/2018    Time  12    Period  Weeks    Status  New      PT LONG TERM GOAL #4   Title  Indpendent in HEP 12/14/2018    Time  12    Period  Weeks    Status  New      PT LONG TERM GOAL #5   Title  Improve FOTO to </= 48% limitation 12/14/2018    Time  12    Period  Weeks    Status  New            Plan - 10/26/18 0805    Clinical Impression Statement  Added active exercise and stretches without difficulty. Good gains in [facility] to [facility]. Progressing well toward stated goals of therapy.     Rehab  Potential  Good    PT Frequency  2x / week    PT Duration  12 weeks    PT Treatment/Interventions  Patient/family education;ADLs/Self Care Home Management;Cryotherapy;Electrical Stimulation;Iontophoresis 4mg /ml Dexamethasone;Moist Heat;Ultrasound;Passive range of motion;Neuromuscular re-education;Manual techniques;Therapeutic activities;Therapeutic exercise    PT Next Visit Plan  progress with shoulder rehab per protocol    Consulted and Agree with Plan of Care  Patient       Patient will benefit from skilled therapeutic intervention in order to improve the following deficits and impairments:   Postural dysfunction, Improper body mechanics, Pain, Increased fascial restricitons, Increased muscle spasms, Decreased mobility, Decreased range of motion, Decreased strength, Decreased activity tolerance  Visit Diagnosis: Chronic right shoulder pain  Muscle weakness (generalized)  Stiffness of right shoulder, not elsewhere classified  Other symptoms and signs involving the musculoskeletal system  Abnormal posture  Acute pain of right shoulder     Problem List Patient Active Problem List   Diagnosis Date Noted  . Acute bursitis of left shoulder 10/05/2018  . Lumbar disc disease 08/26/2018  . Preop exam for internal medicine 08/26/2018  . Dizziness 07/01/2018  . Nail, ingrown 04/13/2018  . Onychomycosis 04/13/2018  . 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/02/2018  . Right rotator cuff tear 01/05/2018  . Right cervical radiculopathy 12/12/2017  . Acute shoulder bursitis, right 12/12/2017  . Seasonal allergic rhinitis due to pollen 04/17/2017  . 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 Left-sided face pain 03/26/2017  . TMJ tenderness, left 02/05/2017  . Anemia 08/30/2016  . Parotitis, acute 03/26/2016  . Coccygeal pain, acute 02/28/2016  . Rash 10/18/2015  . Urine frequency 05/30/2015  . Lower back pain 05/30/2015  . Allergic conjunctivitis 03/02/2015  . Bursitis of right shoulder 05/09/2014  . Acute bronchitis 03/23/2014  . Seasonal allergic conjunctivitis 01/01/2014  . Food allergy 01/01/2014  . Conjunctivitis 09/12/2013  . Seasonal and perennial allergic rhinitis 09/12/2013  . Abdominal pain, other specified site 03/18/2013  . GERD (gastroesophageal reflux disease) 10/17/2012  . Encounter for well adult exam with abnormal findings 04/07/2012  . POLYP, GALLBLADDER 11/30/2010  . SHOULDER PAIN, RIGHT 11/30/2010  . CERVICAL RADICULOPATHY, RIGHT 11/30/2010  . DEPRESSION 03/26/2010  . Headache(784.0) 03/26/2010  . SYNCOPE 06/07/2009  . Diabetes (HCC) 10/29/2007   . Hyperlipidemia 10/29/2007  . Anxiety state 10/29/2007  . COMMON MIGRAINE 10/29/2007  . Essential hypertension 10/29/2007  . BACK PAIN 10/29/2007    Charlene Cowdrey Rober Minion PT, MPH  10/26/2018, 8:58 AM  [facility] [address] [place], [place], [address] Phone: [phone]   Fax:  [phone]  Name: Ruth Crawford MRN: [id number] Date of Birth: June 07, 1956

## 2018-10-29 ENCOUNTER — Ambulatory Visit (INDEPENDENT_AMBULATORY_CARE_PROVIDER_SITE_OTHER): Payer: BLUE CROSS/BLUE SHIELD | Admitting: Physical Therapy

## 2018-10-29 ENCOUNTER — Encounter: Payer: Self-pay | Admitting: Physical Therapy

## 2018-10-29 DIAGNOSIS — G8929 Other chronic pain: Secondary | ICD-10-CM

## 2018-10-29 DIAGNOSIS — M6281 Muscle weakness (generalized): Secondary | ICD-10-CM | POA: Diagnosis not present

## 2018-10-29 DIAGNOSIS — R29898 Other symptoms and signs involving the musculoskeletal system: Secondary | ICD-10-CM

## 2018-10-29 DIAGNOSIS — M25611 Stiffness of right shoulder, not elsewhere classified: Secondary | ICD-10-CM

## 2018-10-29 DIAGNOSIS — M25511 Pain in right shoulder: Secondary | ICD-10-CM | POA: Diagnosis not present

## 2018-10-29 NOTE — Therapy (Signed)
[facility] [place] [address] [place] [address] [place], [place], [address] Phone: [phone]   Fax:  [phone]  Physical Therapy Treatment  Patient Details  Name: Ruth Crawford MRN: [id number] Date of Birth: 11/04/1955 Referring Provider (PT): Dr Jones BroomJustin Chandler    Encounter Date: 10/29/2018  PT End of Session - 10/29/18 1448    Visit Number  10    Number of Visits  12    Date for PT Re-Evaluation  12/14/18    PT Start Time  1405    PT Stop Time  1458    PT Time Calculation (min)  53 min    Activity Tolerance  Patient tolerated treatment well    Behavior During Therapy  [facility] for tasks assessed/performed       Past Medical History:  Diagnosis Date  . Allergy   . ANXIETY   . COMMON MIGRAINE   . DEPRESSION   . DIABETES MELLITUS, TYPE II   . GERD (gastroesophageal reflux disease) 10/17/2012  . HYPERLIPIDEMIA   . HYPERTENSION   . POLYP, GALLBLADDER   . Sickle cell trait [facility](HCC)     Past Surgical History:  Procedure Laterality Date  . ABDOMINAL HYSTERECTOMY  2000  . COLONOSCOPY    . LUMBAR DISC SURGERY  1998, 2016, 2017   s/p    There were no vitals filed for this visit.  Subjective Assessment - 10/29/18 1407    Subjective  Pt reports she has been doing pretty well over last few days and hadn't had to take any medicine.  However, she woke up this morning very achy.  She attributes this to the rainy weather today.      Currently in Pain?  Yes    Pain Score  7     Pain Location  Shoulder    Pain Orientation  Right    Pain Descriptors / Indicators  Aching    Aggravating Factors   movement in the wrong way    Pain Relieving Factors  medication and ice          [facility] - 10/29/18 0001      Assessment   Medical Diagnosis  Rt RCR    Referring Provider (PT)  Dr Jones BroomJustin Chandler     Onset Date/Surgical Date  09/01/18    Hand Dominance  Right    Next MD Visit  11/23/18    Prior Therapy  yes here - 5/19           [facility] Adult PT Treatment/Exercise - 10/29/18 0001      Shoulder Exercises: Sidelying   External Rotation  AROM;Right;10 reps      Shoulder Exercises: Standing   Flexion  Right;15 reps;AAROM   sliding hand up incline table    ABduction  Right;15 reps   in front of mirror, to ~80 deg    Other Standing Exercises  shoulder rolls fwd/back x 10 each       Shoulder Exercises: Pulleys   Flexion  --   10 sec hold x 10 reps    Flexion Limitations  verbal cues to keep shoulder blade down     Scaption  --   10 sec hold x 10 reps    Scaption Limitations  verbal cues to keep shoulder blade down       Shoulder Exercises: Stretch   Table Stretch - External Rotation  --   10 reps, 5 sec holds.    Other Shoulder Stretches  stepping back at  counter to stretch into shoulder flexion passively 10 sec x 5     Other Shoulder Stretches  hands clasp behind head to stretch into ER 30 sec x 2 reps PTA assist to change positions       Vasopneumatic   Number Minutes Vasopneumatic   15 minutes    Vasopnuematic Location   Shoulder    Vasopneumatic Pressure  Low    Vasopneumatic Temperature   34 deg       Manual Therapy   Soft tissue mobilization  soft tissue work through the [employer]; trap; teres; biceps; anterior shoulder      Scapular Mobilization  gentle Rt scapular motion     Passive ROM  Rt shoulder flexion; scaption; ER; extension - with elbow flexed and extended                PT Short Term Goals - 09/21/18 1347      PT SHORT TERM GOAL #1   Title  Pt will be independent in inital HEP per protocol 11/02/2018    Time  6    Period  Weeks    Status  New      PT SHORT TERM GOAL #2   Title  Improve posture and alignment with patient to demonstrate improve upright posture and good position of scapulae along thoracic wall 11/02/2018    Time  6    Period  Weeks    Status  New      PT SHORT TERM GOAL #3   Title  PROM Rt shoulder gradually restored to [facility] 11/02/2018    Time  6     Period  Weeks    Status  New        PT Long Term Goals - 09/21/18 1350      PT LONG TERM GOAL #1   Title  Increase AROM Rt shoulder to within 5-10 deg of AROM Lt shoulder 12/14/2018    Time  12    Period  Weeks    Status  New      PT LONG TERM GOAL #2   Title  Return to light functional tasks using Rt UE 12/14/2018    Time  12    Period  Weeks    Status  New      PT LONG TERM GOAL #3   Title  Pt will report minimal to no pain with ADL's (with no lifting per protocol) 12/14/2018    Time  12    Period  Weeks    Status  New      PT LONG TERM GOAL #4   Title  Indpendent in HEP 12/14/2018    Time  12    Period  Weeks    Status  New      PT LONG TERM GOAL #5   Title  Improve FOTO to </= 48% limitation 12/14/2018    Time  12    Period  Weeks    Status  New            Plan - 10/29/18 1430    Clinical Impression Statement  Pt reported slight improvement in pain in Rt shoulder with exercise.  All exercises tolerated well.  Further reduction of pain with estim and vaso and end of session.  Pt progressing well through rehab protocol.      Rehab Potential  Good    PT Frequency  2x / week    PT Duration  12 weeks  PT Treatment/Interventions  Patient/family education;ADLs/Self Care Home Management;Cryotherapy;Electrical Stimulation;Iontophoresis 4mg /ml Dexamethasone;Moist Heat;Ultrasound;Passive range of motion;Neuromuscular re-education;Manual techniques;Therapeutic activities;Therapeutic exercise    PT Next Visit Plan  progress with shoulder rehab per protocol    Consulted and Agree with Plan of Care  Patient       Patient will benefit from skilled therapeutic intervention in order to improve the following deficits and impairments:  Postural dysfunction, Improper body mechanics, Pain, Increased fascial restricitons, Increased muscle spasms, Decreased mobility, Decreased range of motion, Decreased strength, Decreased activity tolerance  Visit Diagnosis: Chronic right  shoulder pain  Muscle weakness (generalized)  Stiffness of right shoulder, not elsewhere classified  Other symptoms and signs involving the musculoskeletal system     Problem List Patient Active Problem List   Diagnosis Date Noted  . Acute bursitis of left shoulder 10/05/2018  . Lumbar disc disease 08/26/2018  . Preop exam for internal medicine 08/26/2018  . Dizziness 07/01/2018  . Nail, ingrown 04/13/2018  . Onychomycosis 04/13/2018  . 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/02/2018  . Right rotator cuff tear 01/05/2018  . Right cervical radiculopathy 12/12/2017  . Acute shoulder bursitis, right 12/12/2017  . Seasonal allergic rhinitis due to pollen 04/17/2017  . 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 Left-sided face pain 03/26/2017  . TMJ tenderness, left 02/05/2017  . Anemia 08/30/2016  . Parotitis, acute 03/26/2016  . Coccygeal pain, acute 02/28/2016  . Rash 10/18/2015  . Urine frequency 05/30/2015  . Lower back pain 05/30/2015  . Allergic conjunctivitis 03/02/2015  . Bursitis of right shoulder 05/09/2014  . Acute bronchitis 03/23/2014  . Seasonal allergic conjunctivitis 01/01/2014  . Food allergy 01/01/2014  . Conjunctivitis 09/12/2013  . Seasonal and perennial allergic rhinitis 09/12/2013  . Abdominal pain, other specified site 03/18/2013  . GERD (gastroesophageal reflux disease) 10/17/2012  . Encounter for well adult exam with abnormal findings 04/07/2012  . POLYP, GALLBLADDER 11/30/2010  . SHOULDER PAIN, RIGHT 11/30/2010  . CERVICAL RADICULOPATHY, RIGHT 11/30/2010  . DEPRESSION 03/26/2010  . Headache(784.0) 03/26/2010  . SYNCOPE 06/07/2009  . Diabetes (HCC) 10/29/2007  . Hyperlipidemia 10/29/2007  . Anxiety state 10/29/2007  . COMMON MIGRAINE 10/29/2007  . Essential hypertension 10/29/2007  . BACK PAIN 10/29/2007   Mayer Camel, PTA 10/29/18 6:08 PM  [facility] [place] [address] [place] [address] [place], [place], [address] Phone: [phone]   Fax:  [phone]  Name: Ruth Crawford MRN: [id number] Date of Birth: September 06, 1956

## 2018-11-02 ENCOUNTER — Ambulatory Visit (INDEPENDENT_AMBULATORY_CARE_PROVIDER_SITE_OTHER): Payer: BLUE CROSS/BLUE SHIELD | Admitting: Physical Therapy

## 2018-11-02 ENCOUNTER — Encounter: Payer: Self-pay | Admitting: Physical Therapy

## 2018-11-02 DIAGNOSIS — M25511 Pain in right shoulder: Secondary | ICD-10-CM | POA: Diagnosis not present

## 2018-11-02 DIAGNOSIS — G8929 Other chronic pain: Secondary | ICD-10-CM

## 2018-11-02 DIAGNOSIS — M25611 Stiffness of right shoulder, not elsewhere classified: Secondary | ICD-10-CM | POA: Diagnosis not present

## 2018-11-02 DIAGNOSIS — M6281 Muscle weakness (generalized): Secondary | ICD-10-CM

## 2018-11-02 NOTE — Therapy (Signed)
[facility] [place] [address] [place] [address] [place], [place], [address] Phone: [phone]   Fax:  [phone]  Physical Therapy Treatment  Patient Details  Name: RAAVI STARLIPER MRN: [id number] Date of Birth: [birth year] Referring Provider (PT): Dr Jones Broom    Encounter Date: 11/02/2018  PT End of Session - 11/02/18 0801    Visit Number  11    Number of Visits  12    Date for PT Re-Evaluation  12/14/18    PT Start Time  0758    PT Stop Time  0858    PT Time Calculation (min)  60 min    Activity Tolerance  Patient tolerated treatment well    Behavior During Therapy  [facility] for tasks assessed/performed       Past Medical History:  Diagnosis Date  . Allergy   . ANXIETY   . COMMON MIGRAINE   . DEPRESSION   . DIABETES MELLITUS, TYPE II   . GERD (gastroesophageal reflux disease) 10/17/2012  . HYPERLIPIDEMIA   . HYPERTENSION   . POLYP, GALLBLADDER   . Sickle cell trait [facility]     Past Surgical History:  Procedure Laterality Date  . ABDOMINAL HYSTERECTOMY  2000  . COLONOSCOPY    . LUMBAR DISC SURGERY  1998, 2016, 2017   s/p    There were no vitals filed for this visit.  Subjective Assessment - 11/02/18 0802    Subjective  "Things are going pretty well.".  Pt reports she still has difficulty "reaching for things" and tries her best to not lift anything weighted.      Patient Stated Goals  use arm normally     Currently in Pain?  No/denies    Pain Score  0-No pain         [facility] PT Assessment - 11/02/18 0001      Assessment   Medical Diagnosis  Rt RCR    Referring Provider (PT)  Dr Jones Broom     Onset Date/Surgical Date  09/01/18    Hand Dominance  Right    Next MD Visit  11/23/18    Prior Therapy  yes here - 5/19       [facility] Adult PT Treatment/Exercise - 11/02/18 0001      Self-Care   Self-Care  Other Self-Care Comments    Other Self-Care Comments   Pt educated on self massage to Rt shoulder girdle (ie:  lat/subscap, pec, rhomboid) to assist in improvement in ROM and decreased pain level.  Pt verbalized understanding      Shoulder Exercises: Prone   Extension  AROM;Right;10 reps   engagning posterior shoulder girdle to neutral    External Rotation  AROM;Right;10 reps   shoulder abct ~60 deg, elbow flex 90 deg, ER to tolerance   Other Prone Exercises  prone row to netural x 10 with RUE      Shoulder Exercises: Standing   Other Standing Exercises  wall ladder in Rt scaption x 2 reps, to #24-25      Shoulder Exercises: Pulleys   Flexion  --   10 sec hold x 10 reps    Scaption  --   10 sec hold x 10 reps      Vasopneumatic   Number Minutes Vasopneumatic   15 minutes    Vasopnuematic Location   Shoulder    Vasopneumatic Pressure  Low    Vasopneumatic Temperature   34 deg       Manual Therapy   Soft  tissue mobilization  IASTM to Rt shoulder (ant/lateral/post, and periscapular muscles) to decrease fascial tightness and improve ROM;    Pin and stretch to Rt shoulder into scaption; STM to Rt subscap and lat.     Passive ROM  Rt shoulder flexion, scaption, ER          PT Short Term Goals - 09/21/18 1347      PT SHORT TERM GOAL #1   Title  Pt will be independent in inital HEP per protocol 11/02/2018    Time  6    Period  Weeks    Status  New      PT SHORT TERM GOAL #2   Title  Improve posture and alignment with patient to demonstrate improve upright posture and good position of scapulae along thoracic wall 11/02/2018    Time  6    Period  Weeks    Status  New      PT SHORT TERM GOAL #3   Title  PROM Rt shoulder gradually [facility] 11/02/2018    Time  6    Period  Weeks    Status  New        PT Long Term Goals - 09/21/18 1350      PT LONG TERM GOAL #1   Title  Increase AROM Rt shoulder to within 5-10 deg of AROM Lt shoulder 12/14/2018    Time  12    Period  Weeks    Status  New      PT LONG TERM GOAL #2   Title  Return to light functional tasks using Rt UE  12/14/2018    Time  12    Period  Weeks    Status  New      PT LONG TERM GOAL #3   Title  Pt will report minimal to no pain with ADL's (with no lifting per protocol) 12/14/2018    Time  12    Period  Weeks    Status  New      PT LONG TERM GOAL #4   Title  Indpendent in HEP 12/14/2018    Time  12    Period  Weeks    Status  New      PT LONG TERM GOAL #5   Title  Improve FOTO to </= 48% limitation 12/14/2018    Time  12    Period  Weeks    Status  New            Plan - 11/02/18 [id number]    Clinical Impression Statement  Pt tolerating all AROM and stretches for Rt shoulder well, with minimal increase in pain.  Pt continues to be limited in end range flexion, scaption, and ER.  Pt will benefit from continued PT intervention to maximize functional mobility.  Progressing g    Rehab Potential  Good    PT Frequency  2x / week    PT Duration  12 weeks    PT Treatment/Interventions  Patient/family education;ADLs/Self Care Home Management;Cryotherapy;Electrical Stimulation;Iontophoresis 4mg /ml Dexamethasone;Moist Heat;Ultrasound;Passive range of motion;Neuromuscular re-education;Manual techniques;Therapeutic activities;Therapeutic exercise    PT Next Visit Plan  end of POC, FOTO,assess goals,  progress per shoulder rehab protocol       Patient will benefit from skilled therapeutic intervention in order to improve the following deficits and impairments:  Postural dysfunction, Improper body mechanics, Pain, Increased fascial restricitons, Increased muscle spasms, Decreased mobility, Decreased range of motion, Decreased strength, Decreased activity tolerance  Visit Diagnosis: Chronic  right shoulder pain  Muscle weakness (generalized)  Stiffness of right shoulder, not elsewhere classified     Problem List Patient Active Problem List   Diagnosis Date Noted  . Acute bursitis of left shoulder 10/05/2018  . Lumbar disc disease 08/26/2018  . Preop exam for internal medicine 08/26/2018   . Dizziness 07/01/2018  . Nail, ingrown 04/13/2018  . Onychomycosis 04/13/2018  . 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/02/2018  . Right rotator cuff tear 01/05/2018  . Right cervical radiculopathy 12/12/2017  . Acute shoulder bursitis, right 12/12/2017  . Seasonal allergic rhinitis due to pollen 04/17/2017  . 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 Left-sided face pain 03/26/2017  . TMJ tenderness, left 02/05/2017  . Anemia 08/30/2016  . Parotitis, acute 03/26/2016  . Coccygeal pain, acute 02/28/2016  . Rash 10/18/2015  . Urine frequency 05/30/2015  . Lower back pain 05/30/2015  . Allergic conjunctivitis 03/02/2015  . Bursitis of right shoulder 05/09/2014  . Acute bronchitis 03/23/2014  . Seasonal allergic conjunctivitis 01/01/2014  . Food allergy 01/01/2014  . Conjunctivitis 09/12/2013  . Seasonal and perennial allergic rhinitis 09/12/2013  . Abdominal pain, other specified site 03/18/2013  . GERD (gastroesophageal reflux disease) 10/17/2012  . Encounter for well adult exam with abnormal findings 04/07/2012  . POLYP, GALLBLADDER 11/30/2010  . SHOULDER PAIN, RIGHT 11/30/2010  . CERVICAL RADICULOPATHY, RIGHT 11/30/2010  . DEPRESSION 03/26/2010  . Headache(784.0) 03/26/2010  . SYNCOPE 06/07/2009  . Diabetes (HCC) 10/29/2007  . Hyperlipidemia 10/29/2007  . Anxiety state 10/29/2007  . COMMON MIGRAINE 10/29/2007  . Essential hypertension 10/29/2007  . BACK PAIN 10/29/2007   Mayer Camel, PTA 11/02/18 1:37 PM  [facility] [place] [address] [place] [address] [place], [place], [address] Phone: [phone]   Fax:  [phone]  Name: ONYINYECHI CLAPSADDLE MRN: [id number] Date of Birth: 11-07-1955

## 2018-11-05 ENCOUNTER — Ambulatory Visit (INDEPENDENT_AMBULATORY_CARE_PROVIDER_SITE_OTHER): Payer: BLUE CROSS/BLUE SHIELD | Admitting: Rehabilitative and Restorative Service Providers"

## 2018-11-05 ENCOUNTER — Encounter: Payer: Self-pay | Admitting: Rehabilitative and Restorative Service Providers"

## 2018-11-05 DIAGNOSIS — M25511 Pain in right shoulder: Secondary | ICD-10-CM

## 2018-11-05 DIAGNOSIS — R29898 Other symptoms and signs involving the musculoskeletal system: Secondary | ICD-10-CM

## 2018-11-05 DIAGNOSIS — M6281 Muscle weakness (generalized): Secondary | ICD-10-CM | POA: Diagnosis not present

## 2018-11-05 DIAGNOSIS — G8929 Other chronic pain: Secondary | ICD-10-CM

## 2018-11-05 DIAGNOSIS — M25611 Stiffness of right shoulder, not elsewhere classified: Secondary | ICD-10-CM | POA: Diagnosis not present

## 2018-11-05 DIAGNOSIS — R293 Abnormal posture: Secondary | ICD-10-CM

## 2018-11-05 NOTE — Therapy (Signed)
[facility] [place] [address] [place] [address] [place], [place], [address] Phone: [phone]   Fax:  [phone]  Physical Therapy Treatment  Patient Details  Name: Ruth Crawford MRN: [id number] Date of Birth: 1956/06/30 Referring Provider (PT): Dr Jones Broom    Encounter Date: 11/05/2018  PT End of Session - 11/05/18 1403    Visit Number  12    Number of Visits  24    Date for PT Re-Evaluation  12/17/18    PT Start Time  1400    PT Stop Time  1459    PT Time Calculation (min)  59 min    Activity Tolerance  Patient tolerated treatment well       Past Medical History:  Diagnosis Date  . Allergy   . ANXIETY   . COMMON MIGRAINE   . DEPRESSION   . DIABETES MELLITUS, TYPE II   . GERD (gastroesophageal reflux disease) 10/17/2012  . HYPERLIPIDEMIA   . HYPERTENSION   . POLYP, GALLBLADDER   . Sickle cell trait [facility])     Past Surgical History:  Procedure Laterality Date  . ABDOMINAL HYSTERECTOMY  2000  . COLONOSCOPY    . LUMBAR DISC SURGERY  1998, 2016, 2017   s/p    There were no vitals filed for this visit.  Subjective Assessment - 11/05/18 1404    Subjective  Shoulder is feeling good today.     Currently in Pain?  No/denies         [facility] PT Assessment - 11/05/18 0001      Assessment   Medical Diagnosis  Rt RCR    Referring Provider (PT)  Dr Jones Broom     Onset Date/Surgical Date  09/01/18    Hand Dominance  Right    Next MD Visit  11/23/18    Prior Therapy  yes here - 5/19      Observation/Other Assessments   Focus on Therapeutic Outcomes (FOTO)   33% limitation       PROM   Right Shoulder Flexion  147 Degrees    Right Shoulder ABduction  128 Degrees   in scapular plane   Right Shoulder External Rotation  82 Degrees   in scapular plane      Strength   Overall Strength Comments  moving Rt UE against gravity - not tested resistively due to surgery                    [facility] Adult PT  Treatment/Exercise - 11/05/18 0001      Shoulder Exercises: Prone   Extension  AROM;Right;15 reps   engagning posterior shoulder girdle to neutral    Other Prone Exercises  prone row to netural x 15 with RUE      Shoulder Exercises: Sidelying   External Rotation  AROM;Right;15 reps    ABduction  AROM;Right;15 reps   through partial range verbal cues for scap position      Shoulder Exercises: Standing   Flexion  AROM;Both;10 reps   with noodle    ABduction  AROM;Both;10 reps   with noodle    Extension  Strengthening;Both;20 reps;Theraband    Theraband Level (Shoulder Extension)  Level 1 (Yellow)    Row  Strengthening;Right;Left;20 reps;Theraband    Theraband Level (Shoulder Row)  Level 1 (Yellow)      Shoulder Exercises: Pulleys   Flexion  --   10 sec hold x 10 reps    Scaption  --   10 sec hold  x 10 reps      Shoulder Exercises: Therapy Ball   Other Therapy Ball Exercises  stabilization ball on wall elbow bent shoudler blades down and back moving ball up/down; sidie to side; circles CW/CCW       Moist Heat Therapy   Number Minutes Moist Heat  15 Minutes    Moist Heat Location  Shoulder   Lt shoulder      Vasopneumatic   Number Minutes Vasopneumatic   15 minutes    Vasopnuematic Location   Shoulder    Vasopneumatic Pressure  Low    Vasopneumatic Temperature   34 deg       Manual Therapy   Soft tissue mobilization  soft tissue work Rt shoulder girdle including periscapular musculature; upper trap; pecs; anterior deltoid    Scapular Mobilization  Rt scapular motion     Passive ROM  Rt shoulder flexion, scaption, ER; IR                PT Short Term Goals - 11/05/18 1456      PT SHORT TERM GOAL #1   Title  Pt will be independent in inital HEP per protocol 11/02/2018    Time  6    Period  Weeks    Status  Achieved      PT SHORT TERM GOAL #2   Title  Improve posture and alignment with patient to demonstrate improve upright posture and good position of  scapulae along thoracic wall 11/02/2018    Time  6    Period  Weeks    Status  Achieved      PT SHORT TERM GOAL #3   Title  PROM Rt shoulder [facility] 11/02/2018    Time  6    Period  Weeks    Status  Achieved        PT Long Term Goals - 11/05/18 1446      PT LONG TERM GOAL #1   Title  Increase AROM Rt shoulder to within 5-10 deg of AROM Lt shoulder 12/17/2018    Time  18    Period  Weeks    Status  Revised      PT LONG TERM GOAL #2   Title  Return to light functional tasks using Rt UE 12/17/2018    Time  18    Period  Weeks    Status  Revised      PT LONG TERM GOAL #3   Title  Pt will report minimal to no pain with ADL's (with no lifting per protocol) 12/14/2018    Time  12    Period  Weeks    Status  Achieved      PT LONG TERM GOAL #4   Title  Indpendent in HEP 12/17/2018    Time  18    Period  Weeks    Status  Revised      PT LONG TERM GOAL #5   Title  Improve FOTO to </= 48% limitation 12/14/2018    Time  12    Period  Weeks    Status  Achieved            Plan - 11/05/18 1404    Clinical Impression Statement  Less pain; increasing mobility. Patient tolerated exercise and manual work without difficulty. Added ball on wall stabilization. Progressing well toward goals of rehab. Should progress to next phase of rehab next week.     Rehab Potential  Good  PT Frequency  2x / week    PT Duration  12 weeks    PT Treatment/Interventions  Patient/family education;ADLs/Self Care Home Management;Cryotherapy;Electrical Stimulation;Iontophoresis 4mg /ml Dexamethasone;Moist Heat;Ultrasound;Passive range of motion;Neuromuscular re-education;Manual techniques;Therapeutic activities;Therapeutic exercise    PT Next Visit Plan  end of POC, progress per shoulder rehab protocol       Patient will benefit from skilled therapeutic intervention in order to improve the following deficits and impairments:  Postural dysfunction, Improper body mechanics, Pain, Increased  fascial restricitons, Increased muscle spasms, Decreased mobility, Decreased range of motion, Decreased strength, Decreased activity tolerance  Visit Diagnosis: Chronic right shoulder pain - Plan: PT plan of care cert/re-cert  Muscle weakness (generalized) - Plan: PT plan of care cert/re-cert  Stiffness of right shoulder, not elsewhere classified - Plan: PT plan of care cert/re-cert  Other symptoms and signs involving the musculoskeletal system - Plan: PT plan of care cert/re-cert  Abnormal posture - Plan: PT plan of care cert/re-cert     Problem List Patient Active Problem List   Diagnosis Date Noted  . Acute bursitis of left shoulder 10/05/2018  . Lumbar disc disease 08/26/2018  . Preop exam for internal medicine 08/26/2018  . Dizziness 07/01/2018  . Nail, ingrown 04/13/2018  . Onychomycosis 04/13/2018  . 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/02/2018  . Right rotator cuff tear 01/05/2018  . Right cervical radiculopathy 12/12/2017  . Acute shoulder bursitis, right 12/12/2017  . Seasonal allergic rhinitis due to pollen 04/17/2017  . 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 Left-sided face pain 03/26/2017  . TMJ tenderness, left 02/05/2017  . Anemia 08/30/2016  . Parotitis, acute 03/26/2016  . Coccygeal pain, acute 02/28/2016  . Rash 10/18/2015  . Urine frequency 05/30/2015  . Lower back pain 05/30/2015  . Allergic conjunctivitis 03/02/2015  . Bursitis of right shoulder 05/09/2014  . Acute bronchitis 03/23/2014  . Seasonal allergic conjunctivitis 01/01/2014  . Food allergy 01/01/2014  . Conjunctivitis 09/12/2013  . Seasonal and perennial allergic rhinitis 09/12/2013  . Abdominal pain, other specified site 03/18/2013  . GERD (gastroesophageal reflux disease) 10/17/2012  . Encounter for well adult exam with abnormal findings 04/07/2012  . POLYP, GALLBLADDER 11/30/2010  . SHOULDER PAIN, RIGHT 11/30/2010  . CERVICAL RADICULOPATHY, RIGHT 11/30/2010  . DEPRESSION  03/26/2010  . Headache(784.0) 03/26/2010  . SYNCOPE 06/07/2009  . Diabetes (HCC) 10/29/2007  . Hyperlipidemia 10/29/2007  . Anxiety state 10/29/2007  . COMMON MIGRAINE 10/29/2007  . Essential hypertension 10/29/2007  . BACK PAIN 10/29/2007    Daryn Pisani Rober Minion PT, MPH  11/05/2018, 3:01 PM  [facility] [address] [place], [place], [address] Phone: [phone]   Fax:  [phone]  Name: Ruth Crawford MRN: [id number] Date of Birth: [birth year]

## 2018-11-09 ENCOUNTER — Ambulatory Visit: Payer: BLUE CROSS/BLUE SHIELD | Admitting: Family Medicine

## 2018-11-09 ENCOUNTER — Ambulatory Visit (INDEPENDENT_AMBULATORY_CARE_PROVIDER_SITE_OTHER): Payer: BLUE CROSS/BLUE SHIELD | Admitting: Physical Therapy

## 2018-11-09 ENCOUNTER — Encounter: Payer: Self-pay | Admitting: Physical Therapy

## 2018-11-09 DIAGNOSIS — G8929 Other chronic pain: Secondary | ICD-10-CM

## 2018-11-09 DIAGNOSIS — M25611 Stiffness of right shoulder, not elsewhere classified: Secondary | ICD-10-CM

## 2018-11-09 DIAGNOSIS — M25511 Pain in right shoulder: Secondary | ICD-10-CM

## 2018-11-09 DIAGNOSIS — R29898 Other symptoms and signs involving the musculoskeletal system: Secondary | ICD-10-CM | POA: Diagnosis not present

## 2018-11-09 DIAGNOSIS — D649 Anemia, unspecified: Secondary | ICD-10-CM | POA: Diagnosis not present

## 2018-11-09 DIAGNOSIS — M6281 Muscle weakness (generalized): Secondary | ICD-10-CM

## 2018-11-09 NOTE — Therapy (Signed)
[facility] [place] [facility] [address] [place], [place], [address] Phone: [phone]   Fax:  [phone]  Physical Therapy Treatment  Patient Details  Name: Ruth Crawford MRN: [id number] Date of Birth: May 30, 1956 Referring Provider (PT): Dr Jones Broom    Encounter Date: 11/09/2018  PT End of Session - 11/09/18 1105    Visit Number  13    Number of Visits  24    Date for PT Re-Evaluation  12/17/18    PT Start Time  1104    PT Stop Time  1150    PT Time Calculation (min)  46 min    Activity Tolerance  Patient tolerated treatment well    Behavior During Therapy  [facility] for tasks assessed/performed       Past Medical History:  Diagnosis Date  . Allergy   . ANXIETY   . COMMON MIGRAINE   . DEPRESSION   . DIABETES MELLITUS, TYPE II   . GERD (gastroesophageal reflux disease) 10/17/2012  . HYPERLIPIDEMIA   . HYPERTENSION   . POLYP, GALLBLADDER   . Sickle cell trait [facility])     Past Surgical History:  Procedure Laterality Date  . ABDOMINAL HYSTERECTOMY  2000  . COLONOSCOPY    . LUMBAR DISC SURGERY  1998, 2016, 2017   s/p    There were no vitals filed for this visit.  Subjective Assessment - 11/09/18 1106    Subjective  Pt reporting she can put things on/off shelves (overhead) with greater ease; she is careful with the weight of the items.  She can now put her phone in her back pocket without difficulty. She is now driving.  She returns to surgeon next.     Patient Stated Goals  use arm normally     Currently in Pain?  No/denies    Pain Score  0-No pain    Pain Orientation  Right         [facility] PT Assessment - 11/09/18 0001      Assessment   Medical Diagnosis  Rt RCR    Referring Provider (PT)  Dr Jones Broom     Onset Date/Surgical Date  09/01/18    Hand Dominance  Right    Next MD Visit  11/23/18    Prior Therapy  yes here - 5/19      AROM   Right/Left Shoulder  Right   in standing   Right Shoulder Extension   53 Degrees    Right Shoulder Flexion  125 Degrees   some compensatory motions noted   Right Shoulder ABduction  118 Degrees    Right Shoulder Internal Rotation  --   thumb to L1   Left Shoulder Extension  58 Degrees    Left Shoulder Flexion  144 Degrees    Left Shoulder ABduction  140 Degrees                   [facility] Adult PT Treatment/Exercise - 11/09/18 0001      Elbow Exercises   Elbow Flexion  Strengthening;Both;10 reps    Bar Weights/Barbell (Elbow Flexion)  3 lbs      Shoulder Exercises: Standing   External Rotation  Strengthening;Right;10 reps;Theraband   2 sets   Theraband Level (Shoulder External Rotation)  Level 1 (Yellow)    Internal Rotation  Strengthening;Right;10 reps;Theraband   2 sets   Theraband Level (Shoulder Internal Rotation)  Level 1 (Yellow)    Flexion  AROM;Both;10 reps   mirror and cues  to improve form.    Flexion Limitations  some compensation initially; improved with mirror and cues    ABduction  AROM;Both;10 reps   scaption; mirror and cues to improve form   Extension  Strengthening;Both;20 reps;Theraband    Theraband Level (Shoulder Extension)  Level 1 (Yellow)    Row  Strengthening;Right;Left;20 reps;Theraband    Theraband Level (Shoulder Row)  Level 1 (Yellow)    Other Standing Exercises  wall ladder in Rt scaption x 5 reps    Other Standing Exercises  gentle reverse shoulder pushups x 10 (pushing elbows into wall)       Shoulder Exercises: Pulleys   Flexion  --   10 sec hold x 10 reps    Scaption  --   10 sec hold x 10 reps      Shoulder Exercises: Therapy Ball   Other Therapy Ball Exercises  stabilization ball on wall elbow bent shoudler blades down and back moving ball up/down; sidie to side; circles CW/CCW       Modalities   Modalities  Vasopneumatic      Vasopneumatic   Number Minutes Vasopneumatic   10 minutes    Vasopnuematic Location   Shoulder   Rt   Vasopneumatic Pressure  Low    Vasopneumatic Temperature   34  deg       Manual Therapy   Soft tissue mobilization  --    Passive ROM  --               PT Short Term Goals - 11/05/18 1456      PT SHORT TERM GOAL #1   Title  Pt will be independent in inital HEP per protocol 11/02/2018    Time  6    Period  Weeks    Status  Achieved      PT SHORT TERM GOAL #2   Title  Improve posture and alignment with patient to demonstrate improve upright posture and good position of scapulae along thoracic wall 11/02/2018    Time  6    Period  Weeks    Status  Achieved      PT SHORT TERM GOAL #3   Title  PROM Rt shoulder gradually restored to [facility] 11/02/2018    Time  6    Period  Weeks    Status  Achieved        PT Long Term Goals - 11/05/18 1446      PT LONG TERM GOAL #1   Title  Increase AROM Rt shoulder to within 5-10 deg of AROM Lt shoulder 12/17/2018    Time  18    Period  Weeks    Status  Revised      PT LONG TERM GOAL #2   Title  Return to light functional tasks using Rt UE 12/17/2018    Time  18    Period  Weeks    Status  Revised      PT LONG TERM GOAL #3   Title  Pt will report minimal to no pain with ADL's (with no lifting per protocol) 12/14/2018    Time  12    Period  Weeks    Status  Achieved      PT LONG TERM GOAL #4   Title  Indpendent in HEP 12/17/2018    Time  18    Period  Weeks    Status  Revised      PT LONG TERM GOAL #5   Title  Improve  FOTO to </= 48% limitation 12/14/2018    Time  12    Period  Weeks    Status  Achieved            Plan - 11/09/18 1118    Clinical Impression Statement  Pt will be 10 wks s/p RTC repair; initiating phase 3 exercises per protocol without difficulty. Pt demonstrated decreased AROM in Rt shoulder. She tolerated all exercises with only minor increase in discomfort; reduced with use of vaso at end of session. Progressing toward goals.     PT Frequency  2x / week    PT Duration  12 weeks    PT Treatment/Interventions  Patient/family education;ADLs/Self Care Home  Management;Cryotherapy;Electrical Stimulation;Iontophoresis 4mg /ml Dexamethasone;Moist Heat;Ultrasound;Passive range of motion;Neuromuscular re-education;Manual techniques;Therapeutic activities;Therapeutic exercise    PT Next Visit Plan   progress per shoulder rehab protocol    Consulted and Agree with Plan of Care  Patient       Patient will benefit from skilled therapeutic intervention in order to improve the following deficits and impairments:  Postural dysfunction, Improper body mechanics, Pain, Increased fascial restricitons, Increased muscle spasms, Decreased mobility, Decreased range of motion, Decreased strength, Decreased activity tolerance  Visit Diagnosis: Chronic right shoulder pain  Muscle weakness (generalized)  Stiffness of right shoulder, not elsewhere classified  Other symptoms and signs involving the musculoskeletal system     Problem List Patient Active Problem List   Diagnosis Date Noted  . Acute bursitis of left shoulder 10/05/2018  . Lumbar disc disease 08/26/2018  . Preop exam for internal medicine 08/26/2018  . Dizziness 07/01/2018  . Nail, ingrown 04/13/2018  . Onychomycosis 04/13/2018  . 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/02/2018  . Right rotator cuff tear 01/05/2018  . Right cervical radiculopathy 12/12/2017  . Acute shoulder bursitis, right 12/12/2017  . Seasonal allergic rhinitis due to pollen 04/17/2017  . 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 Left-sided face pain 03/26/2017  . TMJ tenderness, left 02/05/2017  . Anemia 08/30/2016  . Parotitis, acute 03/26/2016  . Coccygeal pain, acute 02/28/2016  . Rash 10/18/2015  . Urine frequency 05/30/2015  . Lower back pain 05/30/2015  . Allergic conjunctivitis 03/02/2015  . Bursitis of right shoulder 05/09/2014  . Acute bronchitis 03/23/2014  . Seasonal allergic conjunctivitis 01/01/2014  . Food allergy 01/01/2014  . Conjunctivitis 09/12/2013  . Seasonal and perennial allergic rhinitis  09/12/2013  . Abdominal pain, other specified site 03/18/2013  . GERD (gastroesophageal reflux disease) 10/17/2012  . Encounter for well adult exam with abnormal findings 04/07/2012  . POLYP, GALLBLADDER 11/30/2010  . SHOULDER PAIN, RIGHT 11/30/2010  . CERVICAL RADICULOPATHY, RIGHT 11/30/2010  . DEPRESSION 03/26/2010  . Headache(784.0) 03/26/2010  . SYNCOPE 06/07/2009  . Diabetes (HCC) 10/29/2007  . Hyperlipidemia 10/29/2007  . Anxiety state 10/29/2007  . COMMON MIGRAINE 10/29/2007  . Essential hypertension 10/29/2007  . BACK PAIN 10/29/2007   Mayer Camel, PTA 11/09/18 11:46 AM  [facility] [address] [place], [place], [address] Phone: [phone]   Fax:  [phone]  Name: WYVONNE HOLDER MRN: [id number] Date of Birth: [birth year]

## 2018-11-12 ENCOUNTER — Encounter: Payer: Self-pay | Admitting: Rehabilitative and Restorative Service Providers"

## 2018-11-12 ENCOUNTER — Encounter: Payer: Self-pay | Admitting: Internal Medicine

## 2018-11-12 ENCOUNTER — Ambulatory Visit (INDEPENDENT_AMBULATORY_CARE_PROVIDER_SITE_OTHER): Payer: BLUE CROSS/BLUE SHIELD | Admitting: Rehabilitative and Restorative Service Providers"

## 2018-11-12 DIAGNOSIS — M25611 Stiffness of right shoulder, not elsewhere classified: Secondary | ICD-10-CM

## 2018-11-12 DIAGNOSIS — R293 Abnormal posture: Secondary | ICD-10-CM

## 2018-11-12 DIAGNOSIS — M25511 Pain in right shoulder: Secondary | ICD-10-CM | POA: Diagnosis not present

## 2018-11-12 DIAGNOSIS — R29898 Other symptoms and signs involving the musculoskeletal system: Secondary | ICD-10-CM | POA: Diagnosis not present

## 2018-11-12 DIAGNOSIS — M6281 Muscle weakness (generalized): Secondary | ICD-10-CM | POA: Diagnosis not present

## 2018-11-12 DIAGNOSIS — G8929 Other chronic pain: Secondary | ICD-10-CM

## 2018-11-12 MED ORDER — AZELASTINE HCL 0.05 % OP SOLN: 1.0000 [drp] | Freq: Two times a day (BID) | OPHTHALMIC | 12 refills | Status: DC

## 2018-11-12 NOTE — Therapy (Signed)
[facility] [place] [address] [place], [place], [address] Phone: [phone]   Fax:  [phone]  Physical Therapy Treatment  Patient Details  Name: Ruth Crawford MRN: [id number] Date of Birth: Nov 05, 1955 Referring Provider (PT): Dr Jones Broom    Encounter Date: 11/12/2018  PT End of Session - 11/12/18 1406    Visit Number  14    Number of Visits  24    Date for PT Re-Evaluation  12/17/18    PT Start Time  1401    PT Stop Time  1456    PT Time Calculation (min)  55 min    Activity Tolerance  Patient tolerated treatment well       Past Medical History:  Diagnosis Date  . Allergy   . ANXIETY   . COMMON MIGRAINE   . DEPRESSION   . DIABETES MELLITUS, TYPE II   . GERD (gastroesophageal reflux disease) 10/17/2012  . HYPERLIPIDEMIA   . HYPERTENSION   . POLYP, GALLBLADDER   . Sickle cell trait [facility])     Past Surgical History:  Procedure Laterality Date  . ABDOMINAL HYSTERECTOMY  2000  . COLONOSCOPY    . LUMBAR DISC SURGERY  1998, 2016, 2017   s/p    There were no vitals filed for this visit.  Subjective Assessment - 11/12/18 1406    Subjective  Patient reports that her shoulder is doing well. Seems a lot better. Using arm for more functional activities.     Currently in Pain?  No/denies                       [facility] Adult PT Treatment/Exercise - 11/12/18 0001      Shoulder Exercises: Standing   External Rotation  Strengthening;Right;20 reps;Theraband   2 sets   Theraband Level (Shoulder External Rotation)  Level 1 (Yellow)    Internal Rotation  Strengthening;Right;20 reps;Theraband   2 sets   Theraband Level (Shoulder Internal Rotation)  Level 1 (Yellow)    Flexion  AROM;Both;20 reps   mirror and cues to improve form.    ABduction  AROM;Both;20 reps   scaption; mirror and cues to improve form   Extension  Strengthening;Both;20 reps;Theraband    Theraband Level (Shoulder Extension)  Level 1  (Yellow)    Row  Strengthening;Right;Left;20 reps;Theraband    Theraband Level (Shoulder Row)  Level 1 (Yellow)    Other Standing Exercises  sliding Rt hand up wall - tapping fingers on wall ~ 20-30 sec x 5 reps verbal and tactile cues to avoid elevation of shoulder girdle       Shoulder Exercises: Pulleys   Flexion  --   10 sec hold x 10 reps    Scaption  --   10 sec hold x 10 reps      Shoulder Exercises: Therapy Ball   Other Therapy Ball Exercises  stabilization ball on wall elbow bent shoudler blades down and back moving ball up/down; sidie to side; circles CW/CCW     Other Therapy Ball Exercises  press into large red ball at side working on scapular depression 5 sec hold x 10 reps x 2 sets       Vasopneumatic   Number Minutes Vasopneumatic   15 minutes    Vasopnuematic Location   Shoulder   Rt   Vasopneumatic Pressure  Low    Vasopneumatic Temperature   34 deg       Manual Therapy   Soft tissue  mobilization  soft tissue work Rt shoulder upper trap; pecs; anterior deltoid    Passive ROM  Rt shoulder flexion, extension, scaption, ER and IR in scapular plane                 PT Short Term Goals - 11/05/18 1456      PT SHORT TERM GOAL #1   Title  Pt will be independent in inital HEP per protocol 11/02/2018    Time  6    Period  Weeks    Status  Achieved      PT SHORT TERM GOAL #2   Title  Improve posture and alignment with patient to demonstrate improve upright posture and good position of scapulae along thoracic wall 11/02/2018    Time  6    Period  Weeks    Status  Achieved      PT SHORT TERM GOAL #3   Title  PROM Rt shoulder gradually restored to [facility] 11/02/2018    Time  6    Period  Weeks    Status  Achieved        PT Long Term Goals - 11/05/18 1446      PT LONG TERM GOAL #1   Title  Increase AROM Rt shoulder to within 5-10 deg of AROM Lt shoulder 12/17/2018    Time  18    Period  Weeks    Status  Revised      PT LONG TERM GOAL #2   Title  Return to  light functional tasks using Rt UE 12/17/2018    Time  18    Period  Weeks    Status  Revised      PT LONG TERM GOAL #3   Title  Pt will report minimal to no pain with ADL's (with no lifting per protocol) 12/14/2018    Time  12    Period  Weeks    Status  Achieved      PT LONG TERM GOAL #4   Title  Indpendent in HEP 12/17/2018    Time  18    Period  Weeks    Status  Revised      PT LONG TERM GOAL #5   Title  Improve FOTO to </= 48% limitation 12/14/2018    Time  12    Period  Weeks    Status  Achieved            Plan - 11/12/18 1406    Clinical Impression Statement  Continued progress with strengthening and ROM activities. Working on posterior shoulder gridle control - [name] continues to demonstrate elevation of Rt shoulder with elevation of Rt UE. She will benefit from continued PT to reach maximum rehab potential.     PT Frequency  2x / week    PT Duration  12 weeks    PT Treatment/Interventions  Patient/family education;ADLs/Self Care Home Management;Cryotherapy;Electrical Stimulation;Iontophoresis 4mg /ml Dexamethasone;Moist Heat;Ultrasound;Passive range of motion;Neuromuscular re-education;Manual techniques;Therapeutic activities;Therapeutic exercise    PT Next Visit Plan   progress per shoulder rehab protocol - reassessment for MD note prior to RTD 11/22/2018    Consulted and Agree with Plan of Care  Patient       Patient will benefit from skilled therapeutic intervention in order to improve the following deficits and impairments:  Postural dysfunction, Improper body mechanics, Pain, Increased fascial restricitons, Increased muscle spasms, Decreased mobility, Decreased range of motion, Decreased strength, Decreased activity tolerance  Visit Diagnosis: Chronic right shoulder pain  Muscle weakness (  generalized)  Stiffness of right shoulder, not elsewhere classified  Other symptoms and signs involving the musculoskeletal system  Abnormal posture  Acute pain of right  shoulder     Problem List Patient Active Problem List   Diagnosis Date Noted  . Acute bursitis of left shoulder 10/05/2018  . Lumbar disc disease 08/26/2018  . Preop exam for internal medicine 08/26/2018  . Dizziness 07/01/2018  . Nail, ingrown 04/13/2018  . Onychomycosis 04/13/2018  . 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/02/2018  . Right rotator cuff tear 01/05/2018  . Right cervical radiculopathy 12/12/2017  . Acute shoulder bursitis, right 12/12/2017  . Seasonal allergic rhinitis due to pollen 04/17/2017  . 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 Left-sided face pain 03/26/2017  . TMJ tenderness, left 02/05/2017  . Anemia 08/30/2016  . Parotitis, acute 03/26/2016  . Coccygeal pain, acute 02/28/2016  . Rash 10/18/2015  . Urine frequency 05/30/2015  . Lower back pain 05/30/2015  . Allergic conjunctivitis 03/02/2015  . Bursitis of right shoulder 05/09/2014  . Acute bronchitis 03/23/2014  . Seasonal allergic conjunctivitis 01/01/2014  . Food allergy 01/01/2014  . Conjunctivitis 09/12/2013  . Seasonal and perennial allergic rhinitis 09/12/2013  . Abdominal pain, other specified site 03/18/2013  . GERD (gastroesophageal reflux disease) 10/17/2012  . Encounter for well adult exam with abnormal findings 04/07/2012  . POLYP, GALLBLADDER 11/30/2010  . SHOULDER PAIN, RIGHT 11/30/2010  . CERVICAL RADICULOPATHY, RIGHT 11/30/2010  . DEPRESSION 03/26/2010  . Headache(784.0) 03/26/2010  . SYNCOPE 06/07/2009  . Diabetes (HCC) 10/29/2007  . Hyperlipidemia 10/29/2007  . Anxiety state 10/29/2007  . COMMON MIGRAINE 10/29/2007  . Essential hypertension 10/29/2007  . BACK PAIN 10/29/2007    Lianette Broussard Rober Minion PT, MPH  11/12/2018, 2:59 PM  [facility] [address] [place], [place], [address] Phone: [phone]   Fax:  [phone]  Name: Ruth Crawford MRN: [id number] Date of Birth: [birth year]

## 2018-11-16 ENCOUNTER — Ambulatory Visit (INDEPENDENT_AMBULATORY_CARE_PROVIDER_SITE_OTHER): Payer: BLUE CROSS/BLUE SHIELD | Admitting: Rehabilitative and Restorative Service Providers"

## 2018-11-16 ENCOUNTER — Encounter: Payer: Self-pay | Admitting: Rehabilitative and Restorative Service Providers"

## 2018-11-16 DIAGNOSIS — M25511 Pain in right shoulder: Secondary | ICD-10-CM | POA: Diagnosis not present

## 2018-11-16 DIAGNOSIS — M6281 Muscle weakness (generalized): Secondary | ICD-10-CM | POA: Diagnosis not present

## 2018-11-16 DIAGNOSIS — R29898 Other symptoms and signs involving the musculoskeletal system: Secondary | ICD-10-CM

## 2018-11-16 DIAGNOSIS — M25611 Stiffness of right shoulder, not elsewhere classified: Secondary | ICD-10-CM | POA: Diagnosis not present

## 2018-11-16 DIAGNOSIS — G8929 Other chronic pain: Secondary | ICD-10-CM

## 2018-11-16 DIAGNOSIS — R293 Abnormal posture: Secondary | ICD-10-CM

## 2018-11-16 DIAGNOSIS — Z809 Family history of malignant neoplasm, unspecified: Secondary | ICD-10-CM | POA: Diagnosis not present

## 2018-11-16 NOTE — Therapy (Signed)
[facility] [address] [place], [place], [address] Phone: [phone]   Fax:  [phone]  Physical Therapy Treatment  Patient Details  Name: Ruth Crawford MRN: [id number] Date of Birth: 09-30-1955 Referring Provider (PT): Dr Jones Broom    Encounter Date: 11/16/2018  PT End of Session - 11/16/18 0800    Visit Number  15    Number of Visits  24    Date for PT Re-Evaluation  12/17/18    PT Start Time  0758    PT Stop Time  0852    PT Time Calculation (min)  54 min    Activity Tolerance  Patient tolerated treatment well       Past Medical History:  Diagnosis Date  . Allergy   . ANXIETY   . COMMON MIGRAINE   . DEPRESSION   . DIABETES MELLITUS, TYPE II   . GERD (gastroesophageal reflux disease) 10/17/2012  . HYPERLIPIDEMIA   . HYPERTENSION   . POLYP, GALLBLADDER   . Sickle cell trait [facility])     Past Surgical History:  Procedure Laterality Date  . ABDOMINAL HYSTERECTOMY  2000  . COLONOSCOPY    . LUMBAR DISC SURGERY  1998, 2016, 2017   s/p    There were no vitals filed for this visit.  Subjective Assessment - 11/16/18 0802    Subjective  No pain - just some tightness. Using Rt UE for more functional activities.     Currently in Pain?  No/denies                       [facility] Adult PT Treatment/Exercise - 11/16/18 0001      Shoulder Exercises: Standing   External Rotation  Strengthening;Right;10 reps;Theraband   3 sets   Theraband Level (Shoulder External Rotation)  Level 1 (Yellow)    Internal Rotation  Strengthening;Right;20 reps;Theraband   3 sets   Theraband Level (Shoulder Internal Rotation)  Level 1 (Yellow)    Extension  Strengthening;Both;20 reps;Theraband    Theraband Level (Shoulder Extension)  Level 2 (Red)    Row  Strengthening;Right;Left;20 reps;Theraband    Theraband Level (Shoulder Row)  Level 2 (Red)    Other Standing Exercises  sliding Rt hand up wall - tapping fingers  on wall ~ 20-30 sec x 5 reps verbal and tactile cues to avoid elevation of shoulder girdle     Other Standing Exercises  sliding Rt hand up incline table working on posterior shoulder girdle control 10 reps x 2 sets       Shoulder Exercises: Pulleys   Flexion  --   10 sec hold x 10 reps    Scaption  --   10 sec hold x 10 reps      Shoulder Exercises: Therapy Ball   Other Therapy Ball Exercises  stabilization ball on wall elbow bent shoudler blades down and back moving ball up/down; sidie to side; circles CW/CCW     Other Therapy Ball Exercises  press into large red ball at side working on scapular depression 5 sec hold x 10 reps x 2 sets       Shoulder Exercises: Lawyer Limitations  doorway stretch 3 positions 30 sec hold x 3 reps each position - to pt tolerance      Vasopneumatic   Number Minutes Vasopneumatic   15 minutes    Vasopnuematic Location   Shoulder   Rt   Vasopneumatic Pressure  Low  Vasopneumatic Temperature   34 deg       Manual Therapy   Soft tissue mobilization  soft tissue work Rt shoulder upper trap; pecs; anterior deltoid    Passive ROM  Rt shoulder flexion, extension, scaption, ER and IR in scapular plane               PT Education - 11/16/18 0822    Education Details  HEP     Person(s) Educated  Patient    Methods  Explanation;Demonstration;Tactile cues;Verbal cues;Handout    Comprehension  Verbalized understanding;Returned demonstration;Verbal cues required;Tactile cues required       PT Short Term Goals - 11/05/18 1456      PT SHORT TERM GOAL #1   Title  Pt will be independent in inital HEP per protocol 11/02/2018    Time  6    Period  Weeks    Status  Achieved      PT SHORT TERM GOAL #2   Title  Improve posture and alignment with patient to demonstrate improve upright posture and good position of scapulae along thoracic wall 11/02/2018    Time  6    Period  Weeks    Status  Achieved      PT SHORT TERM GOAL #3   Title   PROM Rt shoulder gradually restored to [facility] 11/02/2018    Time  6    Period  Weeks    Status  Achieved        PT Long Term Goals - 11/05/18 1446      PT LONG TERM GOAL #1   Title  Increase AROM Rt shoulder to within 5-10 deg of AROM Lt shoulder 12/17/2018    Time  18    Period  Weeks    Status  Revised      PT LONG TERM GOAL #2   Title  Return to light functional tasks using Rt UE 12/17/2018    Time  18    Period  Weeks    Status  Revised      PT LONG TERM GOAL #3   Title  Pt will report minimal to no pain with ADL's (with no lifting per protocol) 12/14/2018    Time  12    Period  Weeks    Status  Achieved      PT LONG TERM GOAL #4   Title  Indpendent in HEP 12/17/2018    Time  18    Period  Weeks    Status  Revised      PT LONG TERM GOAL #5   Title  Improve FOTO to </= 48% limitation 12/14/2018    Time  12    Period  Weeks    Status  Achieved            Plan - 11/16/18 0801    Clinical Impression Statement  progressing well with shoulder rehab. Gaining strength and ROM. Continues to need work on posterior shoulder girdle strength.     PT Frequency  2x / week    PT Duration  12 weeks    PT Treatment/Interventions  Patient/family education;ADLs/Self Care Home Management;Cryotherapy;Electrical Stimulation;Iontophoresis 4mg /ml Dexamethasone;Moist Heat;Ultrasound;Passive range of motion;Neuromuscular re-education;Manual techniques;Therapeutic activities;Therapeutic exercise    PT Next Visit Plan   progress per shoulder rehab protocol - reassessment for MD note prior to RTD 11/22/2018    Consulted and Agree with Plan of Care  Patient       Patient will benefit from skilled therapeutic intervention in  order to improve the following deficits and impairments:  Postural dysfunction, Improper body mechanics, Pain, Increased fascial restricitons, Increased muscle spasms, Decreased mobility, Decreased range of motion, Decreased strength, Decreased activity tolerance  Visit  Diagnosis: Chronic right shoulder pain  Muscle weakness (generalized)  Stiffness of right shoulder, not elsewhere classified  Other symptoms and signs involving the musculoskeletal system  Abnormal posture  Acute pain of right shoulder     Problem List Patient Active Problem List   Diagnosis Date Noted  . Acute bursitis of left shoulder 10/05/2018  . Lumbar disc disease 08/26/2018  . Preop exam for internal medicine 08/26/2018  . Dizziness 07/01/2018  . Nail, ingrown 04/13/2018  . Onychomycosis 04/13/2018  . 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/02/2018  . Right rotator cuff tear 01/05/2018  . Right cervical radiculopathy 12/12/2017  . Acute shoulder bursitis, right 12/12/2017  . Seasonal allergic rhinitis due to pollen 04/17/2017  . 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 Left-sided face pain 03/26/2017  . TMJ tenderness, left 02/05/2017  . Anemia 08/30/2016  . Parotitis, acute 03/26/2016  . Coccygeal pain, acute 02/28/2016  . Rash 10/18/2015  . Urine frequency 05/30/2015  . Lower back pain 05/30/2015  . Allergic conjunctivitis 03/02/2015  . Bursitis of right shoulder 05/09/2014  . Acute bronchitis 03/23/2014  . Seasonal allergic conjunctivitis 01/01/2014  . Food allergy 01/01/2014  . Conjunctivitis 09/12/2013  . Seasonal and perennial allergic rhinitis 09/12/2013  . Abdominal pain, other specified site 03/18/2013  . GERD (gastroesophageal reflux disease) 10/17/2012  . Encounter for well adult exam with abnormal findings 04/07/2012  . POLYP, GALLBLADDER 11/30/2010  . SHOULDER PAIN, RIGHT 11/30/2010  . CERVICAL RADICULOPATHY, RIGHT 11/30/2010  . DEPRESSION 03/26/2010  . Headache(784.0) 03/26/2010  . SYNCOPE 06/07/2009  . Diabetes (HCC) 10/29/2007  . Hyperlipidemia 10/29/2007  . Anxiety state 10/29/2007  . COMMON MIGRAINE 10/29/2007  . Essential hypertension 10/29/2007  . BACK PAIN 10/29/2007    Avantae Bither Rober Minion PT, MPH  11/16/2018, 8:45 AM  [facility] [address] [place], [place], [address] Phone: [phone]   Fax:  [phone]  Name: KARIS TEEGARDEN MRN: [id number] Date of Birth: [birth year]

## 2018-11-16 NOTE — Patient Instructions (Signed)
Scapula Adduction With Pectorals, Low   Stand in doorframe with palms against frame and arms at 45. Lean forward and squeeze shoulder blades. Hold _30__ seconds. Repeat _3__ times per session. Do _2-3 sessions per day.  Scapula Adduction With Pectorals, Mid-Range   Stand in doorframe with palms against frame and arms at 90. Lean forward and squeeze shoulder blades. Hold _30_ seconds. Repeat __3_ times per session. Do _2-3__ sessions per day.  \Scapula Adduction With Pectorals, High   Stand in doorframe with palms against frame and arms at 120. Lean forward and squeeze shoulder blades. Hold _30__ seconds. Repeat __3_ times per session. Do _2-3__ sessions per day.  Marland Kitchen

## 2018-11-18 ENCOUNTER — Ambulatory Visit (INDEPENDENT_AMBULATORY_CARE_PROVIDER_SITE_OTHER): Payer: BLUE CROSS/BLUE SHIELD | Admitting: Family Medicine

## 2018-11-18 ENCOUNTER — Ambulatory Visit (INDEPENDENT_AMBULATORY_CARE_PROVIDER_SITE_OTHER)
Admission: RE | Admit: 2018-11-18 | Discharge: 2018-11-18 | Disposition: A | Discharge status: Home / Self Care | Payer: BLUE CROSS/BLUE SHIELD | Source: Ambulatory Visit | Attending: Family Medicine | Admitting: Family Medicine

## 2018-11-18 ENCOUNTER — Encounter: Payer: Self-pay | Admitting: Family Medicine

## 2018-11-18 VITALS — BP 140/90 | HR 87 | Ht 62.0 in | Wt 126.0 lb

## 2018-11-18 DIAGNOSIS — G8929 Other chronic pain: Secondary | ICD-10-CM

## 2018-11-18 DIAGNOSIS — M75111 Incomplete rotator cuff tear or rupture of right shoulder, not specified as traumatic: Secondary | ICD-10-CM | POA: Diagnosis not present

## 2018-11-18 DIAGNOSIS — M25512 Pain in left shoulder: Secondary | ICD-10-CM

## 2018-11-18 NOTE — Progress Notes (Signed)
Ruth Crawford [address] [place], [place] [address] Phone: [phone] Subjective:   I Ruth Crawford am serving as a Neurosurgeon for Ruth Crawford.  I'm seeing this patient by the request  of:    CC: Left shoulder pain follow-up  TDS:[id number]     Updated 11/18/2018 Ruth Crawford is a 63 y.o. female coming in with complaint of left shoulder pain. States everything is going well. Still going to PT. She is satisfied with her progress. 80.90% better.  Still some mild discomfort.  Nothing severe.  Happy with results so far.  Following up on her right shoulder with an orthopedic surgeon to discuss if surgical intervention is necessary.    Past Medical History:  Diagnosis Date  . Allergy   . ANXIETY   . COMMON MIGRAINE   . DEPRESSION   . DIABETES MELLITUS, TYPE II   . GERD (gastroesophageal reflux disease) 10/17/2012  . HYPERLIPIDEMIA   . HYPERTENSION   . POLYP, GALLBLADDER   . Sickle cell trait [facility])    Past Surgical History:  Procedure Laterality Date  . ABDOMINAL HYSTERECTOMY  2000  . COLONOSCOPY    . LUMBAR DISC SURGERY  1998, 2016, 2017   s/p   Social History   Socioeconomic History  . Marital status: Married    Spouse name: Not on file  . Number of children: 1  . Years of education: Not on file  . Highest education level: Not on file  Occupational History  . Occupation: Teacher, music    Comment: at [employer]  Social Needs  . Financial resource strain: Not on file  . Food insecurity:    Worry: Not on file    Inability: Not on file  . Transportation needs:    Medical: Not on file    Non-medical: Not on file  Tobacco Use  . Smoking status: Never Smoker  . Smokeless tobacco: Never Used  Substance and Sexual Activity  . Alcohol use: Yes    Comment: occasional use  . Drug use: No  . Sexual activity: Not on file  Lifestyle  . Physical activity:    Days per week: Not on file    Minutes per session: Not on file  .  Stress: Not on file  Relationships  . Social connections:    Talks on phone: Not on file    Gets together: Not on file    Attends religious service: Not on file    Active member of club or organization: Not on file    Attends meetings of clubs or organizations: Not on file    Relationship status: Not on file  Other Topics Concern  . Not on file  Social History Narrative  . Not on file   Allergies  Allergen Reactions  . Demerol [Meperidine] Other (See Comments)    Per pt: unknown  . Prednisone Itching and Rash   Family History  Problem Relation Age of Onset  . Diabetes Mother   . Heart disease Father   . Stroke Other   . Alcohol abuse Other   . Arthritis Other   . Colon cancer Neg Hx   . Esophageal cancer Neg Hx   . Rectal cancer Neg Hx   . Stomach cancer Neg Hx     Current Outpatient Medications (Endocrine & Metabolic):  Marland Kitchen  Exenatide ER (BYDUREON) 2 MG PEN, Inject 2 mg into the skin once a week. Marland Kitchen  glipiZIDE (GLIPIZIDE XL) 5 MG 24  hr tablet, Take 1 tablet (5 mg total) by mouth 2 (two) times daily. .  metFORMIN (GLUCOPHAGE) 1000 MG tablet, TAKE 1 TABLET BY MOUTH TWICE DAILY WITH MEALS  Current Outpatient Medications (Cardiovascular):  .  lisinopril (PRINIVIL,ZESTRIL) 20 MG tablet, Take 1 tablet (20 mg total) by mouth daily. Marland Kitchen  lovastatin (MEVACOR) 40 MG tablet, TAKE 1 TABLET BY MOUTH ONCE DAILY  Current Outpatient Medications (Respiratory):  .  diphenhydrAMINE (BENADRYL) 25 MG tablet, Take 25 mg by mouth daily as needed. .  phenylephrine (SUDAFED PE) 10 MG TABS tablet, Take 10 mg by mouth daily.  Current Outpatient Medications (Analgesics):  Marland Kitchen  Aspirin-Acetaminophen-Caffeine (EXCEDRIN MIGRAINE PO), Take by mouth as needed.   Current Outpatient Medications (Other):  .  azelastine (OPTIVAR) 0.05 % ophthalmic solution, Place 1 drop into both eyes 2 (two) times daily. .  Diclofenac Sodium 2 % SOLN, Place 2 g onto the skin 2 (two) times daily. Marland Kitchen  gabapentin (NEURONTIN)  300 MG capsule, Take 1 capsule (300 mg total) by mouth at bedtime. nightly .  glucose blood test strip, 1 each by Other route as needed for other. RELION PRIME TEST STRIPS  Use as instructed .  RELION LANCETS STANDARD 21G MISC, by Does not apply route. .  terbinafine (LAMISIL) 250 MG tablet, Take 1 tablet (250 mg total) by mouth daily.    Past medical history, social, surgical and family history all reviewed in electronic medical record.  No pertanent information unless stated regarding to the chief complaint.   Review of Systems:  No headache, visual changes, nausea, vomiting, diarrhea, constipation, dizziness, abdominal pain, skin rash, fevers, chills, night sweats, weight loss, swollen lymph nodes, body aches, joint swelling, muscle aches, chest pain, shortness of breath, mood changes.   Objective  There were no vitals taken for this visit. Systems examined below as of    General: No apparent distress alert and oriented x3 mood and affect normal, dressed appropriately.  HEENT: Pupils equal, extraocular movements intact  Respiratory: Patient's speak in full sentences and does not appear short of breath  Cardiovascular: No lower extremity edema, non tender, no erythema  Skin: Warm dry intact with no signs of infection or rash on extremities or on axial skeleton.  Abdomen: Soft nontender  Neuro: Cranial nerves II through XII are intact, neurovascularly intact in all extremities with 2+ DTRs and 2+ pulses.  Lymph: No lymphadenopathy of posterior or anterior cervical chain or axillae bilaterally.  Gait normal with good balance and coordination.  MSK:  Non tender with full range of motion and good stability and symmetric strength and tone of elbows, wrist, hip, knee and ankles bilaterally.  Shoulder: Bilateral Inspection reveals no abnormalities, atrophy or asymmetry. Palpation is normal with no tenderness over AC joint or bicipital groove. Patient does have some mild limitation with  external range of motion of 5 degrees bilaterally Rotator cuff strength normal throughout. Positive impingement bilaterally Speeds and Yergason's tests normal. No labral pathology noted with negative Obrien's, negative clunk and good stability. Normal scapular function observed. No painful arc and no drop arm sign. No apprehension sign     Impression and Recommendations:    . The above documentation has been reviewed and is accurate and complete Ruth Crawford       Note: This dictation was prepared with Dragon dictation along with smaller phrase technology. Any transcriptional errors that result from this process are unintentional.

## 2018-11-18 NOTE — Assessment & Plan Note (Signed)
IDoing much better.  Seems to be tolerating being conservative therapy with physical therapy significantly better.  Discussed which activities of doing which wants to avoid.  Patient is to increase activity slowly over the course of neck several days.  Follow-up with me again in 4 to 8 weeks if any worsening pain otherwise as needed.  Left shoulder x-rays ordered today.

## 2018-11-18 NOTE — Patient Instructions (Signed)
Good to see you  Ruth Crawford is your friend Stay active.  Keep hands within peripheral vision  Xray downstairs  See me when you need me [phone]

## 2018-11-19 ENCOUNTER — Encounter: Payer: Self-pay | Admitting: Rehabilitative and Restorative Service Providers"

## 2018-11-19 ENCOUNTER — Ambulatory Visit (INDEPENDENT_AMBULATORY_CARE_PROVIDER_SITE_OTHER): Payer: BLUE CROSS/BLUE SHIELD | Admitting: Rehabilitative and Restorative Service Providers"

## 2018-11-19 DIAGNOSIS — M25611 Stiffness of right shoulder, not elsewhere classified: Secondary | ICD-10-CM | POA: Diagnosis not present

## 2018-11-19 DIAGNOSIS — R293 Abnormal posture: Secondary | ICD-10-CM

## 2018-11-19 DIAGNOSIS — M6281 Muscle weakness (generalized): Secondary | ICD-10-CM

## 2018-11-19 DIAGNOSIS — R29898 Other symptoms and signs involving the musculoskeletal system: Secondary | ICD-10-CM | POA: Diagnosis not present

## 2018-11-19 DIAGNOSIS — G8929 Other chronic pain: Secondary | ICD-10-CM

## 2018-11-19 DIAGNOSIS — M25511 Pain in right shoulder: Secondary | ICD-10-CM

## 2018-11-19 NOTE — Therapy (Signed)
[facility] [place] [address] [place], [place], [address] Phone: [phone]   Fax:  [phone]  Physical Therapy Treatment  Patient Details  Name: Ruth Crawford MRN: [id number] Date of Birth: 15-Nov-1955 Referring Provider (PT): Dr Jones Broom    Encounter Date: 11/19/2018  PT End of Session - 11/19/18 1404    Visit Number  16    Number of Visits  24    Date for PT Re-Evaluation  12/17/18    PT Start Time  1400    PT Stop Time  1455    PT Time Calculation (min)  55 min    Activity Tolerance  Patient tolerated treatment well       Past Medical History:  Diagnosis Date  . Allergy   . ANXIETY   . COMMON MIGRAINE   . DEPRESSION   . DIABETES MELLITUS, TYPE II   . GERD (gastroesophageal reflux disease) 10/17/2012  . HYPERLIPIDEMIA   . HYPERTENSION   . POLYP, GALLBLADDER   . Sickle cell trait [facility])     Past Surgical History:  Procedure Laterality Date  . ABDOMINAL HYSTERECTOMY  2000  . COLONOSCOPY    . LUMBAR DISC SURGERY  1998, 2016, 2017   s/p    There were no vitals filed for this visit.  Subjective Assessment - 11/19/18 1405    Subjective  Shoulder feels achy today. Not sure why. Sees MD Monday. Pleased with progress. She is using her Rt UE for more functional activities at home and sleeping well.     Pain Score  4     Pain Location  Shoulder    Pain Orientation  Right    Pain Descriptors / Indicators  Aching    Pain Onset  More than a month ago    Pain Frequency  Intermittent    Aggravating Factors   moving the the wrong direction     Pain Relieving Factors  ice; medication          [facility] PT Assessment - 11/19/18 0001      Assessment   Medical Diagnosis  Rt RCR    Referring Provider (PT)  Dr Jones Broom     Onset Date/Surgical Date  09/01/18    Hand Dominance  Right    Next MD Visit  11/23/18    Prior Therapy  yes here - 5/19      Observation/Other Assessments   Focus on Therapeutic  Outcomes (FOTO)   36% limitation       AROM   Overall AROM Comments  Shoulder ROM assessed in standing     Right/Left Shoulder  --   no pain with AROM bilat shoulders    Right Shoulder Extension  62 Degrees    Right Shoulder Flexion  142 Degrees    Right Shoulder ABduction  141 Degrees    Right Shoulder Internal Rotation  --   T 7/8   Right Shoulder External Rotation  77 Degrees    Left Shoulder Extension  58 Degrees    Left Shoulder Flexion  144 Degrees    Left Shoulder ABduction  140 Degrees      PROM   Right Shoulder Flexion  160 Degrees    Right Shoulder ABduction  157 Degrees    Right Shoulder External Rotation  82 Degrees      Strength   Overall Strength Comments  continued improvement in AROM and resistive exercise tolerance       Palpation  Palpation comment  persistence in tightness Rt pecs; upper trap; leveator; teres; biceps                    [facility] Adult PT Treatment/Exercise - 11/19/18 0001      Shoulder Exercises: Standing   External Rotation  Strengthening;Right;10 reps;Theraband   3 sets   Theraband Level (Shoulder External Rotation)  Level 2 (Red)    Internal Rotation  Strengthening;Right;20 reps;Theraband   3 sets   Theraband Level (Shoulder Internal Rotation)  Level 2 (Red)    Flexion  Strengthening;Both;20 reps;Weights    Shoulder Flexion Weight (lbs)  1    ABduction  Strengthening;Both;20 reps;Theraband    Shoulder ABduction Weight (lbs)  1    Extension  Strengthening;Both;20 reps;Theraband    Theraband Level (Shoulder Extension)  Level 2 (Red)    Row  Strengthening;Right;Left;20 reps;Theraband    Theraband Level (Shoulder Row)  Level 2 (Red)      Shoulder Exercises: Pulleys   Flexion  --   10 sec hold x 10 reps    Scaption  --   10 sec hold x 10 reps      Shoulder Exercises: Therapy Ball   Other Therapy Ball Exercises  stabilization ball on wall elbow bent shoudler blades down and back moving ball up/down; sidie to side; circles  CW/CCW     Other Therapy Ball Exercises  press into large red ball at side working on scapular depression 5 sec hold x 10 reps x 2 sets       Vasopneumatic   Number Minutes Vasopneumatic   15 minutes    Vasopnuematic Location   Shoulder   Rt   Vasopneumatic Pressure  Low    Vasopneumatic Temperature   34 deg       Manual Therapy   Manual therapy comments  pt supine     Soft tissue mobilization  soft tissue work Rt shoulder upper trap; pecs; anterior deltoid    Passive ROM  Rt shoulder flexion, extension, scaption, ER and IR in scapular plane               PT Education - 11/19/18 1445    Education Details  HEP     Person(s) Educated  Patient    Methods  Explanation;Demonstration;Tactile cues;Verbal cues    Comprehension  Verbalized understanding;Returned demonstration;Verbal cues required;Tactile cues required       PT Short Term Goals - 11/05/18 1456      PT SHORT TERM GOAL #1   Title  Pt will be independent in inital HEP per protocol 11/02/2018    Time  6    Period  Weeks    Status  Achieved      PT SHORT TERM GOAL #2   Title  Improve posture and alignment with patient to demonstrate improve upright posture and good position of scapulae along thoracic wall 11/02/2018    Time  6    Period  Weeks    Status  Achieved      PT SHORT TERM GOAL #3   Title  PROM Rt shoulder gradually restored to [facility] 11/02/2018    Time  6    Period  Weeks    Status  Achieved        PT Long Term Goals - 11/05/18 1446      PT LONG TERM GOAL #1   Title  Increase AROM Rt shoulder to within 5-10 deg of AROM Lt shoulder 12/17/2018    Time  18    Period  Weeks    Status  Revised      PT LONG TERM GOAL #2   Title  Return to light functional tasks using Rt UE 12/17/2018    Time  18    Period  Weeks    Status  Revised      PT LONG TERM GOAL #3   Title  Pt will report minimal to no pain with ADL's (with no lifting per protocol) 12/14/2018    Time  12    Period  Weeks    Status  Achieved       PT LONG TERM GOAL #4   Title  Indpendent in HEP 12/17/2018    Time  18    Period  Weeks    Status  Revised      PT LONG TERM GOAL #5   Title  Improve FOTO to </= 48% limitation 12/14/2018    Time  12    Period  Weeks    Status  Achieved            Plan - 11/19/18 1405    Clinical Impression Statement  Continued gradual improvement with patient reporting decreased pain and increased funcitional activity level with Rt UE. She has progressed well with P/AROM and strengthening activities and demonstrates decreased palpable tightness through the Rt shoulder girdle. Excellent progress overall - progressing per post op protocol.     PT Frequency  2x / week    PT Duration  12 weeks    PT Treatment/Interventions  Patient/family education;ADLs/Self Care Home Management;Cryotherapy;Electrical Stimulation;Iontophoresis 4mg /ml Dexamethasone;Moist Heat;Ultrasound;Passive range of motion;Neuromuscular re-education;Manual techniques;Therapeutic activities;Therapeutic exercise    PT Next Visit Plan   progress per shoulder rehab protocol -  MD note    Consulted and Agree with Plan of Care  Patient       Patient will benefit from skilled therapeutic intervention in order to improve the following deficits and impairments:  Postural dysfunction, Improper body mechanics, Pain, Increased fascial restricitons, Increased muscle spasms, Decreased mobility, Decreased range of motion, Decreased strength, Decreased activity tolerance  Visit Diagnosis: Chronic right shoulder pain  Stiffness of right shoulder, not elsewhere classified  Other symptoms and signs involving the musculoskeletal system  Muscle weakness (generalized)  Abnormal posture     Problem List Patient Active Problem List   Diagnosis Date Noted  . Acute bursitis of left shoulder 10/05/2018  . Lumbar disc disease 08/26/2018  . Preop exam for internal medicine 08/26/2018  . Dizziness 07/01/2018  . Nail, ingrown 04/13/2018  .  Onychomycosis 04/13/2018  . 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/02/2018  . Right rotator cuff tear 01/05/2018  . Right cervical radiculopathy 12/12/2017  . Acute shoulder bursitis, right 12/12/2017  . Seasonal allergic rhinitis due to pollen 04/17/2017  . 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 Left-sided face pain 03/26/2017  . TMJ tenderness, left 02/05/2017  . Anemia 08/30/2016  . Parotitis, acute 03/26/2016  . Coccygeal pain, acute 02/28/2016  . Rash 10/18/2015  . Urine frequency 05/30/2015  . Lower back pain 05/30/2015  . Allergic conjunctivitis 03/02/2015  . Bursitis of right shoulder 05/09/2014  . Acute bronchitis 03/23/2014  . Seasonal allergic conjunctivitis 01/01/2014  . Food allergy 01/01/2014  . Conjunctivitis 09/12/2013  . Seasonal and perennial allergic rhinitis 09/12/2013  . Abdominal pain, other specified site 03/18/2013  . GERD (gastroesophageal reflux disease) 10/17/2012  . Encounter for well adult exam with abnormal findings 04/07/2012  . POLYP, GALLBLADDER 11/30/2010  . SHOULDER PAIN,  RIGHT 11/30/2010  . CERVICAL RADICULOPATHY, RIGHT 11/30/2010  . DEPRESSION 03/26/2010  . Headache(784.0) 03/26/2010  . SYNCOPE 06/07/2009  . Diabetes (HCC) 10/29/2007  . Hyperlipidemia 10/29/2007  . Anxiety state 10/29/2007  . COMMON MIGRAINE 10/29/2007  . Essential hypertension 10/29/2007  . BACK PAIN 10/29/2007    Ellorie Kindall Rober Minion PT, MPH  11/19/2018, 2:49 PM  [facility] [address] [place], [place], [address] Phone: [phone]   Fax:  [phone]  Name: NEOMA UHRICH MRN: [id number] Date of Birth: 1956/01/31

## 2018-11-23 ENCOUNTER — Encounter: Payer: BLUE CROSS/BLUE SHIELD | Admitting: Rehabilitative and Restorative Service Providers"

## 2018-11-23 DIAGNOSIS — Z9889 Other specified postprocedural states: Secondary | ICD-10-CM | POA: Diagnosis not present

## 2018-11-26 ENCOUNTER — Encounter: Payer: Self-pay | Admitting: Physical Therapy

## 2018-11-26 ENCOUNTER — Ambulatory Visit (INDEPENDENT_AMBULATORY_CARE_PROVIDER_SITE_OTHER): Payer: BLUE CROSS/BLUE SHIELD | Admitting: Physical Therapy

## 2018-11-26 DIAGNOSIS — R29898 Other symptoms and signs involving the musculoskeletal system: Secondary | ICD-10-CM | POA: Diagnosis not present

## 2018-11-26 DIAGNOSIS — G8929 Other chronic pain: Secondary | ICD-10-CM

## 2018-11-26 DIAGNOSIS — M25611 Stiffness of right shoulder, not elsewhere classified: Secondary | ICD-10-CM

## 2018-11-26 DIAGNOSIS — M25511 Pain in right shoulder: Secondary | ICD-10-CM | POA: Diagnosis not present

## 2018-11-26 DIAGNOSIS — M6281 Muscle weakness (generalized): Secondary | ICD-10-CM

## 2018-11-26 NOTE — Therapy (Signed)
[facility] [place] [address] [place], [place], [address] Phone: [phone]   Fax:  [phone]  Physical Therapy Treatment  Patient Details  Name: Ruth Crawford MRN: [id number] Date of Birth: Jan 17, 1956 Referring Provider (PT): Dr Jones Broom    Encounter Date: 11/26/2018  PT End of Session - 11/26/18 1557    Visit Number  17    Number of Visits  24    Date for PT Re-Evaluation  12/17/18    PT Start Time  0849    PT Stop Time  0944    PT Time Calculation (min)  55 min    Activity Tolerance  Patient tolerated treatment well    Behavior During Therapy  [facility] for tasks assessed/performed       Past Medical History:  Diagnosis Date  . Allergy   . ANXIETY   . COMMON MIGRAINE   . DEPRESSION   . DIABETES MELLITUS, TYPE II   . GERD (gastroesophageal reflux disease) 10/17/2012  . HYPERLIPIDEMIA   . HYPERTENSION   . POLYP, GALLBLADDER   . Sickle cell trait [facility]     Past Surgical History:  Procedure Laterality Date  . ABDOMINAL HYSTERECTOMY  2000  . COLONOSCOPY    . LUMBAR DISC SURGERY  1998, 2016, 2017   s/p    Subjective Assessment - 11/26/18 1559    Subjective  :  Pt visited MD, he is pleased with progress and said, "now you can start doing more weights".  "A lot of things are getting easier, even that silly door stretch I hate"    Currently in Pain?  No/denies       Treatment:   UBE L1: 1 min forward/1 min backward/ 1 min forward Pulleys:  flexion - 10 reps, 10 sec hold, abdct 10 reps, 10 hold.  Elbow flexion: 4# x 5, reduced to 3# x 10 (improved tolerance) Seated shoulder:  bilat shoulder ER with red band x 10, 2 sets Standing shoulder:  Rt shoulder IR with red band to neutral x 10, 2 sets.  Rings on arch (shoulder flexion up to 95 deg) x 5 L/R, repeated (shoulder flexion to 110 deg) x 12 L/R. repeated (shoulder flexion 120 deg) x 5 L/R.  Bilat Row with green band x 10 reps.  Ball rolling in circles on back of  hand (ball on wall) with shoulder in 45 deg scaption x 1 min CW, 1 min CCW. Stretches:  low and midlevel doorway stretch x 15 sec each, 2 reps  Modalities: vasopneumatic to Rt shoulder @34  deg, x 15 min    PT Short Term Goals - 11/05/18 1456      PT SHORT TERM GOAL #1   Title  Pt will be independent in inital HEP per protocol 11/02/2018    Time  6    Period  Weeks    Status  Achieved      PT SHORT TERM GOAL #2   Title  Improve posture and alignment with patient to demonstrate improve upright posture and good position of scapulae along thoracic wall 11/02/2018    Time  6    Period  Weeks    Status  Achieved      PT SHORT TERM GOAL #3   Title  PROM Rt shoulder gradually restored to [facility] 11/02/2018    Time  6    Period  Weeks    Status  Achieved        PT Long Term Goals -  11/05/18 1446      PT LONG TERM GOAL #1   Title  Increase AROM Rt shoulder to within 5-10 deg of AROM Lt shoulder 12/17/2018    Time  18    Period  Weeks    Status  Revised      PT LONG TERM GOAL #2   Title  Return to light functional tasks using Rt UE 12/17/2018    Time  18    Period  Weeks    Status  Revised      PT LONG TERM GOAL #3   Title  Pt will report minimal to no pain with ADL's (with no lifting per protocol) 12/14/2018    Time  12    Period  Weeks    Status  Achieved      PT LONG TERM GOAL #4   Title  Indpendent in HEP 12/17/2018    Time  18    Period  Weeks    Status  Revised      PT LONG TERM GOAL #5   Title  Improve FOTO to </= 48% limitation 12/14/2018    Time  12    Period  Weeks    Status  Achieved            Plan - 11/26/18 1559    Clinical Impression Statement  Pt tolerated 3# elbow flexion better than 4# today. She was able to increase row resistance to green band without difficulty.  All exercises performed without increase in symptoms.  Pt progressing well towards remaining goals.      Rehab Potential  Good    PT Frequency  2x / week    PT Duration  12 weeks    PT  Next Visit Plan  continue progressive exercises for Rt shoulder per protocol.     Consulted and Agree with Plan of Care  Patient       Patient will benefit from skilled therapeutic intervention in order to improve the following deficits and impairments:  Postural dysfunction, Improper body mechanics, Pain, Increased fascial restricitons, Increased muscle spasms, Decreased mobility, Decreased range of motion, Decreased strength, Decreased activity tolerance  Visit Diagnosis: Chronic right shoulder pain  Stiffness of right shoulder, not elsewhere classified  Other symptoms and signs involving the musculoskeletal system  Muscle weakness (generalized)     Problem List Patient Active Problem List   Diagnosis Date Noted  . Acute bursitis of left shoulder 10/05/2018  . Lumbar disc disease 08/26/2018  . Preop exam for internal medicine 08/26/2018  . Dizziness 07/01/2018  . Nail, ingrown 04/13/2018  . Onychomycosis 04/13/2018  . 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/02/2018  . Right rotator cuff tear 01/05/2018  . Right cervical radiculopathy 12/12/2017  . Acute shoulder bursitis, right 12/12/2017  . Seasonal allergic rhinitis due to pollen 04/17/2017  . 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 Left-sided face pain 03/26/2017  . TMJ tenderness, left 02/05/2017  . Anemia 08/30/2016  . Parotitis, acute 03/26/2016  . Coccygeal pain, acute 02/28/2016  . Rash 10/18/2015  . Urine frequency 05/30/2015  . Lower back pain 05/30/2015  . Allergic conjunctivitis 03/02/2015  . Bursitis of right shoulder 05/09/2014  . Acute bronchitis 03/23/2014  . Seasonal allergic conjunctivitis 01/01/2014  . Food allergy 01/01/2014  . Conjunctivitis 09/12/2013  . Seasonal and perennial allergic rhinitis 09/12/2013  . Abdominal pain, other specified site 03/18/2013  . GERD (gastroesophageal reflux disease) 10/17/2012  . Encounter for well adult exam with abnormal findings 04/07/2012  .  POLYP,  GALLBLADDER 11/30/2010  . SHOULDER PAIN, RIGHT 11/30/2010  . CERVICAL RADICULOPATHY, RIGHT 11/30/2010  . DEPRESSION 03/26/2010  . Headache(784.0) 03/26/2010  . SYNCOPE 06/07/2009  . Diabetes (HCC) 10/29/2007  . Hyperlipidemia 10/29/2007  . Anxiety state 10/29/2007  . COMMON MIGRAINE 10/29/2007  . Essential hypertension 10/29/2007  . BACK PAIN 10/29/2007   Mayer Camel, PTA 11/26/18 4:01 PM  [facility] [place] [address] [place], [place], [address] Phone: [phone]   Fax:  [phone]  Name: IRANY GALLUCCI MRN: [id number] Date of Birth: 06-Oct-1955

## 2018-11-30 ENCOUNTER — Ambulatory Visit (INDEPENDENT_AMBULATORY_CARE_PROVIDER_SITE_OTHER): Payer: BLUE CROSS/BLUE SHIELD | Admitting: Physical Therapy

## 2018-11-30 ENCOUNTER — Other Ambulatory Visit: Payer: Self-pay

## 2018-11-30 DIAGNOSIS — R29898 Other symptoms and signs involving the musculoskeletal system: Secondary | ICD-10-CM

## 2018-11-30 DIAGNOSIS — M25511 Pain in right shoulder: Secondary | ICD-10-CM | POA: Diagnosis not present

## 2018-11-30 DIAGNOSIS — G8929 Other chronic pain: Secondary | ICD-10-CM | POA: Diagnosis not present

## 2018-11-30 DIAGNOSIS — M25611 Stiffness of right shoulder, not elsewhere classified: Secondary | ICD-10-CM

## 2018-11-30 NOTE — Therapy (Addendum)
[place] [address], [place], [address] Phone: [phone]   Fax:  [phone]  Physical Therapy Treatment  Patient Details  Name: Ruth Crawford MRN: [id number] Date of Birth: 1956-04-24 Referring Provider (PT): Dr Tania Ade    Encounter Date: 11/30/2018  PT End of Session - 11/30/18 0844    Visit Number  18    Number of Visits  24    Date for PT Re-Evaluation  12/17/18    PT Start Time  0803    PT Stop Time  0858    PT Time Calculation (min)  55 min    Activity Tolerance  Patient tolerated treatment well    Behavior During Therapy  [facility] for tasks assessed/performed       Past Medical History:  Diagnosis Date  . Allergy   . ANXIETY   . COMMON MIGRAINE   . DEPRESSION   . DIABETES MELLITUS, TYPE II   . GERD (gastroesophageal reflux disease) 10/17/2012  . HYPERLIPIDEMIA   . HYPERTENSION   . POLYP, GALLBLADDER   . Sickle cell trait [facility])     Past Surgical History:  Procedure Laterality Date  . ABDOMINAL HYSTERECTOMY  2000  . COLONOSCOPY    . [place], 2016, 2017   s/p    There were no vitals filed for this visit.  Subjective Assessment - 11/30/18 0845    Subjective  Pt reports no new changes since last visit.     Currently in Pain?  No/denies    Pain Score  0-No pain         [facility] PT Assessment - 11/30/18 0001      Assessment   Medical Diagnosis  Rt RCR    Referring Provider (PT)  Dr Tania Ade     Onset Date/Surgical Date  09/01/18    Hand Dominance  Right    Next MD Visit  11/23/18    Prior Therapy  yes here - 5/19       [facility] Adult PT Treatment/Exercise - 11/30/18 0001      Elbow Exercises   Elbow Flexion  Strengthening;Both;15 reps    Bar Weights/Barbell (Elbow Flexion)  3 lbs      Shoulder Exercises: Standing   Flexion  Strengthening;Both;Weights;10 reps   2 sets   Shoulder Flexion Weight (lbs)  2, 1    ABduction  Strengthening;Right;10  reps;Weights   2 sets   Shoulder ABduction Weight (lbs)  2, 1    Other Standing Exercises  Rt shoulder flexion: reaching 1# wt to overhead shelf x 10, eccentric control back to counter.     Other Standing Exercises  Ball rolling in circles on back of hand (ball on wall) with shoulder in 45 deg scaption x 1 min CW, 1 min CCW      Shoulder Exercises: Pulleys   Flexion  --   8 sec hold x 5 reps    Scaption  --   8 sec hold x 5 reps      Shoulder Exercises: ROM/Strengthening   UBE (Upper Arm Bike)  L2: 1 min forward, 1 min backward, 1.5 min forward.     Pushups Limitations  reverse wall push ups with 3 sec pause in retraction, x 10 reps     Other ROM/Strengthening Exercises  bent over row with 2# each hand x 10, repeated with 3# x 10      Shoulder Exercises: Stretch   Other Shoulder Stretches  3 position doorway stretch - modified to unilateral for improved comfort, held 15 sec x 2 reps each position with RUE.     Other Shoulder Stretches  shoulder ext dynamic stretch holding cane behind back x 10       Vasopneumatic   Number Minutes Vasopneumatic   15 minutes    Vasopnuematic Location   Shoulder   Rt   Vasopneumatic Pressure  Low    Vasopneumatic Temperature   34 deg                PT Short Term Goals - 11/05/18 1456      PT SHORT TERM GOAL #1   Title  Pt will be independent in inital HEP per protocol 11/02/2018    Time  6    Period  Weeks    Status  Achieved      PT SHORT TERM GOAL #2   Title  Improve posture and alignment with patient to demonstrate improve upright posture and good position of scapulae along thoracic wall 11/02/2018    Time  6    Period  Weeks    Status  Achieved      PT SHORT TERM GOAL #3   Title  PROM Rt shoulder gradually restored to [facility] 11/02/2018    Time  6    Period  Weeks    Status  Achieved        PT Long Term Goals - 11/05/18 1446      PT LONG TERM GOAL #1   Title  Increase AROM Rt shoulder to within 5-10 deg of AROM Lt shoulder  12/17/2018    Time  18    Period  Weeks    Status  Revised      PT LONG TERM GOAL #2   Title  Return to light functional tasks using Rt UE 12/17/2018    Time  18    Period  Weeks    Status  Revised      PT LONG TERM GOAL #3   Title  Pt will report minimal to no pain with ADL's (with no lifting per protocol) 12/14/2018    Time  12    Period  Weeks    Status  Achieved      PT LONG TERM GOAL #4   Title  Indpendent in HEP 12/17/2018    Time  18    Period  Weeks    Status  Revised      PT LONG TERM GOAL #5   Title  Improve FOTO to </= 48% limitation 12/14/2018    Time  12    Period  Weeks    Status  Achieved            Plan - 11/30/18 0825    Clinical Impression Statement  Pt reported some increase in low back pain with standing shoulder scap with 2# and doorway stretch; modified to 1 # with improved tolerance.  Otherwise, all other exercises tolerated well.  Pt progressing gradually towards remaining goals.     Rehab Potential  Good    PT Frequency  2x / week    PT Duration  12 weeks    PT Treatment/Interventions  Patient/family education;ADLs/Self Care Home Management;Cryotherapy;Electrical Stimulation;Iontophoresis 78m/ml Dexamethasone;Moist Heat;Ultrasound;Passive range of motion;Neuromuscular re-education;Manual techniques;Therapeutic activities;Therapeutic exercise    PT Next Visit Plan  continue progressive exercises for Rt shoulder per protocol.     Consulted and Agree with Plan of Care  Patient  Patient will benefit from skilled therapeutic intervention in order to improve the following deficits and impairments:  Postural dysfunction, Improper body mechanics, Pain, Increased fascial restricitons, Increased muscle spasms, Decreased mobility, Decreased range of motion, Decreased strength, Decreased activity tolerance  Visit Diagnosis: Chronic right shoulder pain  Stiffness of right shoulder, not elsewhere classified  Other symptoms and signs involving the  musculoskeletal system     Problem List Patient Active Problem List   Diagnosis Date Noted  . Acute bursitis of left shoulder 10/05/2018  . Lumbar disc disease 08/26/2018  . Preop exam for internal medicine 08/26/2018  . Dizziness 07/01/2018  . Nail, ingrown 04/13/2018  . Onychomycosis 04/13/2018  . 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/02/2018  . Right rotator cuff tear 01/05/2018  . Right cervical radiculopathy 12/12/2017  . Acute shoulder bursitis, right 12/12/2017  . Seasonal allergic rhinitis due to pollen 04/17/2017  . 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 Left-sided face pain 03/26/2017  . TMJ tenderness, left 02/05/2017  . Anemia 08/30/2016  . Parotitis, acute 03/26/2016  . Coccygeal pain, acute 02/28/2016  . Rash 10/18/2015  . Urine frequency 05/30/2015  . Lower back pain 05/30/2015  . Allergic conjunctivitis 03/02/2015  . Bursitis of right shoulder 05/09/2014  . Acute bronchitis 03/23/2014  . Seasonal allergic conjunctivitis 01/01/2014  . Food allergy 01/01/2014  . Conjunctivitis 09/12/2013  . Seasonal and perennial allergic rhinitis 09/12/2013  . Abdominal pain, other specified site 03/18/2013  . GERD (gastroesophageal reflux disease) 10/17/2012  . Encounter for well adult exam with abnormal findings 04/07/2012  . POLYP, GALLBLADDER 11/30/2010  . SHOULDER PAIN, RIGHT 11/30/2010  . CERVICAL RADICULOPATHY, RIGHT 11/30/2010  . DEPRESSION 03/26/2010  . Headache(784.0) 03/26/2010  . SYNCOPE 06/07/2009  . Diabetes ([place]) 10/29/2007  . Hyperlipidemia 10/29/2007  . Anxiety state 10/29/2007  . COMMON MIGRAINE 10/29/2007  . Essential hypertension 10/29/2007  . BACK PAIN 10/29/2007   Kerin Perna, PTA 11/30/18 8:47 AM  [facility] [place], [place], [address] Phone: [phone]   Fax:  [phone]  Name: Ruth Crawford MRN: [id number] Date of Birth: March 07, 1956  PHYSICAL  THERAPY DISCHARGE SUMMARY  Visits from Start of Care: 18  Current functional level related to goals / functional outcomes: See last progress note for discharge status   Remaining deficits: Unknown - needs to continue with consistent HEP    Education / Equipment: HEP   Plan: Patient agrees to discharge.  Patient goals were met. Patient is being discharged due to meeting the stated rehab goals.  ?????      Celyn P. Helene Kelp PT, MPH 02/01/19 11:36 AM

## 2018-12-03 ENCOUNTER — Encounter: Payer: BLUE CROSS/BLUE SHIELD | Admitting: Rehabilitative and Restorative Service Providers"

## 2018-12-04 ENCOUNTER — Telehealth: Payer: Self-pay | Admitting: Physical Therapy

## 2018-12-04 NOTE — Telephone Encounter (Signed)
Called patient to inform her that [facility] are closed until 12/21/18 due to COVID19.  There was no answer, so I left a message on her confidential voice mail that her appt on 12/07/18 has been cancelled and to call us back to reschedule.    Mayer Camel, PTA 12/04/18 12:35 PM

## 2018-12-07 ENCOUNTER — Encounter: Payer: BLUE CROSS/BLUE SHIELD | Admitting: Physical Therapy

## 2018-12-22 ENCOUNTER — Telehealth: Payer: Self-pay | Admitting: Rehabilitative and Restorative Service Providers"

## 2018-12-22 NOTE — Telephone Encounter (Signed)
TC to patient to discuss progress with shoulder rehab. She states that she is doing well but missing therapy. She has not been as consistent with her HEP in the past week or so. Encouraged patient to resume her exercises consistently. PT will call to follow up with her in ~ 2 weeks. Anticipate d/c to independent HEP at that time.   Val Riles, PT, MPH  Acute Rehab [phone]

## 2018-12-23 ENCOUNTER — Other Ambulatory Visit: Payer: Self-pay | Admitting: Family Medicine

## 2019-01-04 DIAGNOSIS — Z09 Encounter for follow-up examination after completed treatment for conditions other than malignant neoplasm: Secondary | ICD-10-CM | POA: Diagnosis not present

## 2019-01-14 ENCOUNTER — Other Ambulatory Visit: Payer: Self-pay | Admitting: Internal Medicine

## 2019-01-28 ENCOUNTER — Other Ambulatory Visit: Payer: Self-pay | Admitting: Family Medicine

## 2019-01-28 NOTE — Telephone Encounter (Signed)
Refill done.  

## 2019-02-23 ENCOUNTER — Telehealth: Payer: Self-pay | Admitting: Internal Medicine

## 2019-02-23 NOTE — Telephone Encounter (Signed)
Informed pt .

## 2019-02-23 NOTE — Telephone Encounter (Signed)
Patient has appt this Friday 6/12. I have changed this appt to a doxy due to patient stating she believes she had a cold but is towards the end of it.  Patient states she does not have fever or chills.  Patient would prefer to come into the office.  Please advise.

## 2019-02-25 ENCOUNTER — Ambulatory Visit: Payer: BLUE CROSS/BLUE SHIELD | Admitting: Internal Medicine

## 2019-02-26 ENCOUNTER — Other Ambulatory Visit: Payer: Self-pay

## 2019-02-26 ENCOUNTER — Ambulatory Visit (INDEPENDENT_AMBULATORY_CARE_PROVIDER_SITE_OTHER): Payer: BC Managed Care – PPO | Admitting: Internal Medicine

## 2019-02-26 DIAGNOSIS — I10 Essential (primary) hypertension: Secondary | ICD-10-CM | POA: Diagnosis not present

## 2019-02-26 DIAGNOSIS — E785 Hyperlipidemia, unspecified: Secondary | ICD-10-CM | POA: Diagnosis not present

## 2019-02-26 DIAGNOSIS — E611 Iron deficiency: Secondary | ICD-10-CM

## 2019-02-26 DIAGNOSIS — Z0000 Encounter for general adult medical examination without abnormal findings: Secondary | ICD-10-CM

## 2019-02-26 DIAGNOSIS — E119 Type 2 diabetes mellitus without complications: Secondary | ICD-10-CM | POA: Diagnosis not present

## 2019-02-26 DIAGNOSIS — E538 Deficiency of other specified B group vitamins: Secondary | ICD-10-CM | POA: Diagnosis not present

## 2019-02-26 DIAGNOSIS — E559 Vitamin D deficiency, unspecified: Secondary | ICD-10-CM

## 2019-02-26 NOTE — Assessment & Plan Note (Signed)
stable overall by history and exam, recent data reviewed with pt, and pt to continue medical treatment as before,  to f/u any worsening symptoms or concerns, to cont to monitor BP at home for < 140/90 goal

## 2019-02-26 NOTE — Assessment & Plan Note (Signed)
stable overall by history and exam, recent data reviewed with pt, and pt to continue medical treatment as before,  to f/u any worsening symptoms or concerns, for lipids with labs 

## 2019-02-26 NOTE — Patient Instructions (Signed)

## 2019-02-26 NOTE — Assessment & Plan Note (Signed)
stable overall by history and exam, recent data reviewed with pt, and pt to continue medical treatment as before,  to f/u any worsening symptoms or concerns, for a1c with labs, work on diet and activity

## 2019-02-26 NOTE — Progress Notes (Signed)
Patient ID: Ruth PlaterSandy L Crawford, female   DOB: 06/17/1956, 63 y.o.   MRN: [id number]  Virtual Visit via Video Note  I connected with Ruth Crawford on 02/26/19 at  9:40 AM EDT by a video enabled telemedicine application and verified that I am speaking with the correct person using two identifiers.  Location: Patient: at home Provider: at office   I discussed the limitations of evaluation and management by telemedicine and the availability of in person appointments. The patient expressed understanding and agreed to proceed.  History of Present Illness: Here to f/u; overall doing ok,  Pt denies chest pain, increasing sob or doe, wheezing, orthopnea, PND, increased LE swelling, palpitations, dizziness or syncope.  Pt denies new neurological symptoms such as new headache, or facial or extremity weakness or numbness.  Pt denies polydipsia, polyuria, or low sugar episode.  Pt states overall good compliance with meds, mostly trying to follow appropriate diet, with wt overall stable,  but little exercise however, less during the pandemic but plans to do more soon.  Has some dry mouth for unclear reasons, and does have some snoring at night but no daytime hypersomnolence.  Has had increased stress and now has temp custody of an 8 mo grandchild.   Past Medical History:  Diagnosis Date  . Allergy   . ANXIETY   . COMMON MIGRAINE   . DEPRESSION   . DIABETES MELLITUS, TYPE II   . GERD (gastroesophageal reflux disease) 10/17/2012  . HYPERLIPIDEMIA   . HYPERTENSION   . POLYP, GALLBLADDER   . Sickle cell trait [facility](HCC)    Past Surgical History:  Procedure Laterality Date  . ABDOMINAL HYSTERECTOMY  2000  . COLONOSCOPY    . LUMBAR DISC SURGERY  1998, 2016, 2017   s/p    reports that she has never smoked. She has never used smokeless tobacco. She reports current alcohol use. She reports that she does not use drugs. family history includes Alcohol abuse in an other family member; Arthritis in an other family member;  Diabetes in her mother; Heart disease in her father; Stroke in an other family member. Allergies  Allergen Reactions  . Demerol [Meperidine] Other (See Comments)    Per pt: unknown  . Prednisone Itching and Rash   Current Outpatient Medications on File Prior to Visit  Medication Sig Dispense Refill  . Aspirin-Acetaminophen-Caffeine (EXCEDRIN MIGRAINE PO) Take by mouth as needed.    Marland Kitchen. azelastine (OPTIVAR) 0.05 % ophthalmic solution Place 1 drop into both eyes 2 (two) times daily. 6 mL 12  . Diclofenac Sodium 2 % SOLN Place 2 g onto the skin 2 (two) times daily. 112 g 3  . diphenhydrAMINE (BENADRYL) 25 MG tablet Take 25 mg by mouth daily as needed.    . Exenatide ER (BYDUREON) 2 MG PEN Inject 2 mg into the skin once a week. 12 each 3  . gabapentin (NEURONTIN) 300 MG capsule Take 1 capsule by mouth at bedtime 90 capsule 0  . glipiZIDE (GLIPIZIDE XL) 5 MG 24 hr tablet Take 1 tablet (5 mg total) by mouth 2 (two) times daily. 180 tablet 1  . glucose blood test strip 1 each by Other route as needed for other. RELION PRIME TEST STRIPS  Use as instructed    . lisinopril (PRINIVIL,ZESTRIL) 20 MG tablet Take 1 tablet (20 mg total) by mouth daily. 90 tablet 3  . lovastatin (MEVACOR) 40 MG tablet TAKE 1 TABLET BY MOUTH ONCE DAILY 90 tablet 3  . metFORMIN (GLUCOPHAGE)  1000 MG tablet TAKE 1 TABLET BY MOUTH TWICE DAILY WITH MEALS 180 tablet 0  . phenylephrine (SUDAFED PE) 10 MG TABS tablet Take 10 mg by mouth daily.    Marland Kitchen RELION LANCETS STANDARD 21G MISC by Does not apply route.    . terbinafine (LAMISIL) 250 MG tablet Take 1 tablet (250 mg total) by mouth daily. 30 tablet 2   No current facility-administered medications on file prior to visit.     Observations/Objective: Alert, NAD, appropriate mood and affect, resps normal, cn 2-12 intact, moves all 4s, no visible rash or swelling Lab Results  Component Value Date   WBC 8.4 08/26/2018   HGB 11.9 (L) 08/26/2018   HCT 35.9 (L) 08/26/2018   PLT 437.0  (H) 08/26/2018   GLUCOSE 251 (H) 08/26/2018   CHOL 122 08/26/2018   TRIG 125.0 08/26/2018   HDL 60.30 08/26/2018   LDLCALC 36 08/26/2018   ALT 9 08/26/2018   AST 13 08/26/2018   NA 139 08/26/2018   K 3.9 08/26/2018   CL 103 08/26/2018   CREATININE 0.61 08/26/2018   BUN 14 08/26/2018   CO2 28 08/26/2018   TSH 0.95 08/26/2018   INR 1.0 08/26/2018   HGBA1C 8.3 (H) 07/01/2018   MICROALBUR 1.1 08/26/2018   Assessment and Plan: See notes  Follow Up Instructions: See notes   I discussed the assessment and treatment plan with the patient. The patient was provided an opportunity to ask questions and all were answered. The patient agreed with the plan and demonstrated an understanding of the instructions.   The patient was advised to call back or seek an in-person evaluation if the symptoms worsen or if the condition fails to improve as anticipated.   Cathlean Cower, MD

## 2019-04-19 ENCOUNTER — Other Ambulatory Visit (INDEPENDENT_AMBULATORY_CARE_PROVIDER_SITE_OTHER): Payer: BC Managed Care – PPO

## 2019-04-19 DIAGNOSIS — E119 Type 2 diabetes mellitus without complications: Secondary | ICD-10-CM

## 2019-04-19 DIAGNOSIS — E559 Vitamin D deficiency, unspecified: Secondary | ICD-10-CM

## 2019-04-19 DIAGNOSIS — E538 Deficiency of other specified B group vitamins: Secondary | ICD-10-CM | POA: Diagnosis not present

## 2019-04-19 DIAGNOSIS — E611 Iron deficiency: Secondary | ICD-10-CM

## 2019-04-19 LAB — HEPATIC FUNCTION PANEL
ALT: 8 U/L (ref 0–35)
AST: 10 U/L (ref 0–37)
Albumin: 4.2 g/dL (ref 3.5–5.2)
Alkaline Phosphatase: 66 U/L (ref 39–117)
Bilirubin, Direct: 0.1 mg/dL (ref 0.0–0.3)
Total Bilirubin: 0.3 mg/dL (ref 0.2–1.2)
Total Protein: 6.9 g/dL (ref 6.0–8.3)

## 2019-04-19 LAB — BASIC METABOLIC PANEL
BUN: 17 mg/dL (ref 6–23)
CO2: 26 mEq/L (ref 19–32)
Calcium: 9.3 mg/dL (ref 8.4–10.5)
Chloride: 105 mEq/L (ref 96–112)
Creatinine, Ser: 0.68 mg/dL (ref 0.40–1.20)
GFR: 105.69 mL/min (ref >60.00)
Glucose, Bld: 257 mg/dL — ABNORMAL HIGH (ref 70–99)
Potassium: 4 mEq/L (ref 3.5–5.1)
Sodium: 140 mEq/L (ref 135–145)

## 2019-04-19 LAB — VITAMIN D 25 HYDROXY (VIT D DEFICIENCY, FRACTURES): VITD: 43.7 ng/mL (ref 30.00–100.00)

## 2019-04-19 LAB — HEMOGLOBIN A1C: Hgb A1c MFr Bld: 7.9 % — ABNORMAL HIGH (ref 4.6–6.5)

## 2019-04-19 LAB — IBC PANEL
Iron: 29 ug/dL — ABNORMAL LOW (ref 42–145)
Saturation Ratios: 7 % — ABNORMAL LOW (ref 20.0–50.0)
Transferrin: 295 mg/dL (ref 212.0–360.0)

## 2019-04-19 LAB — VITAMIN B12: Vitamin B-12: 108 pg/mL — ABNORMAL LOW (ref 211–911)

## 2019-04-19 LAB — LIPID PANEL
Cholesterol: 126 mg/dL (ref 0–200)
HDL: 56 mg/dL (ref >39.00)
LDL Cholesterol: 46 mg/dL (ref 0–99)
NonHDL: 70
Total CHOL/HDL Ratio: 2
Triglycerides: 120 mg/dL (ref 0.0–149.0)
VLDL: 24 mg/dL (ref 0.0–40.0)

## 2019-04-20 ENCOUNTER — Other Ambulatory Visit: Payer: Self-pay | Admitting: Internal Medicine

## 2019-04-20 DIAGNOSIS — D509 Iron deficiency anemia, unspecified: Secondary | ICD-10-CM

## 2019-04-20 MED ORDER — GLIPIZIDE ER 10 MG PO TB24: 10.0000 mg | ORAL_TABLET | Freq: Every day | ORAL | 3 refills | Status: DC

## 2019-04-20 MED ORDER — POLYSACCHARIDE IRON COMPLEX 150 MG PO CAPS: 150.0000 mg | ORAL_CAPSULE | Freq: Every day | ORAL | 1 refills | Status: DC

## 2019-04-21 ENCOUNTER — Other Ambulatory Visit: Payer: Self-pay | Admitting: Internal Medicine

## 2019-04-21 ENCOUNTER — Other Ambulatory Visit: Payer: Self-pay | Admitting: Family Medicine

## 2019-04-26 ENCOUNTER — Encounter: Payer: Self-pay | Admitting: Internal Medicine

## 2019-04-26 DIAGNOSIS — K649 Unspecified hemorrhoids: Secondary | ICD-10-CM

## 2019-04-26 MED ORDER — HYDROCORTISONE ACETATE 25 MG RE SUPP: 25.0000 mg | Freq: Two times a day (BID) | RECTAL | 1 refills | Status: AC

## 2019-04-26 NOTE — Telephone Encounter (Signed)
pccs to see pt request

## 2019-04-28 ENCOUNTER — Ambulatory Visit (INDEPENDENT_AMBULATORY_CARE_PROVIDER_SITE_OTHER): Payer: BC Managed Care – PPO

## 2019-04-28 ENCOUNTER — Other Ambulatory Visit: Payer: Self-pay

## 2019-04-28 DIAGNOSIS — E538 Deficiency of other specified B group vitamins: Secondary | ICD-10-CM | POA: Diagnosis not present

## 2019-04-28 MED ORDER — CYANOCOBALAMIN 1000 MCG/ML IJ SOLN
1000.0000 ug | Freq: Once | INTRAMUSCULAR | Status: AC
  Administered 2019-04-28: 1000 ug via INTRAMUSCULAR

## 2019-04-28 NOTE — Progress Notes (Signed)
Medical screening examination/treatment/procedure(s) were performed by non-physician practitioner and as supervising physician I was immediately available for consultation/collaboration. I agree with above. Rakhi Romagnoli, MD   

## 2019-04-29 ENCOUNTER — Encounter: Payer: Self-pay | Admitting: Internal Medicine

## 2019-04-29 MED ORDER — HYDROCORTISONE (PERIANAL) 2.5 % EX CREA: 1.0000 "application " | TOPICAL_CREAM | Freq: Two times a day (BID) | CUTANEOUS | 0 refills | Status: DC

## 2019-05-31 ENCOUNTER — Other Ambulatory Visit (INDEPENDENT_AMBULATORY_CARE_PROVIDER_SITE_OTHER): Payer: BC Managed Care – PPO

## 2019-05-31 ENCOUNTER — Ambulatory Visit (INDEPENDENT_AMBULATORY_CARE_PROVIDER_SITE_OTHER): Payer: BC Managed Care – PPO | Admitting: Internal Medicine

## 2019-05-31 ENCOUNTER — Encounter: Payer: Self-pay | Admitting: Internal Medicine

## 2019-05-31 VITALS — BP 140/84 | HR 92 | Temp 98.7°F | Ht 60.25 in | Wt 119.2 lb

## 2019-05-31 DIAGNOSIS — R140 Abdominal distension (gaseous): Secondary | ICD-10-CM

## 2019-05-31 DIAGNOSIS — D509 Iron deficiency anemia, unspecified: Secondary | ICD-10-CM

## 2019-05-31 LAB — CBC WITH DIFFERENTIAL/PLATELET
Basophils Absolute: 0.1 10*3/uL (ref 0.0–0.1)
Basophils Relative: 1.3 % (ref 0.0–3.0)
Eosinophils Absolute: 0.3 10*3/uL (ref 0.0–0.7)
Eosinophils Relative: 3.2 % (ref 0.0–5.0)
HCT: 35.2 % — ABNORMAL LOW (ref 36.0–46.0)
Hemoglobin: 11.4 g/dL — ABNORMAL LOW (ref 12.0–15.0)
Lymphocytes Relative: 25.3 % (ref 12.0–46.0)
Lymphs Abs: 2.1 10*3/uL (ref 0.7–4.0)
MCHC: 32.3 g/dL (ref 30.0–36.0)
MCV: 80 fl (ref 78.0–100.0)
Monocytes Absolute: 0.4 10*3/uL (ref 0.1–1.0)
Monocytes Relative: 5 % (ref 3.0–12.0)
Neutro Abs: 5.5 10*3/uL (ref 1.4–7.7)
Neutrophils Relative %: 65.2 % (ref 43.0–77.0)
Platelets: 421 10*3/uL — ABNORMAL HIGH (ref 150.0–400.0)
RBC: 4.4 Mil/uL (ref 3.87–5.11)
RDW: 15.2 % (ref 11.5–15.5)
WBC: 8.5 10*3/uL (ref 4.0–10.5)

## 2019-05-31 LAB — FERRITIN: Ferritin: 14.2 ng/mL (ref 10.0–291.0)

## 2019-05-31 LAB — IGA: IgA: 188 mg/dL (ref 68–378)

## 2019-05-31 NOTE — Progress Notes (Signed)
HISTORY OF PRESENT ILLNESS:  Ruth Crawford is a 63 y.o. female, caretaker of her infant grandchild-boy, with past medical history as listed below including sickle cell trait and diabetes who is sent today by her primary care provider Dr. Jonny RuizJohn regarding iron deficiency anemia and the need for GI assessment.  The patient underwent routine screening colonoscopy in 2007 which was negative for neoplasia.  Review of blood work from December 2017 shows mild anemia with hemoglobin 11.7.  Iron saturation at that time was low at 12.1%.  She did subsequently undergo complete colonoscopy September 30, 2016.  Examination revealed right-sided diverticulosis and internal hemorrhoids.  Otherwise normal exam.  No neoplasia.  Follow-up in 10 years recommended.  Patient's last hemoglobin August 26, 2018 was 11.9.  April 19, 2019 she underwent laboratories including iron studies which revealed low saturation of 7%.  Total iron 29.  She is also B12 deficient with a level of 108.  She is currently on B12 and iron replacement therapies.  Patient is not a blood donor.  She denies melena or hematochezia.  Her GI complaints include bloating after eating certain items such as bread.  She has had no change in her weight in the past year.  GI review of systems is otherwise remarkable for decreased appetite.  Review of relevant x-ray file shows abdominal ultrasound from 2012 with probable diminutive gallbladder polyp.  No other abnormalities.  She is on oral therapy for her diabetes.  Last hemoglobin A1c 7.9.  REVIEW OF SYSTEMS:  All non-GI ROS negative unless otherwise stated in the HPI except for ice allergy, anxiety, visual change  Past Medical History:  Diagnosis Date  . Allergy   . ANXIETY   . COMMON MIGRAINE   . DEPRESSION   . DIABETES MELLITUS, TYPE II   . GERD (gastroesophageal reflux disease) 10/17/2012  . HYPERLIPIDEMIA   . HYPERTENSION   . IDA (iron deficiency anemia)   . POLYP, GALLBLADDER   . RLS (restless legs  syndrome)   . Sickle cell trait [facility](HCC)     Past Surgical History:  Procedure Laterality Date  . ABDOMINAL HYSTERECTOMY  2000  . COLONOSCOPY    . LUMBAR DISC SURGERY  1998, 2016, 2017   s/p    Social History Ruth PlaterSandy L Crawford  reports that she has never smoked. She has never used smokeless tobacco. She reports current alcohol use. She reports that she does not use drugs.  family history includes Alcohol abuse in her mother; Arthritis in her mother; Diabetes in her brother and mother; Heart attack in her father; Heart disease in her father; Liver cancer in her mother; Stroke in her father.  Allergies  Allergen Reactions  . Demerol [Meperidine] Other (See Comments)    Per pt: unknown  . Prednisone Itching and Rash       PHYSICAL EXAMINATION: Vital signs: BP 140/84 (BP Location: Left Arm, Patient Position: Sitting, Cuff Size: Normal)   Pulse 92   Temp 98.7 F (37.1 C)   Ht 5' 0.25" (1.53 m) Comment: height measured without shoes  Wt 119 lb 4 oz (54.1 kg)   BMI 23.10 kg/m   Constitutional: generally well-appearing, no acute distress Psychiatric: alert and oriented x3, cooperative Eyes: extraocular movements intact, anicteric, conjunctiva pink Mouth: oral pharynx moist, no lesions Neck: supple no lymphadenopathy Cardiovascular: heart regular rate and rhythm, no murmur Lungs: clear to auscultation bilaterally Abdomen: soft, nontender, nondistended, no obvious ascites, no peritoneal signs, normal bowel sounds, no organomegaly Rectal: Omitted Extremities: no  clubbing, cyanosis, or lower extremity edema bilaterally Skin: no lesions on visible extremities Neuro: No focal deficits.  Cranial nerves intact  ASSESSMENT:  1.  Anemia with iron and B12 deficiencies.  Blood counts have not changed much in recent years.  Now on replacement therapies 2.  Colonoscopy 2007 and 2018- for neoplasia. 3.  Bloating with items such as bread  PLAN:  1.  Blood work today including screening test  for celiac disease, CBC, and ferritin 2.  Schedule upper endoscopy to rule out mucosal abnormalities which may lead to chronic blood loss and iron deficiency.  The patient is higher than baseline risk due to her diabetes and the need to adjust her diabetic medications..The nature of the procedure, as well as the risks, benefits, and alternatives were carefully and thoroughly reviewed with the patient. Ample time for discussion and questions allowed. The patient understood, was satisfied, and agreed to proceed. 3.  Hold diabetic medications the day of the procedure to avoid unwanted hypoglycemia 4.  Continue iron and B12 replacement therapies under the direction and guidance of Dr. Jenny Reichmann 40 minutes spent face-to-face with the patient.  Greater than 50% of the time used for counseling regarding her iron deficiency, its assessment, and issues with bloating.  We discussed celiac disease.

## 2019-05-31 NOTE — Patient Instructions (Signed)
Your provider has requested that you go to the basement level for lab work before leaving today. Press "B" on the elevator. The lab is located at the first door on the left as you exit the elevator.  You have been scheduled for an endoscopy. Please follow written instructions given to you at your visit today. If you use inhalers (even only as needed), please bring them with you on the day of your procedure.   

## 2019-06-01 LAB — TISSUE TRANSGLUTAMINASE, IGA: (tTG) Ab, IgA: 1 U/mL

## 2019-06-07 ENCOUNTER — Other Ambulatory Visit: Payer: Self-pay

## 2019-06-07 ENCOUNTER — Ambulatory Visit (INDEPENDENT_AMBULATORY_CARE_PROVIDER_SITE_OTHER): Payer: BC Managed Care – PPO

## 2019-06-07 DIAGNOSIS — E538 Deficiency of other specified B group vitamins: Secondary | ICD-10-CM | POA: Diagnosis not present

## 2019-06-07 DIAGNOSIS — Z23 Encounter for immunization: Secondary | ICD-10-CM

## 2019-06-07 MED ORDER — CYANOCOBALAMIN 1000 MCG/ML IJ SOLN
1000.0000 ug | Freq: Once | INTRAMUSCULAR | Status: AC
  Administered 2019-06-07: 15:00:00 1000 ug via INTRAMUSCULAR

## 2019-06-07 NOTE — Progress Notes (Signed)
Medical screening examination/treatment/procedure(s) were performed by non-physician practitioner and as supervising physician I was immediately available for consultation/collaboration. I agree with above. Quintez Maselli, MD   

## 2019-06-08 ENCOUNTER — Encounter: Payer: Self-pay | Admitting: Internal Medicine

## 2019-06-09 ENCOUNTER — Telehealth: Payer: Self-pay

## 2019-06-09 NOTE — Telephone Encounter (Signed)
Covid-19 screening questions   Do you now or have you had a fever in the last 14 days?  Do you have any respiratory symptoms of shortness of breath or cough now or in the last 14 days?  Do you have any family members or close contacts with diagnosed or suspected Covid-19 in the past 14 days?  Have you been tested for Covid-19 and found to be positive?       

## 2019-06-10 ENCOUNTER — Ambulatory Visit (AMBULATORY_SURGERY_CENTER): Payer: BC Managed Care – PPO | Admitting: Internal Medicine

## 2019-06-10 ENCOUNTER — Encounter: Payer: Self-pay | Admitting: Internal Medicine

## 2019-06-10 ENCOUNTER — Other Ambulatory Visit: Payer: Self-pay

## 2019-06-10 VITALS — BP 147/87 | HR 87 | Temp 98.4°F | Resp 15 | Ht 60.0 in | Wt 119.0 lb

## 2019-06-10 DIAGNOSIS — K253 Acute gastric ulcer without hemorrhage or perforation: Secondary | ICD-10-CM | POA: Diagnosis not present

## 2019-06-10 DIAGNOSIS — D509 Iron deficiency anemia, unspecified: Secondary | ICD-10-CM

## 2019-06-10 DIAGNOSIS — R140 Abdominal distension (gaseous): Secondary | ICD-10-CM

## 2019-06-10 MED ORDER — OMEPRAZOLE 40 MG PO CPDR: 40.0000 mg | DELAYED_RELEASE_CAPSULE | Freq: Every day | ORAL | 11 refills | Status: DC

## 2019-06-10 MED ORDER — SODIUM CHLORIDE 0.9 % IV SOLN: 500.0000 mL | Freq: Once | INTRAVENOUS | Status: DC

## 2019-06-10 NOTE — Op Note (Signed)
[facility] Patient Name: Ruth Crawford Procedure Date: 06/10/2019 3:51 PM MRN: [id number] Endoscopist: Wilhemina BonitoJohn N. Marina GoodellPerry , MD Age: [age] Referring MD:  Date of Birth: 01/18/1956 Gender: Female Account #: [id number] Procedure:                Upper GI endoscopy with biopsies Indications:              Iron deficiency anemia Medicines:                Monitored Anesthesia Care Procedure:                Pre-Anesthesia Assessment:                           - Prior to the procedure, a History and Physical                            was performed, and patient medications and                            allergies were reviewed. The patient's tolerance of                            previous anesthesia was also reviewed. The risks                            and benefits of the procedure and the sedation                            options and risks were discussed with the patient.                            All questions were answered, and informed consent                            was obtained. Prior Anticoagulants: The patient has                            taken no previous anticoagulant or antiplatelet                            agents. ASA Grade Assessment: II - A patient with                            mild systemic disease. After reviewing the risks                            and benefits, the patient was deemed in                            satisfactory condition to undergo the procedure.                           After obtaining informed consent, the endoscope was  passed under direct vision. Throughout the                            procedure, the patient's blood pressure, pulse, and                            oxygen saturations were monitored continuously. The                            Endoscope was introduced through the mouth, and                            advanced to the second part of duodenum. The upper                            GI endoscopy was  accomplished without difficulty.                            The patient tolerated the procedure well. Scope In: Scope Out: Findings:                 The esophagus was normal.                           Multiple erosions and small ulcers were found in                            the gastric antrum. Biopsies were taken with a cold                            forceps for Helicobacter pylori testing using                            CLOtest.                           The stomach was otherwise normal.                           The examined duodenum was normal.                           The cardia and gastric fundus were normal on                            retroflexion. Complications:            No immediate complications. Estimated Blood Loss:     Estimated blood loss: none. Impression:               1. Multiple antral erosions and small ulcers. This                            may result in iron deficiency anemia                           2. Otherwise  normal EGD. Recommendation:           1. Resume previous diet                           2. Follow-up CLO biopsy                           3. Prescribe omeprazole 40 mg daily; #30; 11 refills                           4. Continue iron and B12 replacement therapy under                            the guidance of Dr. Jenny Reichmann                           5. Resume general medical care with Dr. Jenny Reichmann. GI                            follow-up as needed Docia Chuck. Henrene Pastor, MD 06/10/2019 4:16:16 PM This report has been signed electronically.

## 2019-06-10 NOTE — Patient Instructions (Signed)
Handout given for peptic ulcer disease.  Await pathology results for CLO test.  Continue Iron and B12 therapy.  YOU HAD AN ENDOSCOPIC PROCEDURE TODAY AT [facility]:   Refer to the procedure report that was given to you for any specific questions about what was found during the examination.  If the procedure report does not answer your questions, please call your gastroenterologist to clarify.  If you requested that your care partner not be given the details of your procedure findings, then the procedure report has been included in a sealed envelope for you to review at your convenience later.  YOU SHOULD EXPECT: Some feelings of bloating in the abdomen. Passage of more gas than usual.  Walking can help get rid of the air that was put into your GI tract during the procedure and reduce the bloating. If you had a lower endoscopy (such as a colonoscopy or flexible sigmoidoscopy) you may notice spotting of blood in your stool or on the toilet paper. If you underwent a bowel prep for your procedure, you may not have a normal bowel movement for a few days.  Please Note:  You might notice some irritation and congestion in your nose or some drainage.  This is from the oxygen used during your procedure.  There is no need for concern and it should clear up in a day or so.  SYMPTOMS TO REPORT IMMEDIATELY:   Following upper endoscopy (EGD)  Vomiting of blood or coffee ground material  New chest pain or pain under the shoulder blades  Painful or persistently difficult swallowing  New shortness of breath  Fever of 100F or higher  Black, tarry-looking stools  For urgent or emergent issues, a gastroenterologist can be reached at any hour by calling [phone].   DIET:  We do recommend a small meal at first, but then you may proceed to your regular diet.  Drink plenty of fluids but you should avoid alcoholic beverages for 24 hours.  ACTIVITY:  You should plan to take it easy for the  rest of today and you should NOT DRIVE or use heavy machinery until tomorrow (because of the sedation medicines used during the test).    FOLLOW UP: Our staff will call the number listed on your records 48-72 hours following your procedure to check on you and address any questions or concerns that you may have regarding the information given to you following your procedure. If we do not reach you, we will leave a message.  We will attempt to reach you two times.  During this call, we will ask if you have developed any symptoms of COVID 19. If you develop any symptoms (ie: fever, flu-like symptoms, shortness of breath, cough etc.) before then, please call [phone].  If you test positive for Covid 19 in the 2 weeks post procedure, please call and report this information to [place].    If any biopsies were taken you will be contacted by phone or by letter within the next 1-3 weeks.  Please call us at [phone] if you have not heard about the biopsies in 3 weeks.    SIGNATURES/CONFIDENTIALITY: You and/or your care partner have signed paperwork which will be entered into your electronic medical record.  These signatures attest to the fact that that the information above on your After Visit Summary has been reviewed and is understood.  Full responsibility of the confidentiality of this discharge information lies with you and/or your care-partner.

## 2019-06-10 NOTE — Progress Notes (Signed)
Report to PACU, RN, vss, BBS= Clear.  

## 2019-06-11 LAB — HELICOBACTER PYLORI SCREEN-BIOPSY: UREASE: NEGATIVE

## 2019-06-14 ENCOUNTER — Telehealth: Payer: Self-pay

## 2019-06-14 NOTE — Telephone Encounter (Signed)
  Follow up Call-  Call back number 06/10/2019 09/30/2016  Post procedure Call Back phone  # [phone]  Permission to leave phone message Yes Yes  Some recent data might be hidden     Patient questions:  Do you have a fever, pain , or abdominal swelling? No. Pain Score  0 *  Have you tolerated food without any problems? Yes.    Have you been able to return to your normal activities? Yes.    Do you have any questions about your discharge instructions: Diet   No. Medications  No. Follow up visit  No.  Do you have questions or concerns about your Care? No.  Actions: * If pain score is 4 or above: No action needed, pain <4.  1. Have you developed a fever since your procedure? no  2.   Have you had an respiratory symptoms (SOB or cough) since your procedure? no  3.   Have you tested positive for COVID 19 since your procedure no  4.   Have you had any family members/close contacts diagnosed with the COVID 19 since your procedure?  no   If yes to any of these questions please route to Joylene John, RN and Alphonsa Gin, Therapist, sports.

## 2019-06-21 ENCOUNTER — Other Ambulatory Visit: Payer: Self-pay | Admitting: Internal Medicine

## 2019-06-30 ENCOUNTER — Encounter: Payer: Self-pay | Admitting: Internal Medicine

## 2019-06-30 MED ORDER — LANCETS MISC: 0 refills | Status: AC

## 2019-06-30 MED ORDER — ONETOUCH VERIO W/DEVICE KIT: PACK | 0 refills | Status: DC

## 2019-06-30 MED ORDER — ONETOUCH VERIO VI STRP: ORAL_STRIP | 12 refills | Status: DC

## 2019-07-05 ENCOUNTER — Ambulatory Visit (INDEPENDENT_AMBULATORY_CARE_PROVIDER_SITE_OTHER): Payer: BC Managed Care – PPO

## 2019-07-05 DIAGNOSIS — E538 Deficiency of other specified B group vitamins: Secondary | ICD-10-CM | POA: Diagnosis not present

## 2019-07-05 MED ORDER — CYANOCOBALAMIN 1000 MCG/ML IJ SOLN
1000.0000 ug | Freq: Once | INTRAMUSCULAR | Status: AC
  Administered 2019-07-05: 1000 ug via INTRAMUSCULAR

## 2019-07-05 NOTE — Progress Notes (Signed)
Medical screening examination/treatment/procedure(s) were performed by non-physician practitioner and as supervising physician I was immediately available for consultation/collaboration. I agree with above. James John, MD   

## 2019-07-26 ENCOUNTER — Other Ambulatory Visit: Payer: Self-pay | Admitting: Family Medicine

## 2019-08-05 ENCOUNTER — Other Ambulatory Visit: Payer: Self-pay

## 2019-08-05 ENCOUNTER — Ambulatory Visit (INDEPENDENT_AMBULATORY_CARE_PROVIDER_SITE_OTHER): Payer: BC Managed Care – PPO

## 2019-08-05 DIAGNOSIS — E538 Deficiency of other specified B group vitamins: Secondary | ICD-10-CM

## 2019-08-05 MED ORDER — CYANOCOBALAMIN 1000 MCG/ML IJ SOLN
1000.0000 ug | Freq: Once | INTRAMUSCULAR | Status: AC
  Administered 2019-08-05: 1000 ug via INTRAMUSCULAR

## 2019-08-05 NOTE — Progress Notes (Signed)
Medical screening examination/treatment/procedure(s) were performed by non-physician practitioner and as supervising physician I was immediately available for consultation/collaboration. I agree with above. James John, MD   

## 2019-09-02 ENCOUNTER — Ambulatory Visit (INDEPENDENT_AMBULATORY_CARE_PROVIDER_SITE_OTHER): Payer: BC Managed Care – PPO

## 2019-09-02 DIAGNOSIS — E538 Deficiency of other specified B group vitamins: Secondary | ICD-10-CM

## 2019-09-02 MED ORDER — CYANOCOBALAMIN 1000 MCG/ML IJ SOLN
1000.0000 ug | Freq: Once | INTRAMUSCULAR | Status: AC
  Administered 2019-09-02: 11:00:00 1000 ug via INTRAMUSCULAR

## 2019-09-03 ENCOUNTER — Ambulatory Visit: Payer: BC Managed Care – PPO

## 2019-09-07 NOTE — Progress Notes (Signed)
Medical screening examination/treatment/procedure(s) were performed by non-physician practitioner and as supervising physician I was immediately available for consultation/collaboration. I agree with above. Darrion Macaulay, MD   

## 2019-09-08 ENCOUNTER — Encounter: Payer: BC Managed Care – PPO | Admitting: Internal Medicine

## 2019-09-15 ENCOUNTER — Telehealth: Payer: Self-pay

## 2019-09-15 NOTE — Telephone Encounter (Signed)
Copied from [place] [id number]. Topic: General - Other >> Sep 15, 2019  2:49 PM Leward Quan A wrote: Reason for CRM: Patient called to say that her grandson has been diagnosed with strep throat.She state that she had a sore throat on 09/14/2019 but took some cold medicine and it went away but since it is evening now its feeling scratchy again. Please advise Ph# [phone]   F/u  Call the patient, virtual visit appt set up for  09/16/19 .

## 2019-09-16 ENCOUNTER — Encounter: Payer: Self-pay | Admitting: Internal Medicine

## 2019-09-16 ENCOUNTER — Ambulatory Visit (INDEPENDENT_AMBULATORY_CARE_PROVIDER_SITE_OTHER): Payer: BC Managed Care – PPO | Admitting: Internal Medicine

## 2019-09-16 ENCOUNTER — Other Ambulatory Visit: Payer: Self-pay

## 2019-09-16 DIAGNOSIS — E119 Type 2 diabetes mellitus without complications: Secondary | ICD-10-CM

## 2019-09-16 DIAGNOSIS — J029 Acute pharyngitis, unspecified: Secondary | ICD-10-CM | POA: Insufficient documentation

## 2019-09-16 DIAGNOSIS — I10 Essential (primary) hypertension: Secondary | ICD-10-CM | POA: Diagnosis not present

## 2019-09-16 HISTORY — DX: Acute pharyngitis, unspecified: J02.9

## 2019-09-16 MED ORDER — DOXYCYCLINE HYCLATE 100 MG PO TABS: 100.0000 mg | ORAL_TABLET | Freq: Two times a day (BID) | ORAL | 0 refills | Status: DC

## 2019-09-16 NOTE — Assessment & Plan Note (Signed)
enouraged to f/u BP at home and next visit

## 2019-09-16 NOTE — Progress Notes (Signed)
Patient ID: Ruth Crawford, female   DOB: February 27, 1956, 63 y.o.   MRN: [id number]  Virtual Visit via Video Note  I connected with Ruth Crawford on 09/16/19 at 12:40 PM EST by a video enabled telemedicine application and verified that I am speaking with the correct person using two identifiers.  Location: Patient: at home Provider: at office   I discussed the limitations of evaluation and management by telemedicine and the availability of in person appointments. The patient expressed understanding and agreed to proceed.  History of Present Illness: Here with 2 days acute onset ST with low grade temp, HA and fatigue, without sinus congestion, cough, wheezing and Pt denies chest pain, increased sob or doe, wheezing, orthopnea, PND, increased LE swelling, palpitations, dizziness or syncope. Grandson whe has kept dx with strep throat yesterday. Pt denies new neurological symptoms such as new headache, or facial or extremity weakness or numbness   Pt denies polydipsia, polyuria, Past Medical History:  Diagnosis Date  . Allergy   . ANXIETY   . COMMON MIGRAINE   . DEPRESSION   . DIABETES MELLITUS, TYPE II   . GERD (gastroesophageal reflux disease) 10/17/2012  . HYPERLIPIDEMIA   . HYPERTENSION   . IDA (iron deficiency anemia)   . POLYP, GALLBLADDER   . RLS (restless legs syndrome)   . Sickle cell trait Ruth Crawford)    Past Surgical History:  Procedure Laterality Date  . ABDOMINAL HYSTERECTOMY  2000  . COLONOSCOPY    . [place], 2016, 2017   s/p    reports that she has never smoked. She has never used smokeless tobacco. She reports current alcohol use. She reports that she does not use drugs. family history includes Alcohol abuse in her mother; Arthritis in her mother; Diabetes in her brother and mother; Heart attack in her father; Heart disease in her father; Liver cancer in her mother; Stroke in her father. Allergies  Allergen Reactions  . Demerol [Meperidine] Other (See Comments)     Per pt: unknown  . Prednisone Itching and Rash   Current Outpatient Medications on File Prior to Visit  Medication Sig Dispense Refill  . Aspirin-Acetaminophen-Caffeine (EXCEDRIN MIGRAINE PO) Take by mouth as needed.    Marland Kitchen azelastine (OPTIVAR) 0.05 % ophthalmic solution Place 1 drop into both eyes 2 (two) times daily. 6 mL 12  . Blood Glucose Monitoring Suppl (ONETOUCH VERIO) w/Device KIT Use as directed daily E11.9 1 kit 0  . diphenhydrAMINE (BENADRYL) 25 MG tablet Take 25 mg by mouth daily as needed.    . gabapentin (NEURONTIN) 300 MG capsule Take 1 capsule by mouth at bedtime 90 capsule 0  . glipiZIDE (GLUCOTROL XL) 10 MG 24 hr tablet Take 1 tablet (10 mg total) by mouth daily with breakfast. 90 tablet 3  . glucose blood (ONETOUCH VERIO) test strip Use as instructed daily E11.9 100 each 12  . iron polysaccharides (NU-IRON) 150 MG capsule Take 1 capsule (150 mg total) by mouth daily. 90 capsule 1  . lisinopril (ZESTRIL) 20 MG tablet Take 1 tablet by mouth once daily 90 tablet 0  . lovastatin (MEVACOR) 40 MG tablet Take 1 tablet by mouth once daily 90 tablet 1  . metFORMIN (GLUCOPHAGE) 1000 MG tablet TAKE 1 TABLET BY MOUTH TWICE DAILY WITH MEALS 180 tablet 1  . omeprazole (PRILOSEC) 40 MG capsule Take 1 capsule (40 mg total) by mouth daily. 30 capsule 11  . phenylephrine (SUDAFED PE) 10 MG TABS tablet Take 10 mg  by mouth daily.    . Exenatide ER (BYDUREON) 2 MG PEN Inject 2 mg into the skin once a week. 12 each 3  . hydrocortisone (ANUSOL-HC) 2.5 % rectal cream Place 1 application rectally 2 (two) times daily. (Patient not taking: Reported on 09/16/2019) 30 g 0  . hydrocortisone (ANUSOL-HC) 25 MG suppository Place 1 suppository (25 mg total) rectally every 12 (twelve) hours. (Patient not taking: Reported on 09/16/2019) 12 suppository 1  . Lancets MISC Use as directed daily E11.9 100 each 0   No current facility-administered medications on file prior to visit.     Observations/Objective: Alert, NAD, appropriate mood and affect, resps normal, cn 2-12 intact, moves all 4s, no visible rash or swelling Lab Results  Component Value Date   WBC 8.5 05/31/2019   HGB 11.4 (L) 05/31/2019   HCT 35.2 (L) 05/31/2019   PLT 421.0 (H) 05/31/2019   GLUCOSE 257 (H) 04/19/2019   CHOL 126 04/19/2019   TRIG 120.0 04/19/2019   HDL 56.00 04/19/2019   LDLCALC 46 04/19/2019   ALT 8 04/19/2019   AST 10 04/19/2019   NA 140 04/19/2019   K 4.0 04/19/2019   CL 105 04/19/2019   CREATININE 0.68 04/19/2019   BUN 17 04/19/2019   CO2 26 04/19/2019   TSH 0.95 08/26/2018   INR 1.0 08/26/2018   HGBA1C 7.9 (H) 04/19/2019   MICROALBUR 1.1 08/26/2018   Assessment and Plan: See notes  Follow Up Instructions: See notes   I discussed the assessment and treatment plan with the patient. The patient was provided an opportunity to ask questions and all were answered. The patient agreed with the plan and demonstrated an understanding of the instructions.   The patient was advised to call back or seek an in-person evaluation if the symptoms worsen or if the condition fails to improve as anticipated.   Cathlean Cower, MD

## 2019-09-16 NOTE — Assessment & Plan Note (Signed)
stable overall by history and exam, recent data reviewed with pt, and pt to continue medical treatment as before,  to f/u any worsening symptoms or concerns  

## 2019-09-16 NOTE — Assessment & Plan Note (Signed)
Mild to mod, for antibx course,  to f/u any worsening symptoms or concerns 

## 2019-09-16 NOTE — Patient Instructions (Signed)
Please take all new medication as prescribed  Please continue all other medications as before, and refills have been done if requested.  Please have the pharmacy call with any other refills you may need.  Please continue your efforts at being more active, low cholesterol diet, and weight control.  Please keep your appointments with your specialists as you may have planned    

## 2019-09-20 NOTE — Telephone Encounter (Signed)
Pt was able to have virtual 09/16/19.Marland KitchenRaechel Chute

## 2019-09-23 ENCOUNTER — Encounter: Payer: Self-pay | Admitting: Internal Medicine

## 2019-10-04 ENCOUNTER — Other Ambulatory Visit: Payer: Self-pay

## 2019-10-04 ENCOUNTER — Ambulatory Visit (INDEPENDENT_AMBULATORY_CARE_PROVIDER_SITE_OTHER): Payer: BC Managed Care – PPO | Admitting: *Deleted

## 2019-10-04 DIAGNOSIS — E538 Deficiency of other specified B group vitamins: Secondary | ICD-10-CM

## 2019-10-04 MED ORDER — CYANOCOBALAMIN 1000 MCG/ML IJ SOLN
1000.0000 ug | Freq: Once | INTRAMUSCULAR | Status: AC
  Administered 2019-10-04: 1000 ug via INTRAMUSCULAR

## 2019-10-09 NOTE — Progress Notes (Signed)
Medical screening examination/treatment/procedure(s) were performed by non-physician practitioner and as supervising physician I was immediately available for consultation/collaboration. I agree with above. Mishaal Lansdale, MD   

## 2019-10-20 ENCOUNTER — Encounter: Payer: Self-pay | Admitting: Family Medicine

## 2019-10-20 ENCOUNTER — Other Ambulatory Visit: Payer: Self-pay | Admitting: Internal Medicine

## 2019-10-21 ENCOUNTER — Other Ambulatory Visit: Payer: Self-pay

## 2019-10-21 MED ORDER — DICLOFENAC SODIUM 2 % EX SOLN: 2.0000 g | Freq: Two times a day (BID) | CUTANEOUS | 3 refills | Status: DC

## 2019-10-25 ENCOUNTER — Encounter: Payer: Self-pay | Admitting: Internal Medicine

## 2019-10-25 ENCOUNTER — Ambulatory Visit (INDEPENDENT_AMBULATORY_CARE_PROVIDER_SITE_OTHER): Payer: BC Managed Care – PPO | Admitting: Internal Medicine

## 2019-10-25 ENCOUNTER — Other Ambulatory Visit: Payer: Self-pay

## 2019-10-25 VITALS — BP 144/78 | HR 88 | Temp 98.8°F | Ht 60.0 in | Wt 116.6 lb

## 2019-10-25 DIAGNOSIS — G2581 Restless legs syndrome: Secondary | ICD-10-CM

## 2019-10-25 DIAGNOSIS — E611 Iron deficiency: Secondary | ICD-10-CM

## 2019-10-25 DIAGNOSIS — E538 Deficiency of other specified B group vitamins: Secondary | ICD-10-CM

## 2019-10-25 DIAGNOSIS — Z0001 Encounter for general adult medical examination with abnormal findings: Secondary | ICD-10-CM

## 2019-10-25 DIAGNOSIS — E119 Type 2 diabetes mellitus without complications: Secondary | ICD-10-CM | POA: Diagnosis not present

## 2019-10-25 HISTORY — DX: Iron deficiency: E61.1

## 2019-10-25 HISTORY — DX: Deficiency of other specified B group vitamins: E53.8

## 2019-10-25 LAB — MICROALBUMIN / CREATININE URINE RATIO
Creatinine,U: 53.6 mg/dL
Microalb Creat Ratio: 3.4 mg/g (ref 0.0–30.0)
Microalb, Ur: 1.8 mg/dL (ref 0.0–1.9)

## 2019-10-25 LAB — HEPATIC FUNCTION PANEL
ALT: 10 U/L (ref 0–35)
AST: 12 U/L (ref 0–37)
Albumin: 4.2 g/dL (ref 3.5–5.2)
Alkaline Phosphatase: 66 U/L (ref 39–117)
Bilirubin, Direct: 0.1 mg/dL (ref 0.0–0.3)
Total Bilirubin: 0.3 mg/dL (ref 0.2–1.2)
Total Protein: 7.1 g/dL (ref 6.0–8.3)

## 2019-10-25 LAB — LIPID PANEL
Cholesterol: 126 mg/dL (ref 0–200)
HDL: 56.8 mg/dL (ref >39.00)
LDL Cholesterol: 54 mg/dL (ref 0–99)
NonHDL: 69.14
Total CHOL/HDL Ratio: 2
Triglycerides: 75 mg/dL (ref 0.0–149.0)
VLDL: 15 mg/dL (ref 0.0–40.0)

## 2019-10-25 LAB — CBC WITH DIFFERENTIAL/PLATELET
Basophils Absolute: 0.2 10*3/uL — ABNORMAL HIGH (ref 0.0–0.1)
Basophils Relative: 2 % (ref 0.0–3.0)
Eosinophils Absolute: 0.3 10*3/uL (ref 0.0–0.7)
Eosinophils Relative: 3.4 % (ref 0.0–5.0)
HCT: 35 % — ABNORMAL LOW (ref 36.0–46.0)
Hemoglobin: 11.5 g/dL — ABNORMAL LOW (ref 12.0–15.0)
Lymphocytes Relative: 33.1 % (ref 12.0–46.0)
Lymphs Abs: 2.6 10*3/uL (ref 0.7–4.0)
MCHC: 32.9 g/dL (ref 30.0–36.0)
MCV: 79.3 fl (ref 78.0–100.0)
Monocytes Absolute: 0.6 10*3/uL (ref 0.1–1.0)
Monocytes Relative: 7.3 % (ref 3.0–12.0)
Neutro Abs: 4.3 10*3/uL (ref 1.4–7.7)
Neutrophils Relative %: 54.2 % (ref 43.0–77.0)
Platelets: 387 10*3/uL (ref 150.0–400.0)
RBC: 4.41 Mil/uL (ref 3.87–5.11)
RDW: 15.6 % — ABNORMAL HIGH (ref 11.5–15.5)
WBC: 7.9 10*3/uL (ref 4.0–10.5)

## 2019-10-25 LAB — URINALYSIS, ROUTINE W REFLEX MICROSCOPIC
Bilirubin Urine: NEGATIVE
Hgb urine dipstick: NEGATIVE
Ketones, ur: NEGATIVE
Leukocytes,Ua: NEGATIVE
Nitrite: NEGATIVE
RBC / HPF: NONE SEEN (ref 0–?)
Specific Gravity, Urine: 1.015 (ref 1.000–1.030)
Total Protein, Urine: NEGATIVE
Urine Glucose: >=1000 — AB
Urobilinogen, UA: 0.2 (ref 0.0–1.0)
WBC, UA: NONE SEEN (ref 0–?)
pH: 6 (ref 5.0–8.0)

## 2019-10-25 LAB — BASIC METABOLIC PANEL
BUN: 12 mg/dL (ref 6–23)
CO2: 28 mEq/L (ref 19–32)
Calcium: 9.2 mg/dL (ref 8.4–10.5)
Chloride: 103 mEq/L (ref 96–112)
Creatinine, Ser: 0.63 mg/dL (ref 0.40–1.20)
GFR: 115.23 mL/min (ref >60.00)
Glucose, Bld: 207 mg/dL — ABNORMAL HIGH (ref 70–99)
Potassium: 3.6 mEq/L (ref 3.5–5.1)
Sodium: 138 mEq/L (ref 135–145)

## 2019-10-25 LAB — HEMOGLOBIN A1C: Hgb A1c MFr Bld: 7.6 % — ABNORMAL HIGH (ref 4.6–6.5)

## 2019-10-25 LAB — TSH: TSH: 0.94 u[IU]/mL (ref 0.35–4.50)

## 2019-10-25 MED ORDER — VITAMIN B-12 1000 MCG PO TABS: 1000.0000 ug | ORAL_TABLET | Freq: Every day | ORAL | 3 refills | Status: AC

## 2019-10-25 MED ORDER — FREESTYLE LIBRE 14 DAY SENSOR MISC: 1.0000 | Freq: Every day | 0 refills | Status: DC

## 2019-10-25 MED ORDER — FREESTYLE LIBRE READER DEVI: 1.0000 | 3 refills | Status: DC

## 2019-10-25 MED ORDER — ROPINIROLE HCL 1 MG PO TABS: 1.0000 mg | ORAL_TABLET | Freq: Every day | ORAL | 3 refills | Status: DC

## 2019-10-25 NOTE — Assessment & Plan Note (Signed)
stable overall by history and exam, recent data reviewed with pt, and pt to continue medical treatment as before,  to f/u any worsening symptoms or concerns  

## 2019-10-25 NOTE — Assessment & Plan Note (Signed)
Ok for change gabapentin to requip qhs prn -

## 2019-10-25 NOTE — Progress Notes (Signed)
Subjective:    Patient ID: Ruth Crawford, female    DOB: 1956/04/23, 64 y.o.   MRN: [id number]  HPI  Here for wellness and f/u;  Overall doing ok;  Pt denies Chest pain, worsening SOB, DOE, wheezing, orthopnea, PND, worsening LE edema, palpitations, dizziness or syncope.  Pt denies neurological change such as new headache, facial or extremity weakness.  Pt denies polydipsia, polyuria, or low sugar symptoms. Pt states overall good compliance with treatment and medications, good tolerability, and has been trying to follow appropriate diet.  Pt denies worsening depressive symptoms, suicidal ideation or panic. No fever, night sweats, wt loss, loss of appetite, or other constitutional symptoms.  Pt states good ability with ADL's, has low fall risk, home safety reviewed and adequate, no other significant changes in hearing or vision, and only occasionally active with exercise  Has been getting the B12 shots monthly.  Now has 69 mo old grandson with permanent custody as mom on drugs, much more stressed and tired.  CBG > 350 this am after super bowl feast last night, but usually better. Will likely need left shoulder seen per Dr Tamala Julian again as seems similar to last years right shoudler pain now s/p rot cuff surgury.  Past Medical History:  Diagnosis Date  . Allergy   . ANXIETY   . COMMON MIGRAINE   . DEPRESSION   . DIABETES MELLITUS, TYPE II   . GERD (gastroesophageal reflux disease) 10/17/2012  . HYPERLIPIDEMIA   . HYPERTENSION   . IDA (iron deficiency anemia)   . POLYP, GALLBLADDER   . RLS (restless legs syndrome)   . Sickle cell trait [facility])    Past Surgical History:  Procedure Laterality Date  . ABDOMINAL HYSTERECTOMY  2000  . COLONOSCOPY    . [place], 2016, 2017   s/p    reports that she has never smoked. She has never used smokeless tobacco. She reports current alcohol use. She reports that she does not use drugs. family history includes Alcohol abuse in her mother;  Arthritis in her mother; Diabetes in her brother and mother; Heart attack in her father; Heart disease in her father; Liver cancer in her mother; Stroke in her father. Allergies  Allergen Reactions  . Demerol [Meperidine] Other (See Comments)    Per pt: unknown  . Prednisone Itching and Rash   Current Outpatient Medications on File Prior to Visit  Medication Sig Dispense Refill  . Aspirin-Acetaminophen-Caffeine (EXCEDRIN MIGRAINE PO) Take by mouth as needed.    Marland Kitchen azelastine (OPTIVAR) 0.05 % ophthalmic solution Place 1 drop into both eyes 2 (two) times daily. 6 mL 12  . Blood Glucose Monitoring Suppl (ONETOUCH VERIO) w/Device KIT Use as directed daily E11.9 1 kit 0  . Diclofenac Sodium 2 % SOLN Place 2 g onto the skin 2 (two) times daily. 112 g 3  . diphenhydrAMINE (BENADRYL) 25 MG tablet Take 25 mg by mouth daily as needed.    . Exenatide ER (BYDUREON) 2 MG PEN Inject 2 mg into the skin once a week. 12 each 3  . FERREX 150 150 MG capsule Take 1 capsule by mouth once daily 90 capsule 1  . gabapentin (NEURONTIN) 300 MG capsule Take 1 capsule by mouth at bedtime 90 capsule 0  . glipiZIDE (GLUCOTROL XL) 10 MG 24 hr tablet Take 1 tablet (10 mg total) by mouth daily with breakfast. 90 tablet 3  . glucose blood (ONETOUCH VERIO) test strip Use as instructed daily E11.9  100 each 12  . hydrocortisone (ANUSOL-HC) 2.5 % rectal cream Place 1 application rectally 2 (two) times daily. 30 g 0  . Lancets MISC Use as directed daily E11.9 100 each 0  . lisinopril (ZESTRIL) 20 MG tablet Take 1 tablet by mouth once daily 90 tablet 0  . lovastatin (MEVACOR) 40 MG tablet Take 1 tablet by mouth once daily 90 tablet 1  . metFORMIN (GLUCOPHAGE) 1000 MG tablet TAKE 1 TABLET BY MOUTH TWICE DAILY WITH MEALS 180 tablet 1  . omeprazole (PRILOSEC) 40 MG capsule Take 1 capsule (40 mg total) by mouth daily. 30 capsule 11  . hydrocortisone (ANUSOL-HC) 25 MG suppository Place 1 suppository (25 mg total) rectally every 12  (twelve) hours. (Patient not taking: Reported on 10/25/2019) 12 suppository 1  . phenylephrine (SUDAFED PE) 10 MG TABS tablet Take 10 mg by mouth daily.     No current facility-administered medications on file prior to visit.   Review of Systems All otherwise neg per pt except for persistent dry mouth    Objective:   Physical Exam BP (!) 144/78   Pulse 88   Temp 98.8 F (37.1 C)   Ht 5' (1.524 m)   Wt 116 lb 9.6 oz (52.9 kg)   SpO2 98%   BMI 22.77 kg/m  VS noted,  Constitutional: Pt appears in NAD HENT: Head: NCAT.  Right Ear: External ear normal.  Left Ear: External ear normal.  Eyes: . Pupils are equal, round, and reactive to light. Conjunctivae and EOM are normal Nose: without d/c or deformity Neck: Neck supple. Gross normal ROM Cardiovascular: Normal rate and regular rhythm.   Pulmonary/Chest: Effort normal and breath sounds without rales or wheezing.  Abd:  Soft, NT, ND, + BS, no organomegaly Neurological: Pt is alert. At baseline orientation, motor grossly intact Skin: Skin is warm. No rashes, other new lesions, no LE edema Psychiatric: Pt behavior is normal without agitation  All otherwise neg per pt Lab Results  Component Value Date   WBC 8.5 05/31/2019   HGB 11.4 (L) 05/31/2019   HCT 35.2 (L) 05/31/2019   PLT 421.0 (H) 05/31/2019   GLUCOSE 257 (H) 04/19/2019   CHOL 126 04/19/2019   TRIG 120.0 04/19/2019   HDL 56.00 04/19/2019   LDLCALC 46 04/19/2019   ALT 8 04/19/2019   AST 10 04/19/2019   NA 140 04/19/2019   K 4.0 04/19/2019   CL 105 04/19/2019   CREATININE 0.68 04/19/2019   BUN 17 04/19/2019   CO2 26 04/19/2019   TSH 0.95 08/26/2018   INR 1.0 08/26/2018   HGBA1C 7.9 (H) 04/19/2019   MICROALBUR 1.1 08/26/2018      Assessment & Plan:

## 2019-10-25 NOTE — Assessment & Plan Note (Signed)
For iron level, consider stop iron

## 2019-10-25 NOTE — Assessment & Plan Note (Signed)

## 2019-10-25 NOTE — Patient Instructions (Signed)
Ok for the change of gabapentin to requip at bedtime for RLS  Please continue all other medications as before, and refills have been done if requested - the [place] system for blood sugars  Please have the pharmacy call with any other refills you may need.  Please continue your efforts at being more active, low cholesterol diet, and weight control.  You are otherwise up to date with prevention measures today.  Please keep your appointments with your specialists as you may have planned  Please go to the LAB at the blood drawing area for the tests to be done  You will be contacted by phone if any changes need to be made immediately.  Otherwise, you will receive a letter about your results with an explanation, but please check with MyChart first.  Please remember to sign up for MyChart if you have not done so, as this will be important to you in the future with finding out test results, communicating by private email, and scheduling acute appointments online when needed.  Please make an Appointment to return in 6 months, or sooner if needed, also with Lab Appointment for testing done 3-5 days before at the FIRST FLOOR Lab (so this is for TWO appointments - please see the scheduling desk as you leave)

## 2019-10-25 NOTE — Assessment & Plan Note (Signed)
For change to oral replacement

## 2019-10-28 ENCOUNTER — Other Ambulatory Visit: Payer: Self-pay | Admitting: Internal Medicine

## 2019-11-08 ENCOUNTER — Ambulatory Visit: Payer: BC Managed Care – PPO

## 2019-11-22 ENCOUNTER — Other Ambulatory Visit: Payer: Self-pay

## 2019-11-22 ENCOUNTER — Ambulatory Visit (INDEPENDENT_AMBULATORY_CARE_PROVIDER_SITE_OTHER): Payer: BC Managed Care – PPO

## 2019-11-22 ENCOUNTER — Encounter: Payer: Self-pay | Admitting: Family Medicine

## 2019-11-22 ENCOUNTER — Ambulatory Visit (INDEPENDENT_AMBULATORY_CARE_PROVIDER_SITE_OTHER): Payer: BC Managed Care – PPO | Admitting: Family Medicine

## 2019-11-22 VITALS — BP 120/90 | HR 91 | Ht 60.0 in | Wt 118.0 lb

## 2019-11-22 DIAGNOSIS — M25512 Pain in left shoulder: Secondary | ICD-10-CM

## 2019-11-22 DIAGNOSIS — M7552 Bursitis of left shoulder: Secondary | ICD-10-CM | POA: Diagnosis not present

## 2019-11-22 DIAGNOSIS — G8929 Other chronic pain: Secondary | ICD-10-CM

## 2019-11-22 MED ORDER — MELOXICAM 7.5 MG PO TABS: 7.5000 mg | ORAL_TABLET | Freq: Every day | ORAL | 0 refills | Status: DC

## 2019-11-22 NOTE — Progress Notes (Signed)
[place] [address] [place] Phone: [phone] Subjective:   I Ruth Crawford am serving as a Education administrator for Dr. Hulan Saas.  This visit occurred during the SARS-CoV-2 public health emergency.  Safety protocols were in place, including screening questions prior to the visit, additional usage of staff PPE, and extensive cleaning of exam room while observing appropriate contact time as indicated for disinfecting solutions.   I'm seeing this patient by the request  of:  Biagio Borg, MD  CC: Left shoulder pain  RCV:[id number]   11/18/2018 IDoing much better.  Seems to be tolerating being conservative therapy with physical therapy significantly better.  Discussed which activities of doing which wants to avoid.  Patient is to increase activity slowly over the course of neck several days.  Follow-up with me again in 4 to 8 weeks if any worsening pain otherwise as needed.  Left shoulder x-rays ordered today.  11/22/2019 Ruth Crawford is a 64 y.o. female coming in with complaint of left shoulder pain. Patient states the shoulder is painful. Feels like the right side. Pain radiates to the elbow. Forearm numbness. Loss of ROM and weakness. Patient notices some neck pain on the left side.   Onset- chronic  Location - anterior  Character- sharp, dull, achy, sore, throbbing Aggravating factors- abduction, picking things up  Reliving factors-  Therapies tried- pennsaid  Severity-  8/10 at its worse   Patient did have shoulder x-rays done 1 year ago on the left shoulder that were independently visualized by me showing the patient's had very mild demineralization and mild AC arthropathy  Past Medical History:  Diagnosis Date  . Allergy   . ANXIETY   . COMMON MIGRAINE   . DEPRESSION   . DIABETES MELLITUS, TYPE II   . GERD (gastroesophageal reflux disease) 10/17/2012  . HYPERLIPIDEMIA   . HYPERTENSION   . IDA (iron deficiency anemia)   . POLYP,  GALLBLADDER   . RLS (restless legs syndrome)   . Sickle cell trait [facility])    Past Surgical History:  Procedure Laterality Date  . ABDOMINAL HYSTERECTOMY  2000  . COLONOSCOPY    . [place], 2016, 2017   s/p   Social History   Socioeconomic History  . Marital status: Married    Spouse name: Not on file  . Number of children: 1  . Years of education: Not on file  . Highest education level: Not on file  Occupational History  . Occupation: billing clerk    Comment: at [employer]  Tobacco Use  . Smoking status: Never Smoker  . Smokeless tobacco: Never Used  Substance and Sexual Activity  . Alcohol use: Yes    Comment: occasional use  . Drug use: No  . Sexual activity: Not on file  Other Topics Concern  . Not on file  Social History Narrative  . Not on file   Social Determinants of Health   Financial Resource Strain:   . Difficulty of Paying Living Expenses: Not on file  Food Insecurity:   . Worried About Charity fundraiser in the Last Year: Not on file  . Ran Out of Food in the Last Year: Not on file  Transportation Needs:   . Lack of Transportation (Medical): Not on file  . Lack of Transportation (Non-Medical): Not on file  Physical Activity:   . Days of Exercise per Week: Not on file  . Minutes of Exercise per  Session: Not on file  Stress:   . Feeling of Stress : Not on file  Social Connections:   . Frequency of Communication with Friends and Family: Not on file  . Frequency of Social Gatherings with Friends and Family: Not on file  . Attends Religious Services: Not on file  . Active Member of Clubs or Organizations: Not on file  . Attends Archivist Meetings: Not on file  . Marital Status: Not on file   Allergies  Allergen Reactions  . Demerol [Meperidine] Other (See Comments)    Per pt: unknown  . Prednisone Itching and Rash   Family History  Problem Relation Age of Onset  . Diabetes Mother   . Liver cancer Mother    . Arthritis Mother   . Alcohol abuse Mother   . Heart disease Father   . Heart attack Father   . Stroke Father   . Diabetes Brother   . Colon cancer Neg Hx   . Esophageal cancer Neg Hx   . Rectal cancer Neg Hx   . Stomach cancer Neg Hx     Current Outpatient Medications (Endocrine & Metabolic):  Marland Kitchen  Exenatide ER (BYDUREON) 2 MG PEN, Inject 2 mg into the skin once a week. Marland Kitchen  glipiZIDE (GLUCOTROL XL) 10 MG 24 hr tablet, Take 1 tablet (10 mg total) by mouth daily with breakfast. .  metFORMIN (GLUCOPHAGE) 1000 MG tablet, TAKE 1 TABLET BY MOUTH TWICE DAILY WITH MEALS  Current Outpatient Medications (Cardiovascular):  .  lisinopril (ZESTRIL) 20 MG tablet, Take 1 tablet by mouth once daily .  lovastatin (MEVACOR) 40 MG tablet, Take 1 tablet by mouth once daily  Current Outpatient Medications (Respiratory):  .  diphenhydrAMINE (BENADRYL) 25 MG tablet, Take 25 mg by mouth daily as needed.  Current Outpatient Medications (Analgesics):  Marland Kitchen  Aspirin-Acetaminophen-Caffeine (EXCEDRIN MIGRAINE PO), Take by mouth as needed. .  meloxicam (MOBIC) 7.5 MG tablet, Take 1 tablet (7.5 mg total) by mouth daily.  Current Outpatient Medications (Hematological):  Marland Kitchen  FERREX 150 150 MG capsule, Take 1 capsule by mouth once daily .  vitamin B-12 (CYANOCOBALAMIN) 1000 MCG tablet, Take 1 tablet (1,000 mcg total) by mouth daily.  Current Outpatient Medications (Other):  .  azelastine (OPTIVAR) 0.05 % ophthalmic solution, Place 1 drop into both eyes 2 (two) times daily. .  Blood Glucose Monitoring Suppl (ONETOUCH VERIO) w/Device KIT, Use as directed daily E11.9 .  Continuous Blood Gluc Receiver (FREESTYLE LIBRE READER) DEVI, Apply 1 Device topically every 14 (fourteen) days. E11.9 .  Continuous Blood Gluc Sensor (FREESTYLE LIBRE 14 DAY SENSOR) MISC, Apply 1 Device topically daily. E11.9 .  Diclofenac Sodium 2 % SOLN, Place 2 g onto the skin 2 (two) times daily. Marland Kitchen  glucose blood (ONETOUCH VERIO) test strip, Use  as instructed daily E11.9 .  hydrocortisone (ANUSOL-HC) 2.5 % rectal cream, Place 1 application rectally 2 (two) times daily. .  hydrocortisone (ANUSOL-HC) 25 MG suppository, Place 1 suppository (25 mg total) rectally every 12 (twelve) hours. .  Lancets MISC, Use as directed daily E11.9 .  omeprazole (PRILOSEC) 40 MG capsule, Take 1 capsule (40 mg total) by mouth daily. Marland Kitchen  rOPINIRole (REQUIP) 1 MG tablet, Take 1 tablet (1 mg total) by mouth at bedtime.   Reviewed prior external information including notes and imaging from  primary care provider As well as notes that were available from care everywhere and other healthcare systems.  Past medical history, social, surgical and family history all  reviewed in electronic medical record.  No pertanent information unless stated regarding to the chief complaint.   Review of Systems:  No headache, visual changes, nausea, vomiting, diarrhea, constipation, dizziness, abdominal pain, skin rash, fevers, chills, night sweats, weight loss, swollen lymph nodes, body aches, joint swelling, chest pain, shortness of breath, mood changes. POSITIVE muscle aches  Objective  Blood pressure 120/90, pulse 91, height 5' (1.524 m), weight 118 lb (53.5 kg), SpO2 98 %.   General: No apparent distress alert and oriented x3 mood and affect normal, dressed appropriately.  HEENT: Pupils equal, extraocular movements intact  Respiratory: Patient's speak in full sentences and does not appear short of breath  Cardiovascular: No lower extremity edema, non tender, no erythema  Skin: Warm dry intact with no signs of infection or rash on extremities or on axial skeleton.  Abdomen: Soft nontender  Neuro: Cranial nerves II through XII are intact, neurovascularly intact in all extremities with 2+ DTRs and 2+ pulses.  Lymph: No lymphadenopathy of posterior or anterior cervical chain or axillae bilaterally.  Gait normal with good balance and coordination.  MSK:  Non tender with full  range of motion and good stability and symmetric strength and tone of elbows, wrist, hip, knee and ankles bilaterally.  Shoulder: Inspection reveals no abnormalities, atrophy or asymmetry. Palpation is normal with no tenderness over AC joint or bicipital groove. ROM is full in all planes. Rotator cuff strength normal throughout. No signs of impingement with negative Neer and Hawkin's tests, empty can sign. Speeds and Yergason's tests normal. No labral pathology noted with negative Obrien's, negative clunk and good stability. Normal scapular function observed. No painful arc and no drop arm sign. No apprehension sign    MSK US performed of: Left shoulder exam This study was ordered, performed, and interpreted by Charlann Boxer D.O.  Shoulder:   Supraspinatus: Very mild partial tear noted but no significant retraction Infraspinatus:  Appears normal on long and transverse views. Subscapularis:  Appears normal on long and transverse views. AC joint: Mild arthritis Glenohumeral Joint:  Appears normal without effusion. Glenoid Labrum:  Intact without visualized tears. Biceps Tendon:  Appears normal on long and transverse views, no fraying of tendon, tendon located in intertubercular groove, no subluxation with shoulder internal or external rotation. No increased power doppler signal. Impression: Acromioclavicular arthritis mild, mild intrasubstance tearing of the supraspinatus  Procedure: Real-time Ultrasound Guided Injection of left glenohumeral joint Device: GE Logiq E  Ultrasound guided injection is preferred based studies that show increased duration, increased effect, greater accuracy, decreased procedural pain, increased response rate with ultrasound guided versus blind injection.  Verbal informed consent obtained.  Time-out conducted.  Noted no overlying erythema, induration, or other signs of local infection.  Skin prepped in a sterile fashion.  Local anesthesia: Topical Ethyl  chloride.  With sterile technique and under real time ultrasound guidance:  Joint visualized.  21g 2 inch needle inserted posterior approach. Pictures taken for needle placement. Patient did have injection of 2 cc of 0.5% Marcaine, and 1cc of Kenalog 40 mg/dL. Completed without difficulty  Pain immediately resolved suggesting accurate placement of the medication.  Advised to call if fevers/chills, erythema, induration, drainage, or persistent bleeding.  Images permanently stored and available for review in the ultrasound unit.  Impression: Technically successful ultrasound guided injection.   Impression and Recommendations:     This case required medical decision making of moderate complexity. The above documentation has been reviewed and is accurate and complete Lyndal Pulley, DO  Note: This dictation was prepared with Dragon dictation along with smaller phrase technology. Any transcriptional errors that result from this process are unintentional.

## 2019-11-22 NOTE — Assessment & Plan Note (Signed)
Patient is a very small partial thickness tearing noted of the supraspinatus but more of a hypoechoic changes.  Patient has been being the primary caregiver of her grandson now and this is the social determinants of health.  Discussed icing regimen and home exercises, x-rays pending.  Attempted injection today.  Follow-up again in 4 to 8 weeks

## 2019-11-22 NOTE — Patient Instructions (Signed)
Good to see you Ordered meloxicam Use pennsaid on the shoulder Injection today See me again in 4 weeks

## 2019-12-18 ENCOUNTER — Ambulatory Visit: Payer: BC Managed Care – PPO | Attending: Internal Medicine

## 2019-12-18 DIAGNOSIS — Z23 Encounter for immunization: Secondary | ICD-10-CM

## 2019-12-18 NOTE — Progress Notes (Signed)
   Covid-19 Vaccination Clinic  Name:  Ruth Crawford    MRN: [id number] DOB: 1955/10/22  12/18/2019  [name] was observed post Covid-19 immunization for 15 minutes without incident. She was provided with Vaccine Information Sheet and instruction to access the V-Safe system.   [name] was instructed to call 911 with any severe reactions post vaccine: Marland Kitchen Difficulty breathing  . Swelling of face and throat  . A fast heartbeat  . A bad rash all over body  . Dizziness and weakness   Immunizations Administered    Name Date Dose VIS Date Route   Pfizer COVID-19 Vaccine 12/18/2019  9:40 AM 0.3 mL 08/27/2019 Intramuscular   Manufacturer: [employer], [employer]   Lot: [id number]   NDC: [address]-7

## 2019-12-20 ENCOUNTER — Encounter: Payer: Self-pay | Admitting: Family Medicine

## 2019-12-20 ENCOUNTER — Ambulatory Visit (INDEPENDENT_AMBULATORY_CARE_PROVIDER_SITE_OTHER): Payer: BC Managed Care – PPO

## 2019-12-20 ENCOUNTER — Ambulatory Visit (INDEPENDENT_AMBULATORY_CARE_PROVIDER_SITE_OTHER): Payer: BC Managed Care – PPO | Admitting: Family Medicine

## 2019-12-20 ENCOUNTER — Other Ambulatory Visit: Payer: Self-pay

## 2019-12-20 VITALS — BP 160/82 | HR 94 | Ht 60.0 in | Wt 116.0 lb

## 2019-12-20 DIAGNOSIS — M25512 Pain in left shoulder: Secondary | ICD-10-CM

## 2019-12-20 DIAGNOSIS — M19012 Primary osteoarthritis, left shoulder: Secondary | ICD-10-CM | POA: Diagnosis not present

## 2019-12-20 DIAGNOSIS — G8929 Other chronic pain: Secondary | ICD-10-CM

## 2019-12-20 MED ORDER — MELOXICAM 7.5 MG PO TABS: 7.5000 mg | ORAL_TABLET | Freq: Every day | ORAL | 0 refills | Status: DC

## 2019-12-20 NOTE — Patient Instructions (Addendum)
Good to see you Send me a message in 2 weeks If no better we will order epidural and you will see me 3 weeks after that If no better we will order MRI Refilled meloxicam

## 2019-12-20 NOTE — Assessment & Plan Note (Signed)
Chronic problem with exacerbation left shoulder was given an injection today, tolerated the procedure well, discussed icing regimen and home exercise, which activities to do which wants to avoid.  Patient is to increase activity slowly.  Follow-up again in 2 weeks and would like her to send in message.  May need the possibility of an epidural in the neck.  If continuing to fail will need to consider the possibility of MRI.  Medication management refilled meloxicam today encourage still topical anti-inflammatories

## 2019-12-20 NOTE — Progress Notes (Signed)
Ruth Crawford [address] [place] Phone: [phone] Subjective:   I Ruth Crawford am serving as a Education administrator for Dr. Hulan Crawford.  This visit occurred during the SARS-CoV-2 public health emergency.  Safety protocols were in place, including screening questions prior to the visit, additional usage of staff PPE, and extensive cleaning of exam room while observing appropriate contact time as indicated for disinfecting solutions.   I'm seeing this patient by the request  of:  Ruth Borg, MD  CC: Left shoulder pain follow-up  IOM:[id number]   11/22/2019 Patient is a very small partial thickness tearing noted of the supraspinatus but more of a hypoechoic changes.  Patient has been being the primary caregiver of her grandson now and this is the social determinants of health.  Discussed icing regimen and home exercises, x-rays pending.  Attempted injection today.  Follow-up again in 4 to 8 weeks  12/20/2019 Ruth Crawford is a 64 y.o. female coming in with complaint of left shoulder pain. Patient states at times her shoulder feels worse. States the shoulder has its days. Not great improvement.  Patient states that it seems to come and go.  Symptoms can wake her up at night or decreased range of motion.  Certain things is getting dressed sometimes can be difficult as well.       Past Medical History:  Diagnosis Date  . Allergy   . ANXIETY   . COMMON MIGRAINE   . DEPRESSION   . DIABETES MELLITUS, TYPE II   . GERD (gastroesophageal reflux disease) 10/17/2012  . HYPERLIPIDEMIA   . HYPERTENSION   . IDA (iron deficiency anemia)   . POLYP, GALLBLADDER   . RLS (restless legs syndrome)   . Sickle cell trait [facility])    Past Surgical History:  Procedure Laterality Date  . ABDOMINAL HYSTERECTOMY  2000  . COLONOSCOPY    . Ruth Crawford, 2016, 2017   s/p   Social History   Socioeconomic History  . Marital status: Married    Spouse name:  Not on file  . Number of children: 1  . Years of education: Not on file  . Highest education level: Not on file  Occupational History  . Occupation: billing clerk    Comment: at [employer]  Tobacco Use  . Smoking status: Never Smoker  . Smokeless tobacco: Never Used  Substance and Sexual Activity  . Alcohol use: Yes    Comment: occasional use  . Drug use: No  . Sexual activity: Not on file  Other Topics Concern  . Not on file  Social History Narrative  . Not on file   Social Determinants of Health   Financial Resource Strain:   . Difficulty of Paying Living Expenses:   Food Insecurity:   . Worried About Charity fundraiser in the Last Year:   . Arboriculturist in the Last Year:   Transportation Needs:   . Film/video editor (Medical):   Marland Kitchen Lack of Transportation (Non-Medical):   Physical Activity:   . Days of Exercise per Week:   . Minutes of Exercise per Session:   Stress:   . Feeling of Stress :   Social Connections:   . Frequency of Communication with Friends and Family:   . Frequency of Social Gatherings with Friends and Family:   . Attends Religious Services:   . Active Member of Clubs or Organizations:   . Attends Club or  Organization Meetings:   Marland Kitchen Marital Status:    Allergies  Allergen Reactions  . Demerol [Meperidine] Other (See Comments)    Per pt: unknown  . Prednisone Itching and Rash   Family History  Problem Relation Age of Onset  . Diabetes Mother   . Liver cancer Mother   . Arthritis Mother   . Alcohol abuse Mother   . Heart disease Father   . Heart attack Father   . Stroke Father   . Diabetes Brother   . Colon cancer Neg Hx   . Esophageal cancer Neg Hx   . Rectal cancer Neg Hx   . Stomach cancer Neg Hx     Current Outpatient Medications (Endocrine & Metabolic):  Marland Kitchen  Exenatide ER (BYDUREON) 2 MG PEN, Inject 2 mg into the skin once a week. Marland Kitchen  glipiZIDE (GLUCOTROL XL) 10 MG 24 hr tablet, Take 1 tablet (10 mg total) by mouth  daily with breakfast. .  metFORMIN (GLUCOPHAGE) 1000 MG tablet, TAKE 1 TABLET BY MOUTH TWICE DAILY WITH MEALS  Current Outpatient Medications (Cardiovascular):  .  lisinopril (ZESTRIL) 20 MG tablet, Take 1 tablet by mouth once daily .  lovastatin (MEVACOR) 40 MG tablet, Take 1 tablet by mouth once daily  Current Outpatient Medications (Respiratory):  .  diphenhydrAMINE (BENADRYL) 25 MG tablet, Take 25 mg by mouth daily as needed.  Current Outpatient Medications (Analgesics):  Marland Kitchen  Aspirin-Acetaminophen-Caffeine (EXCEDRIN MIGRAINE PO), Take by mouth as needed. .  meloxicam (MOBIC) 7.5 MG tablet, Take 1 tablet (7.5 mg total) by mouth daily. .  meloxicam (MOBIC) 7.5 MG tablet, Take 1 tablet (7.5 mg total) by mouth daily.  Current Outpatient Medications (Hematological):  Marland Kitchen  FERREX 150 150 MG capsule, Take 1 capsule by mouth once daily .  vitamin B-12 (CYANOCOBALAMIN) 1000 MCG tablet, Take 1 tablet (1,000 mcg total) by mouth daily.  Current Outpatient Medications (Other):  .  azelastine (OPTIVAR) 0.05 % ophthalmic solution, Place 1 drop into both eyes 2 (two) times daily. .  Blood Glucose Monitoring Suppl (ONETOUCH VERIO) w/Device KIT, Use as directed daily E11.9 .  Continuous Blood Gluc Receiver (FREESTYLE LIBRE READER) DEVI, Apply 1 Device topically every 14 (fourteen) days. E11.9 .  Continuous Blood Gluc Sensor (FREESTYLE LIBRE 14 DAY SENSOR) MISC, Apply 1 Device topically daily. E11.9 .  Diclofenac Sodium 2 % SOLN, Place 2 g onto the skin 2 (two) times daily. Marland Kitchen  glucose blood (ONETOUCH VERIO) test strip, Use as instructed daily E11.9 .  hydrocortisone (ANUSOL-HC) 2.5 % rectal cream, Place 1 application rectally 2 (two) times daily. .  hydrocortisone (ANUSOL-HC) 25 MG suppository, Place 1 suppository (25 mg total) rectally every 12 (twelve) hours. .  Lancets MISC, Use as directed daily E11.9 .  omeprazole (PRILOSEC) 40 MG capsule, Take 1 capsule (40 mg total) by mouth daily. Marland Kitchen  rOPINIRole  (REQUIP) 1 MG tablet, Take 1 tablet (1 mg total) by mouth at bedtime.   Reviewed prior external information including notes and imaging from  primary care provider As well as notes that were available from care everywhere and other healthcare systems.  Past medical history, social, surgical and family history all reviewed in electronic medical record.  No pertanent information unless stated regarding to the chief complaint.   Review of Systems:  No headache, visual changes, nausea, vomiting, diarrhea, constipation, dizziness, abdominal pain, skin rash, fevers, chills, night sweats, weight loss, swollen lymph nodes, body aches, joint swelling, chest pain, shortness of breath, mood changes. POSITIVE muscle  aches  Objective  Blood pressure (!) 160/82, pulse 94, height 5' (1.524 m), weight 116 lb (52.6 kg), SpO2 98 %.   General: No apparent distress alert and oriented x3 mood and affect normal, dressed appropriately.  HEENT: Pupils equal, extraocular movements intact  Respiratory: Patient's speak in full sentences and does not appear short of breath  Cardiovascular: No lower extremity edema, non tender, no erythema  Neuro: Cranial nerves II through XII are intact, neurovascularly intact in all extremities with 2+ DTRs and 2+ pulses.  Gait normal with good balance and coordination.  MSK:  tender with full range of motion and good stability and symmetric strength and tone of , elbows, wrist, hip, knee and ankles bilaterally.  Left shoulder exam shows the patient still has positive impingement.  Positive crossover and positive O'Brien's noted.  Mild tenderness diffusely.  4+ out of 5 strength compared to contralateral side.  Near full range of motion passively.  Procedure: Real-time Ultrasound Guided Injection of left acromioclavicular joint joint Device: GE Logiq E  Ultrasound guided injection is preferred based studies that show increased duration, increased effect, greater accuracy, decreased  procedural pain, increased response rate with ultrasound guided versus blind injection.  Verbal informed consent obtained.  Time-out conducted.  Noted no overlying erythema, induration, or other signs of local infection.  Skin prepped in a sterile fashion.  Local anesthesia: Topical Ethyl chloride.  With sterile technique and under real time ultrasound guidance:  Joint visualized.  25-gauge half inch needle inserted posterior approach. Pictures taken for needle placement. Patient did have injection of 1 cc of 0.5% Marcaine, and 0.5cc of Kenalog 40 mg/dL. Completed without difficulty   Pain immediately resolved suggesting accurate placement of the medication.  Advised to call if fevers/chills, erythema, induration, drainage, or persistent bleeding.  Images permanently stored and available for review in the ultrasound unit.  Impression: Technically successful ultrasound guided injection.   Impression and Recommendations:     This case required medical decision making of moderate complexity. The above documentation has been reviewed and is accurate and complete Lyndal Pulley, DO       Note: This dictation was prepared with Dragon dictation along with smaller phrase technology. Any transcriptional errors that result from this process are unintentional.

## 2019-12-21 ENCOUNTER — Encounter: Payer: Self-pay | Admitting: Family Medicine

## 2020-01-12 ENCOUNTER — Ambulatory Visit: Payer: BC Managed Care – PPO | Attending: Internal Medicine

## 2020-01-12 DIAGNOSIS — Z23 Encounter for immunization: Secondary | ICD-10-CM

## 2020-01-12 NOTE — Progress Notes (Signed)
   Covid-19 Vaccination Clinic  Name:  Ruth Crawford    MRN: [id number] DOB: [birth year]  01/12/2020  Ruth Crawford was observed post Covid-19 immunization for 15 minutes without incident. She was provided with Vaccine Information Sheet and instruction to access the V-Safe system.   Ruth Crawford was instructed to call 911 with any severe reactions post vaccine: Marland Kitchen Difficulty breathing  . Swelling of face and throat  . A fast heartbeat  . A bad rash all over body  . Dizziness and weakness   Immunizations Administered    Name Date Dose VIS Date Route   Pfizer COVID-19 Vaccine 01/12/2020  1:55 PM 0.3 mL 11/10/2018 Intramuscular   Manufacturer: [employer], [employer]   Lot: [id number]   NDC: [address]-3

## 2020-01-24 ENCOUNTER — Encounter: Payer: Self-pay | Admitting: Family Medicine

## 2020-01-25 ENCOUNTER — Other Ambulatory Visit: Payer: Self-pay

## 2020-01-25 DIAGNOSIS — M5412 Radiculopathy, cervical region: Secondary | ICD-10-CM

## 2020-01-26 ENCOUNTER — Encounter: Payer: Self-pay | Admitting: Family Medicine

## 2020-01-26 MED ORDER — MELOXICAM 7.5 MG PO TABS: 7.5000 mg | ORAL_TABLET | Freq: Every day | ORAL | 1 refills | Status: DC

## 2020-01-31 ENCOUNTER — Other Ambulatory Visit: Payer: Self-pay

## 2020-01-31 ENCOUNTER — Ambulatory Visit
Admission: RE | Admit: 2020-01-31 | Discharge: 2020-01-31 | Disposition: A | Discharge status: Home / Self Care | Payer: BC Managed Care – PPO | Source: Ambulatory Visit | Attending: Family Medicine | Admitting: Family Medicine

## 2020-01-31 ENCOUNTER — Encounter: Payer: Self-pay | Admitting: Family Medicine

## 2020-01-31 DIAGNOSIS — M5412 Radiculopathy, cervical region: Secondary | ICD-10-CM

## 2020-01-31 MED ORDER — IOPAMIDOL (ISOVUE-M 300) INJECTION 61%
1.0000 mL | Freq: Once | INTRAMUSCULAR | Status: AC | PRN
  Administered 2020-01-31: 1 mL via EPIDURAL

## 2020-01-31 MED ORDER — TRIAMCINOLONE ACETONIDE 40 MG/ML IJ SUSP (RADIOLOGY)
60.0000 mg | Freq: Once | INTRAMUSCULAR | Status: AC
  Administered 2020-01-31: 60 mg via EPIDURAL

## 2020-01-31 NOTE — Discharge Instructions (Signed)
Spinal Injection Discharge Instruction Sheet  1. You may resume a regular diet and any medications that you routinely take, including pain medications.  2. No driving the rest of the day of the procedure.  3. Light activity throughout the rest of the day.  Do not do any strenuous work, exercise, bending or lifting.  The day following the procedure, you may resume normal physical activity but you should refrain from exercising or physical therapy for at least three days.   Common Side Effects:   Headaches- take your usual medications as directed by your physician.     Restlessness or inability to sleep- you may have trouble sleeping for the next few days.  Ask your referring physician if you need any medication for sleep if over the counter sleep medications do not help.   Facial flushing or redness- this should subside within a few days.   Increased pain- a temporary increase in pain a day or two following your procedure is not unusual.  Take your pain medication as prescribed by your referring physician.  You may use ice to the injection site as needed.  Please do not use heat for 24 hours.   Leg cramps  Please contact our office at [phone] for the following symptoms:  Fever greater than 100 degrees.  Headaches unresolved with medication after 2-3 days.  Increased swelling, pain, or redness at injection site.  Thank you for visiting our office.   You may resume Excedrin today, as needed.

## 2020-02-28 ENCOUNTER — Other Ambulatory Visit: Payer: Self-pay

## 2020-02-28 ENCOUNTER — Ambulatory Visit (INDEPENDENT_AMBULATORY_CARE_PROVIDER_SITE_OTHER): Payer: BC Managed Care – PPO | Admitting: Family Medicine

## 2020-02-28 ENCOUNTER — Encounter: Payer: Self-pay | Admitting: Family Medicine

## 2020-02-28 ENCOUNTER — Ambulatory Visit: Payer: Self-pay

## 2020-02-28 VITALS — BP 136/90 | HR 80 | Ht 60.0 in | Wt 117.0 lb

## 2020-02-28 DIAGNOSIS — M25512 Pain in left shoulder: Secondary | ICD-10-CM

## 2020-02-28 DIAGNOSIS — M999 Biomechanical lesion, unspecified: Secondary | ICD-10-CM | POA: Diagnosis not present

## 2020-02-28 DIAGNOSIS — M5412 Radiculopathy, cervical region: Secondary | ICD-10-CM | POA: Diagnosis not present

## 2020-02-28 DIAGNOSIS — G8929 Other chronic pain: Secondary | ICD-10-CM | POA: Diagnosis not present

## 2020-02-28 HISTORY — DX: Biomechanical lesion, unspecified: M99.9

## 2020-02-28 NOTE — Assessment & Plan Note (Signed)
   Decision today to treat with OMT was based on Physical Exam  After verbal consent patient was treated with HVLA, ME, FPR techniques in cervical, thoracic, rib,areas, all areas are chronic   Patient tolerated the procedure well with improvement in symptoms  Patient given exercises, stretches and lifestyle modifications  See medications in patient instructions if given  Patient will follow up in 4-8 weeks 

## 2020-02-28 NOTE — Patient Instructions (Signed)
Good to see you Stand with back on the wall 5 minutes a day Scapular exercises Tried manipulations See me again in 5-6 weeks

## 2020-02-28 NOTE — Assessment & Plan Note (Signed)
History of cervical radiculopathy.  Discussed with patient about this.  Patient has had more of a brachial neuritis and radiculitis but it seems to be more secondary to degenerative disc disease.  Spine fairly well to osteopathic manipulation today.  Hopefully will be beneficial.  Discussed medication management including the Pennsaid.  We discussed the possibility of repeating the epidural.  Follow-up again in 4 to 8 weeks

## 2020-02-28 NOTE — Progress Notes (Signed)
Ruth Crawford [address] [place] Phone: [phone] Subjective:   I [name] am serving as a Education administrator for Dr. Hulan Saas.  This visit occurred during the SARS-CoV-2 public health emergency.  Safety protocols were in place, including screening questions prior to the visit, additional usage of staff PPE, and extensive cleaning of exam room while observing appropriate contact time as indicated for disinfecting solutions.   I'm seeing this patient by the request  of:  Biagio Borg, MD  CC: Pain follow-up  GBE:[id number]   12/20/2019 Chronic problem with exacerbation left shoulder was given an injection today, tolerated the procedure well, discussed icing regimen and home exercise, which activities to do which wants to avoid.  Patient is to increase activity slowly.  Follow-up again in 2 weeks and would like her to send in message.  May need the possibility of an epidural in the neck.  If continuing to fail will need to consider the possibility of MRI.  Medication management refilled meloxicam today encourage still topical anti-inflammatories  02/28/2020 [name] is a 64 y.o. female coming in with complaint of left shoulder pain. Patient states she was feeling better but now her pain has returned. Not as bad as before.  Patient says she continues to have tightness in her neck.  Seems to be more than what she would expect on a regular basis.  Patient denies any numbness or tingling that is constant but does have it down the left arm somewhat.  Did notice initially some improvement in her shoulder pain that is now slowly worsening again.    Past Medical History:  Diagnosis Date  . Allergy   . ANXIETY   . COMMON MIGRAINE   . DEPRESSION   . DIABETES MELLITUS, TYPE II   . GERD (gastroesophageal reflux disease) 10/17/2012  . HYPERLIPIDEMIA   . HYPERTENSION   . IDA (iron deficiency anemia)   . POLYP, GALLBLADDER   . RLS (restless legs  syndrome)   . Sickle cell trait [facility])    Past Surgical History:  Procedure Laterality Date  . ABDOMINAL HYSTERECTOMY  2000  . COLONOSCOPY    . [place], 2016, 2017   s/p   Social History   Socioeconomic History  . Marital status: Married    Spouse name: Not on file  . Number of children: 1  . Years of education: Not on file  . Highest education level: Not on file  Occupational History  . Occupation: billing clerk    Comment: at [employer]  Tobacco Use  . Smoking status: Never Smoker  . Smokeless tobacco: Never Used  Substance and Sexual Activity  . Alcohol use: Yes    Comment: occasional use  . Drug use: No  . Sexual activity: Not on file  Other Topics Concern  . Not on file  Social History Narrative  . Not on file   Social Determinants of Health   Financial Resource Strain:   . Difficulty of Paying Living Expenses:   Food Insecurity:   . Worried About Charity fundraiser in the Last Year:   . Arboriculturist in the Last Year:   Transportation Needs:   . Film/video editor (Medical):   Marland Kitchen Lack of Transportation (Non-Medical):   Physical Activity:   . Days of Exercise per Week:   . Minutes of Exercise per Session:   Stress:   . Feeling of Stress :  Social Connections:   . Frequency of Communication with Friends and Family:   . Frequency of Social Gatherings with Friends and Family:   . Attends Religious Services:   . Active Member of Clubs or Organizations:   . Attends Archivist Meetings:   Marland Kitchen Marital Status:    Allergies  Allergen Reactions  . Demerol [Meperidine] Other (See Comments)    Per pt: unknown  . Prednisone Itching and Rash   Family History  Problem Relation Age of Onset  . Diabetes Mother   . Liver cancer Mother   . Arthritis Mother   . Alcohol abuse Mother   . Heart disease Father   . Heart attack Father   . Stroke Father   . Diabetes Brother   . Colon cancer Neg Hx   . Esophageal cancer  Neg Hx   . Rectal cancer Neg Hx   . Stomach cancer Neg Hx     Current Outpatient Medications (Endocrine & Metabolic):  Marland Kitchen  Exenatide ER (BYDUREON) 2 MG PEN, Inject 2 mg into the skin once a week. Marland Kitchen  glipiZIDE (GLUCOTROL XL) 10 MG 24 hr tablet, Take 1 tablet (10 mg total) by mouth daily with breakfast. .  metFORMIN (GLUCOPHAGE) 1000 MG tablet, TAKE 1 TABLET BY MOUTH TWICE DAILY WITH MEALS  Current Outpatient Medications (Cardiovascular):  .  lisinopril (ZESTRIL) 20 MG tablet, Take 1 tablet by mouth once daily .  lovastatin (MEVACOR) 40 MG tablet, Take 1 tablet by mouth once daily  Current Outpatient Medications (Respiratory):  .  diphenhydrAMINE (BENADRYL) 25 MG tablet, Take 25 mg by mouth daily as needed.  Current Outpatient Medications (Analgesics):  Marland Kitchen  Aspirin-Acetaminophen-Caffeine (EXCEDRIN MIGRAINE PO), Take by mouth as needed. .  meloxicam (MOBIC) 7.5 MG tablet, Take 1 tablet (7.5 mg total) by mouth daily.  Current Outpatient Medications (Hematological):  Marland Kitchen  FERREX 150 150 MG capsule, Take 1 capsule by mouth once daily .  vitamin B-12 (CYANOCOBALAMIN) 1000 MCG tablet, Take 1 tablet (1,000 mcg total) by mouth daily.  Current Outpatient Medications (Other):  .  azelastine (OPTIVAR) 0.05 % ophthalmic solution, Place 1 drop into both eyes 2 (two) times daily. .  Blood Glucose Monitoring Suppl (ONETOUCH VERIO) w/Device KIT, Use as directed daily E11.9 .  Continuous Blood Gluc Receiver (FREESTYLE LIBRE READER) DEVI, Apply 1 Device topically every 14 (fourteen) days. E11.9 .  Continuous Blood Gluc Sensor (FREESTYLE LIBRE 14 DAY SENSOR) MISC, Apply 1 Device topically daily. E11.9 .  Diclofenac Sodium 2 % SOLN, Place 2 g onto the skin 2 (two) times daily. Marland Kitchen  glucose blood (ONETOUCH VERIO) test strip, Use as instructed daily E11.9 .  hydrocortisone (ANUSOL-HC) 2.5 % rectal cream, Place 1 application rectally 2 (two) times daily. .  hydrocortisone (ANUSOL-HC) 25 MG suppository, Place 1  suppository (25 mg total) rectally every 12 (twelve) hours. .  Lancets MISC, Use as directed daily E11.9 .  omeprazole (PRILOSEC) 40 MG capsule, Take 1 capsule (40 mg total) by mouth daily. Marland Kitchen  rOPINIRole (REQUIP) 1 MG tablet, Take 1 tablet (1 mg total) by mouth at bedtime.   Reviewed prior external information including notes and imaging from  primary care provider As well as notes that were available from care everywhere and other healthcare systems.  Past medical history, social, surgical and family history all reviewed in electronic medical record.  No pertanent information unless stated regarding to the chief complaint.   Review of Systems:  No headache, visual changes, nausea, vomiting, diarrhea,  constipation, dizziness, abdominal pain, skin rash, fevers, chills, night sweats, weight loss, swollen lymph nodes, body aches, joint swelling, chest pain, shortness of breath, mood changes. POSITIVE muscle aches  Objective  Blood pressure 136/90, pulse 80, height 5' (1.524 m), weight 117 lb (53.1 kg), SpO2 98 %.   General: No apparent distress alert and oriented x3 mood and affect normal, dressed appropriately.  HEENT: Pupils equal, extraocular movements intact  Respiratory: Patient's speak in full sentences and does not appear short of breath  Cardiovascular: No lower extremity edema, non tender, no erythema  Neuro: Cranial nerves II through XII are intact, neurovascularly intact in all extremities with 2+ DTRs and 2+ pulses.  Gait normal with good balance and coordination.  MSK: Mild arthritic changes of multiple joints  Patient's neck exam does have some loss of lordosis.  Tenderness to palpation in the paraspinal musculature of the cervical spine.  Mild positive Spurling's on the left side in the C7 distribution.  Patient does have some good grip strength though with 5 out of 5.  Osteopathic findings C2 flexed rotated and side bent right C4 flexed rotated and side bent left C6 flexed  rotated and side bent left T3 extended rotated and side bent left inhaled third rib     Impression and Recommendations:     The above documentation has been reviewed and is accurate and complete Lyndal Pulley, DO       Note: This dictation was prepared with Dragon dictation along with smaller phrase technology. Any transcriptional errors that result from this process are unintentional.

## 2020-03-01 DIAGNOSIS — M8580 Other specified disorders of bone density and structure, unspecified site: Secondary | ICD-10-CM

## 2020-03-01 HISTORY — DX: Other specified disorders of bone density and structure, unspecified site: M85.80

## 2020-03-02 ENCOUNTER — Other Ambulatory Visit: Payer: Self-pay | Admitting: Internal Medicine

## 2020-03-02 NOTE — Telephone Encounter (Signed)
Please refill as per office routine med refill policy (all routine meds refilled for 3 mo or monthly per pt preference up to one year from last visit, then month to month grace period for 3 mo, then further med refills will have to be denied)  

## 2020-03-06 DIAGNOSIS — M1990 Unspecified osteoarthritis, unspecified site: Secondary | ICD-10-CM

## 2020-03-06 HISTORY — DX: Unspecified osteoarthritis, unspecified site: M19.90

## 2020-04-10 ENCOUNTER — Encounter: Payer: Self-pay | Admitting: Family Medicine

## 2020-04-10 ENCOUNTER — Ambulatory Visit (INDEPENDENT_AMBULATORY_CARE_PROVIDER_SITE_OTHER): Payer: BC Managed Care – PPO | Admitting: Family Medicine

## 2020-04-10 ENCOUNTER — Other Ambulatory Visit: Payer: Self-pay

## 2020-04-10 VITALS — BP 136/88 | HR 100 | Ht 60.0 in | Wt 116.0 lb

## 2020-04-10 DIAGNOSIS — M5412 Radiculopathy, cervical region: Secondary | ICD-10-CM | POA: Diagnosis not present

## 2020-04-10 DIAGNOSIS — M7552 Bursitis of left shoulder: Secondary | ICD-10-CM

## 2020-04-10 MED ORDER — PREDNISONE 50 MG PO TABS
ORAL_TABLET | ORAL | 0 refills | Status: DC
Start: 2020-04-10 — End: 2020-06-06

## 2020-04-10 MED ORDER — HYDROXYZINE HCL 10 MG PO TABS
10.0000 mg | ORAL_TABLET | Freq: Three times a day (TID) | ORAL | 0 refills | Status: DC | PRN
Start: 2020-04-10 — End: 2024-08-04

## 2020-04-10 NOTE — Assessment & Plan Note (Signed)
Patient given injection, tolerated the procedure well, discussed icing regimen and home exercises.  I do believe that advanced imaging will be warranted.  Has had full-thickness rotator cuff tear previously on the contralateral side and will see if necessary for advanced imaging if patient does not respond.  Failed conservative therapy including physical therapy.

## 2020-04-10 NOTE — Progress Notes (Signed)
[place] Phone: [phone] Subjective:   Fontaine No, am serving as a scribe for Dr. Hulan Saas. This visit occurred during the SARS-CoV-2 public health emergency.  Safety protocols were in place, including screening questions prior to the visit, additional usage of staff PPE, and extensive cleaning of exam room while observing appropriate contact time as indicated for disinfecting solutions.   I'm seeing this patient by the request  of:  Biagio Borg, MD  CC: Left shoulder pain  EKC:[id number]   02/28/2020 History of cervical radiculopathy.  Discussed with patient about this.  Patient has had more of a brachial neuritis and radiculitis but it seems to be more secondary to degenerative disc disease.  Spine fairly well to osteopathic manipulation today.  Hopefully will be beneficial.  Discussed medication management including the [place].  We discussed the possibility of repeating the epidural.  Follow-up again in 4 to 8 weeks  Update 04/10/2020 ELLICE BOULTINGHOUSE is a 64 y.o. female coming in with complaint of left shoulder pain. Patient states that her pain has not changed. Continues to have left shoulder pain that radiates to her elbow. Use increases her pain. Has changed her pillow to contour pillow.  Patient states that it is starting to wake her up at night again.  Patient did have an epidural of the neck done previously that did help with the headaches but now they are starting to come back as well.    Past Medical History:  Diagnosis Date  . Allergy   . ANXIETY   . COMMON MIGRAINE   . DEPRESSION   . DIABETES MELLITUS, TYPE II   . GERD (gastroesophageal reflux disease) 10/17/2012  . HYPERLIPIDEMIA   . HYPERTENSION   . IDA (iron deficiency anemia)   . POLYP, GALLBLADDER   . RLS (restless legs syndrome)   . Sickle cell trait [facility])    Past Surgical History:  Procedure Laterality Date  . ABDOMINAL  HYSTERECTOMY  2000  . COLONOSCOPY    . [place], 2016, 2017   s/p   Social History   Socioeconomic History  . Marital status: Married    Spouse name: Not on file  . Number of children: 1  . Years of education: Not on file  . Highest education level: Not on file  Occupational History  . Occupation: billing clerk    Comment: at [employer]  Tobacco Use  . Smoking status: Never Smoker  . Smokeless tobacco: Never Used  Substance and Sexual Activity  . Alcohol use: Yes    Comment: occasional use  . Drug use: No  . Sexual activity: Not on file  Other Topics Concern  . Not on file  Social History Narrative  . Not on file   Social Determinants of Health   Financial Resource Strain:   . Difficulty of Paying Living Expenses:   Food Insecurity:   . Worried About Charity fundraiser in the Last Year:   . Arboriculturist in the Last Year:   Transportation Needs:   . Film/video editor (Medical):   Marland Kitchen Lack of Transportation (Non-Medical):   Physical Activity:   . Days of Exercise per Week:   . Minutes of Exercise per Session:   Stress:   . Feeling of Stress :   Social Connections:   . Frequency of Communication with Friends and Family:   . Frequency of Social Gatherings  with Friends and Family:   . Attends Religious Services:   . Active Member of Clubs or Organizations:   . Attends Archivist Meetings:   Marland Kitchen Marital Status:    Allergies  Allergen Reactions  . Demerol [Meperidine] Other (See Comments)    Per pt: unknown  . Prednisone Itching and Rash   Family History  Problem Relation Age of Onset  . Diabetes Mother   . Liver cancer Mother   . Arthritis Mother   . Alcohol abuse Mother   . Heart disease Father   . Heart attack Father   . Stroke Father   . Diabetes Brother   . Colon cancer Neg Hx   . Esophageal cancer Neg Hx   . Rectal cancer Neg Hx   . Stomach cancer Neg Hx     Current Outpatient Medications (Endocrine &  Metabolic):  Marland Kitchen  Exenatide ER (BYDUREON) 2 MG PEN, Inject 2 mg into the skin once a week. Marland Kitchen  glipiZIDE (GLUCOTROL XL) 10 MG 24 hr tablet, Take 1 tablet by mouth once daily with breakfast .  metFORMIN (GLUCOPHAGE) 1000 MG tablet, TAKE 1 TABLET BY MOUTH TWICE DAILY WITH MEALS .  predniSONE (DELTASONE) 50 MG tablet, Take one tablet daily for the next 5 days.  Current Outpatient Medications (Cardiovascular):  .  lisinopril (ZESTRIL) 20 MG tablet, Take 1 tablet by mouth once daily .  lovastatin (MEVACOR) 40 MG tablet, Take 1 tablet by mouth once daily  Current Outpatient Medications (Respiratory):  .  diphenhydrAMINE (BENADRYL) 25 MG tablet, Take 25 mg by mouth daily as needed.  Current Outpatient Medications (Analgesics):  Marland Kitchen  Aspirin-Acetaminophen-Caffeine (EXCEDRIN MIGRAINE PO), Take by mouth as needed. .  meloxicam (MOBIC) 7.5 MG tablet, Take 1 tablet (7.5 mg total) by mouth daily.  Current Outpatient Medications (Hematological):  Marland Kitchen  FERREX 150 150 MG capsule, Take 1 capsule by mouth once daily .  vitamin B-12 (CYANOCOBALAMIN) 1000 MCG tablet, Take 1 tablet (1,000 mcg total) by mouth daily.  Current Outpatient Medications (Other):  .  azelastine (OPTIVAR) 0.05 % ophthalmic solution, Place 1 drop into both eyes 2 (two) times daily. .  Blood Glucose Monitoring Suppl (ONETOUCH VERIO) w/Device KIT, Use as directed daily E11.9 .  Continuous Blood Gluc Receiver (FREESTYLE LIBRE READER) DEVI, Apply 1 Device topically every 14 (fourteen) days. E11.9 .  Continuous Blood Gluc Sensor (FREESTYLE LIBRE 14 DAY SENSOR) MISC, Apply 1 Device topically daily. E11.9 .  Diclofenac Sodium 2 % SOLN, Place 2 g onto the skin 2 (two) times daily. Marland Kitchen  glucose blood (ONETOUCH VERIO) test strip, Use as instructed daily E11.9 .  hydrocortisone (ANUSOL-HC) 2.5 % rectal cream, Place 1 application rectally 2 (two) times daily. .  hydrocortisone (ANUSOL-HC) 25 MG suppository, Place 1 suppository (25 mg total) rectally every  12 (twelve) hours. .  Lancets MISC, Use as directed daily E11.9 .  omeprazole (PRILOSEC) 40 MG capsule, Take 1 capsule (40 mg total) by mouth daily. Marland Kitchen  rOPINIRole (REQUIP) 1 MG tablet, Take 1 tablet (1 mg total) by mouth at bedtime. .  hydrOXYzine (ATARAX/VISTARIL) 10 MG tablet, Take 1 tablet (10 mg total) by mouth 3 (three) times daily as needed.   Reviewed prior external information including notes and imaging from  primary care provider As well as notes that were available from care everywhere and other healthcare systems.  Past medical history, social, surgical and family history all reviewed in electronic medical record.  No pertanent information unless stated regarding to  the chief complaint.   Review of Systems:  No headache, visual changes, nausea, vomiting, diarrhea, constipation, dizziness, abdominal pain, skin rash, fevers, chills, night sweats, weight loss, swollen lymph nodes, body aches, joint swelling, chest pain, shortness of breath, mood changes. POSITIVE muscle aches  Objective  Blood pressure (!) 136/88, pulse 100, height 5' (1.524 m), weight 116 lb (52.6 kg), SpO2 99 %.   General: No apparent distress alert and oriented x3 mood and affect normal, dressed appropriately.  HEENT: Pupils equal, extraocular movements intact  Respiratory: Patient's speak in full sentences and does not appear short of breath  Cardiovascular: No lower extremity edema, non tender, no erythema  Neuro: Cranial nerves II through XII are intact, neurovascularly intact in all extremities with 2+ DTRs and 2+ pulses.  Gait normal with good balance and coordination.  MSK: Left shoulder exam does have a positive impingement.  Patient does have 5-5 strength over the rotator cuff noted.  Patient does have some limited internal and external range of motion of 5 to 10 degrees.  Positive crossover and positive O'Brien's. Neck exam does have loss of lordosis and mild positive cervical radiculopathy with  Spurling's on the right side.  After informed written and verbal consent, patient was seated on exam table. Left shoulder was prepped with alcohol swab and utilizing posterior approach, patient's right glenohumeral space was injected with 4:1  marcaine 0.5%: Kenalog 20m/dL. Patient tolerated the procedure well without immediate complications.    Impression and Recommendations:     The above documentation has been reviewed and is accurate and complete ZLyndal Pulley DO       Note: This dictation was prepared with Dragon dictation along with smaller phrase technology. Any transcriptional errors that result from this process are unintentional.

## 2020-04-10 NOTE — Assessment & Plan Note (Signed)
Mild worsening with the radicular symptoms.  Has associated headaches with this.  He did respond well to the epidural previously and we will order it again.  Follow-up 4 weeks after the injection

## 2020-04-10 NOTE — Patient Instructions (Addendum)
Injected shoulder today Prescriptions at your pharmacy COOP pillow See me again in 6-8 weeks

## 2020-04-21 IMAGING — DX DG SHOULDER 2+V*R*
3 series · 3 of 3 positions shown · non-contrast
Comparison: None.

CLINICAL DATA: Shoulder pain and limited range of motion for 2
months.

EXAM:
RIGHT SHOULDER - 2+ VIEW

[grashey]
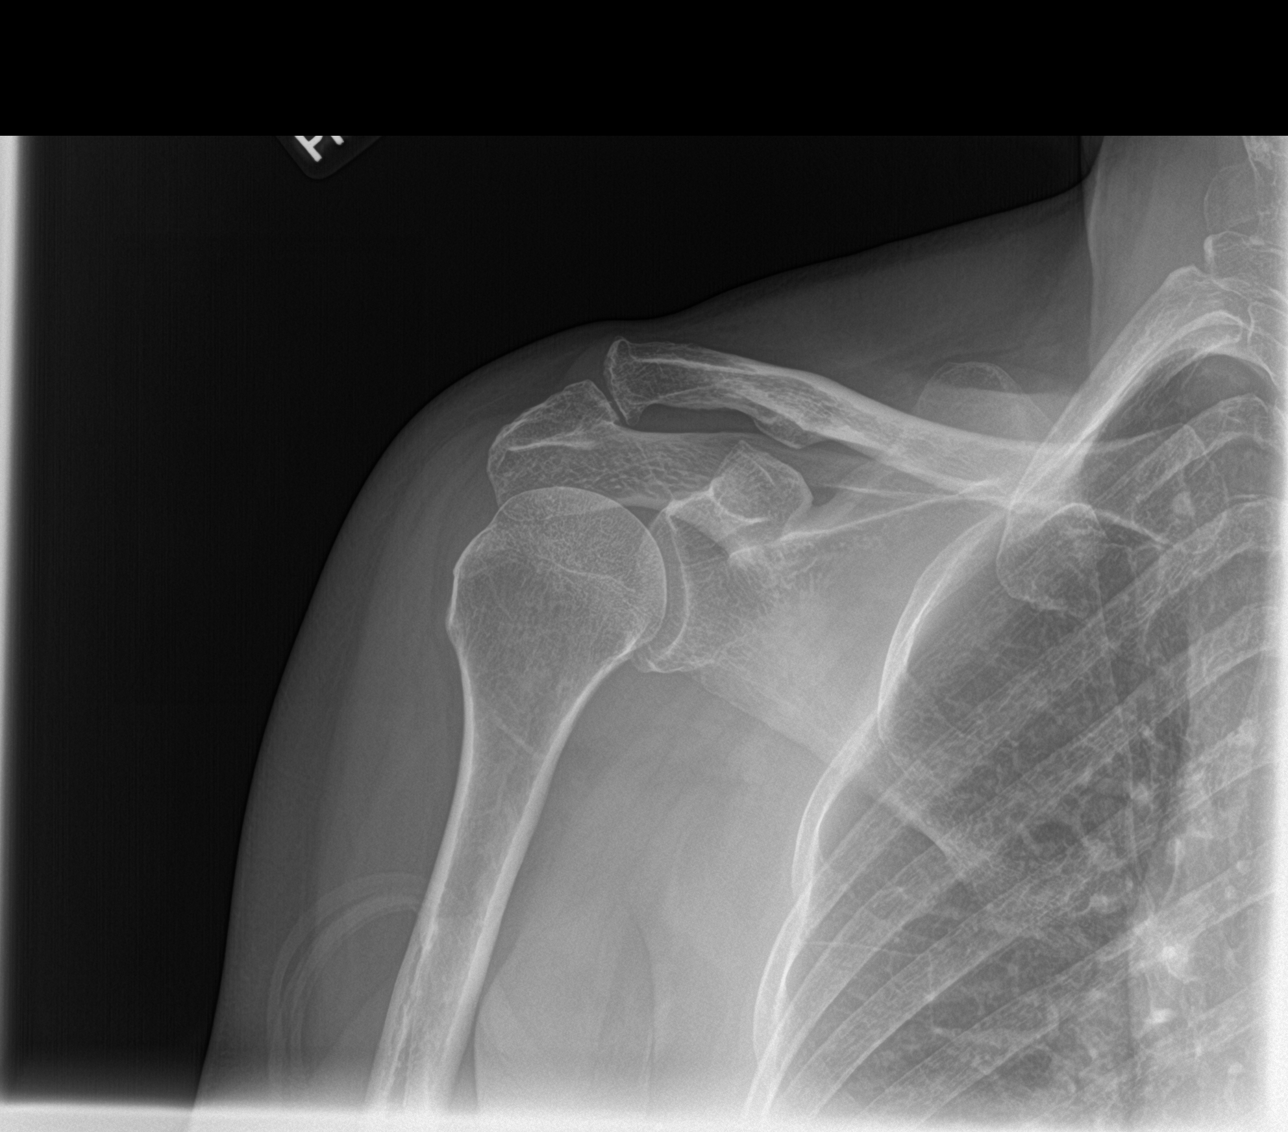

[y view]
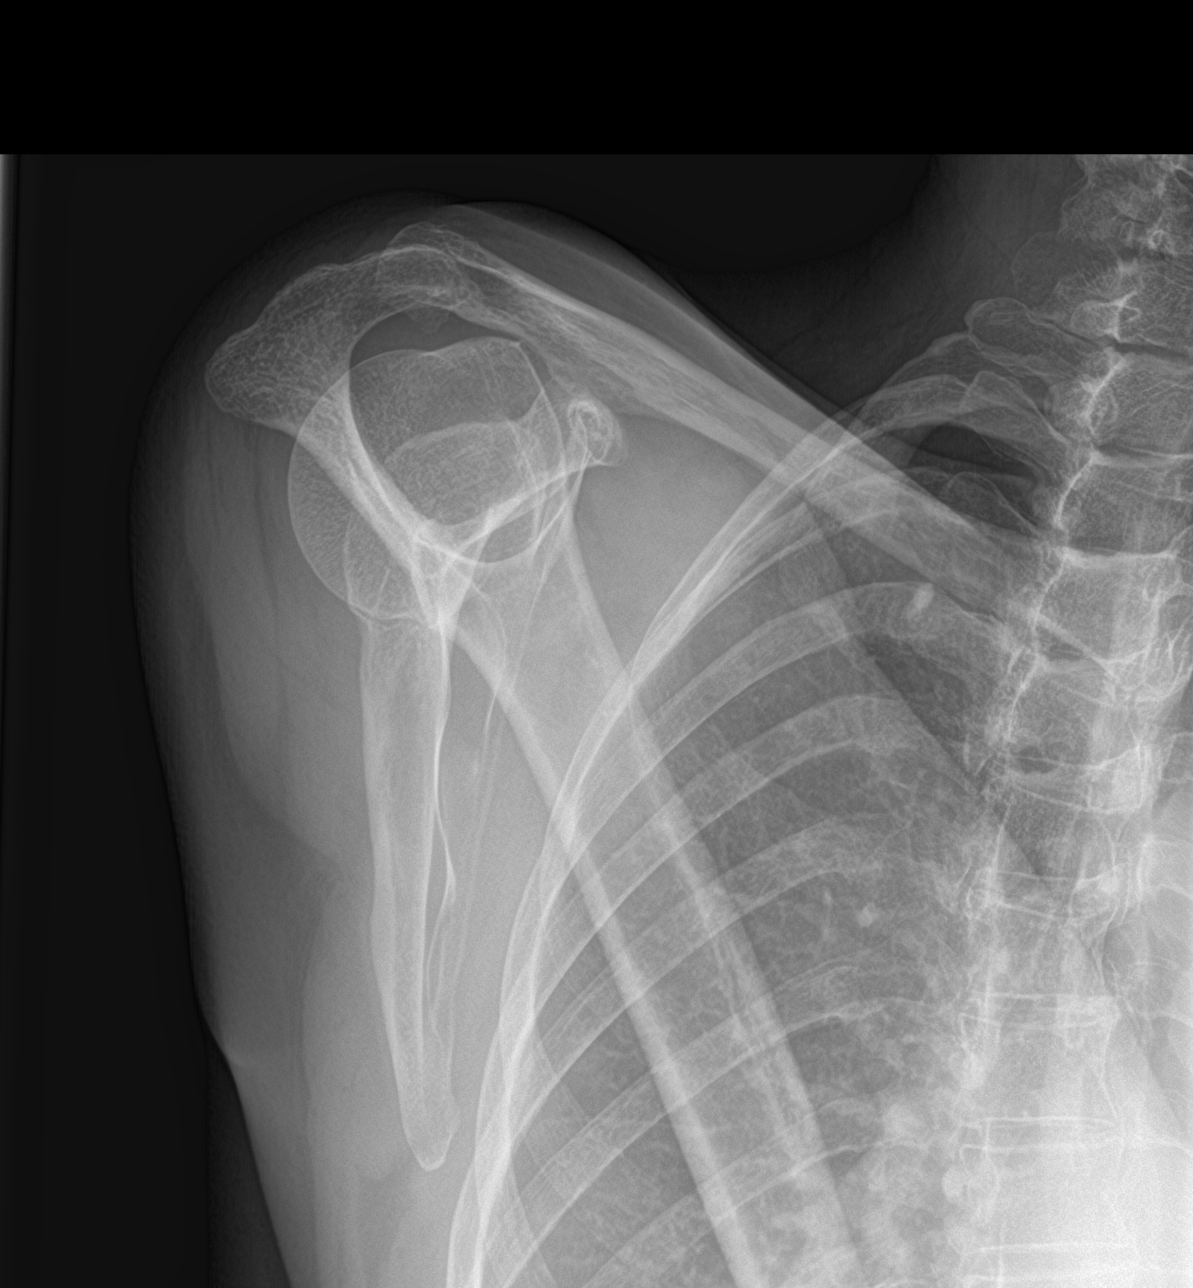

[shoulder axial]
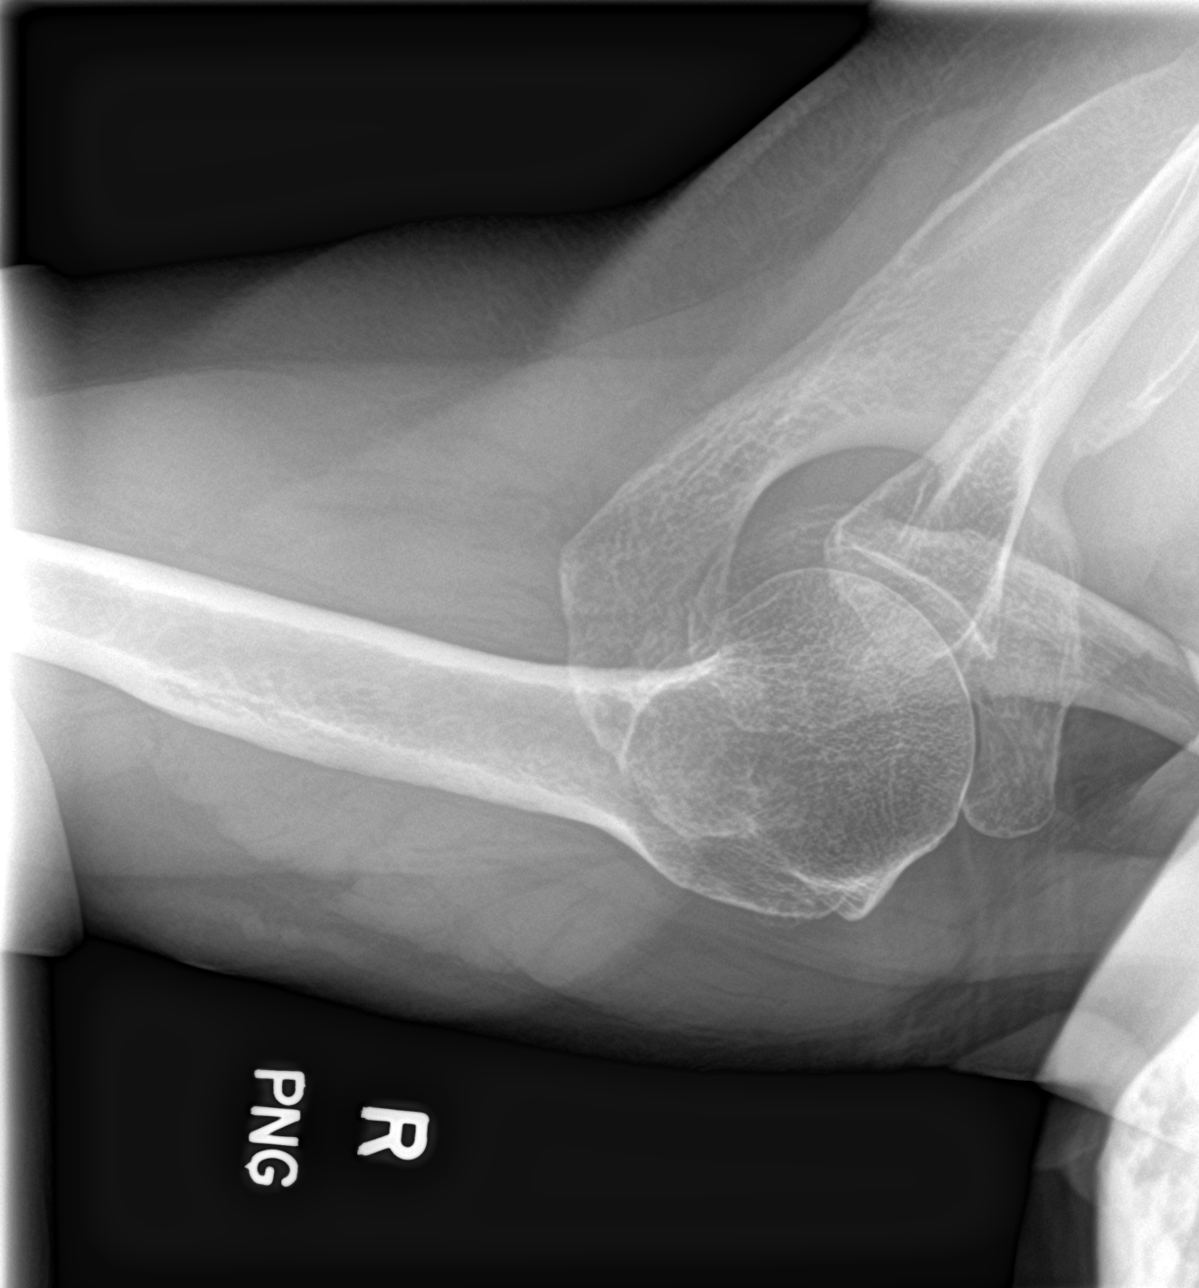

[3 of 3 positions shown; findings below may reference images not displayed]

FINDINGS: The glenohumeral joint appears normal. Slight AC joint arthropathy.
No joint effusion or abnormal soft tissue calcification.
IMPRESSION: Slight AC joint arthropathy.  Otherwise, normal right shoulder.

## 2020-04-24 ENCOUNTER — Encounter: Payer: Self-pay | Admitting: Internal Medicine

## 2020-04-26 ENCOUNTER — Encounter: Payer: Self-pay | Admitting: Internal Medicine

## 2020-04-26 ENCOUNTER — Other Ambulatory Visit: Payer: Self-pay

## 2020-04-26 ENCOUNTER — Telehealth (INDEPENDENT_AMBULATORY_CARE_PROVIDER_SITE_OTHER): Payer: BC Managed Care – PPO | Admitting: Internal Medicine

## 2020-04-26 DIAGNOSIS — K219 Gastro-esophageal reflux disease without esophagitis: Secondary | ICD-10-CM

## 2020-04-26 DIAGNOSIS — K253 Acute gastric ulcer without hemorrhage or perforation: Secondary | ICD-10-CM | POA: Diagnosis not present

## 2020-04-26 DIAGNOSIS — I10 Essential (primary) hypertension: Secondary | ICD-10-CM | POA: Diagnosis not present

## 2020-04-26 DIAGNOSIS — E119 Type 2 diabetes mellitus without complications: Secondary | ICD-10-CM | POA: Diagnosis not present

## 2020-04-26 MED ORDER — OMEPRAZOLE 40 MG PO CPDR: 40.0000 mg | DELAYED_RELEASE_CAPSULE | Freq: Every day | ORAL | 3 refills | Status: DC

## 2020-04-26 NOTE — Assessment & Plan Note (Signed)
stable overall by history and exam, recent data reviewed with pt, and pt to continue medical treatment as before,  to f/u any worsening symptoms or concerns  

## 2020-04-26 NOTE — Progress Notes (Deleted)
   Subjective:    Patient ID: Ruth Crawford, female    DOB: 1956/07/14, 64 y.o.   MRN: [id number]  HPI     Review of Systems     Objective:   Physical Exam        Assessment & Plan:

## 2020-04-26 NOTE — Patient Instructions (Signed)
Please take all new medication as prescribed 

## 2020-04-26 NOTE — Assessment & Plan Note (Addendum)
Uncontrolled, for PPI restart.,  to f/u any worsening symptoms or concerns  I spent 31 minutes in preparing to see the patient by review of recent labs, imaging and procedures, obtaining and reviewing separately obtained history, communicating with the patient and family or caregiver, ordering medications, tests or procedures, and documenting clinical information in the EHR including the differential Dx, treatment, and any further evaluation and other management of gerd, dm, htn

## 2020-04-26 NOTE — Progress Notes (Signed)
Patient ID: Ruth Crawford, female   DOB: [birth year], 64 y.o.   MRN: [id number]  Virtual Visit via Video Note  I connected with [name] on 04/26/20 at  2:20 PM EDT by a video enabled telemedicine application and verified that I am speaking with the correct person using two identifiers.  Location of all participants today Patient: at home Provider: at office   I discussed the limitations of evaluation and management by telemedicine and the availability of in person appointments. The patient expressed understanding and agreed to proceed.  History of Present Illness: Here to f/u; overall doing ok,  Pt denies chest pain, increasing sob or doe, wheezing, orthopnea, PND, increased LE swelling, palpitations, dizziness or syncope, except has had several weeks worsening reflux, but no abd pain, dysphagia, n/v, bowel change or blood. Ran out of PPI,  Peptobismol helps, TUMS not.  Last EGD per Dr Henrene Pastor sept 2020 with erosion and small ulcerations..  Pt denies new neurological symptoms such as new headache, or facial or extremity weakness or numbness.  Pt denies polydipsia, polyuria, Past Medical History:  Diagnosis Date  . Allergy   . ANXIETY   . COMMON MIGRAINE   . DEPRESSION   . DIABETES MELLITUS, TYPE II   . GERD (gastroesophageal reflux disease) 10/17/2012  . HYPERLIPIDEMIA   . HYPERTENSION   . IDA (iron deficiency anemia)   . POLYP, GALLBLADDER   . RLS (restless legs syndrome)   . Sickle cell trait [facility])    Past Surgical History:  Procedure Laterality Date  . ABDOMINAL HYSTERECTOMY  2000  . COLONOSCOPY    . [place], 2016, 2017   s/p    reports that she has never smoked. She has never used smokeless tobacco. She reports current alcohol use. She reports that she does not use drugs. family history includes Alcohol abuse in her mother; Arthritis in her mother; Diabetes in her brother and mother; Heart attack in her father; Heart disease in her father; Liver cancer in her  mother; Stroke in her father. Allergies  Allergen Reactions  . Demerol [Meperidine] Other (See Comments)    Per pt: unknown  . Prednisone Itching and Rash   Current Outpatient Medications on File Prior to Visit  Medication Sig Dispense Refill  . Aspirin-Acetaminophen-Caffeine (EXCEDRIN MIGRAINE PO) Take by mouth as needed.    Marland Kitchen azelastine (OPTIVAR) 0.05 % ophthalmic solution Place 1 drop into both eyes 2 (two) times daily. 6 mL 12  . Blood Glucose Monitoring Suppl (ONETOUCH VERIO) w/Device KIT Use as directed daily E11.9 1 kit 0  . Continuous Blood Gluc Receiver (FREESTYLE LIBRE READER) DEVI Apply 1 Device topically every 14 (fourteen) days. E11.9 6 each 3  . Continuous Blood Gluc Sensor (FREESTYLE LIBRE 14 DAY SENSOR) MISC Apply 1 Device topically daily. E11.9 1 each 0  . Diclofenac Sodium 2 % SOLN Place 2 g onto the skin 2 (two) times daily. 112 g 3  . diphenhydrAMINE (BENADRYL) 25 MG tablet Take 25 mg by mouth daily as needed.    . Exenatide ER (BYDUREON) 2 MG PEN Inject 2 mg into the skin once a week. 12 each 3  . FERREX 150 150 MG capsule Take 1 capsule by mouth once daily 90 capsule 1  . glipiZIDE (GLUCOTROL XL) 10 MG 24 hr tablet Take 1 tablet by mouth once daily with breakfast 90 tablet 2  . glucose blood (ONETOUCH VERIO) test strip Use as instructed daily E11.9 100 each 12  .  hydrocortisone (ANUSOL-HC) 2.5 % rectal cream Place 1 application rectally 2 (two) times daily. 30 g 0  . hydrOXYzine (ATARAX/VISTARIL) 10 MG tablet Take 1 tablet (10 mg total) by mouth 3 (three) times daily as needed. 30 tablet 0  . Lancets MISC Use as directed daily E11.9 100 each 0  . lisinopril (ZESTRIL) 20 MG tablet Take 1 tablet by mouth once daily 90 tablet 3  . lovastatin (MEVACOR) 40 MG tablet Take 1 tablet by mouth once daily 90 tablet 3  . meloxicam (MOBIC) 7.5 MG tablet Take 1 tablet (7.5 mg total) by mouth daily. 90 tablet 1  . metFORMIN (GLUCOPHAGE) 1000 MG tablet TAKE 1 TABLET BY MOUTH TWICE  DAILY WITH MEALS 180 tablet 1  . predniSONE (DELTASONE) 50 MG tablet Take one tablet daily for the next 5 days. 5 tablet 0  . rOPINIRole (REQUIP) 1 MG tablet Take 1 tablet (1 mg total) by mouth at bedtime. 90 tablet 3  . vitamin B-12 (CYANOCOBALAMIN) 1000 MCG tablet Take 1 tablet (1,000 mcg total) by mouth daily. 90 tablet 3   No current facility-administered medications on file prior to visit.    Observations/Objective: Alert, NAD, appropriate mood and affect, resps normal, cn 2-12 intact, moves all 4s, no visible rash or swelling Lab Results  Component Value Date   WBC 7.9 10/25/2019   HGB 11.5 (L) 10/25/2019   HCT 35.0 (L) 10/25/2019   PLT 387.0 10/25/2019   GLUCOSE 207 (H) 10/25/2019   CHOL 126 10/25/2019   TRIG 75.0 10/25/2019   HDL 56.80 10/25/2019   LDLCALC 54 10/25/2019   ALT 10 10/25/2019   AST 12 10/25/2019   NA 138 10/25/2019   K 3.6 10/25/2019   CL 103 10/25/2019   CREATININE 0.63 10/25/2019   BUN 12 10/25/2019   CO2 28 10/25/2019   TSH 0.94 10/25/2019   INR 1.0 08/26/2018   HGBA1C 7.6 (H) 10/25/2019   MICROALBUR 1.8 10/25/2019   Assessment and Plan: See notes  Follow Up Instructions: See notes   I discussed the assessment and treatment plan with the patient. The patient was provided an opportunity to ask questions and all were answered. The patient agreed with the plan and demonstrated an understanding of the instructions.   The patient was advised to call back or seek an in-person evaluation if the symptoms worsen or if the condition fails to improve as anticipated.  Cathlean Cower, MD

## 2020-04-26 NOTE — Assessment & Plan Note (Signed)
Cont to monitor at home and next visit with goal BP < 140/90

## 2020-05-01 ENCOUNTER — Ambulatory Visit
Admission: RE | Admit: 2020-05-01 | Discharge: 2020-05-01 | Disposition: A | Discharge status: Home / Self Care | Payer: BC Managed Care – PPO | Source: Ambulatory Visit | Attending: Family Medicine | Admitting: Family Medicine

## 2020-05-01 DIAGNOSIS — M5412 Radiculopathy, cervical region: Secondary | ICD-10-CM

## 2020-05-01 MED ORDER — METHYLPREDNISOLONE ACETATE 40 MG/ML INJ SUSP (RADIOLOG
120.0000 mg | Freq: Once | INTRAMUSCULAR | Status: AC
  Administered 2020-05-01: 120 mg via EPIDURAL

## 2020-05-01 MED ORDER — IOPAMIDOL (ISOVUE-M 300) INJECTION 61%
1.0000 mL | Freq: Once | INTRAMUSCULAR | Status: AC | PRN
  Administered 2020-05-01: 1 mL via EPIDURAL

## 2020-05-02 ENCOUNTER — Other Ambulatory Visit: Payer: Self-pay | Admitting: Internal Medicine

## 2020-05-03 ENCOUNTER — Telehealth: Payer: Self-pay | Admitting: Internal Medicine

## 2020-05-03 NOTE — Telephone Encounter (Signed)
DM 2 epidural injection 05/01/20 with elevated cbg 400 then 379 around 5 oclock on metformin 1000 mg bid and glipizide 10 xl Informed access nurse inform pt increase water, take glipizide another 10 mg xl max 20 mg xl daily  Call office in am for further instruction but if sx'matic rec go to the ED

## 2020-05-04 ENCOUNTER — Telehealth: Payer: Self-pay | Admitting: Internal Medicine

## 2020-05-04 NOTE — Telephone Encounter (Signed)
[facility] REPORT/CALL : ---Caller states that she had an epidural steroid on Monday. Now has a blood sugar of 400. Is on glipizide 10 mg daily and metformin 1000mg  BID. Feels anxious.  Advised call PCP now.

## 2020-05-04 NOTE — Telephone Encounter (Signed)
Pt last seen feb 2021, not sure why sugar so high  Epidural steroid can make the sugar go even higher  Ok for actos 15 mg per day - I will send  Pt should continue checking her sugars, and can take up to 45 mg actos (3 of the 15 mg pills) if the sugar ends up over 300 any day

## 2020-05-04 NOTE — Telephone Encounter (Signed)
Sent to Dr. John to advise. 

## 2020-05-09 ENCOUNTER — Encounter: Payer: Self-pay | Admitting: Internal Medicine

## 2020-05-09 NOTE — Telephone Encounter (Signed)
Was able to speak with pt and inform her of Dr.John's note of added meds and how to take them. Pt stated she understood and has no questions or concerns at this time.

## 2020-05-10 NOTE — Telephone Encounter (Signed)
Follow up message    Patient requesting status of Actos order

## 2020-05-11 MED ORDER — PIOGLITAZONE HCL 15 MG PO TABS: 15.0000 mg | ORAL_TABLET | Freq: Every day | ORAL | 3 refills | Status: DC

## 2020-06-01 NOTE — Progress Notes (Signed)
[facility] [address] [place] Phone: [phone] Subjective:   I Ruth Crawford am serving as a Education administrator for Dr. Hulan Saas.  This visit occurred during the SARS-CoV-2 public health emergency.  Safety protocols were in place, including screening questions prior to the visit, additional usage of staff PPE, and extensive cleaning of exam room while observing appropriate contact time as indicated for disinfecting solutions.   I'm seeing this patient by the request  of:  Biagio Borg, MD  CC: Left shoulder and right neck pain  ASN:[id number]   04/10/2020 Patient given injection, tolerated the procedure well, discussed icing regimen and home exercises.  I do believe that advanced imaging will be warranted.  Has had full-thickness rotator cuff tear previously on the contralateral side and will see if necessary for advanced imaging if patient does not respond.  Failed conservative therapy including physical therapy.  Update 06/05/2020 Ruth Crawford is a 64 y.o. female coming in with complaint of left shoulder and right neck pain. Patient states she is still in pain. Epidural calmed the pain for a few weeks. Last 3 weeks she has had a lot of pain in the left shoulder and neck. Has been using ice and heat that did not help. Also took 2 arthritis pills that somewhat help the pain. Patient also states she has been light headed for a few weeks. Has noticed it after doing stationary bike.     Past Medical History:  Diagnosis Date  . Allergy   . ANXIETY   . COMMON MIGRAINE   . DEPRESSION   . DIABETES MELLITUS, TYPE II   . GERD (gastroesophageal reflux disease) 10/17/2012  . HYPERLIPIDEMIA   . HYPERTENSION   . IDA (iron deficiency anemia)   . POLYP, GALLBLADDER   . RLS (restless legs syndrome)   . Sickle cell trait [facility])    Past Surgical History:  Procedure Laterality Date  . ABDOMINAL HYSTERECTOMY  2000  . COLONOSCOPY    . [place], 2016, 2017   s/p   Social History   Socioeconomic History  . Marital status: Married    Spouse name: Not on file  . Number of children: 1  . Years of education: Not on file  . Highest education level: Not on file  Occupational History  . Occupation: billing clerk    Comment: at [employer]  Tobacco Use  . Smoking status: Never Smoker  . Smokeless tobacco: Never Used  Substance and Sexual Activity  . Alcohol use: Yes    Comment: occasional use  . Drug use: No  . Sexual activity: Not on file  Other Topics Concern  . Not on file  Social History Narrative  . Not on file   Social Determinants of Health   Financial Resource Strain:   . Difficulty of Paying Living Expenses: Not on file  Food Insecurity:   . Worried About Charity fundraiser in the Last Year: Not on file  . Ran Out of Food in the Last Year: Not on file  Transportation Needs:   . Lack of Transportation (Medical): Not on file  . Lack of Transportation (Non-Medical): Not on file  Physical Activity:   . Days of Exercise per Week: Not on file  . Minutes of Exercise per Session: Not on file  Stress:   . Feeling of Stress : Not on file  Social Connections:   . Frequency of Communication with Friends  and Family: Not on file  . Frequency of Social Gatherings with Friends and Family: Not on file  . Attends Religious Services: Not on file  . Active Member of Clubs or Organizations: Not on file  . Attends Archivist Meetings: Not on file  . Marital Status: Not on file   Allergies  Allergen Reactions  . Demerol [Meperidine] Other (See Comments)    Per pt: unknown  . Prednisone Itching and Rash   Family History  Problem Relation Age of Onset  . Diabetes Mother   . Liver cancer Mother   . Arthritis Mother   . Alcohol abuse Mother   . Heart disease Father   . Heart attack Father   . Stroke Father   . Diabetes Brother   . Colon cancer Neg Hx   . Esophageal cancer Neg Hx   . Rectal  cancer Neg Hx   . Stomach cancer Neg Hx     Current Outpatient Medications (Endocrine & Metabolic):  Marland Kitchen  Exenatide ER (BYDUREON) 2 MG PEN, Inject 2 mg into the skin once a week. Marland Kitchen  glipiZIDE (GLUCOTROL XL) 10 MG 24 hr tablet, Take 1 tablet by mouth once daily with breakfast .  metFORMIN (GLUCOPHAGE) 1000 MG tablet, TAKE 1 TABLET BY MOUTH TWICE DAILY WITH MEALS .  pioglitazone (ACTOS) 15 MG tablet, Take 1 tablet (15 mg total) by mouth daily. .  predniSONE (DELTASONE) 50 MG tablet, Take one tablet daily for the next 5 days.  Current Outpatient Medications (Cardiovascular):  .  lisinopril (ZESTRIL) 20 MG tablet, Take 1 tablet by mouth once daily .  lovastatin (MEVACOR) 40 MG tablet, Take 1 tablet by mouth once daily  Current Outpatient Medications (Respiratory):  .  diphenhydrAMINE (BENADRYL) 25 MG tablet, Take 25 mg by mouth daily as needed.  Current Outpatient Medications (Analgesics):  Marland Kitchen  Aspirin-Acetaminophen-Caffeine (EXCEDRIN MIGRAINE PO), Take by mouth as needed. .  meloxicam (MOBIC) 7.5 MG tablet, Take 1 tablet (7.5 mg total) by mouth daily.  Current Outpatient Medications (Hematological):  Marland Kitchen  FERREX 150 150 MG capsule, Take 1 capsule by mouth once daily .  vitamin B-12 (CYANOCOBALAMIN) 1000 MCG tablet, Take 1 tablet (1,000 mcg total) by mouth daily.  Current Outpatient Medications (Other):  .  azelastine (OPTIVAR) 0.05 % ophthalmic solution, Place 1 drop into both eyes 2 (two) times daily. .  Blood Glucose Monitoring Suppl (ONETOUCH VERIO) w/Device KIT, Use as directed daily E11.9 .  Continuous Blood Gluc Receiver (FREESTYLE LIBRE READER) DEVI, Apply 1 Device topically every 14 (fourteen) days. E11.9 .  Continuous Blood Gluc Sensor (FREESTYLE LIBRE 14 DAY SENSOR) MISC, Apply 1 Device topically daily. E11.9 .  Diclofenac Sodium 2 % SOLN, Place 2 g onto the skin 2 (two) times daily. Marland Kitchen  glucose blood (ONETOUCH VERIO) test strip, Use as instructed daily E11.9 .  hydrocortisone  (ANUSOL-HC) 2.5 % rectal cream, Place 1 application rectally 2 (two) times daily. .  hydrOXYzine (ATARAX/VISTARIL) 10 MG tablet, Take 1 tablet (10 mg total) by mouth 3 (three) times daily as needed. .  Lancets MISC, Use as directed daily E11.9 .  omeprazole (PRILOSEC) 40 MG capsule, Take 1 capsule (40 mg total) by mouth daily. Marland Kitchen  rOPINIRole (REQUIP) 1 MG tablet, Take 1 tablet (1 mg total) by mouth at bedtime.   Reviewed prior external information including notes and imaging from  primary care provider As well as notes that were available from care everywhere and other healthcare systems.  Past medical history,  social, surgical and family history all reviewed in electronic medical record.  No pertanent information unless stated regarding to the chief complaint.   Review of Systems:  No headache, visual changes, nausea, vomiting, diarrhea, constipation, dizziness, abdominal pain, skin rash, fevers, chills, night sweats, weight loss, swollen lymph nodes, body aches, joint swelling, chest pain, shortness of breath, mood changes. POSITIVE muscle aches  Objective  Blood pressure (!) 150/90, pulse 91, height 5' (1.524 m), weight 117 lb (53.1 kg), SpO2 98 %.   General: No apparent distress alert and oriented x3 mood and affect normal, dressed appropriately.  HEENT: Pupils equal, extraocular movements intact  Respiratory: Patient's speak in full sentences and does not appear short of breath  Cardiovascular: No lower extremity edema, non tender, no erythema  Neuro: Cranial nerves II through XII are intact, neurovascularly intact in all extremities with 2+ DTRs and 2+ pulses.  Gait normal with good balance and coordination.  MSK: Mild arthritic changes Neck exam shows the patient has significant loss of lordosis.  Patient restricted in all range of motion's at the moment especially to sidebending bilaterally.  Still some mild radicular symptoms that seems to go down the left arm with Spurling's.   Patient shoulder girdle musculature seems to be minorly weaker than what would be anticipated.  Good grip strength though otherwise deep tendon reflexes intact   Impression and Recommendations:     The above documentation has been reviewed and is accurate and complete Lyndal Pulley, DO       Note: This dictation was prepared with Dragon dictation along with smaller phrase technology. Any transcriptional errors that result from this process are unintentional.

## 2020-06-05 ENCOUNTER — Other Ambulatory Visit: Payer: Self-pay

## 2020-06-05 ENCOUNTER — Encounter: Payer: Self-pay | Admitting: Family Medicine

## 2020-06-05 ENCOUNTER — Ambulatory Visit (INDEPENDENT_AMBULATORY_CARE_PROVIDER_SITE_OTHER): Payer: BC Managed Care – PPO | Admitting: Family Medicine

## 2020-06-05 VITALS — BP 150/90 | HR 91 | Ht 60.0 in | Wt 117.0 lb

## 2020-06-05 DIAGNOSIS — E119 Type 2 diabetes mellitus without complications: Secondary | ICD-10-CM | POA: Diagnosis not present

## 2020-06-05 DIAGNOSIS — M5412 Radiculopathy, cervical region: Secondary | ICD-10-CM

## 2020-06-05 DIAGNOSIS — M2550 Pain in unspecified joint: Secondary | ICD-10-CM

## 2020-06-05 NOTE — Assessment & Plan Note (Signed)
Patient has had iron deficiency previously.  I would like to recheck iron with patient having becoming more dizziness and symptomatic.  Some of it could be secondary to her blood sugars.  We discussed the possibility of seeing cardiology if this continues.  Given red flags such as shortness of breath or chest pain to seek medical attention immediately.  Laboratory work-up to check iron, B12 and electrolytes ordered today.

## 2020-06-05 NOTE — Patient Instructions (Addendum)
Dr. Yevette Edwards for neck pain Labs today If any chest pain, please go to ER Write me in a week about how you are doing if still dizzy See me again in 2 months

## 2020-06-05 NOTE — Assessment & Plan Note (Signed)
Patient has had bilateral cervical radiculopathy.  No longer responding to the conservative therapy.  Has had 2 epidurals in the cervical spine that has been short-lived with weeks of at least 85 to 90% improvement and then start to slowly worsen again.  Patient feels like quality of life is decreasing.  Hold on any type of manipulation.  Continue same medications, do not know if continuing epidurals will be beneficial with patient's fragility and her blood sugars.  Will refer patient to orthopedic surgery to discuss the possibility of surgical intervention for the neck at this stage.  Patient did respond extremely well to a rotator cuff repair.

## 2020-06-06 ENCOUNTER — Encounter: Payer: Self-pay | Admitting: Adult Health

## 2020-06-06 ENCOUNTER — Telehealth (INDEPENDENT_AMBULATORY_CARE_PROVIDER_SITE_OTHER): Payer: BC Managed Care – PPO | Admitting: Adult Health

## 2020-06-06 VITALS — Wt 117.0 lb

## 2020-06-06 DIAGNOSIS — R110 Nausea: Secondary | ICD-10-CM

## 2020-06-06 DIAGNOSIS — R251 Tremor, unspecified: Secondary | ICD-10-CM | POA: Diagnosis not present

## 2020-06-06 DIAGNOSIS — R42 Dizziness and giddiness: Secondary | ICD-10-CM

## 2020-06-06 LAB — TSH: TSH: 1.13 mIU/L (ref 0.40–4.50)

## 2020-06-06 LAB — COMPREHENSIVE METABOLIC PANEL
AG Ratio: 1.8 (calc) (ref 1.0–2.5)
ALT: 12 U/L (ref 6–29)
AST: 13 U/L (ref 10–35)
Albumin: 4.2 g/dL (ref 3.6–5.1)
Alkaline phosphatase (APISO): 77 U/L (ref 37–153)
BUN: 13 mg/dL (ref 7–25)
CO2: 27 mmol/L (ref 20–32)
Calcium: 9.7 mg/dL (ref 8.6–10.4)
Chloride: 102 mmol/L (ref 98–110)
Creat: 0.65 mg/dL (ref 0.50–0.99)
Globulin: 2.4 g/dL (calc) (ref 1.9–3.7)
Glucose, Bld: 395 mg/dL — ABNORMAL HIGH (ref 65–99)
Potassium: 4.1 mmol/L (ref 3.5–5.3)
Sodium: 140 mmol/L (ref 135–146)
Total Bilirubin: 0.4 mg/dL (ref 0.2–1.2)
Total Protein: 6.6 g/dL (ref 6.1–8.1)

## 2020-06-06 LAB — CBC WITH DIFFERENTIAL/PLATELET
Absolute Monocytes: 753 cells/uL (ref 200–950)
Basophils Absolute: 138 cells/uL (ref 0–200)
Basophils Relative: 1.7 %
Eosinophils Absolute: 381 cells/uL (ref 15–500)
Eosinophils Relative: 4.7 %
HCT: 37.1 % (ref 35.0–45.0)
Hemoglobin: 11.8 g/dL (ref 11.7–15.5)
Lymphs Abs: 1976 cells/uL (ref 850–3900)
MCH: 26.3 pg — ABNORMAL LOW (ref 27.0–33.0)
MCHC: 31.8 g/dL — ABNORMAL LOW (ref 32.0–36.0)
MCV: 82.8 fL (ref 80.0–100.0)
MPV: 9.8 fL (ref 7.5–12.5)
Monocytes Relative: 9.3 %
Neutro Abs: 4852 cells/uL (ref 1500–7800)
Neutrophils Relative %: 59.9 %
Platelets: 521 10*3/uL — ABNORMAL HIGH (ref 140–400)
RBC: 4.48 10*6/uL (ref 3.80–5.10)
RDW: 14 % (ref 11.0–15.0)
Total Lymphocyte: 24.4 %
WBC: 8.1 10*3/uL (ref 3.8–10.8)

## 2020-06-06 LAB — VITAMIN B12: Vitamin B-12: 682 pg/mL (ref 200–1100)

## 2020-06-06 LAB — EXTRA BLUE TOP TUBE

## 2020-06-06 LAB — PTH, INTACT AND CALCIUM
Calcium: 9.7 mg/dL (ref 8.6–10.4)
PTH: 34 pg/mL (ref 14–64)

## 2020-06-06 LAB — TRANSFERRIN: Transferrin: 321 mg/dL (ref 188–341)

## 2020-06-06 LAB — FERRITIN: Ferritin: 22 ng/mL (ref 16–288)

## 2020-06-06 NOTE — Progress Notes (Signed)
Virtual Visit via Video Note  I connected with Ruth Crawford on 06/06/20 at  4:30 PM EDT by a video enabled telemedicine application and verified that I am speaking with the correct person using two identifiers.  Location patient: home Location provider:work or home office Persons participating in the virtual visit: patient, provider  I discussed the limitations of evaluation and management by telemedicine and the availability of in person appointments. The patient expressed understanding and agreed to proceed.   HPI: 64 year old female who is a patient of Dr. Cathlean Cower, who I am seeing today first time.  She reports that over the last week she has been experiencing "shaking, generalized abdominal pain, lightheadedness,  and nausea".  Her symptoms are worse when she is hungry and nearly resolved after she eats something.  She denies fevers, chills, diarrhea, constipation, chest pain,shortness of breath, or vomiting.   She does have a history of diabetes mellitus and was placed recently on Actos 15 mg for elevated blood sugar readings after steroid epidural.  He was seen by sports medicine yesterday at which time labs were done including CBC, CMP, B12, and iron panel.  All of her labs were normal except for glucose which was 395.   She reports that 20 minutes prior to this appointment her blood sugar was 160.  She was feeling shaky soon after checking her blood sugars and started having a sandwich at which time her shakiness nearly resolved and she was no longer having any abdominal pain or nausea.   ROS: See pertinent positives and negatives per HPI.  Past Medical History:  Diagnosis Date   Allergy    ANXIETY    COMMON MIGRAINE    DEPRESSION    DIABETES MELLITUS, TYPE II    GERD (gastroesophageal reflux disease) 10/17/2012   HYPERLIPIDEMIA    HYPERTENSION    IDA (iron deficiency anemia)    POLYP, GALLBLADDER    RLS (restless legs syndrome)    Sickle cell trait ([place])      Past Surgical History:  Procedure Laterality Date   ABDOMINAL HYSTERECTOMY  2000   COLONOSCOPY     LUMBAR [place] SURGERY  1998, 2016, 2017   s/p    Family History  Problem Relation Age of Onset   Diabetes Mother    Liver cancer Mother    Arthritis Mother    Alcohol abuse Mother    Heart disease Father    Heart attack Father    Stroke Father    Diabetes Brother    Colon cancer Neg Hx    Esophageal cancer Neg Hx    Rectal cancer Neg Hx    Stomach cancer Neg Hx        Current Outpatient Medications:    Aspirin-Acetaminophen-Caffeine (EXCEDRIN MIGRAINE PO), Take by mouth as needed., Disp: , Rfl:    azelastine (OPTIVAR) 0.05 % ophthalmic solution, Place 1 drop into both eyes 2 (two) times daily., Disp: 6 mL, Rfl: 12   Blood Glucose Monitoring Suppl (ONETOUCH VERIO) w/Device KIT, Use as directed daily E11.9, Disp: 1 kit, Rfl: 0   Continuous Blood Gluc Receiver (FREESTYLE LIBRE READER) DEVI, Apply 1 Device topically every 14 (fourteen) days. E11.9, Disp: 6 each, Rfl: 3   Continuous Blood Gluc Sensor (FREESTYLE LIBRE 14 DAY SENSOR) MISC, Apply 1 Device topically daily. E11.9, Disp: 1 each, Rfl: 0   Diclofenac Sodium 2 % SOLN, Place 2 g onto the skin 2 (two) times daily., Disp: 112 g, Rfl: 3   diphenhydrAMINE (BENADRYL) 25  MG tablet, Take 25 mg by mouth daily as needed., Disp: , Rfl:    Exenatide ER (BYDUREON) 2 MG PEN, Inject 2 mg into the skin once a week., Disp: 12 each, Rfl: 3   FERREX 150 150 MG capsule, Take 1 capsule by mouth once daily, Disp: 90 capsule, Rfl: 1   glipiZIDE (GLUCOTROL XL) 10 MG 24 hr tablet, Take 1 tablet by mouth once daily with breakfast, Disp: 90 tablet, Rfl: 2   glucose blood (ONETOUCH VERIO) test strip, Use as instructed daily E11.9, Disp: 100 each, Rfl: 12   hydrocortisone (ANUSOL-HC) 2.5 % rectal cream, Place 1 application rectally 2 (two) times daily., Disp: 30 g, Rfl: 0   hydrOXYzine (ATARAX/VISTARIL) 10 MG tablet, Take 1  tablet (10 mg total) by mouth 3 (three) times daily as needed., Disp: 30 tablet, Rfl: 0   Lancets MISC, Use as directed daily E11.9, Disp: 100 each, Rfl: 0   lisinopril (ZESTRIL) 20 MG tablet, Take 1 tablet by mouth once daily, Disp: 90 tablet, Rfl: 3   lovastatin (MEVACOR) 40 MG tablet, Take 1 tablet by mouth once daily, Disp: 90 tablet, Rfl: 3   meloxicam (MOBIC) 7.5 MG tablet, Take 1 tablet (7.5 mg total) by mouth daily., Disp: 90 tablet, Rfl: 1   metFORMIN (GLUCOPHAGE) 1000 MG tablet, TAKE 1 TABLET BY MOUTH TWICE DAILY WITH MEALS, Disp: 180 tablet, Rfl: 1   omeprazole (PRILOSEC) 40 MG capsule, Take 1 capsule (40 mg total) by mouth daily., Disp: 90 capsule, Rfl: 3   pioglitazone (ACTOS) 15 MG tablet, Take 1 tablet (15 mg total) by mouth daily., Disp: 90 tablet, Rfl: 3   rOPINIRole (REQUIP) 1 MG tablet, Take 1 tablet (1 mg total) by mouth at bedtime., Disp: 90 tablet, Rfl: 3   vitamin B-12 (CYANOCOBALAMIN) 1000 MCG tablet, Take 1 tablet (1,000 mcg total) by mouth daily., Disp: 90 tablet, Rfl: 3  EXAM:  VITALS per patient if applicable:  GENERAL: alert, oriented, appears well and in no acute distress  HEENT: atraumatic, conjunttiva clear, no obvious abnormalities on inspection of external nose and ears  NECK: normal movements of the head and neck  LUNGS: on inspection no signs of respiratory distress, breathing rate appears normal, no obvious gross SOB, gasping or wheezing  CV: no obvious cyanosis  MS: moves all visible extremities without noticeable abnormality  PSYCH/NEURO: pleasant and cooperative, no obvious depression or anxiety, speech and thought processing grossly intact  ASSESSMENT AND PLAN:  Discussed the following assessment and plan:  -Symptoms may be due to sudden drops in blood sugar due to the addition of Actos?Starr Sinclair have her hold her Actos for the next 24 hours and see how she responds.  She will follow-up with me via MyChart on Thursday    I  discussed the assessment and treatment plan with the patient. The patient was provided an opportunity to ask questions and all were answered. The patient agreed with the plan and demonstrated an understanding of the instructions.   The patient was advised to call back or seek an in-person evaluation if the symptoms worsen or if the condition fails to improve as anticipated.   Dorothyann Peng, NP

## 2020-06-08 ENCOUNTER — Encounter: Payer: Self-pay | Admitting: Internal Medicine

## 2020-06-09 ENCOUNTER — Encounter: Payer: Self-pay | Admitting: Adult Health

## 2020-06-13 ENCOUNTER — Encounter: Payer: Self-pay | Admitting: Family Medicine

## 2020-06-21 ENCOUNTER — Ambulatory Visit (INDEPENDENT_AMBULATORY_CARE_PROVIDER_SITE_OTHER): Payer: BC Managed Care – PPO | Admitting: Internal Medicine

## 2020-06-21 ENCOUNTER — Other Ambulatory Visit: Payer: Self-pay

## 2020-06-21 ENCOUNTER — Encounter: Payer: Self-pay | Admitting: Internal Medicine

## 2020-06-21 VITALS — BP 130/82 | HR 82 | Temp 99.6°F | Ht 60.0 in | Wt 117.0 lb

## 2020-06-21 DIAGNOSIS — K219 Gastro-esophageal reflux disease without esophagitis: Secondary | ICD-10-CM | POA: Diagnosis not present

## 2020-06-21 DIAGNOSIS — Z23 Encounter for immunization: Secondary | ICD-10-CM | POA: Diagnosis not present

## 2020-06-21 DIAGNOSIS — R198 Other specified symptoms and signs involving the digestive system and abdomen: Secondary | ICD-10-CM

## 2020-06-21 DIAGNOSIS — E119 Type 2 diabetes mellitus without complications: Secondary | ICD-10-CM

## 2020-06-21 DIAGNOSIS — I10 Essential (primary) hypertension: Secondary | ICD-10-CM

## 2020-06-21 LAB — POCT GLYCOSYLATED HEMOGLOBIN (HGB A1C): Hemoglobin A1C: 8.8 % — AB (ref 4.0–5.6)

## 2020-06-21 NOTE — Patient Instructions (Addendum)
You had the flu shot today, and the Tdap tetanus shot  Your A1c was OK today  Please continue all other medications as before, and refills have been done if requested.  Please have the pharmacy call with any other refills you may need.  Please continue your efforts at being more active, low cholesterol diet, and weight control.  Please keep your appointments with your specialists as you may have planned  Please make an Appointment to return in 6 months, or sooner if needed

## 2020-06-21 NOTE — Progress Notes (Signed)
Subjective:    Patient ID: Ruth Crawford, female    DOB: 1955/10/24, 64 y.o.   MRN: [id number]  HPI  Here to f/u; overall doing ok,  Pt denies chest pain, increasing sob or doe, wheezing, orthopnea, PND, increased LE swelling, palpitations, dizziness or syncope.  Pt denies new neurological symptoms such as new headache, or facial or extremity weakness or numbness.  Pt denies polydipsia, polyuria, or low sugar episode.  Pt states overall good compliance with meds, mostly trying to follow appropriate diet, with wt overall stable,   Wt Readings from Last 3 Encounters:  06/21/20 117 lb (53.1 kg)  06/06/20 117 lb (53.1 kg)  06/05/20 117 lb (53.1 kg)  Sept 3 + for covid (twice) and not able to go on the cruise. Husband never tested +. May have gotten from grandson about 10 days prior when visited in [place].  S/p quarnatine for 14 days, took all the vitamins, did not get worse.  S/p vaccination April 2021.   Past Medical History:  Diagnosis Date  . Allergy   . ANXIETY   . COMMON MIGRAINE   . DEPRESSION   . DIABETES MELLITUS, TYPE II   . GERD (gastroesophageal reflux disease) 10/17/2012  . HYPERLIPIDEMIA   . HYPERTENSION   . IDA (iron deficiency anemia)   . POLYP, GALLBLADDER   . RLS (restless legs syndrome)   . Sickle cell trait [facility])    Past Surgical History:  Procedure Laterality Date  . ABDOMINAL HYSTERECTOMY  2000  . COLONOSCOPY    . [place], 2016, 2017   s/p    reports that she has never smoked. She has never used smokeless tobacco. She reports current alcohol use. She reports that she does not use drugs. family history includes Alcohol abuse in her mother; Arthritis in her mother; Diabetes in her brother and mother; Heart attack in her father; Heart disease in her father; Liver cancer in her mother; Stroke in her father. Allergies  Allergen Reactions  . Actos [Pioglitazone]     dizziness  . Demerol [Meperidine] Other (See Comments)    Per pt: unknown  . Prednisone  Itching and Rash   Current Outpatient Medications on File Prior to Visit  Medication Sig Dispense Refill  . Aspirin-Acetaminophen-Caffeine (EXCEDRIN MIGRAINE PO) Take by mouth as needed.    Marland Kitchen azelastine (OPTIVAR) 0.05 % ophthalmic solution Place 1 drop into both eyes 2 (two) times daily. 6 mL 12  . Blood Glucose Monitoring Suppl (ONETOUCH VERIO) w/Device KIT Use as directed daily E11.9 1 kit 0  . Continuous Blood Gluc Receiver (FREESTYLE LIBRE READER) DEVI Apply 1 Device topically every 14 (fourteen) days. E11.9 6 each 3  . Continuous Blood Gluc Sensor (FREESTYLE LIBRE 14 DAY SENSOR) MISC Apply 1 Device topically daily. E11.9 1 each 0  . Diclofenac Sodium 2 % SOLN Place 2 g onto the skin 2 (two) times daily. 112 g 3  . diphenhydrAMINE (BENADRYL) 25 MG tablet Take 25 mg by mouth daily as needed.    . Exenatide ER (BYDUREON) 2 MG PEN Inject 2 mg into the skin once a week. 12 each 3  . FERREX 150 150 MG capsule Take 1 capsule by mouth once daily 90 capsule 1  . FLUoxetine (PROZAC) 20 MG capsule fluoxetine 20 mg capsule    . glipiZIDE (GLUCOTROL XL) 10 MG 24 hr tablet Take 1 tablet by mouth once daily with breakfast 90 tablet 2  . glucose blood (ONETOUCH VERIO) test  strip Use as instructed daily E11.9 100 each 12  . hydrocortisone (ANUSOL-HC) 2.5 % rectal cream Place 1 application rectally 2 (two) times daily. 30 g 0  . hydrOXYzine (ATARAX/VISTARIL) 10 MG tablet Take 1 tablet (10 mg total) by mouth 3 (three) times daily as needed. 30 tablet 0  . Lancets MISC Use as directed daily E11.9 100 each 0  . lisinopril (ZESTRIL) 20 MG tablet Take 1 tablet by mouth once daily 90 tablet 3  . lovastatin (MEVACOR) 40 MG tablet Take 1 tablet by mouth once daily 90 tablet 3  . meloxicam (MOBIC) 7.5 MG tablet Take 1 tablet (7.5 mg total) by mouth daily. 90 tablet 1  . metFORMIN (GLUCOPHAGE) 1000 MG tablet TAKE 1 TABLET BY MOUTH TWICE DAILY WITH MEALS 180 tablet 1  . omeprazole (PRILOSEC) 40 MG capsule Take 1  capsule (40 mg total) by mouth daily. 90 capsule 3  . pioglitazone (ACTOS) 15 MG tablet Take 1 tablet (15 mg total) by mouth daily. 90 tablet 3  . rOPINIRole (REQUIP) 1 MG tablet Take 1 tablet (1 mg total) by mouth at bedtime. 90 tablet 3  . vitamin B-12 (CYANOCOBALAMIN) 1000 MCG tablet Take 1 tablet (1,000 mcg total) by mouth daily. 90 tablet 3   No current facility-administered medications on file prior to visit.   Review of Systems All otherwise neg per pt    Objective:   Physical Exam BP 130/82 (BP Location: Left Arm, Patient Position: Sitting, Cuff Size: Large)   Pulse 82   Temp 99.6 F (37.6 C) (Oral)   Ht 5' (1.524 m)   Wt 117 lb (53.1 kg)   SpO2 99%   BMI 22.85 kg/m  VS noted,  Constitutional: Pt appears in NAD HENT: Head: NCAT.  Right Ear: External ear normal.  Left Ear: External ear normal.  Eyes: . Pupils are equal, round, and reactive to light. Conjunctivae and EOM are normal Nose: without d/c or deformity Neck: Neck supple. Gross normal ROM Cardiovascular: Normal rate and regular rhythm.   Pulmonary/Chest: Effort normal and breath sounds without rales or wheezing.  Abd:  Soft, NT, ND, + BS, no organomegaly Neurological: Pt is alert. At baseline orientation, motor grossly intact Skin: Skin is warm. No rashes, other new lesions, no LE edema Psychiatric: Pt behavior is normal without agitation  All otherwise neg per pt Lab Results  Component Value Date   WBC 8.1 06/05/2020   HGB 11.8 06/05/2020   HCT 37.1 06/05/2020   PLT 521 (H) 06/05/2020   GLUCOSE 395 (H) 06/05/2020   CHOL 126 10/25/2019   TRIG 75.0 10/25/2019   HDL 56.80 10/25/2019   LDLCALC 54 10/25/2019   ALT 12 06/05/2020   AST 13 06/05/2020   NA 140 06/05/2020   K 4.1 06/05/2020   CL 102 06/05/2020   CREATININE 0.65 06/05/2020   BUN 13 06/05/2020   CO2 27 06/05/2020   TSH 1.13 06/05/2020   INR 1.0 08/26/2018   HGBA1C 8.8 (A) 06/21/2020   MICROALBUR 1.8 10/25/2019   Contains abnormal  dataPOCT HgB A1C Order: [id number] Status:  Final result Visible to patient:  Yes (not seen) Dx:  Type 2 diabetes mellitus without comp...  0 Result Notes   1 HM Topic  Ref Range & Units 1 d ago  (06/21/20) 8 mo ago  (10/25/19) 1 yr ago  (04/19/19) 1 yr ago  (07/01/18) 3 yr ago  (04/08/17) 3 yr ago  (08/30/16) 4 yr ago  (02/28/16)  Hemoglobin A1C 4.0 -  5.6 % 8.8Abnormal  7.6High R, CM  7.9High R, CM  8.3High R, CM  8.2High R, CM  7.2High R, CM  6.9           Assessment & Plan:

## 2020-06-23 ENCOUNTER — Telehealth: Payer: Self-pay | Admitting: Internal Medicine

## 2020-06-23 NOTE — Telephone Encounter (Signed)
Please contact pt -   On reviewing her meds, it appears her med list includes bydureon 2 mg, glucotrol 10 qd, metformin 1000 bid, and actos 15 mg  Please verify pt is taking these meds, as I see also for some reason actos is on her allergy list  Her a1c is too high, and we have to figure out how to improve her control

## 2020-06-25 ENCOUNTER — Encounter: Payer: Self-pay | Admitting: Internal Medicine

## 2020-06-25 NOTE — Assessment & Plan Note (Signed)
stable overall by history and exam, recent data reviewed with pt, and pt to continue medical treatment as before,  to f/u any worsening symptoms or concerns  

## 2020-06-25 NOTE — Assessment & Plan Note (Signed)
I suspect post covid benign symptoms,  to f/u any worsening symptoms or concerns

## 2020-06-25 NOTE — Assessment & Plan Note (Addendum)
stable overall by history and exam, recent data reviewed with pt, and pt to continue medical treatment as before,  to f/u any worsening symptoms or concerns, for a1c today  I spent 31 minutes in preparing to see the patient by review of recent labs, imaging and procedures, obtaining and reviewing separately obtained history, communicating with the patient and family or caregiver, ordering medications, tests or procedures, and documenting clinical information in the EHR including the differential Dx, treatment, and any further evaluation and other management of dm, htn, hld, gI symptoms

## 2020-06-28 NOTE — Telephone Encounter (Signed)
Was able to speak with pt today after trying to reach pt over several days.  **Was able to inform Dr. Jonny Ruiz that pt has stated she is only taking Glipizide 10mg  1x a day and Metformin 1000mg  2x a day.  **Pt has stated she is in fact allergic to ACTOS and is no longer taking it.

## 2020-06-29 MED ORDER — CANAGLIFLOZIN 300 MG PO TABS
300.0000 mg | ORAL_TABLET | Freq: Every day | ORAL | 3 refills | Status: DC
Start: 2020-06-29 — End: 2020-10-06

## 2020-06-29 NOTE — Addendum Note (Signed)
Addended by: Corwin Levins on: 06/29/2020 08:47 AM   Modules accepted: Orders

## 2020-06-29 NOTE — Telephone Encounter (Signed)
Oh good, that's where my confusion was, since the actos was on the med list, but also in the allergy list  I am also assuming she is NOT taking the Bydureon as this is also on her med list but from 2019  Her recent a1c was 8.8, which is moderately elevated, with the goal being to be less than 7  She is normally compliant with her 2 oral meds and diet, so in addition to the meds she is taking, we need to try to add invokana 300 mg which appaers to be covered by her insurance that I can tell  Ok for staff to let pt know please -

## 2020-07-31 LAB — HM DIABETES EYE EXAM

## 2020-08-08 NOTE — Progress Notes (Deleted)
[place] Phone: [phone] Subjective:    I'm seeing this patient by the request  of:  Biagio Borg, MD  CC:   [id number]   06/05/2020 Patient has had iron deficiency previously.  I would like to recheck iron with patient having becoming more dizziness and symptomatic.  Some of it could be secondary to her blood sugars.  We discussed the possibility of seeing cardiology if this continues.  Given red flags such as shortness of breath or chest pain to seek medical attention immediately.  Laboratory work-up to check iron, B12 and electrolytes ordered today.   Update 08/14/2020 LOWANA HABLE is a 64 y.o. female coming in with complaint of neck pain. Patient referred to Dr. Lynann Bologna. Patient states        Past Medical History:  Diagnosis Date  . Allergy   . ANXIETY   . COMMON MIGRAINE   . DEPRESSION   . DIABETES MELLITUS, TYPE II   . GERD (gastroesophageal reflux disease) 10/17/2012  . HYPERLIPIDEMIA   . HYPERTENSION   . IDA (iron deficiency anemia)   . POLYP, GALLBLADDER   . RLS (restless legs syndrome)   . Sickle cell trait [facility])    Past Surgical History:  Procedure Laterality Date  . ABDOMINAL HYSTERECTOMY  2000  . COLONOSCOPY    . [place], 2016, 2017   s/p   Social History   Socioeconomic History  . Marital status: Married    Spouse name: Not on file  . Number of children: 1  . Years of education: Not on file  . Highest education level: Not on file  Occupational History  . Occupation: billing clerk    Comment: at [employer]  Tobacco Use  . Smoking status: Never Smoker  . Smokeless tobacco: Never Used  Substance and Sexual Activity  . Alcohol use: Yes    Comment: occasional use  . Drug use: No  . Sexual activity: Not on file  Other Topics Concern  . Not on file  Social History Narrative  . Not on file   Social Determinants of Health   Financial  Resource Strain:   . Difficulty of Paying Living Expenses: Not on file  Food Insecurity:   . Worried About Charity fundraiser in the Last Year: Not on file  . Ran Out of Food in the Last Year: Not on file  Transportation Needs:   . Lack of Transportation (Medical): Not on file  . Lack of Transportation (Non-Medical): Not on file  Physical Activity:   . Days of Exercise per Week: Not on file  . Minutes of Exercise per Session: Not on file  Stress:   . Feeling of Stress : Not on file  Social Connections:   . Frequency of Communication with Friends and Family: Not on file  . Frequency of Social Gatherings with Friends and Family: Not on file  . Attends Religious Services: Not on file  . Active Member of Clubs or Organizations: Not on file  . Attends Archivist Meetings: Not on file  . Marital Status: Not on file   Allergies  Allergen Reactions  . Actos [Pioglitazone]     dizziness  . Demerol [Meperidine] Other (See Comments)    Per pt: unknown  . Prednisone Itching and Rash   Family History  Problem Relation Age of Onset  . Diabetes Mother   . Liver cancer Mother   .  Arthritis Mother   . Alcohol abuse Mother   . Heart disease Father   . Heart attack Father   . Stroke Father   . Diabetes Brother   . Colon cancer Neg Hx   . Esophageal cancer Neg Hx   . Rectal cancer Neg Hx   . Stomach cancer Neg Hx     Current Outpatient Medications (Endocrine & Metabolic):  .  canagliflozin (INVOKANA) 300 MG TABS tablet, Take 1 tablet (300 mg total) by mouth daily before breakfast. .  glipiZIDE (GLUCOTROL XL) 10 MG 24 hr tablet, Take 1 tablet by mouth once daily with breakfast .  metFORMIN (GLUCOPHAGE) 1000 MG tablet, TAKE 1 TABLET BY MOUTH TWICE DAILY WITH MEALS  Current Outpatient Medications (Cardiovascular):  .  lisinopril (ZESTRIL) 20 MG tablet, Take 1 tablet by mouth once daily .  lovastatin (MEVACOR) 40 MG tablet, Take 1 tablet by mouth once daily  Current  Outpatient Medications (Respiratory):  .  diphenhydrAMINE (BENADRYL) 25 MG tablet, Take 25 mg by mouth daily as needed.  Current Outpatient Medications (Analgesics):  Marland Kitchen  Aspirin-Acetaminophen-Caffeine (EXCEDRIN MIGRAINE PO), Take by mouth as needed. .  meloxicam (MOBIC) 7.5 MG tablet, Take 1 tablet (7.5 mg total) by mouth daily.  Current Outpatient Medications (Hematological):  Marland Kitchen  FERREX 150 150 MG capsule, Take 1 capsule by mouth once daily .  vitamin B-12 (CYANOCOBALAMIN) 1000 MCG tablet, Take 1 tablet (1,000 mcg total) by mouth daily.  Current Outpatient Medications (Other):  .  azelastine (OPTIVAR) 0.05 % ophthalmic solution, Place 1 drop into both eyes 2 (two) times daily. .  Blood Glucose Monitoring Suppl (ONETOUCH VERIO) w/Device KIT, Use as directed daily E11.9 .  Continuous Blood Gluc Receiver (FREESTYLE LIBRE READER) DEVI, Apply 1 Device topically every 14 (fourteen) days. E11.9 .  Continuous Blood Gluc Sensor (FREESTYLE LIBRE 14 DAY SENSOR) MISC, Apply 1 Device topically daily. E11.9 .  Diclofenac Sodium 2 % SOLN, Place 2 g onto the skin 2 (two) times daily. Marland Kitchen  FLUoxetine (PROZAC) 20 MG capsule, fluoxetine 20 mg capsule .  glucose blood (ONETOUCH VERIO) test strip, Use as instructed daily E11.9 .  hydrocortisone (ANUSOL-HC) 2.5 % rectal cream, Place 1 application rectally 2 (two) times daily. .  hydrOXYzine (ATARAX/VISTARIL) 10 MG tablet, Take 1 tablet (10 mg total) by mouth 3 (three) times daily as needed. .  Lancets MISC, Use as directed daily E11.9 .  omeprazole (PRILOSEC) 40 MG capsule, Take 1 capsule (40 mg total) by mouth daily. Marland Kitchen  rOPINIRole (REQUIP) 1 MG tablet, Take 1 tablet (1 mg total) by mouth at bedtime.   Reviewed prior external information including notes and imaging from  primary care provider As well as notes that were available from care everywhere and other healthcare systems.  Past medical history, social, surgical and family history all reviewed in  electronic medical record.  No pertanent information unless stated regarding to the chief complaint.   Review of Systems:  No headache, visual changes, nausea, vomiting, diarrhea, constipation, dizziness, abdominal pain, skin rash, fevers, chills, night sweats, weight loss, swollen lymph nodes, body aches, joint swelling, chest pain, shortness of breath, mood changes. POSITIVE muscle aches  Objective  There were no vitals taken for this visit.   General: No apparent distress alert and oriented x3 mood and affect normal, dressed appropriately.  HEENT: Pupils equal, extraocular movements intact  Respiratory: Patient's speak in full sentences and does not appear short of breath  Cardiovascular: No lower extremity edema, non tender, no  erythema  Neuro: Cranial nerves II through XII are intact, neurovascularly intact in all extremities with 2+ DTRs and 2+ pulses.  Gait normal with good balance and coordination.  MSK:  Non tender with full range of motion and good stability and symmetric strength and tone of shoulders, elbows, wrist, hip, knee and ankles bilaterally.     Impression and Recommendations:     The above documentation has been reviewed and is accurate and complete Jacqualin Combes

## 2020-08-09 ENCOUNTER — Encounter: Payer: Self-pay | Admitting: Family Medicine

## 2020-08-14 ENCOUNTER — Ambulatory Visit: Payer: BC Managed Care – PPO | Admitting: Family Medicine

## 2020-08-14 ENCOUNTER — Encounter: Payer: Self-pay | Admitting: Internal Medicine

## 2020-09-04 ENCOUNTER — Encounter: Payer: Self-pay | Admitting: Family Medicine

## 2020-09-05 ENCOUNTER — Encounter: Payer: Self-pay | Admitting: Internal Medicine

## 2020-09-05 ENCOUNTER — Encounter: Payer: Self-pay | Admitting: Family Medicine

## 2020-09-06 ENCOUNTER — Encounter: Payer: Self-pay | Admitting: Family Medicine

## 2020-09-28 ENCOUNTER — Ambulatory Visit (INDEPENDENT_AMBULATORY_CARE_PROVIDER_SITE_OTHER): Payer: BC Managed Care – PPO | Admitting: Internal Medicine

## 2020-09-28 ENCOUNTER — Telehealth: Payer: Self-pay

## 2020-09-28 ENCOUNTER — Other Ambulatory Visit: Payer: Self-pay

## 2020-09-28 ENCOUNTER — Encounter: Payer: Self-pay | Admitting: Internal Medicine

## 2020-09-28 ENCOUNTER — Other Ambulatory Visit: Payer: BC Managed Care – PPO

## 2020-09-28 VITALS — BP 138/82 | HR 92 | Temp 98.0°F | Ht 60.0 in | Wt 119.2 lb

## 2020-09-28 DIAGNOSIS — E119 Type 2 diabetes mellitus without complications: Secondary | ICD-10-CM

## 2020-09-28 DIAGNOSIS — M25512 Pain in left shoulder: Secondary | ICD-10-CM

## 2020-09-28 DIAGNOSIS — Z0000 Encounter for general adult medical examination without abnormal findings: Secondary | ICD-10-CM

## 2020-09-28 DIAGNOSIS — I10 Essential (primary) hypertension: Secondary | ICD-10-CM

## 2020-09-28 DIAGNOSIS — E559 Vitamin D deficiency, unspecified: Secondary | ICD-10-CM | POA: Diagnosis not present

## 2020-09-28 DIAGNOSIS — F32A Depression, unspecified: Secondary | ICD-10-CM | POA: Diagnosis not present

## 2020-09-28 DIAGNOSIS — Z0001 Encounter for general adult medical examination with abnormal findings: Secondary | ICD-10-CM

## 2020-09-28 HISTORY — DX: Pain in left shoulder: M25.512

## 2020-09-28 LAB — URINALYSIS, ROUTINE W REFLEX MICROSCOPIC
Bilirubin Urine: NEGATIVE
Hgb urine dipstick: NEGATIVE
Ketones, ur: NEGATIVE
Leukocytes,Ua: NEGATIVE
Nitrite: NEGATIVE
RBC / HPF: NONE SEEN (ref 0–?)
Specific Gravity, Urine: 1.01 (ref 1.000–1.030)
Total Protein, Urine: NEGATIVE
Urine Glucose: >=1000 — AB
Urobilinogen, UA: 0.2 (ref 0.0–1.0)
pH: 6.5 (ref 5.0–8.0)

## 2020-09-28 LAB — CBC WITH DIFFERENTIAL/PLATELET
Basophils Absolute: 0.1 10*3/uL (ref 0.0–0.1)
Basophils Relative: 1.5 % (ref 0.0–3.0)
Eosinophils Absolute: 0.3 10*3/uL (ref 0.0–0.7)
Eosinophils Relative: 3.4 % (ref 0.0–5.0)
HCT: 35.6 % — ABNORMAL LOW (ref 36.0–46.0)
Hemoglobin: 11.6 g/dL — ABNORMAL LOW (ref 12.0–15.0)
Lymphocytes Relative: 29 % (ref 12.0–46.0)
Lymphs Abs: 2.7 10*3/uL (ref 0.7–4.0)
MCHC: 32.7 g/dL (ref 30.0–36.0)
MCV: 79.1 fl (ref 78.0–100.0)
Monocytes Absolute: 0.5 10*3/uL (ref 0.1–1.0)
Monocytes Relative: 5.9 % (ref 3.0–12.0)
Neutro Abs: 5.5 10*3/uL (ref 1.4–7.7)
Neutrophils Relative %: 60.2 % (ref 43.0–77.0)
Platelets: 400 10*3/uL (ref 150.0–400.0)
RBC: 4.49 Mil/uL (ref 3.87–5.11)
RDW: 14.5 % (ref 11.5–15.5)
WBC: 9.2 10*3/uL (ref 4.0–10.5)

## 2020-09-28 LAB — LIPID PANEL
Cholesterol: 143 mg/dL (ref 0–200)
HDL: 73.2 mg/dL (ref >39.00)
LDL Cholesterol: 47 mg/dL (ref 0–99)
NonHDL: 70.07
Total CHOL/HDL Ratio: 2
Triglycerides: 113 mg/dL (ref 0.0–149.0)
VLDL: 22.6 mg/dL (ref 0.0–40.0)

## 2020-09-28 LAB — HEPATIC FUNCTION PANEL
ALT: 14 U/L (ref 0–35)
AST: 14 U/L (ref 0–37)
Albumin: 4.4 g/dL (ref 3.5–5.2)
Alkaline Phosphatase: 65 U/L (ref 39–117)
Bilirubin, Direct: 0.1 mg/dL (ref 0.0–0.3)
Total Bilirubin: 0.3 mg/dL (ref 0.2–1.2)
Total Protein: 7.2 g/dL (ref 6.0–8.3)

## 2020-09-28 LAB — BASIC METABOLIC PANEL
BUN: 15 mg/dL (ref 6–23)
CO2: 29 mEq/L (ref 19–32)
Calcium: 9.7 mg/dL (ref 8.4–10.5)
Chloride: 104 mEq/L (ref 96–112)
Creatinine, Ser: 0.68 mg/dL (ref 0.40–1.20)
GFR: 91.92 mL/min (ref >60.00)
Glucose, Bld: 243 mg/dL — ABNORMAL HIGH (ref 70–99)
Potassium: 3.8 mEq/L (ref 3.5–5.1)
Sodium: 139 mEq/L (ref 135–145)

## 2020-09-28 LAB — VITAMIN D 25 HYDROXY (VIT D DEFICIENCY, FRACTURES): VITD: 58.35 ng/mL (ref 30.00–100.00)

## 2020-09-28 LAB — TSH: TSH: 0.83 u[IU]/mL (ref 0.35–4.50)

## 2020-09-28 IMAGING — XA DG INJECT/[PERSON_NAME] INC NEEDLE/CATH/PLC EPI/CERV/THOR W/IMG
2 series · 5 of 5 positions shown · non-contrast
Comparison: none

CLINICAL DATA: Cervical spondylosis without myelopathy. RIGHT arm
radicular symptoms.

[Series 1: ortho standard · 4 of 5 frames shown (1 of 2)]
[frame 1/5]
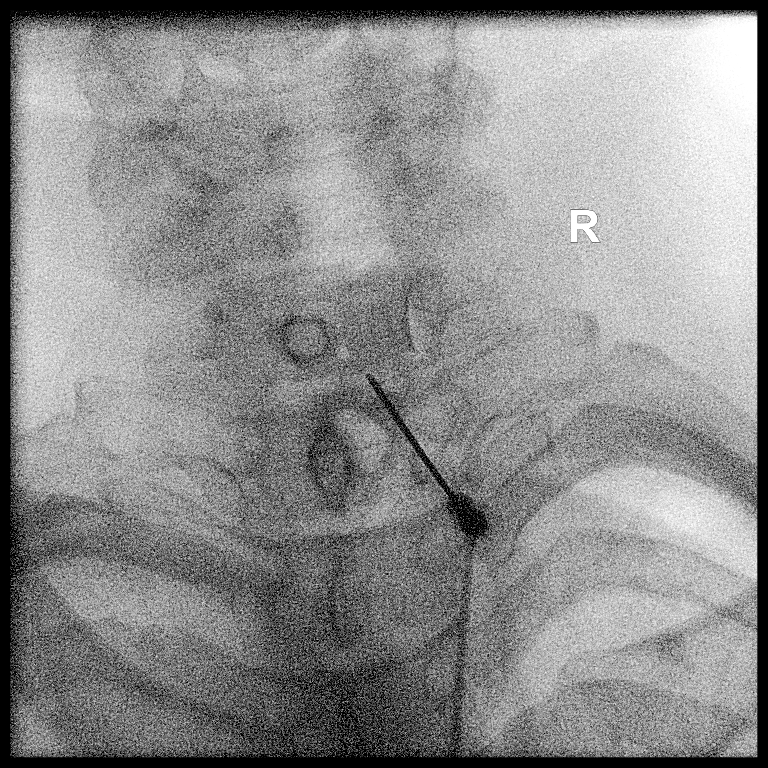
[frame 3/5]
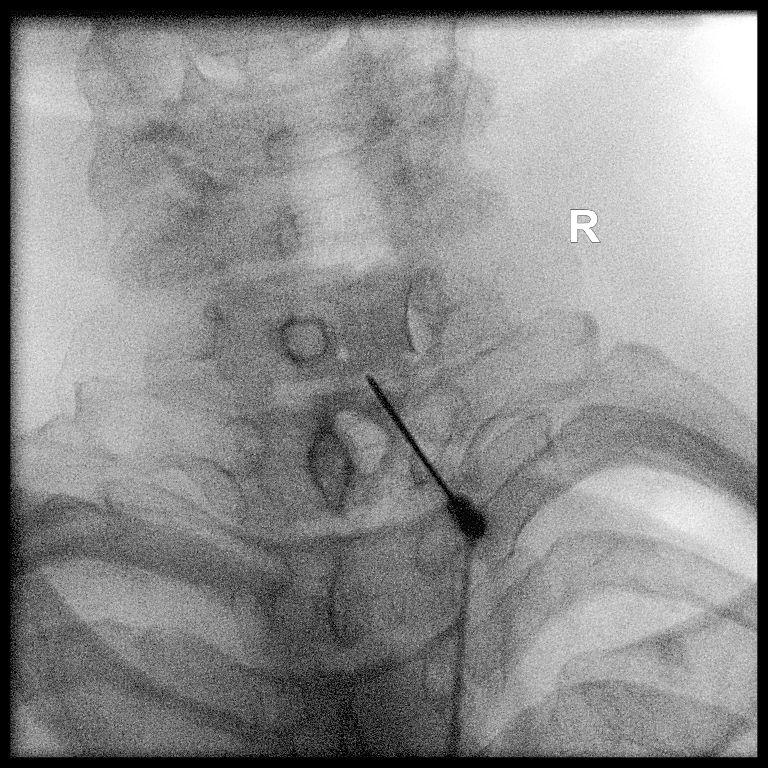
[frame 4/5]
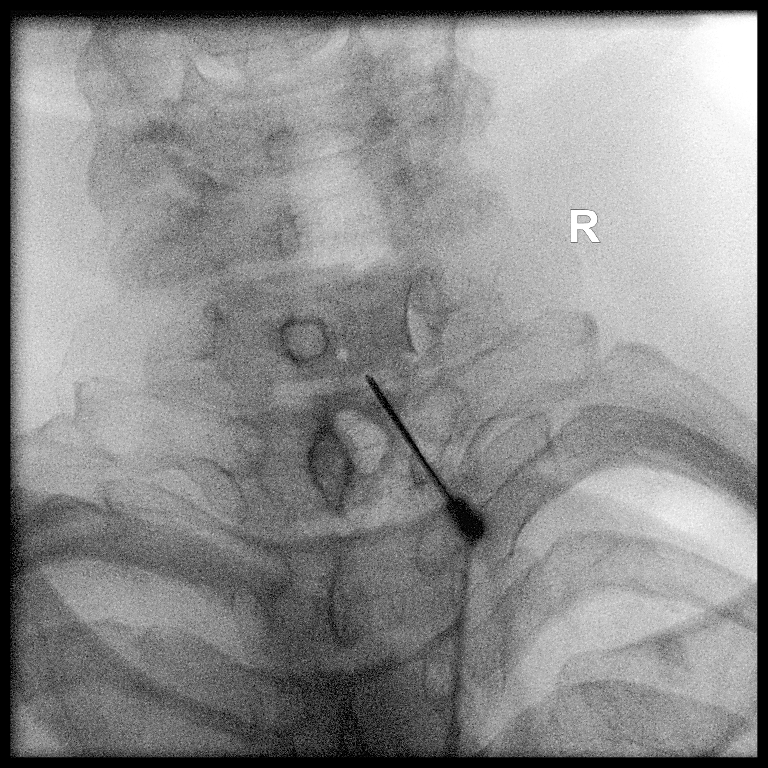
[frame 5/5]
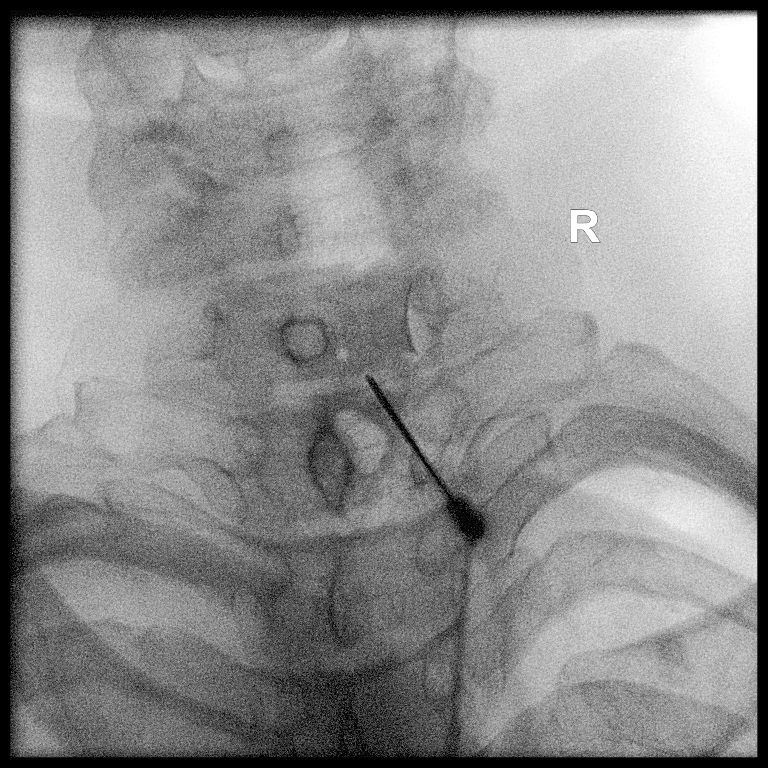

[Series 2: ortho standard · 1 of 1 slices shown (2 of 2)]
[im 1/1]
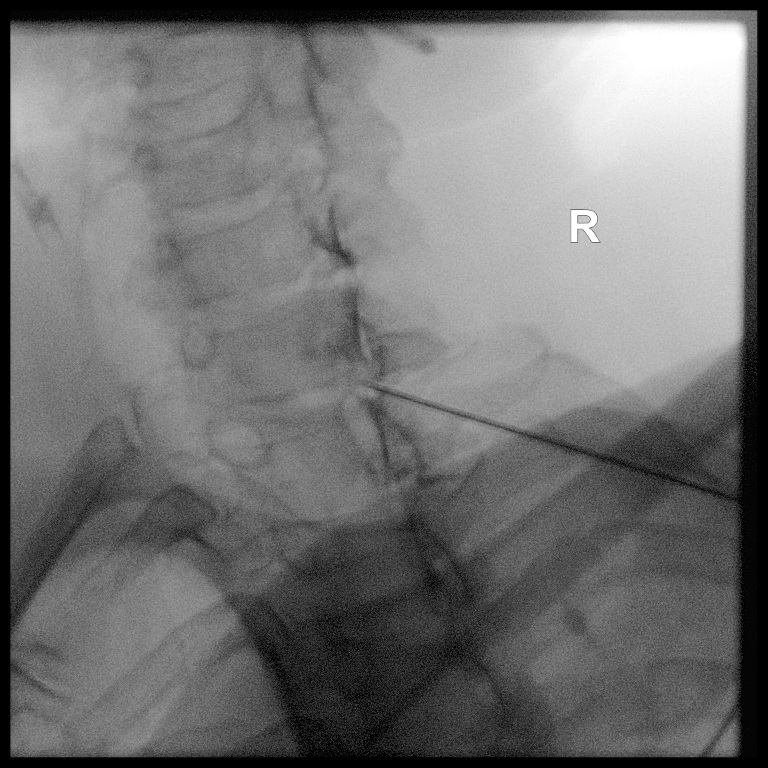

[5 of 5 positions shown; findings below may reference images not displayed]

FLUOROSCOPY TIME:  14 seconds corresponding to a Dose Area Product
of 4.36 Gy*m2

PROCEDURE:
Informed written consent was obtained.  Time-out was performed.

An appropriate skin entry site was chosen, cleansed with Betadine,
and anesthetized with 1% lidocaine.

CERVICAL EPIDURAL INJECTION

An interlaminar approach was performed on the RIGHT at C7-T1. A 20
gauge epidural needle was advanced using loss-of-resistance
technique.

DIAGNOSTIC EPIDURAL INJECTION

Injection of Isovue-M 300 shows a good epidural pattern with spread
above and below the level of needle placement, primarily on the
RIGHT. No vascular opacification is seen.

THERAPEUTIC EPIDURAL INJECTION

1.5 ml of Kenalog 40 mixed with 1 ml of 1% Lidocaine and 2 ml of
normal saline were then instilled. The procedure was well-tolerated,
and the patient was discharged thirty minutes following the
injection in good condition.
IMPRESSION: Technically successful first epidural injection on the RIGHT at
C7-T1.

## 2020-09-28 MED ORDER — DULOXETINE HCL 30 MG PO CPEP: 30.0000 mg | ORAL_CAPSULE | Freq: Every day | ORAL | 3 refills | Status: DC

## 2020-09-28 NOTE — Assessment & Plan Note (Signed)
Cont f/u with ortho, perhaps cymbalta with help with pain as well

## 2020-09-28 NOTE — Assessment & Plan Note (Signed)
Stable,  Lab Results  Component Value Date   HGBA1C 8.8 (A) 06/21/2020  for f/u a1c, cont metformin and glipizide

## 2020-09-28 NOTE — Assessment & Plan Note (Signed)
BP Readings from Last 3 Encounters:  09/28/20 138/82  06/21/20 130/82  06/05/20 (!) 150/90  stable, cont lisinopril

## 2020-09-28 NOTE — Assessment & Plan Note (Signed)
Ok for cymbalta 30 qd, declines referral counseling or psychiatry

## 2020-09-28 NOTE — Progress Notes (Signed)
Established Patient Office Visit  Subjective:  Patient ID: Ruth Crawford, female    DOB: 07/29/1956  Age: 65 y.o. MRN: [id number]  CC:  Chief Complaint  Patient presents with  . Follow-up    Hypertension  and dm, depression and left shoulder pain         HPI:  Ruth Crawford is a 65 y.o. female here for wellness exam      mammagram sched in about 2 wks. Wt Readings from Last 3 Encounters:  09/28/20 119 lb 3.2 oz (54.1 kg)  06/21/20 117 lb (53.1 kg)  06/06/20 117 lb (53.1 kg)   BP Readings from Last 3 Encounters:  09/28/20 138/82  06/21/20 130/82  06/05/20 (!) 150/90        Also c/o acute problem of: 2 months worsening left shoulder pain, dull, moderate,  intermittent, worse to abduct, but has seen sport med without specific diagnosis,has been referred to ortho.  Also c/o mild worsening depressive symptoms, but no suicidal ideation, or panic; has ongoing anxiety as well.  Also husbands daughter not treating the grandchild rieght, pt is worried about the grandchild welfare that she has taken care of for 18 mo.    Past Medical History:  Diagnosis Date  . Allergy   . ANXIETY   . COMMON MIGRAINE   . DEPRESSION   . DIABETES MELLITUS, TYPE II   . GERD (gastroesophageal reflux disease) 10/17/2012  . HYPERLIPIDEMIA   . HYPERTENSION   . IDA (iron deficiency anemia)   . POLYP, GALLBLADDER   . RLS (restless legs syndrome)   . Sickle cell trait [facility])    Past Surgical History:  Procedure Laterality Date  . ABDOMINAL HYSTERECTOMY  2000  . COLONOSCOPY    . [place], 2016, 2017   s/p    reports that she has never smoked. She has never used smokeless tobacco. She reports current alcohol use. She reports that she does not use drugs. family history includes Alcohol abuse in her mother; Arthritis in her mother; Diabetes in her brother and mother; Heart attack in her father; Heart disease in her father; Liver cancer in her mother; Stroke in her father. Allergies  Allergen  Reactions  . Actos [Pioglitazone]     dizziness  . Demerol [Meperidine] Other (See Comments)    Per pt: unknown  . Prednisone Itching and Rash   Current Outpatient Medications on File Prior to Visit  Medication Sig Dispense Refill  . Aspirin-Acetaminophen-Caffeine (EXCEDRIN MIGRAINE PO) Take by mouth as needed.    Marland Kitchen azelastine (OPTIVAR) 0.05 % ophthalmic solution Place 1 drop into both eyes 2 (two) times daily. 6 mL 12  . Blood Glucose Monitoring Suppl (ONETOUCH VERIO) w/Device KIT Use as directed daily E11.9 1 kit 0  . canagliflozin (INVOKANA) 300 MG TABS tablet Take 1 tablet (300 mg total) by mouth daily before breakfast. 90 tablet 3  . Continuous Blood Gluc Receiver (FREESTYLE LIBRE READER) DEVI Apply 1 Device topically every 14 (fourteen) days. E11.9 6 each 3  . Continuous Blood Gluc Sensor (FREESTYLE LIBRE 14 DAY SENSOR) MISC Apply 1 Device topically daily. E11.9 1 each 0  . Diclofenac Sodium 2 % SOLN Place 2 g onto the skin 2 (two) times daily. 112 g 3  . diphenhydrAMINE (BENADRYL) 25 MG tablet Take 25 mg by mouth daily as needed.    Marland Kitchen FERREX 150 150 MG capsule Take 1 capsule by mouth once daily 90 capsule 1  . glipiZIDE (GLUCOTROL  XL) 10 MG 24 hr tablet Take 1 tablet by mouth once daily with breakfast 90 tablet 2  . glucose blood (ONETOUCH VERIO) test strip Use as instructed daily E11.9 100 each 12  . hydrocortisone (ANUSOL-HC) 2.5 % rectal cream Place 1 application rectally 2 (two) times daily. 30 g 0  . hydrOXYzine (ATARAX/VISTARIL) 10 MG tablet Take 1 tablet (10 mg total) by mouth 3 (three) times daily as needed. 30 tablet 0  . Lancets MISC Use as directed daily E11.9 100 each 0  . lisinopril (ZESTRIL) 20 MG tablet Take 1 tablet by mouth once daily 90 tablet 3  . lovastatin (MEVACOR) 40 MG tablet Take 1 tablet by mouth once daily 90 tablet 3  . meloxicam (MOBIC) 7.5 MG tablet Take 1 tablet (7.5 mg total) by mouth daily. 90 tablet 1  . metFORMIN (GLUCOPHAGE) 1000 MG tablet TAKE 1  TABLET BY MOUTH TWICE DAILY WITH MEALS 180 tablet 1  . omeprazole (PRILOSEC) 40 MG capsule Take 1 capsule (40 mg total) by mouth daily. 90 capsule 3  . rOPINIRole (REQUIP) 1 MG tablet Take 1 tablet (1 mg total) by mouth at bedtime. 90 tablet 3  . vitamin B-12 (CYANOCOBALAMIN) 1000 MCG tablet Take 1 tablet (1,000 mcg total) by mouth daily. 90 tablet 3  . gabapentin (NEURONTIN) 300 MG capsule Take 300 mg by mouth daily.     No current facility-administered medications on file prior to visit.        ROS:  All others reviewed and negative.  Objective        PE:  BP 138/82 (BP Location: Left Arm, Patient Position: Sitting, Cuff Size: Normal)   Pulse 92   Temp 98 F (36.7 C) (Oral)   Ht 5' (1.524 m)   Wt 119 lb 3.2 oz (54.1 kg)   SpO2 97%   BMI 23.28 kg/m                 Constitutional: Pt appears in NAD               HENT: Head: NCAT.                Right Ear: External ear normal.                 Left Ear: External ear normal.                Eyes: . Pupils are equal, round, and reactive to light. Conjunctivae and EOM are normal               Nose: without d/c or deformity               Neck: Neck supple. Gross normal ROM               Cardiovascular: Normal rate and regular rhythm.                 Pulmonary/Chest: Effort normal and breath sounds without rales or wheezing.                Abd:  Soft, NT, ND, + BS, no organomegaly               Neurological: Pt is alert. At baseline orientation, motor grossly intact               Skin: Skin is warm. No rashes, no other new lesions, LE edema -none  Psychiatric: Pt behavior is normal without agitation , depressed mood and affect  Assessment/Plan:  ICIE Ruth Crawford is a 65 y.o. Black or African American [2] female with  has a past medical history of Allergy, ANXIETY, COMMON MIGRAINE, DEPRESSION, DIABETES MELLITUS, TYPE II, GERD (gastroesophageal reflux disease) (10/17/2012), HYPERLIPIDEMIA, HYPERTENSION, IDA (iron deficiency  anemia), POLYP, GALLBLADDER, RLS (restless legs syndrome), and Sickle cell trait ([place]).  Assessment Plan  See notes Labs reviewed for each problem: Lab Results  Component Value Date   WBC 8.1 06/05/2020   HGB 11.8 06/05/2020   HCT 37.1 06/05/2020   PLT 521 (H) 06/05/2020   GLUCOSE 395 (H) 06/05/2020   CHOL 126 10/25/2019   TRIG 75.0 10/25/2019   HDL 56.80 10/25/2019   LDLCALC 54 10/25/2019   ALT 12 06/05/2020   AST 13 06/05/2020   NA 140 06/05/2020   K 4.1 06/05/2020   CL 102 06/05/2020   CREATININE 0.65 06/05/2020   BUN 13 06/05/2020   CO2 27 06/05/2020   TSH 1.13 06/05/2020   INR 1.0 08/26/2018   HGBA1C 8.8 (A) 06/21/2020   MICROALBUR 1.8 10/25/2019    Micro: none  Cardiac tracings I have personally interpreted today:  none  Pertinent Radiological findings (summarize): none     There are no preventive care reminders to display for this patient.  There are no preventive care reminders to display for this patient.  Lab Results  Component Value Date   TSH 1.13 06/05/2020   Lab Results  Component Value Date   WBC 8.1 06/05/2020   HGB 11.8 06/05/2020   HCT 37.1 06/05/2020   MCV 82.8 06/05/2020   PLT 521 (H) 06/05/2020   Lab Results  Component Value Date   NA 140 06/05/2020   K 4.1 06/05/2020   CO2 27 06/05/2020   GLUCOSE 395 (H) 06/05/2020   BUN 13 06/05/2020   CREATININE 0.65 06/05/2020   BILITOT 0.4 06/05/2020   ALKPHOS 66 10/25/2019   AST 13 06/05/2020   ALT 12 06/05/2020   PROT 6.6 06/05/2020   ALBUMIN 4.2 10/25/2019   CALCIUM 9.7 06/05/2020   CALCIUM 9.7 06/05/2020   GFR 115.23 10/25/2019   Lab Results  Component Value Date   CHOL 126 10/25/2019   Lab Results  Component Value Date   HDL 56.80 10/25/2019   Lab Results  Component Value Date   LDLCALC 54 10/25/2019   Lab Results  Component Value Date   TRIG 75.0 10/25/2019   Lab Results  Component Value Date   CHOLHDL 2 10/25/2019   Lab Results  Component Value Date   HGBA1C  8.8 (A) 06/21/2020      Assessment & Plan:   Problem List Items Addressed This Visit      Medium   Left shoulder pain    Cont f/u with ortho, perhaps cymbalta with help with pain as well      Essential hypertension    BP Readings from Last 3 Encounters:  09/28/20 138/82  06/21/20 130/82  06/05/20 (!) 150/90  stable, cont lisinopril      Diabetes ([place])    Stable,  Lab Results  Component Value Date   HGBA1C 8.8 (A) 06/21/2020  for f/u a1c, cont metformin and glipizide      Relevant Orders   Microalbumin / creatinine urine ratio   Hemoglobin A1c   Lipid panel   Hepatic function panel   CBC with Differential/Platelet   TSH   Urinalysis, Routine w reflex microscopic   Basic  metabolic panel   Depression    Ok for cymbalta 30 qd, declines referral counseling or psychiatry      Relevant Medications   DULoxetine (CYMBALTA) 30 MG capsule     Unprioritized   Encounter for well adult exam with abnormal findings - Primary    Overall doing well, age appropriate education and counseling updated, referrals for preventative services and immunizations addressed, dietary and smoking counseling addressed, most recent labs reviewed.  I have personally reviewed and have noted:  1) the patient's medical and social history 2) The pt's use of alcohol, tobacco, and illicit drugs 3) The patient's current medications and supplements 4) Functional ability including ADL's, fall risk, home safety risk, hearing and visual impairment 5) Diet and physical activities 6) Evidence for depression or mood disorder 7) The patient's height, weight, and BMI have been recorded in the chart  I have made referrals, and provided counseling and education based on review of the above        Other Visit Diagnoses    Vitamin D deficiency       Relevant Orders   VITAMIN D 25 Hydroxy (Vit-D Deficiency, Fractures)      Meds ordered this encounter  Medications  . DULoxetine (CYMBALTA) 30 MG capsule     Sig: Take 1 capsule (30 mg total) by mouth daily.    Dispense:  90 capsule    Refill:  3    Follow-up: Return in about 6 months (around 03/28/2021).   Cathlean Cower, MD 09/28/2020 9:13 PM [facility]

## 2020-09-28 NOTE — Assessment & Plan Note (Signed)

## 2020-09-28 NOTE — Patient Instructions (Addendum)
Please call if you would want the referral to DR Ranell Patrick for your left shoulder  Please take all new medication as prescribed - the cymbalta 30 mg per day for pain and low mood  Please call in 4 wks if you would like a higher dose of the cymbalta  Please continue all other medications as before, and refills have been done if requested.  Please have the pharmacy call with any other refills you may need.  Please continue your efforts at being more active, low cholesterol diet, and weight control.  You are otherwise up to date with prevention measures today.  Please keep your appointments with your specialists as you may have planned  Please go to the LAB at the blood drawing area for the tests to be done  You will be contacted by phone if any changes need to be made immediately.  Otherwise, you will receive a letter about your results with an explanation, but please check with MyChart first.  Please remember to sign up for MyChart if you have not done so, as this will be important to you in the future with finding out test results, communicating by private email, and scheduling acute appointments online when needed.  Please make an Appointment to return in 6 months, or sooner if needed

## 2020-09-28 NOTE — Telephone Encounter (Signed)
MD states pt presented to his office for c/o neck pain. Upon exam, automated BP 193/111, manual 188/106.  Pt endorses h/a that is relieved with meds & vision changes over the last couple days.  Appt made for today with Dr Jonny Ruiz for HTN management.  Pt verb understanding.

## 2020-09-29 ENCOUNTER — Encounter: Payer: Self-pay | Admitting: Internal Medicine

## 2020-09-29 LAB — MICROALBUMIN / CREATININE URINE RATIO
Creatinine,U: 32.1 mg/dL
Microalb Creat Ratio: 5.5 mg/g (ref 0.0–30.0)
Microalb, Ur: 1.8 mg/dL (ref 0.0–1.9)

## 2020-09-29 LAB — HEMOGLOBIN A1C
Hgb A1c MFr Bld: 10.5 % of total Hgb — ABNORMAL HIGH (ref <5.7)
Mean Plasma Glucose: 255 mg/dL
eAG (mmol/L): 14.1 mmol/L

## 2020-10-01 ENCOUNTER — Other Ambulatory Visit: Payer: Self-pay | Admitting: Internal Medicine

## 2020-10-01 ENCOUNTER — Encounter: Payer: Self-pay | Admitting: Internal Medicine

## 2020-10-01 MED ORDER — TRULICITY 0.75 MG/0.5ML ~~LOC~~ SOAJ: 0.7500 mg | SUBCUTANEOUS | 3 refills | Status: DC

## 2020-10-04 ENCOUNTER — Encounter: Payer: Self-pay | Admitting: Internal Medicine

## 2020-10-06 MED ORDER — CANAGLIFLOZIN 300 MG PO TABS: 300.0000 mg | ORAL_TABLET | Freq: Every day | ORAL | 3 refills | Status: DC

## 2020-11-06 ENCOUNTER — Other Ambulatory Visit: Payer: Self-pay | Admitting: Internal Medicine

## 2020-11-16 ENCOUNTER — Other Ambulatory Visit: Payer: Self-pay | Admitting: Internal Medicine

## 2020-11-16 NOTE — Telephone Encounter (Signed)
Please refill as per office routine med refill policy (all routine meds refilled for 3 mo or monthly per pt preference up to one year from last visit, then month to month grace period for 3 mo, then further med refills will have to be denied)  

## 2020-12-01 ENCOUNTER — Other Ambulatory Visit: Payer: Self-pay | Admitting: Internal Medicine

## 2020-12-01 NOTE — Telephone Encounter (Signed)
Please refill as per office routine med refill policy (all routine meds refilled for 3 mo or monthly per pt preference up to one year from last visit, then month to month grace period for 3 mo, then further med refills will have to be denied)  

## 2020-12-01 NOTE — Telephone Encounter (Signed)
Sorry no refill needed 

## 2020-12-06 ENCOUNTER — Other Ambulatory Visit: Payer: Self-pay | Admitting: Internal Medicine

## 2020-12-08 ENCOUNTER — Telehealth: Payer: Self-pay | Admitting: Internal Medicine

## 2020-12-11 NOTE — Telephone Encounter (Signed)
1.Medication Requested: glipiZIDE (GLUCOTROL XL) 10 MG 24 hr tablet    2. Pharmacy (Name, Street, Loop): [id number] - [place], [place] - [address]  3. On Med List: no   4. Last Visit with PCP: 09/28/20  5. Next visit date with PCP: 03/29/21   Agent: Please be advised that RX refills may take up to 3 business days. We ask that you follow-up with your pharmacy.

## 2020-12-12 NOTE — Telephone Encounter (Signed)
Please refill as per office routine med refill policy (all routine meds refilled for 3 mo or monthly per pt preference up to one year from last visit, then month to month grace period for 3 mo, then further med refills will have to be denied)  

## 2020-12-13 NOTE — Telephone Encounter (Signed)
Ok to not refill

## 2020-12-18 ENCOUNTER — Ambulatory Visit: Payer: BC Managed Care – PPO | Admitting: Internal Medicine

## 2021-01-09 ENCOUNTER — Encounter: Payer: Self-pay | Admitting: Internal Medicine

## 2021-01-09 ENCOUNTER — Other Ambulatory Visit: Payer: BC Managed Care – PPO

## 2021-01-09 ENCOUNTER — Ambulatory Visit (INDEPENDENT_AMBULATORY_CARE_PROVIDER_SITE_OTHER): Payer: BC Managed Care – PPO | Admitting: Internal Medicine

## 2021-01-09 ENCOUNTER — Other Ambulatory Visit: Payer: Self-pay

## 2021-01-09 VITALS — BP 142/80 | HR 89 | Temp 98.3°F | Ht 60.0 in | Wt 114.0 lb

## 2021-01-09 DIAGNOSIS — E1165 Type 2 diabetes mellitus with hyperglycemia: Secondary | ICD-10-CM

## 2021-01-09 DIAGNOSIS — R7309 Other abnormal glucose: Secondary | ICD-10-CM

## 2021-01-09 DIAGNOSIS — I10 Essential (primary) hypertension: Secondary | ICD-10-CM | POA: Diagnosis not present

## 2021-01-09 DIAGNOSIS — E7800 Pure hypercholesterolemia, unspecified: Secondary | ICD-10-CM

## 2021-01-09 LAB — URINALYSIS, ROUTINE W REFLEX MICROSCOPIC
Bilirubin Urine: NEGATIVE
Hgb urine dipstick: NEGATIVE
Ketones, ur: NEGATIVE
Leukocytes,Ua: NEGATIVE
Nitrite: NEGATIVE
Specific Gravity, Urine: 1.01 (ref 1.000–1.030)
Total Protein, Urine: NEGATIVE
Urine Glucose: >=1000 — AB
Urobilinogen, UA: 0.2 (ref 0.0–1.0)
pH: 5.5 (ref 5.0–8.0)

## 2021-01-09 LAB — CBC WITH DIFFERENTIAL/PLATELET
Basophils Absolute: 0.1 10*3/uL (ref 0.0–0.1)
Basophils Relative: 1.3 % (ref 0.0–3.0)
Eosinophils Absolute: 0.4 10*3/uL (ref 0.0–0.7)
Eosinophils Relative: 4.2 % (ref 0.0–5.0)
HCT: 34.4 % — ABNORMAL LOW (ref 36.0–46.0)
Hemoglobin: 11.4 g/dL — ABNORMAL LOW (ref 12.0–15.0)
Lymphocytes Relative: 25.5 % (ref 12.0–46.0)
Lymphs Abs: 2.2 10*3/uL (ref 0.7–4.0)
MCHC: 33.1 g/dL (ref 30.0–36.0)
MCV: 77.7 fl — ABNORMAL LOW (ref 78.0–100.0)
Monocytes Absolute: 0.6 10*3/uL (ref 0.1–1.0)
Monocytes Relative: 7.2 % (ref 3.0–12.0)
Neutro Abs: 5.4 10*3/uL (ref 1.4–7.7)
Neutrophils Relative %: 61.8 % (ref 43.0–77.0)
Platelets: 389 10*3/uL (ref 150.0–400.0)
RBC: 4.43 Mil/uL (ref 3.87–5.11)
RDW: 15.4 % (ref 11.5–15.5)
WBC: 8.7 10*3/uL (ref 4.0–10.5)

## 2021-01-09 LAB — BASIC METABOLIC PANEL
BUN: 18 mg/dL (ref 6–23)
CO2: 29 mEq/L (ref 19–32)
Calcium: 9.2 mg/dL (ref 8.4–10.5)
Chloride: 100 mEq/L (ref 96–112)
Creatinine, Ser: 0.68 mg/dL (ref 0.40–1.20)
GFR: 91.74 mL/min (ref >60.00)
Glucose, Bld: 253 mg/dL — ABNORMAL HIGH (ref 70–99)
Potassium: 3.8 mEq/L (ref 3.5–5.1)
Sodium: 136 mEq/L (ref 135–145)

## 2021-01-09 LAB — HEPATIC FUNCTION PANEL
ALT: 15 U/L (ref 0–35)
AST: 13 U/L (ref 0–37)
Albumin: 4 g/dL (ref 3.5–5.2)
Alkaline Phosphatase: 50 U/L (ref 39–117)
Bilirubin, Direct: 0.1 mg/dL (ref 0.0–0.3)
Total Bilirubin: 0.5 mg/dL (ref 0.2–1.2)
Total Protein: 6.9 g/dL (ref 6.0–8.3)

## 2021-01-09 MED ORDER — METFORMIN HCL 1000 MG PO TABS: 1.0000 | ORAL_TABLET | Freq: Two times a day (BID) | ORAL | 3 refills | Status: DC

## 2021-01-09 MED ORDER — CANAGLIFLOZIN 300 MG PO TABS: 300.0000 mg | ORAL_TABLET | Freq: Every day | ORAL | 3 refills | Status: DC

## 2021-01-09 MED ORDER — GLIPIZIDE ER 10 MG PO TB24: 10.0000 mg | ORAL_TABLET | Freq: Every day | ORAL | 3 refills | Status: DC

## 2021-01-09 NOTE — Patient Instructions (Signed)
Ok to take the medications as prescribed today for sugar - the metformin, glipizide, and invokana  Please continue all other medications as before, and refills have been done if requested.  Please have the pharmacy call with any other refills you may need.  Please continue your efforts at being more active, low cholesterol diabetic diet  Please keep your appointments with your specialists as you may have planned  Please go to the LAB at the blood drawing area for the tests to be done  You will be contacted by phone if any changes need to be made immediately.  Otherwise, you will receive a letter about your results with an explanation, but please check with MyChart first.  Please remember to sign up for MyChart if you have not done so, as this will be important to you in the future with finding out test results, communicating by private email, and scheduling acute appointments online when needed.  Please make an Appointment to return in 3 months, or sooner if needed

## 2021-01-09 NOTE — Progress Notes (Signed)
Patient ID: Ruth Crawford, female   DOB: February 27, 1956, 65 y.o.   MRN: [id number]        Chief Complaint: follow up HTN, HLD and hyperglycemia and marked fatigue       HPI:  Ruth Crawford is a 65 y.o. female here with c/o marked fatigue worse over the past wk but hard to o/w be more specific; Pt denies chest pain, increased sob or doe, wheezing, orthopnea, PND, increased LE swelling, palpitations, dizziness or syncope.   Pt denies polydipsia, polyuria, or low sugar symptoms .  Pt states overall good compliance with meds but only taking metformin for now, trying to follow lower cholesterol, diabetic diet, wt overall down several lbs despite little exercise however.    Currently only taking metformin ; not taking trulicity due to cost, and not taking invokana due to some miscommunication at last visit apparently, and now ran out of glipizide as well.   Pt denies fever, night sweats, loss of appetite, or other constitutional symptoms   Wt Readings from Last 3 Encounters:  01/09/21 114 lb (51.7 kg)  09/28/20 119 lb 3.2 oz (54.1 kg)  06/21/20 117 lb (53.1 kg)   BP Readings from Last 3 Encounters:  01/09/21 (!) 142/80  09/28/20 138/82  06/21/20 130/82         Past Medical History:  Diagnosis Date  . Allergy   . ANXIETY   . COMMON MIGRAINE   . DEPRESSION   . DIABETES MELLITUS, TYPE II   . GERD (gastroesophageal reflux disease) 10/17/2012  . HYPERLIPIDEMIA   . HYPERTENSION   . IDA (iron deficiency anemia)   . POLYP, GALLBLADDER   . RLS (restless legs syndrome)   . Sickle cell trait [facility])    Past Surgical History:  Procedure Laterality Date  . ABDOMINAL HYSTERECTOMY  2000  . COLONOSCOPY    . [place], 2016, 2017   s/p    reports that she has never smoked. She has never used smokeless tobacco. She reports current alcohol use. She reports that she does not use drugs. family history includes Alcohol abuse in her mother; Arthritis in her mother; Diabetes in her brother and  mother; Heart attack in her father; Heart disease in her father; Liver cancer in her mother; Stroke in her father. Allergies  Allergen Reactions  . Actos [Pioglitazone]     dizziness  . Demerol [Meperidine] Other (See Comments)    Per pt: unknown  . Prednisone Itching and Rash   Current Outpatient Medications on File Prior to Visit  Medication Sig Dispense Refill  . Aspirin-Acetaminophen-Caffeine (EXCEDRIN MIGRAINE PO) Take by mouth as needed.    Marland Kitchen azelastine (OPTIVAR) 0.05 % ophthalmic solution Place 1 drop into both eyes 2 (two) times daily. 6 mL 12  . Blood Glucose Monitoring Suppl (ONETOUCH VERIO) w/Device KIT Use as directed daily E11.9 1 kit 0  . Continuous Blood Gluc Receiver (FREESTYLE LIBRE READER) DEVI Apply 1 Device topically every 14 (fourteen) days. E11.9 6 each 3  . Continuous Blood Gluc Sensor (FREESTYLE LIBRE 14 DAY SENSOR) MISC Apply 1 Device topically daily. E11.9 1 each 0  . Diclofenac Sodium 2 % SOLN Place 2 g onto the skin 2 (two) times daily. 112 g 3  . diphenhydrAMINE (BENADRYL) 25 MG tablet Take 25 mg by mouth daily as needed.    . DULoxetine (CYMBALTA) 30 MG capsule Take 1 capsule (30 mg total) by mouth daily. 90 capsule 3  . FERREX 150 150  MG capsule Take 1 capsule by mouth once daily 90 capsule 1  . gabapentin (NEURONTIN) 300 MG capsule Take 300 mg by mouth daily.    Marland Kitchen glucose blood (ONETOUCH VERIO) test strip Use as instructed daily E11.9 100 each 12  . hydrocortisone (ANUSOL-HC) 2.5 % rectal cream Place 1 application rectally 2 (two) times daily. 30 g 0  . hydrOXYzine (ATARAX/VISTARIL) 10 MG tablet Take 1 tablet (10 mg total) by mouth 3 (three) times daily as needed. 30 tablet 0  . Lancets MISC Use as directed daily E11.9 100 each 0  . lisinopril (ZESTRIL) 20 MG tablet Take 1 tablet by mouth once daily 90 tablet 0  . lovastatin (MEVACOR) 40 MG tablet Take 1 tablet by mouth once daily 90 tablet 0  . meloxicam (MOBIC) 7.5 MG tablet Take 1 tablet (7.5 mg total) by  mouth daily. 90 tablet 1  . omeprazole (PRILOSEC) 40 MG capsule Take 1 capsule (40 mg total) by mouth daily. 90 capsule 3  . rOPINIRole (REQUIP) 1 MG tablet TAKE 1 TABLET BY MOUTH AT BEDTIME 90 tablet 1  . vitamin B-12 (CYANOCOBALAMIN) 1000 MCG tablet Take 1 tablet (1,000 mcg total) by mouth daily. 90 tablet 3   No current facility-administered medications on file prior to visit.        ROS:  All others reviewed and negative.  Objective        PE:  BP (!) 142/80   Pulse 89   Temp 98.3 F (36.8 C) (Oral)   Ht 5' (1.524 m)   Wt 114 lb (51.7 kg)   SpO2 97%   BMI 22.26 kg/m                 Constitutional: Pt appears in NAD but quite fatigued, lack off energy, somewhat disheveled                HENT: Head: NCAT.                Right Ear: External ear normal.                 Left Ear: External ear normal.                Eyes: . Pupils are equal, round, and reactive to light. Conjunctivae and EOM are normal               Nose: without d/c or deformity               Neck: Neck supple. Gross normal ROM               Cardiovascular: Normal rate and regular rhythm.                 Pulmonary/Chest: Effort normal and breath sounds without rales or wheezing.                Abd:  Soft, NT, ND, + BS, no organomegaly               Neurological: Pt is alert. At baseline orientation, motor grossly intact               Skin: Skin is warm. No rashes, no other new lesions, LE edema -none               Psychiatric: Pt behavior is normal without agitation   Micro: none  Cardiac tracings I have personally interpreted today:  none  Pertinent Radiological findings (summarize): none  Lab Results  Component Value Date   WBC 8.7 01/09/2021   HGB 11.4 (L) 01/09/2021   HCT 34.4 (L) 01/09/2021   PLT 389.0 01/09/2021   GLUCOSE 253 (H) 01/09/2021   CHOL 143 09/28/2020   TRIG 113.0 09/28/2020   HDL 73.20 09/28/2020   LDLCALC 47 09/28/2020   ALT 15 01/09/2021   AST 13 01/09/2021   NA 136 01/09/2021    K 3.8 01/09/2021   CL 100 01/09/2021   CREATININE 0.68 01/09/2021   BUN 18 01/09/2021   CO2 29 01/09/2021   TSH 0.83 09/28/2020   INR 1.0 08/26/2018   HGBA1C 10.9 (H) 01/09/2021   MICROALBUR 1.8 09/28/2020   Assessment/Plan:  KYERA FELAN is a 65 y.o. Black or African American [2] female with  has a past medical history of Allergy, ANXIETY, COMMON MIGRAINE, DEPRESSION, DIABETES MELLITUS, TYPE II, GERD (gastroesophageal reflux disease) (10/17/2012), HYPERLIPIDEMIA, HYPERTENSION, IDA (iron deficiency anemia), POLYP, GALLBLADDER, RLS (restless legs syndrome), and Sickle cell trait ([place]).  Diabetes I suspect uncontrolled DM with polyuria and hard to keep up fluids as at least an element of her fatigue - a1c is markedly elevated > 10 - for contd metformin, restart glipizide, and add invokana  Essential hypertension BP Readings from Last 3 Encounters:  01/09/21 (!) 142/80  09/28/20 138/82  06/21/20 130/82   Mild elevated likely reactive,, pt to continue current medical treatment lisionpril 20   Hyperlipidemia Lab Results  Component Value Date   LDLCALC 47 09/28/2020   Stable, pt to continue current statin lovastatin 40   Followup: Return in about 3 months (around 04/10/2021).  Cathlean Cower, MD 01/14/2021 9:20 PM [facility]

## 2021-01-10 ENCOUNTER — Encounter: Payer: Self-pay | Admitting: Internal Medicine

## 2021-01-10 LAB — HEMOGLOBIN A1C
Hgb A1c MFr Bld: 10.9 % of total Hgb — ABNORMAL HIGH (ref <5.7)
Mean Plasma Glucose: 266 mg/dL
eAG (mmol/L): 14.7 mmol/L

## 2021-01-13 LAB — BETA-HYDROXYBUTYRATE: Beta-Hydroxybutyric Acid: 0.15 mmol/L

## 2021-01-14 ENCOUNTER — Encounter: Payer: Self-pay | Admitting: Internal Medicine

## 2021-01-14 NOTE — Assessment & Plan Note (Signed)
BP Readings from Last 3 Encounters:  01/09/21 (!) 142/80  09/28/20 138/82  06/21/20 130/82   Mild elevated likely reactive,, pt to continue current medical treatment lisionpril 20

## 2021-01-14 NOTE — Assessment & Plan Note (Signed)
Lab Results  Component Value Date   LDLCALC 47 09/28/2020   Stable, pt to continue current statin lovastatin 40

## 2021-01-14 NOTE — Assessment & Plan Note (Addendum)
I suspect uncontrolled DM with polyuria and hard to keep up fluids as at least an element of her fatigue - a1c is markedly elevated > 10 - for contd metformin, restart glipizide, and add invokana

## 2021-01-16 ENCOUNTER — Encounter: Payer: Self-pay | Admitting: Internal Medicine

## 2021-01-16 DIAGNOSIS — E1165 Type 2 diabetes mellitus with hyperglycemia: Secondary | ICD-10-CM

## 2021-02-13 ENCOUNTER — Other Ambulatory Visit: Payer: Self-pay

## 2021-02-13 ENCOUNTER — Encounter: Payer: Self-pay | Admitting: Internal Medicine

## 2021-02-13 ENCOUNTER — Ambulatory Visit (INDEPENDENT_AMBULATORY_CARE_PROVIDER_SITE_OTHER): Payer: BC Managed Care – PPO | Admitting: Internal Medicine

## 2021-02-13 VITALS — BP 138/86 | HR 90 | Ht 60.0 in | Wt 113.2 lb

## 2021-02-13 DIAGNOSIS — E785 Hyperlipidemia, unspecified: Secondary | ICD-10-CM

## 2021-02-13 DIAGNOSIS — E1165 Type 2 diabetes mellitus with hyperglycemia: Secondary | ICD-10-CM | POA: Diagnosis not present

## 2021-02-13 LAB — POCT GLUCOSE (DEVICE FOR HOME USE): POC Glucose: 301 mg/dl — AB (ref 70–99)

## 2021-02-13 MED ORDER — METFORMIN HCL 1000 MG PO TABS: 1.0000 | ORAL_TABLET | Freq: Two times a day (BID) | ORAL | 3 refills | Status: DC

## 2021-02-13 MED ORDER — LANTUS SOLOSTAR 100 UNIT/ML ~~LOC~~ SOPN: 10.0000 [IU] | PEN_INJECTOR | Freq: Every day | SUBCUTANEOUS | 6 refills | Status: DC

## 2021-02-13 MED ORDER — FREESTYLE LIBRE 2 SENSOR MISC: 1.0000 | 3 refills | Status: DC

## 2021-02-13 MED ORDER — INSULIN PEN NEEDLE 32G X 4 MM MISC: 1.0000 | Freq: Every day | 6 refills | Status: DC

## 2021-02-13 NOTE — Progress Notes (Signed)
Name: Ruth Crawford  MRN/ DOB: [id number], 08/10/1956   Age/ Sex: 65 y.o., female    PCP: Biagio Borg, MD   Reason for Endocrinology Evaluation: Type 2 Diabetes Mellitus     Date of Initial Endocrinology Visit: 02/13/2021     PATIENT IDENTIFIER: Ruth Crawford is a 65 y.o. female with a past medical history of T2DM, sickle cell trait , RLS, anemia, dyslipidemia. The patient presented for initial endocrinology clinic visit on 02/13/2021 for consultative assistance with her diabetes management.    HPI: Ruth Crawford was    Diagnosed with DM in 2008 Prior Medications tried/Intolerance: Invokana - cost prohibitive  Currently checking blood sugars occasionally   Hypoglycemia episodes : no           Hemoglobin A1c has ranged from 6.4% in 2015, peaking at 10.9% in 2022. Patient required assistance for hypoglycemia: no  Patient has required hospitalization within the last 1 year from hyper or hypoglycemia: no  In terms of diet, the patient eats 3 meals a day, snacks every 2 hours.    HOME DIABETES REGIMEN: Invokana 300 mg daily - not taking  Metformin 1000 mg BID  Glipizide 10 mg XL daily     Statin: yes ACE-I/ARB: yes  Prior Diabetic Education: no   METER DOWNLOAD SUMMARY: Did not bring    DIABETIC COMPLICATIONS: Microvascular complications:    Denies: CKD, retinopathy, neuropathy   Last eye exam: Completed 2022  Macrovascular complications:    Denies: CAD, PVD, CVA   PAST HISTORY: Past Medical History:  Past Medical History:  Diagnosis Date  . Allergy   . ANXIETY   . COMMON MIGRAINE   . DEPRESSION   . DIABETES MELLITUS, TYPE II   . GERD (gastroesophageal reflux disease) 10/17/2012  . HYPERLIPIDEMIA   . HYPERTENSION   . IDA (iron deficiency anemia)   . POLYP, GALLBLADDER   . RLS (restless legs syndrome)   . Sickle cell trait [facility])    Past Surgical History:  Past Surgical History:  Procedure Laterality Date  . ABDOMINAL HYSTERECTOMY  2000  .  COLONOSCOPY    . [place], 2016, 2017   s/p      Social History:  reports that she has never smoked. She has never used smokeless tobacco. She reports current alcohol use. She reports that she does not use drugs. Family History:  Family History  Problem Relation Age of Onset  . Diabetes Mother   . Liver cancer Mother   . Arthritis Mother   . Alcohol abuse Mother   . Heart disease Father   . Heart attack Father   . Stroke Father   . Diabetes Brother   . Colon cancer Neg Hx   . Esophageal cancer Neg Hx   . Rectal cancer Neg Hx   . Stomach cancer Neg Hx      HOME MEDICATIONS: Allergies as of 02/13/2021      Reactions   Actos [pioglitazone]    dizziness   Demerol [meperidine] Other (See Comments)   Per pt: unknown   Prednisone Itching, Rash      Medication List       Accurate as of Feb 13, 2021 10:04 AM. If you have any questions, ask your nurse or doctor.        azelastine 0.05 % ophthalmic solution Commonly known as: OPTIVAR Place 1 drop into both eyes 2 (two) times daily.   canagliflozin 300 MG Tabs tablet Commonly known as:  Invokana Take 1 tablet (300 mg total) by mouth daily before breakfast.   Diclofenac Sodium 2 % Soln Place 2 g onto the skin 2 (two) times daily.   diphenhydrAMINE 25 MG tablet Commonly known as: BENADRYL Take 25 mg by mouth daily as needed.   DULoxetine 30 MG capsule Commonly known as: CYMBALTA Take 1 capsule (30 mg total) by mouth daily.   EXCEDRIN MIGRAINE PO Take by mouth as needed.   Ferrex 150 150 MG capsule Generic drug: iron polysaccharides Take 1 capsule by mouth once daily   FreeStyle Libre 14 Day Sensor Misc Apply 1 Device topically daily. E11.9   FreeStyle Libre Reader Devi Apply 1 Device topically every 14 (fourteen) days. E11.9   gabapentin 300 MG capsule Commonly known as: NEURONTIN Take 300 mg by mouth daily.   glipiZIDE 10 MG 24 hr tablet Commonly known as: GLUCOTROL XL Take 1 tablet  (10 mg total) by mouth daily with breakfast.   hydrocortisone 2.5 % rectal cream Commonly known as: Anusol-HC Place 1 application rectally 2 (two) times daily.   hydrOXYzine 10 MG tablet Commonly known as: ATARAX/VISTARIL Take 1 tablet (10 mg total) by mouth 3 (three) times daily as needed.   Lancets Misc Use as directed daily E11.9   lisinopril 20 MG tablet Commonly known as: ZESTRIL Take 1 tablet by mouth once daily   lovastatin 40 MG tablet Commonly known as: MEVACOR Take 1 tablet by mouth once daily   meloxicam 7.5 MG tablet Commonly known as: MOBIC Take 1 tablet (7.5 mg total) by mouth daily.   metFORMIN 1000 MG tablet Commonly known as: GLUCOPHAGE Take 1 tablet (1,000 mg total) by mouth 2 (two) times daily with a meal.   omeprazole 40 MG capsule Commonly known as: PRILOSEC Take 1 capsule (40 mg total) by mouth daily.   OneTouch Verio test strip Generic drug: glucose blood Use as instructed daily E11.9   OneTouch Verio w/Device Kit Use as directed daily E11.9   rOPINIRole 1 MG tablet Commonly known as: REQUIP TAKE 1 TABLET BY MOUTH AT BEDTIME   vitamin B-12 1000 MCG tablet Commonly known as: CYANOCOBALAMIN Take 1 tablet (1,000 mcg total) by mouth daily.        ALLERGIES: Allergies  Allergen Reactions  . Actos [Pioglitazone]     dizziness  . Demerol [Meperidine] Other (See Comments)    Per pt: unknown  . Prednisone Itching and Rash     REVIEW OF SYSTEMS: A comprehensive ROS was conducted with the patient and is negative except as per HPI and below:  Review of Systems  Gastrointestinal: Negative for diarrhea and nausea.      OBJECTIVE:   VITAL SIGNS: BP 138/86   Pulse 90   Ht 5' (1.524 m)   Wt 113 lb 4 oz (51.4 kg)   SpO2 97%   BMI 22.12 kg/m    PHYSICAL EXAM:  General: Pt appears well and is in NAD  Neck: General: Supple without adenopathy or carotid bruits. Thyroid: Thyroid size normal.  No goiter or nodules appreciated.  Lungs:  Clear with good BS bilat with no rales, rhonchi, or wheezes  Heart: RRR   Abdomen: Normoactive bowel sounds, soft, nontender, without masses or organomegaly palpable  Extremities:  Lower extremities - No pretibial edema. No lesions.  Skin: Normal texture and temperature to palpation.   Neuro: MS is good with appropriate affect, pt is alert and Ox3    DM foot exam: 02/13/2021   The skin of the  feet is intact without sores or ulcerations. The pedal pulses are 2+ on right and 2+ on left. The sensation is intact to a screening 5.07, 10 gram monofilament bilaterally     DATA REVIEWED:  Lab Results  Component Value Date   HGBA1C 10.9 (H) 01/09/2021   HGBA1C 10.5 (H) 09/28/2020   HGBA1C 8.8 (A) 06/21/2020   Lab Results  Component Value Date   MICROALBUR 1.8 09/28/2020   LDLCALC 47 09/28/2020   CREATININE 0.68 01/09/2021   Lab Results  Component Value Date   MICRALBCREAT 5.5 09/28/2020    Lab Results  Component Value Date   CHOL 143 09/28/2020   HDL 73.20 09/28/2020   LDLCALC 47 09/28/2020   TRIG 113.0 09/28/2020   CHOLHDL 2 09/28/2020        ASSESSMENT / PLAN / RECOMMENDATIONS:   1) Type 2 Diabetes Mellitus, Poorly controlled, Without complications - Most recent A1c of 10.9%. Goal A1c < 7.0%.    Plan: GENERAL: I have discussed with the patient the pathophysiology of diabetes. We went over the natural progression of the disease. We talked about both insulin resistance and insulin deficiency. We stressed the importance of lifestyle changes including diet and exercise. I explained the complications associated with diabetes including retinopathy, nephropathy, neuropathy as well as increased risk of cardiovascular disease. We went over the benefit seen with glycemic control.    I explained to the patient that diabetic patients are at higher than normal risk for amputations.   Give low BMI, will check for autoantibodies with GAD-65 and Iselt cell Ab's  Given elevated A1c  > 10.0% , I have recommended insulin which she is in agreement of. She was trained on insulin pen today   MEDICATIONS: - Stop Glipizide  - Continue Metformin 1000 mg twice  - Start Lantus 10 units once  daily    EDUCATION / INSTRUCTIONS:  BG monitoring instructions: Patient is instructed to check her blood sugars1 times a day, fasting   Call [facility] clinic if: BG persistently < 70 . I reviewed the Rule of 15 for the treatment of hypoglycemia in detail with the patient. Literature supplied.   2) Diabetic complications:   Eye: Does not have known diabetic retinopathy.   Neuro/ Feet: Does not have known diabetic peripheral neuropathy.  Renal: Patient does not have known baseline CKD. She is on an ACEI/ARB at present.    3) Dyslipidemia: Patient is on Lovastatin. LDL at goal. Discussed cardiovascular benefits of statins.    F/U in 3 months     Signed electronically by: Mack Guise, MD  [facility]  [facility] [address] [place], [place] [address] Phone: [phone] FAX: [phone]   CC: [address], [place] [address] Phone: [phone]  Fax: [phone]    Return to Endocrinology clinic as below: Future Appointments  Date Time Provider [place]  03/29/2021  9:40 AM Jenny Reichmann Hunt Oris, MD [id number] None

## 2021-02-13 NOTE — Patient Instructions (Addendum)
-   Stop Glipizide  - Continue Metformin 1000 mg twice  - Start Lantus 10 units once  daily    Choose healthy, lower carb lower calorie snacks: toss salad, vegetables, cottage cheese, peanut butter, low fat cheese / string cheese, lower sodium deli meat, tuna salad or chicken salad     HOW TO TREAT LOW BLOOD SUGARS (Blood sugar LESS THAN 70 MG/DL)  Please follow the RULE OF 15 for the treatment of hypoglycemia treatment (when your (blood sugars are less than 70 mg/dL)    STEP 1: Take 15 grams of carbohydrates when your blood sugar is low, which includes:   3-4 GLUCOSE TABS  OR  3-4 OZ OF JUICE OR REGULAR SODA OR  ONE TUBE OF GLUCOSE GEL     STEP 2: RECHECK blood sugar in 15 MINUTES STEP 3: If your blood sugar is still low at the 15 minute recheck --> then, go back to STEP 1 and treat AGAIN with another 15 grams of carbohydrates.

## 2021-02-16 ENCOUNTER — Telehealth: Payer: Self-pay | Admitting: Internal Medicine

## 2021-02-16 LAB — GLUTAMIC ACID DECARBOXYLASE AUTO ABS: Glutamic Acid Decarb Ab: <5 IU/mL (ref <5)

## 2021-02-16 NOTE — Telephone Encounter (Signed)
New message    Patient has questions regarding insulin pen - asking for a call back form the nurse.

## 2021-02-16 NOTE — Telephone Encounter (Signed)
Spoken to patient and went over all the questions regarding the insulin.

## 2021-02-17 ENCOUNTER — Other Ambulatory Visit: Payer: Self-pay | Admitting: Internal Medicine

## 2021-02-17 NOTE — Telephone Encounter (Signed)
Please refill as per office routine med refill policy (all routine meds refilled for 3 mo or monthly per pt preference up to one year from last visit, then month to month grace period for 3 mo, then further med refills will have to be denied)  

## 2021-02-20 LAB — ISLET CELL AB SCREEN RFLX TO TITER: ISLET CELL ANTIBODY SCREEN: NEGATIVE

## 2021-02-21 ENCOUNTER — Encounter: Payer: Self-pay | Admitting: Internal Medicine

## 2021-02-21 DIAGNOSIS — E1165 Type 2 diabetes mellitus with hyperglycemia: Secondary | ICD-10-CM

## 2021-03-07 ENCOUNTER — Other Ambulatory Visit: Payer: Self-pay

## 2021-03-07 ENCOUNTER — Encounter: Payer: BC Managed Care – PPO | Attending: Internal Medicine | Admitting: Nutrition

## 2021-03-07 DIAGNOSIS — E1165 Type 2 diabetes mellitus with hyperglycemia: Secondary | ICD-10-CM | POA: Diagnosis present

## 2021-03-08 ENCOUNTER — Encounter: Payer: Self-pay | Admitting: Internal Medicine

## 2021-03-08 NOTE — Progress Notes (Signed)
Patient was trained on how to use the [place] 2.  We discussed the difference between sensor glucose  and blood sugar readings.  She reported good understanding of this.  We also discussed the significance of the arrows and wheat they mean. The app was downloaded to her phone and linked to [facility].  However, she was not able to start the sensor with the app.  She was instructed to call [facility] customer care to have help with this.  She agreed to do this and had no final questions.

## 2021-03-08 NOTE — Patient Instructions (Signed)
Apply a new sensor every 14 days

## 2021-03-23 ENCOUNTER — Encounter: Payer: Self-pay | Admitting: Internal Medicine

## 2021-03-27 MED ORDER — TRIAMCINOLONE ACETONIDE 55 MCG/ACT NA AERO: 2.0000 | INHALATION_SPRAY | Freq: Every day | NASAL | 12 refills | Status: AC

## 2021-03-29 ENCOUNTER — Other Ambulatory Visit: Payer: Self-pay

## 2021-03-29 ENCOUNTER — Ambulatory Visit (INDEPENDENT_AMBULATORY_CARE_PROVIDER_SITE_OTHER): Payer: BC Managed Care – PPO | Admitting: Internal Medicine

## 2021-03-29 ENCOUNTER — Encounter: Payer: Self-pay | Admitting: Internal Medicine

## 2021-03-29 ENCOUNTER — Ambulatory Visit: Payer: BC Managed Care – PPO | Admitting: Internal Medicine

## 2021-03-29 VITALS — BP 124/78 | HR 98 | Temp 98.9°F | Ht 60.0 in | Wt 110.6 lb

## 2021-03-29 DIAGNOSIS — E7800 Pure hypercholesterolemia, unspecified: Secondary | ICD-10-CM

## 2021-03-29 DIAGNOSIS — H6982 Other specified disorders of Eustachian tube, left ear: Secondary | ICD-10-CM | POA: Diagnosis not present

## 2021-03-29 DIAGNOSIS — E538 Deficiency of other specified B group vitamins: Secondary | ICD-10-CM | POA: Diagnosis not present

## 2021-03-29 DIAGNOSIS — E1165 Type 2 diabetes mellitus with hyperglycemia: Secondary | ICD-10-CM | POA: Diagnosis not present

## 2021-03-29 DIAGNOSIS — E611 Iron deficiency: Secondary | ICD-10-CM

## 2021-03-29 DIAGNOSIS — I10 Essential (primary) hypertension: Secondary | ICD-10-CM

## 2021-03-29 DIAGNOSIS — E559 Vitamin D deficiency, unspecified: Secondary | ICD-10-CM

## 2021-03-29 MED ORDER — AZELASTINE HCL 0.05 % OP SOLN: 1.0000 [drp] | Freq: Two times a day (BID) | OPHTHALMIC | 12 refills | Status: AC

## 2021-03-29 NOTE — Progress Notes (Signed)
Patient ID: Ruth Crawford, female   DOB: October 12, 1955, 65 y.o.   MRN: [id number]        Chief Complaint: follow up HTN, HLD and hyperglycemia and left ear discomfort       HPI:  Ruth Crawford is a 64 y.o. female here with Pt denies chest pain, increased sob or doe, wheezing, orthopnea, PND, increased LE swelling, palpitations, dizziness or syncope.   Pt denies polydipsia, polyuria, or new focal neruo s/s.   Pt denies fever, wt loss, night sweats, loss of appetite, or other constitutional symptoms  Does have left ear discomfort mild intemrittent with sloshing sound in the ear.  Taking B12 po now.  Iron stopped per GYN jan 2022  per pt recently.  Admits to not taking the increased glipizide ER 10 qd, wary of side effect and has f/u with endo septemeber.  To also see RD July 25.  No other neew complaints       Wt Readings from Last 3 Encounters:  03/29/21 110 lb 9.6 oz (50.2 kg)  02/13/21 113 lb 4 oz (51.4 kg)  01/09/21 114 lb (51.7 kg)   BP Readings from Last 3 Encounters:  03/29/21 124/78  02/13/21 138/86  01/09/21 (!) 142/80         Past Medical History:  Diagnosis Date   Allergy    ANXIETY    COMMON MIGRAINE    DEPRESSION    DIABETES MELLITUS, TYPE II    GERD (gastroesophageal reflux disease) 10/17/2012   HYPERLIPIDEMIA    HYPERTENSION    IDA (iron deficiency anemia)    POLYP, GALLBLADDER    RLS (restless legs syndrome)    Sickle cell trait ([place])    Past Surgical History:  Procedure Laterality Date   ABDOMINAL HYSTERECTOMY  2000   [place], 2016, 2017   s/p    reports that she has never smoked. She has never used smokeless tobacco. She reports current alcohol use. She reports that she does not use drugs. family history includes Alcohol abuse in her mother; Arthritis in her mother; Diabetes in her brother and mother; Heart attack in her father; Heart disease in her father; Liver cancer in her mother; Stroke in her father. Allergies  Allergen  Reactions   Actos [Pioglitazone]     dizziness   Demerol [Meperidine] Other (See Comments)    Per pt: unknown   Prednisone Itching and Rash   Current Outpatient Medications on File Prior to Visit  Medication Sig Dispense Refill   Aspirin-Acetaminophen-Caffeine (EXCEDRIN MIGRAINE PO) Take by mouth as needed.     Blood Glucose Monitoring Suppl (ONETOUCH VERIO) w/Device KIT Use as directed daily E11.9 1 kit 0   Continuous Blood Gluc Receiver (FREESTYLE LIBRE READER) DEVI Apply 1 Device topically every 14 (fourteen) days. E11.9 6 each 3   Continuous Blood Gluc Sensor (FREESTYLE LIBRE 2 SENSOR) MISC 1 Device by Does not apply route as directed. 6 each 3   Diclofenac Sodium 2 % SOLN Place 2 g onto the skin 2 (two) times daily. 112 g 3   diphenhydrAMINE (BENADRYL) 25 MG tablet Take 25 mg by mouth daily as needed.     DULoxetine (CYMBALTA) 30 MG capsule Take 1 capsule (30 mg total) by mouth daily. 90 capsule 3   FERREX 150 150 MG capsule Take 1 capsule by mouth once daily 90 capsule 1   gabapentin (NEURONTIN) 300 MG capsule Take 300 mg by mouth daily.  glucose blood (ONETOUCH VERIO) test strip Use as instructed daily E11.9 100 each 12   hydrocortisone (ANUSOL-HC) 2.5 % rectal cream Place 1 application rectally 2 (two) times daily. 30 g 0   hydrOXYzine (ATARAX/VISTARIL) 10 MG tablet Take 1 tablet (10 mg total) by mouth 3 (three) times daily as needed. 30 tablet 0   insulin glargine (LANTUS SOLOSTAR) 100 UNIT/ML Solostar Pen Inject 10 Units into the skin daily. 15 mL 6   Insulin Pen Needle 32G X 4 MM MISC 1 Device by Does not apply route daily. 50 each 6   Lancets MISC Use as directed daily E11.9 100 each 0   lisinopril (ZESTRIL) 20 MG tablet Take 1 tablet by mouth once daily 90 tablet 0   lovastatin (MEVACOR) 40 MG tablet Take 1 tablet by mouth once daily 90 tablet 0   meloxicam (MOBIC) 7.5 MG tablet Take 1 tablet (7.5 mg total) by mouth daily. 90 tablet 1   metFORMIN (GLUCOPHAGE) 1000 MG tablet  Take 1 tablet (1,000 mg total) by mouth 2 (two) times daily with a meal. 180 tablet 3   omeprazole (PRILOSEC) 40 MG capsule Take 1 capsule (40 mg total) by mouth daily. 90 capsule 3   rOPINIRole (REQUIP) 1 MG tablet TAKE 1 TABLET BY MOUTH AT BEDTIME 90 tablet 1   triamcinolone (NASACORT) 55 MCG/ACT AERO nasal inhaler Place 2 sprays into the nose daily. 1 each 12   vitamin B-12 (CYANOCOBALAMIN) 1000 MCG tablet Take 1 tablet (1,000 mcg total) by mouth daily. 90 tablet 3   No current facility-administered medications on file prior to visit.        ROS:  All others reviewed and negative.  Objective        PE:  BP 124/78 (BP Location: Left Arm, Patient Position: Sitting, Cuff Size: Normal)   Pulse 98   Temp 98.9 F (37.2 C) (Oral)   Ht 5' (1.524 m)   Wt 110 lb 9.6 oz (50.2 kg)   SpO2 98%   BMI 21.60 kg/m                 Constitutional: Pt appears in NAD               HENT: Head: NCAT.                Right Ear: External ear normal.                 Left Ear: External ear normal. Left TM with mild erythema, post fluid               Eyes: . Pupils are equal, round, and reactive to light. Conjunctivae and EOM are normal               Nose: without d/c or deformity               Neck: Neck supple. Gross normal ROM               Cardiovascular: Normal rate and regular rhythm.                 Pulmonary/Chest: Effort normal and breath sounds without rales or wheezing.                Abd:  Soft, NT, ND, + BS, no organomegaly               Neurological: Pt is alert. At baseline orientation, motor grossly intact  Skin: Skin is warm. No rashes, no other new lesions, LE edema - none               Psychiatric: Pt behavior is normal without agitation   Micro: none  Cardiac tracings I have personally interpreted today:  none  Pertinent Radiological findings (summarize): none   Lab Results  Component Value Date   WBC 8.7 01/09/2021   HGB 11.4 (L) 01/09/2021   HCT 34.4 (L)  01/09/2021   PLT 389.0 01/09/2021   GLUCOSE 253 (H) 01/09/2021   CHOL 143 09/28/2020   TRIG 113.0 09/28/2020   HDL 73.20 09/28/2020   LDLCALC 47 09/28/2020   ALT 15 01/09/2021   AST 13 01/09/2021   NA 136 01/09/2021   K 3.8 01/09/2021   CL 100 01/09/2021   CREATININE 0.68 01/09/2021   BUN 18 01/09/2021   CO2 29 01/09/2021   TSH 0.83 09/28/2020   INR 1.0 08/26/2018   HGBA1C 10.9 (H) 01/09/2021   MICROALBUR 1.8 09/28/2020   Assessment/Plan:  Ruth Crawford is a 65 y.o. Black or African American [2] female with  has a past medical history of Allergy, ANXIETY, COMMON MIGRAINE, DEPRESSION, DIABETES MELLITUS, TYPE II, GERD (gastroesophageal reflux disease) (10/17/2012), HYPERLIPIDEMIA, HYPERTENSION, IDA (iron deficiency anemia), POLYP, GALLBLADDER, RLS (restless legs syndrome), and Sickle cell trait ([place]).  Dysfunction of left eustachian tube Mild, for nettie pott, nasacort and mucinex bid prn,  to f/u any worsening symptoms or concerns  Essential hypertension BP Readings from Last 3 Encounters:  03/29/21 124/78  02/13/21 138/86  01/09/21 (!) 142/80   Stable, pt to continue medical treatment lisinopril   Hyperlipidemia Lab Results  Component Value Date   LDLCALC 47 09/28/2020   Stable, pt to continue current statin lovastatin 40   Iron deficiency For f/u lab next visit, pt declines labs today  Followup: No follow-ups on file.  Cathlean Cower, MD 04/01/2021 10:47 PM [facility]

## 2021-03-29 NOTE — Patient Instructions (Signed)
Ok to continue the nettie pot and nasacort as you are  Please continue all other medications as before, and refills have been done if requested.  Please have the pharmacy call with any other refills you may need.  Please continue your efforts at being more active, low cholesterol diet, and weight control.  Please keep your appointments with your specialists as you may have planned  Please make an Appointment to return in 6 months, or sooner if needed, also with Lab Appointment for testing done 3-5 days before at the FIRST FLOOR Lab (so this is for TWO appointments - please see the scheduling desk as you leave)  Due to the ongoing Covid 19 pandemic, our lab now requires an appointment for any labs done at our office.  If you need labs done and do not have an appointment, please call our office ahead of time to schedule before presenting to the lab for your testing.

## 2021-04-01 DIAGNOSIS — H6982 Other specified disorders of Eustachian tube, left ear: Secondary | ICD-10-CM | POA: Insufficient documentation

## 2021-04-01 DIAGNOSIS — H6992 Unspecified Eustachian tube disorder, left ear: Secondary | ICD-10-CM

## 2021-04-01 HISTORY — DX: Unspecified Eustachian tube disorder, left ear: H69.92

## 2021-04-01 NOTE — Assessment & Plan Note (Signed)
For f/u lab next visit, pt declines labs today

## 2021-04-01 NOTE — Assessment & Plan Note (Signed)
Mild, for nettie pott, nasacort and mucinex bid prn,  to f/u any worsening symptoms or concerns

## 2021-04-01 NOTE — Assessment & Plan Note (Signed)
Lab Results  Component Value Date   LDLCALC 47 09/28/2020   Stable, pt to continue current statin lovastatin 40

## 2021-04-01 NOTE — Assessment & Plan Note (Signed)
BP Readings from Last 3 Encounters:  03/29/21 124/78  02/13/21 138/86  01/09/21 (!) 142/80   Stable, pt to continue medical treatment lisinopril

## 2021-04-02 ENCOUNTER — Encounter: Payer: Self-pay | Admitting: Internal Medicine

## 2021-04-09 ENCOUNTER — Encounter: Payer: Self-pay | Admitting: Dietician

## 2021-04-09 ENCOUNTER — Encounter: Payer: BC Managed Care – PPO | Attending: Internal Medicine | Admitting: Dietician

## 2021-04-09 ENCOUNTER — Other Ambulatory Visit: Payer: Self-pay

## 2021-04-09 DIAGNOSIS — E1165 Type 2 diabetes mellitus with hyperglycemia: Secondary | ICD-10-CM

## 2021-04-09 NOTE — Patient Instructions (Addendum)
Choose unsweetened tea rather than sweet. Aim to be active most days.  (30 minutes 5 days per week).  If you are hungry between meals, choose a handful of nuts, raw vegetables, sugar free jello or sugar free popsicle  1/2 your plate should be non-starchy vegetables Continue to be mindful about your sweet intake. Bake rather than fried most often. Choose lean. Choose only small portions of butter.

## 2021-04-09 NOTE — Progress Notes (Signed)
Diabetes Self-Management Education  Visit Type: First/Initial  Appt. Start Time: 1535 Appt. End Time: 1645  04/09/2021  [name], identified by name and date of birth, is a 65 y.o. female with a diagnosis of Diabetes: Type 2.   ASSESSMENT Patient is here today alone. She states that she is having a problem controlling her blood sugar. She would like to learn to eat to better control her blood glucose. States that she loves sweets but has been avoiding. Fasting sensor readings are under better control since starting insulin but post meal remains high.  History includes Type 2 Diabetes (2008), anemia, depression/anxiety, GERD, HLD, HTN Medications/supplements include:  Metformin, Lantus 10 units q HS, vitamin B-12, Co Q-10, magnesium A1C 10.9% 01/09/2021 increased from 10.5% 09/28/2020 Sleep:  8-9 hours per night plus nap during the day Exercise:  Limited due to shoulders.  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  Patient lives with her husband.  He does not like any vegetables except for green beans.  This has decreased her vegetable intake. She does the shopping and cooking. 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  She helps care for her step daughter's son (58 yo) who is addicted to drugs. She reports increased amounts of stress. She has lost from 130 lbs (2021) to 110 lbs due to stress and uncontrolled diabetes. Height 5' (1.524 m), weight 110 lb (49.9 kg). Body mass index is 21.48 kg/m.   Diabetes Self-Management Education - 04/09/21 1602       Visit Information   Visit Type First/Initial      Initial Visit   Diabetes Type Type 2    Are you currently following a meal plan? No    Are you taking your medications as prescribed? Yes    Date Diagnosed 2008      Health Coping   How would you rate your overall health? Good      Psychosocial Assessment   Patient  Belief/Attitude about Diabetes Motivated to manage diabetes    Self-care barriers Other (comment)   stress   Self-management support Doctor's office    Other persons present Patient    Patient Concerns Nutrition/Meal planning;Glycemic Control;Problem Solving    Special Needs None    Preferred Learning Style No preference indicated    Learning Readiness Ready    How often do you need to have someone help you when you read instructions, pamphlets, or other written materials from your doctor or pharmacy? 1 - Never    What is the last grade level you completed in school? college      Pre-Education Assessment   Patient understands the diabetes disease and treatment process. Needs Instruction    Patient understands incorporating nutritional management into lifestyle. Needs Instruction    Patient undertands incorporating physical activity into lifestyle. Needs Instruction    Patient understands using medications safely. Needs Instruction    Patient understands monitoring blood glucose, interpreting and using results Needs Instruction    Patient understands prevention, detection, and treatment of acute complications. Needs Instruction    Patient understands prevention, detection, and treatment of chronic complications. Needs Instruction    Patient understands how to develop strategies to address psychosocial issues. Needs Instruction    Patient understands how to develop strategies to promote health/change behavior. Needs Instruction      Complications   Last HgB A1C per patient/outside source 10.9 %  01/09/2021 increased from 10.5% 09/2020   How often do you check your blood sugar? 3-4 times / week    Fasting Blood glucose range (mg/dL) 91-638    Postprandial Blood glucose range (mg/dL) >466    Number of hypoglycemic episodes per month 0    Number of hyperglycemic episodes per week 21    Have you had a dilated eye exam in the past 12 months? Yes    Have you had a dental exam in the past 12  months? Yes    Are you checking your feet? Yes    How many days per week are you checking your feet? 1      Dietary Intake   Breakfast Premier protein shake    Snack (morning) none    Lunch egg, toast, corn beef hash    Snack (afternoon) none    Dinner [employer] (chicken tender's, slaw, 1/2 biscuit, 1/2 cup mashed potatoes and gravy) OR chicken or pork or hamburger, brown rice or mashed potatoes, green beans, other steamed vegetables OR cornbread and beans    Snack (evening) occasional cookie    Beverage(s) water (4-5 bottles daily), half and half sweet tea      Exercise   Exercise Type Light (walking / raking leaves)   stationary bike or dancing   How many days per week to you exercise? 2    How many minutes per day do you exercise? 20    Total minutes per week of exercise 40      Patient Education   Previous Diabetes Education No    Disease state  Definition of diabetes, type 1 and 2, and the diagnosis of diabetes    Nutrition management  Role of diet in the treatment of diabetes and the relationship between the three main macronutrients and blood glucose level;Effects of alcohol on blood glucose and safety factors with consumption of alcohol.;Information on hints to eating out and maintain blood glucose control.;Meal options for control of blood glucose level and chronic complications.;Food label reading, portion sizes and measuring food.    Physical activity and exercise  Role of exercise on diabetes management, blood pressure control and cardiac health.    Medications Reviewed patients medication for diabetes, action, purpose, timing of dose and side effects.    Monitoring Identified appropriate SMBG and/or A1C goals.;Taught/discussed recording of test results and interpretation of SMBG.;Daily foot exams;Yearly dilated eye exam    Acute complications Taught treatment of hypoglycemia - the 15 rule.;Discussed and identified patients' treatment of hyperglycemia.    Chronic complications  Relationship between chronic complications and blood glucose control    Psychosocial adjustment Role of stress on diabetes;Identified and addressed patients feelings and concerns about diabetes      Individualized Goals (developed by patient)   Nutrition General guidelines for healthy choices and portions discussed    Physical Activity Exercise 5-7 days per week;30 minutes per day    Medications take my medication as prescribed    Monitoring  test my blood glucose as discussed    Reducing Risk increase portions of healthy fats;examine blood glucose patterns      Post-Education Assessment   Patient understands the diabetes disease and treatment process. Demonstrates understanding / competency    Patient understands incorporating nutritional management into lifestyle. Needs Review    Patient undertands incorporating physical activity into lifestyle. Demonstrates understanding / competency    Patient understands using medications safely. Demonstrates understanding / competency    Patient understands monitoring blood glucose, interpreting and using  results Demonstrates understanding / competency    Patient understands prevention, detection, and treatment of acute complications. Demonstrates understanding / competency    Patient understands prevention, detection, and treatment of chronic complications. Demonstrates understanding / competency    Patient understands how to develop strategies to address psychosocial issues. Demonstrates understanding / competency    Patient understands how to develop strategies to promote health/change behavior. Needs Review      Outcomes   Expected Outcomes Demonstrated interest in learning. Expect positive outcomes    Future DMSE 3-4 months    Program Status Not Completed             Individualized Plan for Diabetes Self-Management Training:   Learning Objective:  Patient will have a greater understanding of diabetes self-management. Patient education  plan is to attend individual and/or group sessions per assessed needs and concerns.   Plan:   Patient Instructions  Choose unsweetened tea rather than sweet. Aim to be active most days.  (30 minutes 5 days per week).  If you are hungry between meals, choose a handful of nuts, raw vegetables, sugar free jello or sugar free popsicle  1/2 your plate should be non-starchy vegetables Continue to be mindful about your sweet intake. Bake rather than fried most often. Choose lean. Choose only small portions of butter.  Expected Outcomes:  Demonstrated interest in learning. Expect positive outcomes  Education material provided: ADA - How to Thrive: A Guide for Your Journey with Diabetes, Food label handouts, and Meal plan card; eating out tips  If problems or questions, patient to contact team via:  Phone  Future DSME appointment: 3-4 months

## 2021-04-12 ENCOUNTER — Ambulatory Visit: Payer: BC Managed Care – PPO

## 2021-04-13 ENCOUNTER — Other Ambulatory Visit: Payer: Self-pay

## 2021-04-13 ENCOUNTER — Ambulatory Visit (INDEPENDENT_AMBULATORY_CARE_PROVIDER_SITE_OTHER): Payer: BC Managed Care – PPO | Admitting: *Deleted

## 2021-04-13 DIAGNOSIS — Z23 Encounter for immunization: Secondary | ICD-10-CM | POA: Diagnosis not present

## 2021-04-23 ENCOUNTER — Other Ambulatory Visit: Payer: Self-pay | Admitting: Internal Medicine

## 2021-04-23 DIAGNOSIS — K253 Acute gastric ulcer without hemorrhage or perforation: Secondary | ICD-10-CM

## 2021-04-23 NOTE — Telephone Encounter (Signed)
Please refill as per office routine med refill policy (all routine meds refilled for 3 mo or monthly per pt preference up to one year from last visit, then month to month grace period for 3 mo, then further med refills will have to be denied)  

## 2021-05-01 ENCOUNTER — Encounter: Payer: Self-pay | Admitting: Internal Medicine

## 2021-05-07 ENCOUNTER — Encounter: Payer: Self-pay | Admitting: Internal Medicine

## 2021-05-10 ENCOUNTER — Other Ambulatory Visit: Payer: Self-pay | Admitting: Internal Medicine

## 2021-05-29 ENCOUNTER — Encounter: Payer: Self-pay | Admitting: Internal Medicine

## 2021-05-29 ENCOUNTER — Other Ambulatory Visit: Payer: Self-pay | Admitting: Internal Medicine

## 2021-05-30 MED ORDER — NIRMATRELVIR/RITONAVIR (PAXLOVID)TABLET: 3.0000 | ORAL_TABLET | Freq: Two times a day (BID) | ORAL | 0 refills | Status: AC

## 2021-05-31 ENCOUNTER — Telehealth: Payer: Self-pay

## 2021-05-31 ENCOUNTER — Encounter: Payer: Self-pay | Admitting: Internal Medicine

## 2021-05-31 NOTE — Telephone Encounter (Signed)
Patient has been notified of message below. Patient agrees plan.

## 2021-05-31 NOTE — Telephone Encounter (Signed)
Patient states that her sugar was 266 this morning and last night was 364. Patient took her 10 units of insulin last night. Patient has been shaking and it won't stop. Please advise

## 2021-06-01 ENCOUNTER — Encounter: Payer: Self-pay | Admitting: Internal Medicine

## 2021-06-05 ENCOUNTER — Other Ambulatory Visit: Payer: Self-pay

## 2021-06-05 ENCOUNTER — Encounter: Payer: Self-pay | Admitting: Internal Medicine

## 2021-06-05 ENCOUNTER — Other Ambulatory Visit (HOSPITAL_BASED_OUTPATIENT_CLINIC_OR_DEPARTMENT_OTHER): Payer: Self-pay

## 2021-06-05 ENCOUNTER — Ambulatory Visit (INDEPENDENT_AMBULATORY_CARE_PROVIDER_SITE_OTHER): Payer: BC Managed Care – PPO | Admitting: Internal Medicine

## 2021-06-05 VITALS — BP 116/70 | HR 90 | Ht 60.0 in | Wt 110.0 lb

## 2021-06-05 DIAGNOSIS — E1165 Type 2 diabetes mellitus with hyperglycemia: Secondary | ICD-10-CM | POA: Diagnosis not present

## 2021-06-05 LAB — POCT GLYCOSYLATED HEMOGLOBIN (HGB A1C): Hemoglobin A1C: 9.6 % — AB (ref 4.0–5.6)

## 2021-06-05 MED ORDER — TRULICITY 0.75 MG/0.5ML ~~LOC~~ SOAJ: 0.7500 mg | SUBCUTANEOUS | 6 refills | Status: DC

## 2021-06-05 MED ORDER — LANTUS SOLOSTAR 100 UNIT/ML ~~LOC~~ SOPN: 16.0000 [IU] | PEN_INJECTOR | Freq: Every day | SUBCUTANEOUS | 6 refills | Status: DC

## 2021-06-05 NOTE — Patient Instructions (Addendum)
-   Start Trulicity 0.75 mg weekly  - Continue Metformin 1000 mg twice  - Decrease Lantus 16 units once  daily     - Please AVOID Sugar-sweetened beverages unless your sugar is less then 70 mg/dL    HOW TO TREAT LOW BLOOD SUGARS (Blood sugar LESS THAN 70 MG/DL) Please follow the RULE OF 15 for the treatment of hypoglycemia treatment (when your (blood sugars are less than 70 mg/dL)   STEP 1: Take 15 grams of carbohydrates when your blood sugar is low, which includes:  3-4 GLUCOSE TABS  OR 3-4 OZ OF JUICE OR REGULAR SODA OR ONE TUBE OF GLUCOSE GEL    STEP 2: RECHECK blood sugar in 15 MINUTES STEP 3: If your blood sugar is still low at the 15 minute recheck --> then, go back to STEP 1 and treat AGAIN with another 15 grams of carbohydrates.

## 2021-06-05 NOTE — Progress Notes (Signed)
Name: Ruth Crawford  Age/ Sex: 65 y.o., female   MRN/ DOB: [id number], 07/07/1956     PCP: Biagio Borg, MD   Reason for [facility] Evaluation: Type 2 Diabetes Mellitus  Initial Endocrine Consultative Visit: 02/13/2021    PATIENT IDENTIFIER: [name] is a 65 y.o. female with a past medical history of T2Dm, sickle cell trait, RLS, and Dyslipidemia . The patient has followed with [facility] since 02/13/2021 for consultative assistance with management of her diabetes.  DIABETIC HISTORY:  Ruth Crawford was diagnosed with DM in 2008. Her hemoglobin A1c has ranged from  6.4% in 2015, peaking at 10.9% in 2022.    On her initial visit to our clinic her A1c was  10.9% , she was on Invokana, Metformin and Glipizide. We switched Glipizide to basal insulin , Invokana was cost prohibitive. Continued Metformin     GAD-65 and Iselt cell antibodies negative   SUBJECTIVE:   During the last visit (02/13/2021): A1c 10.9% We switched Glipizide to basal insulin and continued Metformin       Today (06/05/2021): Ruth Crawford is here for a follow up on diabetes management.  She checks her blood sugars multiple  times daily, through CGM . The patient has had hypoglycemic episodes since the last clinic visit., rare.    She recently had a COVID infection  Denies nausea , vomiting  or diarrhea     HOME DIABETES REGIMEN:   Metformin 1000 mg twice  Lantus 20 units once  daily       Statin: yes ACE-I/ARB: yes    CONTINUOUS GLUCOSE MONITORING RECORD INTERPRETATION    Dates of Recording: 9/7-9/20/2022  Sensor description:frestyle libre   Results statistics:   CGM use % of time 25  Average and SD 210/37.3  Time in range 43       %  % Time Above 180 29  % Time above 250 28  % Time Below target 0    Glycemic patterns summary: high BG's during the day but worse post supper , trends down overnight   Hyperglycemic episodes  post prandial   Hypoglycemic episodes occurred  fasting   Overnight periods: trends down        DIABETIC COMPLICATIONS: Microvascular complications:   Denies: CKD, retinopathy, neuropathy Last Eye Exam: Completed 2022  Macrovascular complications:   Denies: CAD, CVA, PVD   HISTORY:  Past Medical History:  Past Medical History:  Diagnosis Date   Allergy    ANXIETY    COMMON MIGRAINE    DEPRESSION    DIABETES MELLITUS, TYPE II    GERD (gastroesophageal reflux disease) 10/17/2012   HYPERLIPIDEMIA    HYPERTENSION    IDA (iron deficiency anemia)    POLYP, GALLBLADDER    RLS (restless legs syndrome)    Sickle cell trait ([place])    Past Surgical History:  Past Surgical History:  Procedure Laterality Date   ABDOMINAL HYSTERECTOMY  2000   COLONOSCOPY     LUMBAR [place], 2016, 2017   s/p   Social History:  reports that she has never smoked. She has never used smokeless tobacco. She reports current alcohol use. She reports that she does not use drugs. Family History:  Family History  Problem Relation Age of Onset   Diabetes Mother    Liver cancer Mother    Arthritis Mother    Alcohol abuse Mother    Heart disease Father    Heart attack Father    Stroke  Father    Diabetes Brother    Colon cancer Neg Hx    Esophageal cancer Neg Hx    Rectal cancer Neg Hx    Stomach cancer Neg Hx      HOME MEDICATIONS: Allergies as of 06/05/2021       Reactions   Actos [pioglitazone]    dizziness   Demerol [meperidine] Other (See Comments)   Per pt: unknown   Prednisone Itching, Rash        Medication List        Accurate as of June 05, 2021 10:00 AM. If you have any questions, ask your nurse or doctor.          azelastine 0.05 % ophthalmic solution Commonly known as: OPTIVAR Place 1 drop into both eyes 2 (two) times daily.   co-enzyme Q-10 30 MG capsule Take 30 mg by mouth 3 (three) times daily.   Diclofenac Sodium 2 % Soln Place 2 g onto the skin 2 (two) times daily.   diphenhydrAMINE  25 MG tablet Commonly known as: BENADRYL Take 25 mg by mouth daily as needed.   DULoxetine 30 MG capsule Commonly known as: CYMBALTA Take 1 capsule (30 mg total) by mouth daily.   EXCEDRIN MIGRAINE PO Take by mouth as needed.   Ferrex 150 150 MG capsule Generic drug: iron polysaccharides Take 1 capsule by mouth once daily   FreeStyle Libre 2 Sensor Misc 1 Device by Does not apply route as directed.   FreeStyle Libre Reader Kerrin Mo Apply 1 Device topically every 14 (fourteen) days. E11.9   gabapentin 300 MG capsule Commonly known as: NEURONTIN Take 300 mg by mouth daily.   hydrocortisone 2.5 % rectal cream Commonly known as: Anusol-HC Place 1 application rectally 2 (two) times daily.   hydrOXYzine 10 MG tablet Commonly known as: ATARAX/VISTARIL Take 1 tablet (10 mg total) by mouth 3 (three) times daily as needed.   Insulin Pen Needle 32G X 4 MM Misc 1 Device by Does not apply route daily.   Lancets Misc Use as directed daily E11.9   Lantus SoloStar 100 UNIT/ML Solostar Pen Generic drug: insulin glargine Inject 10 Units into the skin daily.   lisinopril 20 MG tablet Commonly known as: ZESTRIL Take 1 tablet by mouth once daily   lovastatin 40 MG tablet Commonly known as: MEVACOR Take 1 tablet by mouth once daily   magnesium oxide 400 MG tablet Commonly known as: MAG-OX Take 400 mg by mouth daily.   meloxicam 7.5 MG tablet Commonly known as: MOBIC Take 1 tablet (7.5 mg total) by mouth daily.   metFORMIN 1000 MG tablet Commonly known as: GLUCOPHAGE Take 1 tablet (1,000 mg total) by mouth 2 (two) times daily with a meal.   omega-3 acid ethyl esters 1 g capsule Commonly known as: LOVAZA Take by mouth 2 (two) times daily.   omeprazole 40 MG capsule Commonly known as: PRILOSEC Take 1 capsule by mouth once daily   OneTouch Verio test strip Generic drug: glucose blood Use as instructed daily E11.9   OneTouch Verio w/Device Kit Use as directed daily  E11.9   rOPINIRole 1 MG tablet Commonly known as: REQUIP TAKE 1 TABLET BY MOUTH ONCE DAILY AT BEDTIME   triamcinolone 55 MCG/ACT Aero nasal inhaler Commonly known as: NASACORT Place 2 sprays into the nose daily.   vitamin B-12 1000 MCG tablet Commonly known as: CYANOCOBALAMIN Take 1 tablet (1,000 mcg total) by mouth daily.         OBJECTIVE:  Vital Signs: BP 116/70 (BP Location: Left Arm, Patient Position: Sitting, Cuff Size: Small)   Pulse 90   Ht 5' (1.524 m)   Wt 110 lb (49.9 kg)   SpO2 96%   BMI 21.48 kg/m   Wt Readings from Last 3 Encounters:  06/05/21 110 lb (49.9 kg)  04/09/21 110 lb (49.9 kg)  03/29/21 110 lb 9.6 oz (50.2 kg)     Exam: General: Pt appears well and is in NAD  Lungs: Clear with good BS bilat with no rales, rhonchi, or wheezes  Heart: RRR with normal S1 and S2 and no gallops; no murmurs; no rub  Abdomen: Normoactive bowel sounds, soft, nontender, without masses or organomegaly palpable  Extremities: No pretibial edema. No tremor. Normal strength and motion throughout. See detailed diabetic foot exam below.  Neuro: MS is good with appropriate affect, pt is alert and Ox3     DM foot exam: 02/13/2021     The skin of the feet is intact without sores or ulcerations. The pedal pulses are 2+ on right and 2+ on left. The sensation is intact to a screening 5.07, 10 gram monofilament bilaterally       DATA REVIEWED:  Lab Results  Component Value Date   HGBA1C 10.9 (H) 01/09/2021   HGBA1C 10.5 (H) 09/28/2020   HGBA1C 8.8 (A) 06/21/2020   Lab Results  Component Value Date   MICROALBUR 1.8 09/28/2020   LDLCALC 47 09/28/2020   CREATININE 0.68 01/09/2021   Lab Results  Component Value Date   MICRALBCREAT 5.5 09/28/2020     Lab Results  Component Value Date   CHOL 143 09/28/2020   HDL 73.20 09/28/2020   LDLCALC 47 09/28/2020   TRIG 113.0 09/28/2020   CHOLHDL 2 09/28/2020       Results for Ruth Crawford, Ruth Crawford (MRN [id number]) as of  06/05/2021 07:31  Ref. Range 02/13/2021 10:45  ISLET CELL ANTIBODY SCREEN Latest Ref Range: NEGATIVE  NEGATIVE   Glutamic Acid Decarb Ab <5 IU/mL <5     ASSESSMENT / PLAN / RECOMMENDATIONS:   1) Type 2 Diabetes Mellitus, Poorly controlled, With out complications - Most recent A1c of 9.6 %. Goal A1c < 7.0 %.      - A1c trending down but she she continues with severe hyperglycemia after supper, we discussed the importance of limiting CHO intake, avoiding snacks and avoiding sugar sweetened beverages ( she has sweet teat and pop corn last night with a BG 350 mg/dL) - Intolerant to Pioglitazone  - Discussed adding GLp-1 agonits, cautioned against Gi side effects, denies personal hx of pancreatitis    MEDICATIONS: Start Trulicity 8.93 mg weekly  Continue Metformin 1000 mg twice  Decrease Lantus 16 units once  daily   EDUCATION / INSTRUCTIONS: BG monitoring instructions: Patient is instructed to check her blood sugars 3 times a day, before meals . Call [facility] if: BG persistently < 70  I reviewed the Rule of 15 for the treatment of hypoglycemia in detail with the patient. Literature supplied.  2) Diabetic complications:  Eye: Does not have known diabetic retinopathy.  Neuro/ Feet: Does not have known diabetic peripheral neuropathy .  Renal: Patient does not have known baseline CKD. She   is  on an ACEI/ARB at present.     F/U in 4 months     Signed electronically by: Mack Guise, MD  [facility]  [address] [place], [place] [address] Phone: [phone]  FAX: [phone]   CC: Biagio Borg, MD [place] [address] Phone: [phone]  Fax: [phone]  Return to [facility] clinic as below: Future Appointments  Date Time Provider [place]  06/05/2021 10:10 AM Ruth Crawford, Ruth Crazier, MD [facility] [place]  06/25/2021  9:20 AM [facility] NURSE [facility] None   07/23/2021  3:30 PM Clydell Hakim, RD [place] [facility]  09/25/2021  9:00 AM [facility] LAB [facility] None  10/02/2021  9:00 AM Biagio Borg, MD [facility] None

## 2021-06-09 ENCOUNTER — Encounter: Payer: Self-pay | Admitting: Internal Medicine

## 2021-06-24 ENCOUNTER — Encounter: Payer: Self-pay | Admitting: Internal Medicine

## 2021-06-25 ENCOUNTER — Other Ambulatory Visit: Payer: Self-pay

## 2021-06-25 ENCOUNTER — Ambulatory Visit (INDEPENDENT_AMBULATORY_CARE_PROVIDER_SITE_OTHER): Payer: BC Managed Care – PPO

## 2021-06-25 DIAGNOSIS — Z23 Encounter for immunization: Secondary | ICD-10-CM | POA: Diagnosis not present

## 2021-06-25 NOTE — Progress Notes (Signed)
Medical screening examination/treatment/procedure(s) were performed by non-physician practitioner and as supervising physician I was immediately available for consultation/collaboration. I agree with above. Lorenza Winkleman, MD   

## 2021-07-16 ENCOUNTER — Other Ambulatory Visit: Payer: Self-pay

## 2021-07-16 MED ORDER — METFORMIN HCL 1000 MG PO TABS: 1000.0000 mg | ORAL_TABLET | Freq: Two times a day (BID) | ORAL | 3 refills | Status: DC

## 2021-07-16 NOTE — Telephone Encounter (Signed)
Metformin sent to [facility]

## 2021-07-19 ENCOUNTER — Other Ambulatory Visit: Payer: Self-pay

## 2021-07-19 MED ORDER — METFORMIN HCL 1000 MG PO TABS: 1000.0000 mg | ORAL_TABLET | Freq: Two times a day (BID) | ORAL | 3 refills | Status: DC

## 2021-07-19 NOTE — Telephone Encounter (Signed)
Metformin resent other medication needs to be filled by pcp

## 2021-07-23 ENCOUNTER — Encounter: Payer: BC Managed Care – PPO | Attending: Internal Medicine | Admitting: Dietician

## 2021-07-23 ENCOUNTER — Encounter: Payer: Self-pay | Admitting: Dietician

## 2021-07-23 ENCOUNTER — Other Ambulatory Visit: Payer: Self-pay

## 2021-07-23 DIAGNOSIS — E1165 Type 2 diabetes mellitus with hyperglycemia: Secondary | ICD-10-CM | POA: Insufficient documentation

## 2021-07-23 NOTE — Patient Instructions (Addendum)
Start Trulicity Please contact your doctor if you continue to have low blood glucose. Consistent meals  Be mindful about your food and drinks.  Regular soda and other sugar sweetened beverages can make your blood sugar rise.  If you have a sweet, keep the portion small and limit other carbohydrates with the meal.  If you have a high carb meal such as pizza, choose sugar free jello or sugar free popsicle for dessert  Increase your vegetables with each meal.  Choose 2 with lunch and 2-3 with dinner.

## 2021-07-23 NOTE — Progress Notes (Signed)
Diabetes Self-Management Education  Visit Type: Follow-up  Appt. Start Time: 1535 Appt. End Time: 1620  07/23/2021  [name], identified by name and date of birth, is a 65 y.o. female with a diagnosis of Diabetes:  .   ASSESSMENT Patient is here today alone.  She was last seen by myself on 04/09/2021.  She went on a Cruise in September 2022 and got Covid following. A1C has decreased. Appetite remains poor but is continuing to crave sweets.  She states if she has a small portion, she is satisfied. She has not started the Trulicity as the Pharmacy did not call her. Called pharmacy today during her appointment.  Pharmacy stated that copy is $168 per month.  Patient stated to have this filled.  Copay card provided to patient.   Instructed patient on use of Trulicity.  History includes Type 2 Diabetes (2008), anemia, depression/anxiety, GERD, HLD, HTN Medications/supplements include:  Metformin, Lantus 10 units q HS, vitamin B-12, Co Q-10, magnesium A1C 10.9% 01/09/2021 increased from 10.5% 09/28/2020 Sleep:  8-9 hours per night plus nap during the day Exercise:  Limited due to shoulders.  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   Patient lives with her husband.  He does not like any vegetables except for green beans.  This has decreased her vegetable intake. She does the shopping and cooking. 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  She helps care for her step daughter's son (91 yo) who is addicted to drugs. She reports increased amounts of stress.  Weight 114 lb (51.7 kg). Body mass index is 22.26 kg/m.   Diabetes Self-Management Education - 07/23/21 1650       Visit Information   Visit Type Follow-up      Psychosocial Assessment   Other persons present Patient      Pre-Education Assessment   Patient understands the diabetes disease and treatment process. Needs Review     Patient understands incorporating nutritional management into lifestyle. Needs Review    Patient undertands incorporating physical activity into lifestyle. Demonstrates understanding / competency    Patient understands using medications safely. Needs Review    Patient understands monitoring blood glucose, interpreting and using results Demonstrates understanding / competency    Patient understands prevention, detection, and treatment of acute complications. Demonstrates understanding / competency    Patient understands prevention, detection, and treatment of chronic complications. Demonstrates understanding / competency    Patient understands how to develop strategies to address psychosocial issues. Demonstrates understanding / competency    Patient understands how to develop strategies to promote health/change behavior. Needs Review      Complications   Last HgB A1C per patient/outside source 9.6 %   06/05/2021 decreased from, 10/9% 01/09/2021   How often do you check your blood sugar? > 4 times/day    Fasting Blood glucose range (mg/dL) <46;50-354    Postprandial Blood glucose range (mg/dL) >656    Number of hypoglycemic episodes per month 2    Can you tell when your blood sugar is low? Yes   alarm on CGM   What do you do if your blood sugar is low? drink or eat something with sugar      Dietary Intake   Breakfast Adkins or Premier protein shake, oatmeal    Lunch hamburger    Snack (afternoon) occasional rice cakes or cheez-its    Dinner Pizza, cherry cobbler and  1/2 cup ice cream    Snack (evening) popcorn or Cheez-its or cookie    Beverage(s) water, protein shake, flavored water, small regular soda on weekends, Almond milk, half and half tea      Patient Education   Previous Diabetes Education Yes (please comment)   03/2021   Nutrition management  Role of diet in the treatment of diabetes and the relationship between the three main macronutrients and blood glucose level;Meal options for  control of blood glucose level and chronic complications.    Medications Reviewed patients medication for diabetes, action, purpose, timing of dose and side effects.;Other (comment)   instructed patient on Trulicity     Individualized Goals (developed by patient)   Nutrition General guidelines for healthy choices and portions discussed    Physical Activity Exercise 5-7 days per week;30 minutes per day    Medications take my medication as prescribed    Monitoring  test my blood glucose as discussed    Reducing Risk examine blood glucose patterns;increase portions of healthy fats      Patient Self-Evaluation of Goals - Patient rates self as meeting previously set goals (% of time)   Nutrition >75%    Physical Activity >75%    Medications >75%    Monitoring >75%    Problem Solving >75%    Reducing Risk >75%    Health Coping >75%      Post-Education Assessment   Patient understands the diabetes disease and treatment process. Demonstrates understanding / competency    Patient understands incorporating nutritional management into lifestyle. Needs Review    Patient undertands incorporating physical activity into lifestyle. Demonstrates understanding / competency    Patient understands using medications safely. Needs Review    Patient understands monitoring blood glucose, interpreting and using results Needs Review    Patient understands prevention, detection, and treatment of acute complications. Demonstrates understanding / competency    Patient understands prevention, detection, and treatment of chronic complications. Demonstrates understanding / competency    Patient understands how to develop strategies to address psychosocial issues. Demonstrates understanding / competency    Patient understands how to develop strategies to promote health/change behavior. Needs Review      Outcomes   Expected Outcomes Demonstrated interest in learning. Expect positive outcomes    Future DMSE 3-4 months     Program Status Not Completed      Subsequent Visit   Since your last visit have you continued or begun to take your medications as prescribed? No    Since your last visit have you experienced any weight changes? Gain    Weight Gain (lbs) 4    Since your last visit, are you checking your blood glucose at least once a day? Yes             Individualized Plan for Diabetes Self-Management Training:   Learning Objective:  Patient will have a greater understanding of diabetes self-management. Patient education plan is to attend individual and/or group sessions per assessed needs and concerns.   Plan:   Patient Instructions  Start Trulicity Please contact your doctor if you continue to have low blood glucose. Consistent meals  Be mindful about your food and drinks.  Regular soda and other sugar sweetened beverages can make your blood sugar rise.  If you have a sweet, keep the portion small and limit other carbohydrates with the meal.  If you have a high carb meal such as pizza, choose sugar free jello or sugar free popsicle for dessert  Increase your vegetables with each meal.  Choose 2 with lunch and 2-3 with dinner.    Expected Outcomes:  Demonstrated interest in learning. Expect positive outcomes  Education material provided:   If problems or questions, patient to contact team via:  Phone  Future DSME appointment: 3-4 months

## 2021-07-26 ENCOUNTER — Encounter: Payer: Self-pay | Admitting: Internal Medicine

## 2021-07-27 ENCOUNTER — Telehealth: Payer: Self-pay | Admitting: Internal Medicine

## 2021-07-27 NOTE — Telephone Encounter (Signed)
Caller connected with [facility] 11.11.2022.  Caller states, wife was vomiting last night. Vomited several times. Sugars dropped in the 60's. Having bad leg cramps.   Caller states, wife was vomiting last night. Vomited several times. Sugars dropped in the 60's. Having bad leg cramps. Husband gave a spoonful of honey and peanut butter, and last glucose was 118. May also be having diarrhea. Afebrile.    Pt. Advised to go to ED. Caller understood and complied.

## 2021-07-29 ENCOUNTER — Encounter: Payer: Self-pay | Admitting: Internal Medicine

## 2021-08-05 ENCOUNTER — Other Ambulatory Visit: Payer: Self-pay | Admitting: Internal Medicine

## 2021-08-05 DIAGNOSIS — K253 Acute gastric ulcer without hemorrhage or perforation: Secondary | ICD-10-CM

## 2021-08-05 NOTE — Telephone Encounter (Signed)
Please refill as per office routine med refill policy (all routine meds to be refilled for 3 mo or monthly (per pt preference) up to one year from last visit, then month to month grace period for 3 mo, then further med refills will have to be denied) ? ?

## 2021-08-08 ENCOUNTER — Encounter: Payer: Self-pay | Admitting: Internal Medicine

## 2021-08-08 ENCOUNTER — Telehealth: Payer: Self-pay

## 2021-08-08 ENCOUNTER — Telehealth: Payer: Self-pay | Admitting: Internal Medicine

## 2021-08-08 MED ORDER — FAMOTIDINE 20 MG PO TABS: 20.0000 mg | ORAL_TABLET | Freq: Two times a day (BID) | ORAL | 3 refills | Status: DC

## 2021-08-08 NOTE — Telephone Encounter (Signed)
Increasing the prilosec after 40 mg often does not help much, but I did add another med that can help - pepcid 20 bid - done erx to [facility]

## 2021-08-08 NOTE — Telephone Encounter (Signed)
Connected to [facility].    Caller states that she is having chest pain and tightness. Caller states that she was seen in the ER and was cleared from any cardiac problems. Caller states that she is having shortness of breath when she does normal activities. Caller states that she has pain to the middle of her chest and pain when she swallows. Pain is pretty constant and is 9.5/10.   Advised to call EMS. Patient understands but disagrees.

## 2021-08-08 NOTE — Telephone Encounter (Signed)
Patient sent a Mychart message stating:  Have a great pain in my chest between my boobs, hurts when I swallow, chest still tight with shallow breathing. Still using inhaler.Do I need to be seen?  Call to patient to discuss symptoms. Patient states that she was seen a couple of days ago by the ER and was diagnosed with bronchitis. Per patient, prescribed an inhaler and the inhaler is not helping. Patient states that it feels like a "lump" is in her chest. Transferred patient to [facility].

## 2021-08-08 NOTE — Telephone Encounter (Signed)
Patient notified

## 2021-08-08 NOTE — Telephone Encounter (Signed)
Patient calling in  Says she is still having a lot of acid reflux.. is currently taking omeprazole once daily but wants to know if she can increase the dosage  Please call [phone]

## 2021-08-08 NOTE — Telephone Encounter (Signed)
Please see telephone encounter 08/08/21

## 2021-08-14 ENCOUNTER — Other Ambulatory Visit: Payer: Self-pay | Admitting: Internal Medicine

## 2021-08-14 DIAGNOSIS — K253 Acute gastric ulcer without hemorrhage or perforation: Secondary | ICD-10-CM

## 2021-08-14 NOTE — Telephone Encounter (Signed)
Please refill as per office routine med refill policy (all routine meds to be refilled for 3 mo or monthly (per pt preference) up to one year from last visit, then month to month grace period for 3 mo, then further med refills will have to be denied) ? ?

## 2021-08-26 ENCOUNTER — Encounter: Payer: Self-pay | Admitting: Internal Medicine

## 2021-09-14 ENCOUNTER — Encounter: Payer: Self-pay | Admitting: Internal Medicine

## 2021-09-25 ENCOUNTER — Other Ambulatory Visit: Payer: Self-pay | Admitting: Internal Medicine

## 2021-09-25 ENCOUNTER — Other Ambulatory Visit: Payer: Self-pay

## 2021-09-25 ENCOUNTER — Encounter: Payer: Self-pay | Admitting: Internal Medicine

## 2021-09-25 ENCOUNTER — Other Ambulatory Visit (INDEPENDENT_AMBULATORY_CARE_PROVIDER_SITE_OTHER): Payer: BC Managed Care – PPO

## 2021-09-25 DIAGNOSIS — E1165 Type 2 diabetes mellitus with hyperglycemia: Secondary | ICD-10-CM | POA: Diagnosis not present

## 2021-09-25 DIAGNOSIS — E538 Deficiency of other specified B group vitamins: Secondary | ICD-10-CM

## 2021-09-25 DIAGNOSIS — E559 Vitamin D deficiency, unspecified: Secondary | ICD-10-CM | POA: Diagnosis not present

## 2021-09-25 LAB — CBC WITH DIFFERENTIAL/PLATELET
Basophils Absolute: 0.2 10*3/uL — ABNORMAL HIGH (ref 0.0–0.1)
Basophils Relative: 2 % (ref 0.0–3.0)
Eosinophils Absolute: 0.7 10*3/uL (ref 0.0–0.7)
Eosinophils Relative: 8.8 % — ABNORMAL HIGH (ref 0.0–5.0)
HCT: 35.3 % — ABNORMAL LOW (ref 36.0–46.0)
Hemoglobin: 11.3 g/dL — ABNORMAL LOW (ref 12.0–15.0)
Lymphocytes Relative: 30.2 % (ref 12.0–46.0)
Lymphs Abs: 2.4 10*3/uL (ref 0.7–4.0)
MCHC: 31.9 g/dL (ref 30.0–36.0)
MCV: 79.2 fl (ref 78.0–100.0)
Monocytes Absolute: 0.5 10*3/uL (ref 0.1–1.0)
Monocytes Relative: 6.8 % (ref 3.0–12.0)
Neutro Abs: 4.2 10*3/uL (ref 1.4–7.7)
Neutrophils Relative %: 52.2 % (ref 43.0–77.0)
Platelets: 375 10*3/uL (ref 150.0–400.0)
RBC: 4.47 Mil/uL (ref 3.87–5.11)
RDW: 16.1 % — ABNORMAL HIGH (ref 11.5–15.5)
WBC: 8 10*3/uL (ref 4.0–10.5)

## 2021-09-25 LAB — VITAMIN B12: Vitamin B-12: 239 pg/mL (ref 211–911)

## 2021-09-25 LAB — HEPATIC FUNCTION PANEL
ALT: 9 U/L (ref 0–35)
AST: 12 U/L (ref 0–37)
Albumin: 4.2 g/dL (ref 3.5–5.2)
Alkaline Phosphatase: 51 U/L (ref 39–117)
Bilirubin, Direct: 0.1 mg/dL (ref 0.0–0.3)
Total Bilirubin: 0.4 mg/dL (ref 0.2–1.2)
Total Protein: 7.2 g/dL (ref 6.0–8.3)

## 2021-09-25 LAB — VITAMIN D 25 HYDROXY (VIT D DEFICIENCY, FRACTURES): VITD: 54.95 ng/mL (ref 30.00–100.00)

## 2021-09-25 LAB — MICROALBUMIN / CREATININE URINE RATIO
Creatinine,U: 65.4 mg/dL
Microalb Creat Ratio: 3.6 mg/g (ref 0.0–30.0)
Microalb, Ur: 2.3 mg/dL — ABNORMAL HIGH (ref 0.0–1.9)

## 2021-09-25 LAB — URINALYSIS, ROUTINE W REFLEX MICROSCOPIC
Bilirubin Urine: NEGATIVE
Hgb urine dipstick: NEGATIVE
Ketones, ur: NEGATIVE
Leukocytes,Ua: NEGATIVE
Nitrite: NEGATIVE
Specific Gravity, Urine: 1.01 (ref 1.000–1.030)
Total Protein, Urine: NEGATIVE
Urine Glucose: >=1000 — AB
Urobilinogen, UA: 0.2 (ref 0.0–1.0)
pH: 6.5 (ref 5.0–8.0)

## 2021-09-25 LAB — BASIC METABOLIC PANEL
BUN: 12 mg/dL (ref 6–23)
CO2: 28 mEq/L (ref 19–32)
Calcium: 9.5 mg/dL (ref 8.4–10.5)
Chloride: 104 mEq/L (ref 96–112)
Creatinine, Ser: 0.69 mg/dL (ref 0.40–1.20)
GFR: 90.96 mL/min (ref >60.00)
Glucose, Bld: 194 mg/dL — ABNORMAL HIGH (ref 70–99)
Potassium: 3.5 mEq/L (ref 3.5–5.1)
Sodium: 140 mEq/L (ref 135–145)

## 2021-09-25 LAB — HEMOGLOBIN A1C: Hgb A1c MFr Bld: 6.9 % — ABNORMAL HIGH (ref 4.6–6.5)

## 2021-09-25 LAB — LIPID PANEL
Cholesterol: 129 mg/dL (ref 0–200)
HDL: 69.6 mg/dL (ref >39.00)
LDL Cholesterol: 48 mg/dL (ref 0–99)
NonHDL: 59.62
Total CHOL/HDL Ratio: 2
Triglycerides: 56 mg/dL (ref 0.0–149.0)
VLDL: 11.2 mg/dL (ref 0.0–40.0)

## 2021-09-25 LAB — TSH: TSH: 1.05 u[IU]/mL (ref 0.35–5.50)

## 2021-09-25 MED ORDER — TRULICITY 1.5 MG/0.5ML ~~LOC~~ SOAJ: 1.5000 mg | SUBCUTANEOUS | 2 refills | Status: DC

## 2021-10-01 LAB — HM DIABETES EYE EXAM

## 2021-10-02 ENCOUNTER — Other Ambulatory Visit: Payer: Self-pay

## 2021-10-02 ENCOUNTER — Ambulatory Visit (INDEPENDENT_AMBULATORY_CARE_PROVIDER_SITE_OTHER): Payer: BC Managed Care – PPO | Admitting: Internal Medicine

## 2021-10-02 ENCOUNTER — Encounter: Payer: Self-pay | Admitting: Internal Medicine

## 2021-10-02 VITALS — BP 142/88 | HR 84 | Temp 98.9°F | Ht 60.0 in | Wt 112.0 lb

## 2021-10-02 DIAGNOSIS — E1165 Type 2 diabetes mellitus with hyperglycemia: Secondary | ICD-10-CM

## 2021-10-02 DIAGNOSIS — I10 Essential (primary) hypertension: Secondary | ICD-10-CM | POA: Diagnosis not present

## 2021-10-02 DIAGNOSIS — Z0001 Encounter for general adult medical examination with abnormal findings: Secondary | ICD-10-CM | POA: Diagnosis not present

## 2021-10-02 DIAGNOSIS — E559 Vitamin D deficiency, unspecified: Secondary | ICD-10-CM

## 2021-10-02 DIAGNOSIS — E7800 Pure hypercholesterolemia, unspecified: Secondary | ICD-10-CM

## 2021-10-02 DIAGNOSIS — Z23 Encounter for immunization: Secondary | ICD-10-CM

## 2021-10-02 DIAGNOSIS — E538 Deficiency of other specified B group vitamins: Secondary | ICD-10-CM | POA: Diagnosis not present

## 2021-10-02 NOTE — Progress Notes (Signed)
Patient ID: Ruth Crawford, female   DOB: July 27, 1956, 66 y.o.   MRN: [id number]         Chief Complaint:: wellness exam and elevated bp, dm, low b12, hld       HPI:  Ruth Crawford is a 66 y.o. female here for wellness exam; plans to see GYN for pap and mammogram and dxa soon; due for pneumovax, declines covid booster, o/w up to date.                          Also much incresaed stress with husband daughter on drugs and a grandchild living in her home.  Grandchild ill recenlty with URI, pt has no symptoms but wondering today if she is ill, but feels ok.    Taking B12 well.  Has regular f/u with endo -  Pt denies polydipsia, polyuria, or new focal neuro s/s.  BP is < 14090 at home.  Pt denies chest pain, increased sob or doe, wheezing, orthopnea, PND, increased LE swelling, palpitations, dizziness or syncope.    Pt denies fever, wt loss, night sweats, loss of appetite, or other constitutional symptoms   Pt denies fever, wt loss, night sweats, loss of appetite, or other constitutional symptoms   Wt Readings from Last 3 Encounters:  10/02/21 112 lb (50.8 kg)  07/23/21 114 lb (51.7 kg)  06/05/21 110 lb (49.9 kg)   BP Readings from Last 3 Encounters:  10/02/21 (!) 142/88  06/05/21 116/70  03/29/21 124/78   Immunization History  Administered Date(s) Administered   Influenza Split 07/22/2011, 08/18/2012, 06/10/2019   Influenza Whole 06/16/2009, 07/19/2013   Influenza,inj,Quad PF,6+ Mos 06/08/2014, 05/30/2015, 07/26/2016, 06/25/2017, 07/01/2018, 06/07/2019, 06/21/2020   Influenza-Unspecified 08/27/2021   PFIZER(Purple Top)SARS-COV-2 Vaccination 12/18/2019, 01/12/2020, 08/21/2020   PPD Test 09/03/2016   Pneumococcal Conjugate-13 08/26/2018   Pneumococcal Polysaccharide-23 11/02/2009, 04/19/2015, 10/02/2021   Td 11/02/2009   Tdap 06/21/2020   Zoster Recombinat (Shingrix) 04/13/2021, 06/25/2021   Health Maintenance Due  Topic Date Due   PAP SMEAR-Modifier  08/03/2021      Past Medical History:   Diagnosis Date   Allergy    ANXIETY    COMMON MIGRAINE    DEPRESSION    DIABETES MELLITUS, TYPE II    GERD (gastroesophageal reflux disease) 10/17/2012   HYPERLIPIDEMIA    HYPERTENSION    IDA (iron deficiency anemia)    POLYP, GALLBLADDER    RLS (restless legs syndrome)    Sickle cell trait (Ruth Crawford)    Past Surgical History:  Procedure Laterality Date   ABDOMINAL HYSTERECTOMY  2000   COLONOSCOPY     LUMBAR [place], 2016, 2017   s/p    reports that she has never smoked. She has never used smokeless tobacco. She reports current alcohol use. She reports that she does not use drugs. family history includes Alcohol abuse in her mother; Arthritis in her mother; Diabetes in her brother and mother; Heart attack in her father; Heart disease in her father; Liver cancer in her mother; Stroke in her father. Allergies  Allergen Reactions   Actos [Pioglitazone]     dizziness   Demerol [Meperidine] Other (See Comments)    Per pt: unknown   Prednisone Itching and Rash   Current Outpatient Medications on File Prior to Visit  Medication Sig Dispense Refill   Aspirin-Acetaminophen-Caffeine (EXCEDRIN MIGRAINE PO) Take by mouth as needed.     azelastine (OPTIVAR) 0.05 % ophthalmic solution Place 1 drop  into both eyes 2 (two) times daily. 6 mL 12   Blood Glucose Monitoring Suppl (ONETOUCH VERIO) w/Device KIT Use as directed daily E11.9 1 kit 0   co-enzyme Q-10 30 MG capsule Take 30 mg by mouth 3 (three) times daily.     Continuous Blood Gluc Receiver (FREESTYLE LIBRE READER) DEVI Apply 1 Device topically every 14 (fourteen) days. E11.9 6 each 3   Continuous Blood Gluc Sensor (FREESTYLE LIBRE 2 SENSOR) MISC 1 Device by Does not apply route as directed. 6 each 3   Diclofenac Sodium 2 % SOLN Place 2 g onto the skin 2 (two) times daily. 112 g 3   diphenhydrAMINE (BENADRYL) 25 MG tablet Take 25 mg by mouth daily as needed.     Dulaglutide (TRULICITY) 1.5 VF/6.4PP SOPN Inject 1.5 mg into the  skin once a week. 6 mL 2   DULoxetine (CYMBALTA) 30 MG capsule Take 1 capsule (30 mg total) by mouth daily. 90 capsule 3   famotidine (PEPCID) 20 MG tablet Take 1 tablet (20 mg total) by mouth 2 (two) times daily. 180 tablet 3   glucose blood (ONETOUCH VERIO) test strip Use as instructed daily E11.9 100 each 12   hydrocortisone (ANUSOL-HC) 2.5 % rectal cream Place 1 application rectally 2 (two) times daily. 30 g 0   hydrOXYzine (ATARAX/VISTARIL) 10 MG tablet Take 1 tablet (10 mg total) by mouth 3 (three) times daily as needed. 30 tablet 0   insulin glargine (LANTUS SOLOSTAR) 100 UNIT/ML Solostar Pen Inject 16 Units into the skin daily. 15 mL 6   Insulin Pen Needle 32G X 4 MM MISC 1 Device by Does not apply route daily. 50 each 6   Lancets MISC Use as directed daily E11.9 100 each 0   lisinopril (ZESTRIL) 20 MG tablet Take 1 tablet by mouth once daily 90 tablet 2   lovastatin (MEVACOR) 40 MG tablet Take 1 tablet by mouth once daily 90 tablet 2   magnesium oxide (MAG-OX) 400 MG tablet Take 400 mg by mouth daily.     meloxicam (MOBIC) 7.5 MG tablet Take 1 tablet (7.5 mg total) by mouth daily. 90 tablet 1   metFORMIN (GLUCOPHAGE) 1000 MG tablet Take 1 tablet (1,000 mg total) by mouth 2 (two) times daily with a meal. 180 tablet 3   omega-3 acid ethyl esters (LOVAZA) 1 g capsule Take by mouth daily.     omeprazole (PRILOSEC) 40 MG capsule Take 1 capsule (40 mg total) by mouth daily. Annual appt due in January must see provider for future refills 90 capsule 0   rOPINIRole (REQUIP) 1 MG tablet TAKE 1 TABLET BY MOUTH ONCE DAILY AT BEDTIME 90 tablet 2   triamcinolone (NASACORT) 55 MCG/ACT AERO nasal inhaler Place 2 sprays into the nose daily. 1 each 12   vitamin B-12 (CYANOCOBALAMIN) 1000 MCG tablet Take 1 tablet (1,000 mcg total) by mouth daily. 90 tablet 3   No current facility-administered medications on file prior to visit.        ROS:  All others reviewed and negative.  Objective        PE:  BP  (!) 142/88 (BP Location: Right Arm, Patient Position: Sitting, Cuff Size: Normal)    Pulse 84    Temp 98.9 F (37.2 C) (Oral)    Ht 5' (1.524 m)    Wt 112 lb (50.8 kg)    SpO2 (!) 84%    BMI 21.87 kg/m  Constitutional: Pt appears in NAD               HENT: Head: NCAT.                Right Ear: External ear normal.                 Left Ear: External ear normal.                Eyes: . Pupils are equal, round, and reactive to light. Conjunctivae and EOM are normal               Nose: without d/c or deformity               Neck: Neck supple. Gross normal ROM               Cardiovascular: Normal rate and regular rhythm.                 Pulmonary/Chest: Effort normal and breath sounds without rales or wheezing.                Abd:  Soft, NT, ND, + BS, no organomegaly               Neurological: Pt is alert. At baseline orientation, motor grossly intact               Skin: Skin is warm. No rashes, no other new lesions, LE edema - none               Psychiatric: Pt behavior is normal without agitation   Micro: none  Cardiac tracings I have personally interpreted today:  none  Pertinent Radiological findings (summarize): none   Lab Results  Component Value Date   WBC 8.0 09/25/2021   HGB 11.3 (L) 09/25/2021   HCT 35.3 (L) 09/25/2021   PLT 375.0 09/25/2021   GLUCOSE 194 (H) 09/25/2021   CHOL 129 09/25/2021   TRIG 56.0 09/25/2021   HDL 69.60 09/25/2021   LDLCALC 48 09/25/2021   ALT 9 09/25/2021   AST 12 09/25/2021   NA 140 09/25/2021   K 3.5 09/25/2021   CL 104 09/25/2021   CREATININE 0.69 09/25/2021   BUN 12 09/25/2021   CO2 28 09/25/2021   TSH 1.05 09/25/2021   INR 1.0 08/26/2018   HGBA1C 6.9 (H) 09/25/2021   MICROALBUR 2.3 (H) 09/25/2021   Assessment/Plan:  Ruth Crawford is a 66 y.o. Black or African American [2] female with  has a past medical history of Allergy, ANXIETY, COMMON MIGRAINE, DEPRESSION, DIABETES MELLITUS, TYPE II, GERD (gastroesophageal reflux  disease) (10/17/2012), HYPERLIPIDEMIA, HYPERTENSION, IDA (iron deficiency anemia), POLYP, GALLBLADDER, RLS (restless legs syndrome), and Sickle cell trait ([place]).  Encounter for well adult exam with abnormal findings Age and sex appropriate education and counseling updated with regular exercise and diet Referrals for preventative services - plans to see GYN for DXA and mammogram soon Immunizations addressed - for pneumovax, declines covid booster Smoking counseling  - none needed Evidence for depression or other mood disorder - none significant Most recent labs reviewed. I have personally reviewed and have noted: 1) the patient's medical and social history 2) The patient's current medications and supplements 3) The patient's height, weight, and BMI have been recorded in the chart   B12 deficiency Lab Results  Component Value Date   VITAMINB12 239 09/25/2021   Low normal, to restart oral replacement - b12 1000 mcg qd   Diabetes Lab  Results  Component Value Date   HGBA1C 6.9 (H) 09/25/2021   Stable, pt to continue current medical treatment trulicity, metformin, lantusm and f/u endo as planned   Essential hypertension BP Readings from Last 3 Encounters:  10/02/21 (!) 142/88  06/05/21 116/70  03/29/21 124/78   Mild uncontrolled here, pt states controlled at home, pt to continue medical treatment lisinopril   Hyperlipidemia Lab Results  Component Value Date   LDLCALC 48 09/25/2021   Stable, pt to continue current statin lovastatin 40  Followup: No follow-ups on file.  Cathlean Cower, MD 10/07/2021 1:07 PM [facility]

## 2021-10-02 NOTE — Patient Instructions (Addendum)
You had the Pneumovax pneumonia shot today  Please continue all other medications as before, and refills have been done if requested.  Please have the pharmacy call with any other refills you may need.  Please continue your efforts at being more active, low cholesterol diet, and weight control.  You are otherwise up to date with prevention measures today.  Please keep your appointments with your specialists as you may have planned  Please make an Appointment to return for your 1 year visit, or sooner if needed, with Lab testing by Appointment as well, to be done about 3-5 days before at the FIRST FLOOR Lab (so this is for TWO appointments - please see the scheduling desk as you leave)   Due to the ongoing Covid 19 pandemic, our lab now requires an appointment for any labs done at our office.  If you need labs done and do not have an appointment, please call our office ahead of time to schedule before presenting to the lab for your testing.

## 2021-10-07 NOTE — Assessment & Plan Note (Signed)
Lab Results  Component Value Date   LDLCALC 48 09/25/2021   Stable, pt to continue current statin lovastatin 40

## 2021-10-07 NOTE — Assessment & Plan Note (Addendum)
Lab Results  Component Value Date   VITAMINB12 239 09/25/2021   Low normal, to restart oral replacement - b12 1000 mcg qd

## 2021-10-07 NOTE — Assessment & Plan Note (Signed)
Age and sex appropriate education and counseling updated with regular exercise and diet Referrals for preventative services - plans to see GYN for DXA and mammogram soon Immunizations addressed - for pneumovax, declines covid booster Smoking counseling  - none needed Evidence for depression or other mood disorder - none significant Most recent labs reviewed. I have personally reviewed and have noted: 1) the patient's medical and social history 2) The patient's current medications and supplements 3) The patient's height, weight, and BMI have been recorded in the chart

## 2021-10-07 NOTE — Assessment & Plan Note (Signed)
Lab Results  Component Value Date   HGBA1C 6.9 (H) 09/25/2021   Stable, pt to continue current medical treatment trulicity, metformin, lantusm and f/u endo as planned

## 2021-10-07 NOTE — Assessment & Plan Note (Signed)
BP Readings from Last 3 Encounters:  10/02/21 (!) 142/88  06/05/21 116/70  03/29/21 124/78   Mild uncontrolled here, pt states controlled at home, pt to continue medical treatment lisinopril

## 2021-10-09 ENCOUNTER — Ambulatory Visit (INDEPENDENT_AMBULATORY_CARE_PROVIDER_SITE_OTHER): Payer: BC Managed Care – PPO | Admitting: Internal Medicine

## 2021-10-09 ENCOUNTER — Encounter: Payer: Self-pay | Admitting: Internal Medicine

## 2021-10-09 VITALS — BP 160/90 | HR 95 | Ht 60.0 in | Wt 112.0 lb

## 2021-10-09 DIAGNOSIS — E1165 Type 2 diabetes mellitus with hyperglycemia: Secondary | ICD-10-CM

## 2021-10-09 DIAGNOSIS — Z794 Long term (current) use of insulin: Secondary | ICD-10-CM

## 2021-10-09 DIAGNOSIS — E119 Type 2 diabetes mellitus without complications: Secondary | ICD-10-CM | POA: Diagnosis not present

## 2021-10-09 MED ORDER — SEMAGLUTIDE (1 MG/DOSE) 4 MG/3ML ~~LOC~~ SOPN: 1.0000 mg | PEN_INJECTOR | SUBCUTANEOUS | 2 refills | Status: DC

## 2021-10-09 NOTE — Patient Instructions (Signed)
-   Switch Trulicity to Ozempic 1 mg weekly   - Continue Metformin 1000 mg twice  - Decrease Lantus 12 units once  daily     HOW TO TREAT LOW BLOOD SUGARS (Blood sugar LESS THAN 70 MG/DL) Please follow the RULE OF 15 for the treatment of hypoglycemia treatment (when your (blood sugars are less than 70 mg/dL)   STEP 1: Take 15 grams of carbohydrates when your blood sugar is low, which includes:  3-4 GLUCOSE TABS  OR 3-4 OZ OF JUICE OR REGULAR SODA OR ONE TUBE OF GLUCOSE GEL    STEP 2: RECHECK blood sugar in 15 MINUTES STEP 3: If your blood sugar is still low at the 15 minute recheck --> then, go back to STEP 1 and treat AGAIN with another 15 grams of carbohydrates.

## 2021-10-09 NOTE — Progress Notes (Signed)
Name: Ruth Crawford  Age/ Sex: 66 y.o., female   MRN/ DOB: [id number], Feb 16, 1956     PCP: Biagio Borg, MD   Reason for Endocrinology Evaluation: Type 2 Diabetes Mellitus  Initial Endocrine Consultative Visit: 02/13/2021    PATIENT IDENTIFIER: Ruth Crawford is a 66 y.o. female with a past medical history of T2Dm, sickle cell trait, RLS, and Dyslipidemia . The patient has followed with Endocrinology clinic since 02/13/2021 for consultative assistance with management of her diabetes.      DIABETIC HISTORY:  Ruth Crawford was diagnosed with DM in 2008. Her hemoglobin A1c has ranged from  6.4% in 2015, peaking at 10.9% in 2022.    On her initial visit to our clinic her A1c was  10.9% , she was on Invokana, Metformin and Glipizide. We switched Glipizide to basal insulin , Invokana was cost prohibitive. Continued Metformin     GAD-65 and Iselt cell antibodies negative    Trulicity started 04/7866  SUBJECTIVE:   During the last visit (06/05/2021): A1c 6.7% We started trulicity, continued Metformin and decreased basal insulin    Today (10/09/2021): Ruth Crawford is here for a follow up on diabetes management.  She checks her blood sugars multiple  times daily, through CGM . The patient has had hypoglycemic episodes since the last clinic visit., overnight.     She has been noted with localized swelling at the injection site of Trulicity with itching.    Weight stable  A couple of weeks ago she had an episode of vomiting     HOME DIABETES REGIMEN:  Metformin 1000 mg twice  Lantus 13 units once  daily  Trulicity 1.5  mg weekly - not taking        Statin: yes ACE-I/ARB: yes    CONTINUOUS GLUCOSE MONITORING RECORD INTERPRETATION    Dates of Recording: 1/11-1/24/2023  Sensor description:frestyle libre   Results statistics:   CGM use % of time 43  Average and SD 178/33.6  Time in range 51   %  % Time Above 180 38  % Time above 250 11  % Time Below target 0     Glycemic patterns summary: high BG's during the day post-prandial ,worse post supper , trends down overnight   Hyperglycemic episodes  post prandial   Hypoglycemic episodes occurred fasting   Overnight periods: trends down        DIABETIC COMPLICATIONS: Microvascular complications:   Denies: CKD, retinopathy, neuropathy Last Eye Exam: Completed 2022  Macrovascular complications:   Denies: CAD, CVA, PVD   HISTORY:  Past Medical History:  Past Medical History:  Diagnosis Date   Allergy    ANXIETY    COMMON MIGRAINE    DEPRESSION    DIABETES MELLITUS, TYPE II    GERD (gastroesophageal reflux disease) 10/17/2012   HYPERLIPIDEMIA    HYPERTENSION    IDA (iron deficiency anemia)    POLYP, GALLBLADDER    RLS (restless legs syndrome)    Sickle cell trait ([place])    Past Surgical History:  Past Surgical History:  Procedure Laterality Date   ABDOMINAL HYSTERECTOMY  2000   COLONOSCOPY     LUMBAR [place], 2016, 2017   s/p   Social History:  reports that she has never smoked. She has never used smokeless tobacco. She reports current alcohol use. She reports that she does not use drugs. Family History:  Family History  Problem Relation Age of Onset   Diabetes Mother  Liver cancer Mother    Arthritis Mother    Alcohol abuse Mother    Heart disease Father    Heart attack Father    Stroke Father    Diabetes Brother    Colon cancer Neg Hx    Esophageal cancer Neg Hx    Rectal cancer Neg Hx    Stomach cancer Neg Hx      HOME MEDICATIONS: Allergies as of 10/09/2021       Reactions   Actos [pioglitazone]    dizziness   Demerol [meperidine] Other (See Comments)   Per pt: unknown   Prednisone Itching, Rash        Medication List        Accurate as of October 09, 2021 11:19 AM. If you have any questions, ask your nurse or doctor.          azelastine 0.05 % ophthalmic solution Commonly known as: OPTIVAR Place 1 drop into both eyes 2  (two) times daily.   co-enzyme Q-10 30 MG capsule Take 30 mg by mouth 3 (three) times daily.   Diclofenac Sodium 2 % Soln Place 2 g onto the skin 2 (two) times daily.   diphenhydrAMINE 25 MG tablet Commonly known as: BENADRYL Take 25 mg by mouth daily as needed.   DULoxetine 30 MG capsule Commonly known as: CYMBALTA Take 1 capsule (30 mg total) by mouth daily.   EXCEDRIN MIGRAINE PO Take by mouth as needed.   famotidine 20 MG tablet Commonly known as: Pepcid Take 1 tablet (20 mg total) by mouth 2 (two) times daily.   FreeStyle Libre 2 Sensor Misc 1 Device by Does not apply route as directed.   FreeStyle Libre Reader Ruth Crawford Apply 1 Device topically every 14 (fourteen) days. E11.9   hydrocortisone 2.5 % rectal cream Commonly known as: Anusol-HC Place 1 application rectally 2 (two) times daily.   hydrOXYzine 10 MG tablet Commonly known as: ATARAX Take 1 tablet (10 mg total) by mouth 3 (three) times daily as needed.   Insulin Pen Needle 32G X 4 MM Misc 1 Device by Does not apply route daily.   Lancets Misc Use as directed daily E11.9   Lantus SoloStar 100 UNIT/ML Solostar Pen Generic drug: insulin glargine Inject 16 Units into the skin daily.   lisinopril 20 MG tablet Commonly known as: ZESTRIL Take 1 tablet by mouth once daily   lovastatin 40 MG tablet Commonly known as: MEVACOR Take 1 tablet by mouth once daily   magnesium oxide 400 MG tablet Commonly known as: MAG-OX Take 400 mg by mouth daily.   meloxicam 7.5 MG tablet Commonly known as: MOBIC Take 1 tablet (7.5 mg total) by mouth daily.   metFORMIN 1000 MG tablet Commonly known as: GLUCOPHAGE Take 1 tablet (1,000 mg total) by mouth 2 (two) times daily with a meal.   omega-3 acid ethyl esters 1 g capsule Commonly known as: LOVAZA Take by mouth daily.   omeprazole 40 MG capsule Commonly known as: PRILOSEC Take 1 capsule (40 mg total) by mouth daily. Annual appt due in January must see provider for  future refills   OneTouch Verio test strip Generic drug: glucose blood Use as instructed daily E11.9   OneTouch Verio w/Device Kit Use as directed daily E11.9   rOPINIRole 1 MG tablet Commonly known as: REQUIP TAKE 1 TABLET BY MOUTH ONCE DAILY AT BEDTIME   triamcinolone 55 MCG/ACT Aero nasal inhaler Commonly known as: NASACORT Place 2 sprays into the nose daily.  Trulicity 1.5 TI/4.5YK Sopn Generic drug: Dulaglutide Inject 1.5 mg into the skin once a week.   vitamin B-12 1000 MCG tablet Commonly known as: CYANOCOBALAMIN Take 1 tablet (1,000 mcg total) by mouth daily.         OBJECTIVE:   Vital Signs: BP (!) 160/90 (BP Location: Left Arm, Patient Position: Sitting, Cuff Size: Small)    Pulse 95    Ht 5' (1.524 m)    Wt 112 lb (50.8 kg)    SpO2 99%    BMI 21.87 kg/m   Wt Readings from Last 3 Encounters:  10/09/21 112 lb (50.8 kg)  10/02/21 112 lb (50.8 kg)  07/23/21 114 lb (51.7 kg)     Exam: General: Pt appears well and is in NAD  Lungs: Clear with good BS bilat with no rales, rhonchi, or wheezes  Heart: RRR with normal S1 and S2 and no gallops; no murmurs; no rub  Abdomen: Normoactive bowel sounds, soft, nontender, without masses or organomegaly palpable  Extremities: No pretibial edema. No tremor. Normal strength and motion throughout. See detailed diabetic foot exam below.  Neuro: MS is good with appropriate affect, pt is alert and Ox3     DM foot exam: 02/13/2021     The skin of the feet is intact without sores or ulcerations. The pedal pulses are 2+ on right and 2+ on left. The sensation is intact to a screening 5.07, 10 gram monofilament bilaterally       DATA REVIEWED:  Lab Results  Component Value Date   HGBA1C 6.9 (H) 09/25/2021   HGBA1C 9.6 (A) 06/05/2021   HGBA1C 10.9 (H) 01/09/2021     Latest Reference Range & Units 09/25/21 09:05  Sodium 135 - 145 mEq/L 140  Potassium 3.5 - 5.1 mEq/L 3.5  Chloride 96 - 112 mEq/L 104  CO2 19 - 32  mEq/L 28  Glucose 70 - 99 mg/dL 194 (H)  BUN 6 - 23 mg/dL 12  Creatinine 0.40 - 1.20 mg/dL 0.69  Calcium 8.4 - 10.5 mg/dL 9.5  Alkaline Phosphatase 39 - 117 U/L 51  Albumin 3.5 - 5.2 g/dL 4.2  AST 0 - 37 U/L 12  ALT 0 - 35 U/L 9  Total Protein 6.0 - 8.3 g/dL 7.2  Bilirubin, Direct 0.0 - 0.3 mg/dL 0.1  Total Bilirubin 0.2 - 1.2 mg/dL 0.4  GFR >60.00 mL/min 90.96    Latest Reference Range & Units 09/25/21 09:05  TSH 0.35 - 5.50 uIU/mL 1.05    Latest Reference Range & Units 09/25/21 09:05  Creatinine,U mg/dL 65.4  Microalb, Ur 0.0 - 1.9 mg/dL 2.3 (H)  MICROALB/CREAT RATIO 0.0 - 30.0 mg/g 3.6    Results for Ruth Crawford, Ruth Crawford (MRN [id number]) as of 06/05/2021 07:31  Ref. Range 02/13/2021 10:45  ISLET CELL ANTIBODY SCREEN Latest Ref Range: NEGATIVE  NEGATIVE   Glutamic Acid Decarb Ab <5 IU/mL <5     ASSESSMENT / PLAN / RECOMMENDATIONS:   1) Type 2 Diabetes Mellitus, Optimally  controlled, Without complications - Most recent A1c of 6.9 %. Goal A1c < 7.0 %.     -I have praised the patient on improved glycemic control with her A1c down from 9.6% to 6.9% - Intolerant to Pioglitazone  -Unfortunately she has local allergic reaction to Trulicity injections, will switch to Ozempic -Will reduce basal insulin due to fasting hypoglycemia  MEDICATIONS: Switch Trulicity to Ozempic 1 mg weekly  Continue Metformin 1000 mg twice  Decrease Lantus 12 units once  daily  EDUCATION / INSTRUCTIONS: BG monitoring instructions: Patient is instructed to check her blood sugars 3 times a day, before meals . Call [facility] if: BG persistently < 70  I reviewed the Rule of 15 for the treatment of hypoglycemia in detail with the patient. Literature supplied.  2) Diabetic complications:  Eye: Does not have known diabetic retinopathy.  Neuro/ Feet: Does not have known diabetic peripheral neuropathy .  Renal: Patient does not have known baseline CKD. She   is  on an ACEI/ARB at present.      F/U in 4 months     Signed electronically by: Mack Guise, MD  [facility]  [facility] [address] [place], [place] [address] Phone: [phone] FAX: [phone]   CC: Biagio [address], [place] [address] Phone: [phone]  Fax: [phone]  Return to Endocrinology clinic as below: Future Appointments  Date Time Provider [place]  10/22/2021  2:00 PM Clydell Hakim, RD [facility]  10/07/2022  1:00 PM Biagio Borg, MD [id number] None

## 2021-10-15 ENCOUNTER — Encounter: Payer: Self-pay | Admitting: Internal Medicine

## 2021-10-16 ENCOUNTER — Other Ambulatory Visit (HOSPITAL_COMMUNITY): Payer: Self-pay

## 2021-10-16 MED ORDER — RYBELSUS 7 MG PO TABS: 7.0000 mg | ORAL_TABLET | Freq: Every day | ORAL | 11 refills | Status: DC

## 2021-10-22 ENCOUNTER — Encounter: Payer: BC Managed Care – PPO | Attending: Internal Medicine | Admitting: Dietician

## 2021-10-22 ENCOUNTER — Other Ambulatory Visit: Payer: Self-pay

## 2021-10-22 ENCOUNTER — Encounter: Payer: Self-pay | Admitting: Dietician

## 2021-10-22 DIAGNOSIS — E1165 Type 2 diabetes mellitus with hyperglycemia: Secondary | ICD-10-CM | POA: Insufficient documentation

## 2021-10-22 NOTE — Patient Instructions (Addendum)
Blood glucose goals: Fasting (first thing in the morning) 80-130 2 hours after you eat - less than 180  Great job on changes made! Continue to choose water most often. Continue to be active.

## 2021-10-22 NOTE — Progress Notes (Signed)
Diabetes Self-Management Education  Visit Type: Follow-up  Appt. Start Time: 1425 Appt. End Time: 1500  10/26/2021  [name], identified by name and date of birth, is a 66 y.o. female with a diagnosis of Diabetes:  .   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  She was last seen by this RD on 07/23/2021.  Continues to use the [employer].  Readings 119 this am and 189 now. Time in Range for the past 7 days 67% She has hypoglycemia occasionally 68.  And treats with 8 oz of juice.  Discussed to treat with only 4 oz.  She states if she drinks too little juice, she gets hypoglycemia again in 4 hours.  Discussed to have a small snack or meal with protein to help stabilize.  She is not taking the Rybelsus due to expense Trulicity - allergy with golf ball size welts and itching Intolerant to Pioglitazone Continues to take Lantus 13 units q HS States that she restarted vitamin B-12 and iron. And continues the Omega 3  States that her appetite is low except for sweets.  History includes Type 2 Diabetes (2008), anemia, depression/anxiety, GERD, HLD, HTN Medications/supplements include:  Metformin, Lantus 13 units q HS, vitamin B-12, Co Q-10, magnesium, vitmain D, Vitamin B-12, iron A1C 6.9% decreased from 9.6% 06/05/2021, 10.9% 01/09/2021,10.5% 09/28/2020 Vitamin B-12 239 on 09/25/2021 Sleep:  8-9 hours per night plus nap during the day Exercise:  Limited due to shoulders.  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   Patient lives with her husband.  He does not like any vegetables except for green beans.  This has decreased her vegetable intake. She does the shopping and cooking. 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  She helps care for her step daughter's son (72 yo) The step daughter is addicted to drugs. She reports increased amounts of stress.  Weight  113 lb (51.3 kg). Body mass index is 22.07 kg/m.   Diabetes Self-Management Education - 10/26/21 0900       Visit Information   Visit Type Follow-up      Psychosocial Assessment   Other persons present Patient      Pre-Education Assessment   Patient understands the diabetes disease and treatment process. Needs Review    Patient understands incorporating nutritional management into lifestyle. Needs Review    Patient undertands incorporating physical activity into lifestyle. Demonstrates understanding / competency    Patient understands using medications safely. Needs Review    Patient understands monitoring blood glucose, interpreting and using results Demonstrates understanding / competency    Patient understands prevention, detection, and treatment of acute complications. Demonstrates understanding / competency    Patient understands prevention, detection, and treatment of chronic complications. Demonstrates understanding / competency    Patient understands how to develop strategies to address psychosocial issues. Demonstrates understanding / competency    Patient understands how to develop strategies to promote health/change behavior. Needs Review      Complications   Last HgB A1C per patient/outside source 6.9 %   09/25/2021   How often do you check your blood sugar? > 4 times/day    Fasting Blood glucose range (mg/dL) 98-264    Postprandial Blood glucose range (mg/dL) 158-309;407-680    Number of hypoglycemic episodes per month 2    What do you do if your blood sugar is low?  drinks 8 oz juice      Dietary Intake   Breakfast instant oatmeal OR eggs, bacon or sausage and toast OR cereal (Special K or raisin bran or cornflakes or Captain Crunch with 2% milk    Lunch sandwich on honey wheat OR Hamburger with lettuce and tomato, cucumers OR ramen noodles    Snack (afternoon) Doritos or mini candy bar with Almonds (2), fruit cup    Dinner meat starch, vegetable or Last night ate out  - [employer] fried chicken and fries    Beverage(s) water, occasional flavor pack      Exercise   Exercise Type Light (walking / raking leaves)      Patient Education   Previous Diabetes Education Yes (please comment)   07/2021   Nutrition management  Meal options for control of blood glucose level and chronic complications.;Role of diet in the treatment of diabetes and the relationship between the three main macronutrients and blood glucose level    Physical activity and exercise  Role of exercise on diabetes management, blood pressure control and cardiac health.    Medications Reviewed patients medication for diabetes, action, purpose, timing of dose and side effects.    Monitoring Identified appropriate SMBG and/or A1C goals.    Acute complications Taught treatment of hypoglycemia - the 15 rule.    Psychosocial adjustment Worked with patient to identify barriers to care and solutions;Identified and addressed patients feelings and concerns about diabetes;Role of stress on diabetes      Individualized Goals (developed by patient)   Nutrition General guidelines for healthy choices and portions discussed    Physical Activity Exercise 5-7 days per week;60 minutes per day    Medications take my medication as prescribed    Monitoring  test my blood glucose as discussed    Reducing Risk increase portions of healthy fats;examine blood glucose patterns      Patient Self-Evaluation of Goals - Patient rates self as meeting previously set goals (% of time)   Nutrition 50 - 75 %    Physical Activity >75%    Medications >75%    Monitoring >75%    Problem Solving >75%    Reducing Risk >75%    Health Coping >75%      Post-Education Assessment   Patient understands the diabetes disease and treatment process. Demonstrates understanding / competency    Patient understands incorporating nutritional management into lifestyle. Needs Review    Patient undertands incorporating physical activity into  lifestyle. Demonstrates understanding / competency    Patient understands using medications safely. Demonstrates understanding / competency    Patient understands monitoring blood glucose, interpreting and using results Demonstrates understanding / competency    Patient understands prevention, detection, and treatment of acute complications. Demonstrates understanding / competency    Patient understands prevention, detection, and treatment of chronic complications. Demonstrates understanding / competency    Patient understands how to develop strategies to address psychosocial issues. Demonstrates understanding / competency    Patient understands how to develop strategies to promote health/change behavior. Demonstrates understanding / competency      Outcomes   Expected Outcomes Demonstrated interest in learning. Expect positive outcomes    Future DMSE 3-4 months    Program Status Not Completed      Subsequent Visit   Since your last visit have you continued or begun to take your medications as prescribed? Yes    Since your last visit have you experienced any weight changes? No change    Since your  last visit, are you checking your blood glucose at least once a day? Yes             Individualized Plan for Diabetes Self-Management Training:   Learning Objective:  Patient will have a greater understanding of diabetes self-management. Patient education plan is to attend individual and/or group sessions per assessed needs and concerns.   Plan:   Patient Instructions  Blood glucose goals: Fasting (first thing in the morning) 80-130 2 hours after you eat - less than 180  Great job on changes made! Continue to choose water most often. Continue to be active.  Expected Outcomes:  Demonstrated interest in learning. Expect positive outcomes  Education material provided:   If problems or questions, patient to contact team via:  Phone  Future DSME appointment: 3-4 months

## 2021-10-31 ENCOUNTER — Encounter: Payer: Self-pay | Admitting: Internal Medicine

## 2021-11-05 ENCOUNTER — Encounter: Payer: Self-pay | Admitting: Internal Medicine

## 2021-11-05 MED ORDER — EMPAGLIFLOZIN 10 MG PO TABS: 10.0000 mg | ORAL_TABLET | Freq: Every day | ORAL | 6 refills | Status: DC

## 2021-12-19 ENCOUNTER — Other Ambulatory Visit: Payer: Self-pay | Admitting: Internal Medicine

## 2021-12-19 DIAGNOSIS — K253 Acute gastric ulcer without hemorrhage or perforation: Secondary | ICD-10-CM

## 2021-12-19 NOTE — Telephone Encounter (Signed)
Please refill as per office routine med refill policy (all routine meds to be refilled for 3 mo or monthly (per pt preference) up to one year from last visit, then month to month grace period for 3 mo, then further med refills will have to be denied) ? ?

## 2022-01-21 ENCOUNTER — Ambulatory Visit: Payer: BC Managed Care – PPO | Admitting: Dietician

## 2022-01-22 ENCOUNTER — Other Ambulatory Visit: Payer: Self-pay | Admitting: Internal Medicine

## 2022-01-23 ENCOUNTER — Other Ambulatory Visit: Payer: Self-pay | Admitting: Internal Medicine

## 2022-01-25 ENCOUNTER — Other Ambulatory Visit: Payer: Self-pay | Admitting: Internal Medicine

## 2022-02-05 ENCOUNTER — Ambulatory Visit: Payer: BC Managed Care – PPO | Admitting: Internal Medicine

## 2022-03-06 ENCOUNTER — Other Ambulatory Visit: Payer: Self-pay | Admitting: Internal Medicine

## 2022-03-06 NOTE — Telephone Encounter (Signed)
Please refill as per office routine med refill policy (all routine meds to be refilled for 3 mo or monthly (per pt preference) up to one year from last visit, then month to month grace period for 3 mo, then further med refills will have to be denied) ? ?

## 2022-03-12 ENCOUNTER — Encounter: Payer: Self-pay | Admitting: Internal Medicine

## 2022-03-13 MED ORDER — ONDANSETRON HCL 4 MG PO TABS: 4.0000 mg | ORAL_TABLET | Freq: Three times a day (TID) | ORAL | 0 refills | Status: DC | PRN

## 2022-03-13 NOTE — Telephone Encounter (Signed)
Scheduled pt an appt for 6/30 w/PCP

## 2022-03-15 ENCOUNTER — Ambulatory Visit (INDEPENDENT_AMBULATORY_CARE_PROVIDER_SITE_OTHER): Payer: BC Managed Care – PPO | Admitting: Internal Medicine

## 2022-03-15 ENCOUNTER — Encounter: Payer: Self-pay | Admitting: Internal Medicine

## 2022-03-15 VITALS — BP 150/82 | HR 76 | Temp 98.8°F | Ht 60.0 in | Wt 119.6 lb

## 2022-03-15 DIAGNOSIS — R197 Diarrhea, unspecified: Secondary | ICD-10-CM | POA: Diagnosis not present

## 2022-03-15 DIAGNOSIS — I10 Essential (primary) hypertension: Secondary | ICD-10-CM

## 2022-03-15 DIAGNOSIS — E1165 Type 2 diabetes mellitus with hyperglycemia: Secondary | ICD-10-CM

## 2022-03-15 LAB — URINALYSIS, ROUTINE W REFLEX MICROSCOPIC
Bilirubin Urine: NEGATIVE
Hgb urine dipstick: NEGATIVE
Ketones, ur: NEGATIVE
Leukocytes,Ua: NEGATIVE
Nitrite: NEGATIVE
Specific Gravity, Urine: 1.01 (ref 1.000–1.030)
Total Protein, Urine: NEGATIVE
Urine Glucose: >=1000 — AB
Urobilinogen, UA: 0.2 (ref 0.0–1.0)
pH: 5.5 (ref 5.0–8.0)

## 2022-03-15 LAB — CBC WITH DIFFERENTIAL/PLATELET
Basophils Absolute: 0.2 10*3/uL — ABNORMAL HIGH (ref 0.0–0.1)
Basophils Relative: 1.7 % (ref 0.0–3.0)
Eosinophils Absolute: 0.8 10*3/uL — ABNORMAL HIGH (ref 0.0–0.7)
Eosinophils Relative: 8.9 % — ABNORMAL HIGH (ref 0.0–5.0)
HCT: 33.3 % — ABNORMAL LOW (ref 36.0–46.0)
Hemoglobin: 10.7 g/dL — ABNORMAL LOW (ref 12.0–15.0)
Lymphocytes Relative: 31.5 % (ref 12.0–46.0)
Lymphs Abs: 2.8 10*3/uL (ref 0.7–4.0)
MCHC: 32.2 g/dL (ref 30.0–36.0)
MCV: 79.2 fl (ref 78.0–100.0)
Monocytes Absolute: 0.7 10*3/uL (ref 0.1–1.0)
Monocytes Relative: 8.2 % (ref 3.0–12.0)
Neutro Abs: 4.5 10*3/uL (ref 1.4–7.7)
Neutrophils Relative %: 49.7 % (ref 43.0–77.0)
Platelets: 399 10*3/uL (ref 150.0–400.0)
RBC: 4.2 Mil/uL (ref 3.87–5.11)
RDW: 15.9 % — ABNORMAL HIGH (ref 11.5–15.5)
WBC: 9 10*3/uL (ref 4.0–10.5)

## 2022-03-15 LAB — BASIC METABOLIC PANEL
BUN: 16 mg/dL (ref 6–23)
CO2: 30 mEq/L (ref 19–32)
Calcium: 9.3 mg/dL (ref 8.4–10.5)
Chloride: 103 mEq/L (ref 96–112)
Creatinine, Ser: 0.79 mg/dL (ref 0.40–1.20)
GFR: 78.14 mL/min (ref >60.00)
Glucose, Bld: 191 mg/dL — ABNORMAL HIGH (ref 70–99)
Potassium: 4.1 mEq/L (ref 3.5–5.1)
Sodium: 139 mEq/L (ref 135–145)

## 2022-03-15 LAB — HEPATIC FUNCTION PANEL
ALT: 8 U/L (ref 0–35)
AST: 11 U/L (ref 0–37)
Albumin: 4 g/dL (ref 3.5–5.2)
Alkaline Phosphatase: 56 U/L (ref 39–117)
Bilirubin, Direct: 0 mg/dL (ref 0.0–0.3)
Total Bilirubin: 0.3 mg/dL (ref 0.2–1.2)
Total Protein: 7 g/dL (ref 6.0–8.3)

## 2022-03-15 LAB — HEMOGLOBIN A1C: Hgb A1c MFr Bld: 8.9 % — ABNORMAL HIGH (ref 4.6–6.5)

## 2022-03-15 MED ORDER — ONDANSETRON HCL 4 MG PO TABS
4.0000 mg | ORAL_TABLET | Freq: Three times a day (TID) | ORAL | 0 refills | Status: DC | PRN
Start: 2022-03-15 — End: 2023-10-09

## 2022-03-15 MED ORDER — DIPHENOXYLATE-ATROPINE 2.5-0.025 MG PO TABS: 1.0000 | ORAL_TABLET | Freq: Four times a day (QID) | ORAL | 0 refills | Status: DC | PRN

## 2022-03-15 NOTE — Progress Notes (Unsigned)
Patient ID: Ruth Crawford, female   DOB: 09/05/1956, 66 y.o.   MRN: [id number]        Chief Complaint: follow up acute diarrhea, low sugar, elevated BP       HPI:  Ruth Crawford is a 66 y.o. female here with c/o 1 wk onset watery diarreal stools, taste off and tongue burning, covid neg, crampy pains diffuse mild intermittent, with nausea but no vomiting, blood and no sick contacts.  Has felt warm, not sure about fever, and no chills.  CBg was 66 this am but did not eat well yesterday.  Pt denies chest pain, increased sob or doe, wheezing, orthopnea, PND, increased LE swelling, palpitations, dizziness or syncope.   Pt denies polydipsia, polyuria, or new focal neuro s/s.         Wt Readings from Last 3 Encounters:  03/15/22 119 lb 9.6 oz (54.3 kg)  10/26/21 113 lb (51.3 kg)  10/09/21 112 lb (50.8 kg)   BP Readings from Last 3 Encounters:  03/15/22 (!) 150/82  10/09/21 (!) 160/90  10/02/21 (!) 142/88         Past Medical History:  Diagnosis Date   Allergy    ANXIETY    COMMON MIGRAINE    DEPRESSION    DIABETES MELLITUS, TYPE II    GERD (gastroesophageal reflux disease) 10/17/2012   HYPERLIPIDEMIA    HYPERTENSION    IDA (iron deficiency anemia)    POLYP, GALLBLADDER    RLS (restless legs syndrome)    Sickle cell trait ([facility])    Past Surgical History:  Procedure Laterality Date   ABDOMINAL HYSTERECTOMY  2000   COLONOSCOPY     LUMBAR [place], 2016, 2017   s/p    reports that she has never smoked. She has never used smokeless tobacco. She reports current alcohol use. She reports that she does not use drugs. family history includes Alcohol abuse in her mother; Arthritis in her mother; Diabetes in her brother and mother; Heart attack in her father; Heart disease in her father; Liver cancer in her mother; Stroke in her father. Allergies  Allergen Reactions   Actos [Pioglitazone]     dizziness   Demerol [Meperidine] Other (See Comments)    Per pt: unknown   Trulicity  [Dulaglutide] Dermatitis   Prednisone Itching and Rash   Current Outpatient Medications on File Prior to Visit  Medication Sig Dispense Refill   Aspirin-Acetaminophen-Caffeine (EXCEDRIN MIGRAINE PO) Take by mouth as needed.     azelastine (OPTIVAR) 0.05 % ophthalmic solution Place 1 drop into both eyes 2 (two) times daily. 6 mL 12   Blood Glucose Monitoring Suppl (ONETOUCH VERIO) w/Device KIT Use as directed daily E11.9 1 kit 0   co-enzyme Q-10 30 MG capsule Take 30 mg by mouth 3 (three) times daily.     Continuous Blood Gluc Receiver (FREESTYLE LIBRE READER) DEVI Apply 1 Device topically every 14 (fourteen) days. E11.9 6 each 3   Continuous Blood Gluc Sensor (FREESTYLE LIBRE 2 SENSOR) MISC USE ONE AS DIRECTED 6 each 2   Diclofenac Sodium 2 % SOLN Place 2 g onto the skin 2 (two) times daily. 112 g 3   diphenhydrAMINE (BENADRYL) 25 MG tablet Take 25 mg by mouth daily as needed.     DULoxetine (CYMBALTA) 30 MG capsule Take 1 capsule by mouth once daily 90 capsule 2   famotidine (PEPCID) 20 MG tablet Take 1 tablet (20 mg total) by mouth 2 (two) times daily. [place]  tablet 3   glucose blood (ONETOUCH VERIO) test strip Use as instructed daily E11.9 100 each 12   hydrocortisone (ANUSOL-HC) 2.5 % rectal cream Place 1 application rectally 2 (two) times daily. 30 g 0   hydrOXYzine (ATARAX/VISTARIL) 10 MG tablet Take 1 tablet (10 mg total) by mouth 3 (three) times daily as needed. 30 tablet 0   Lancets MISC Use as directed daily E11.9 100 each 0   lisinopril (ZESTRIL) 20 MG tablet Take 1 tablet by mouth once daily 90 tablet 2   lovastatin (MEVACOR) 40 MG tablet Take 1 tablet by mouth once daily 90 tablet 2   magnesium oxide (MAG-OX) 400 MG tablet Take 400 mg by mouth daily.     meloxicam (MOBIC) 7.5 MG tablet Take 1 tablet (7.5 mg total) by mouth daily. 90 tablet 1   metFORMIN (GLUCOPHAGE) 1000 MG tablet Take 1 tablet (1,000 mg total) by mouth 2 (two) times daily with a meal. 180 tablet 3   omega-3 acid  ethyl esters (LOVAZA) 1 g capsule Take by mouth daily.     omeprazole (PRILOSEC) 40 MG capsule TAKE 1 CAPSULE BY MOUTH ONCE DAILY . APPOINTMENT REQUIRED FOR FUTURE REFILLS 90 capsule 3   RELION PEN NEEDLES 32G X 4 MM MISC USE 1 ONCE DAILY 50 each 1   rOPINIRole (REQUIP) 1 MG tablet TAKE 1 TABLET BY MOUTH ONCE DAILY AT BEDTIME 90 tablet 2   triamcinolone (NASACORT) 55 MCG/ACT AERO nasal inhaler Place 2 sprays into the nose daily. 1 each 12   vitamin B-12 (CYANOCOBALAMIN) 1000 MCG tablet Take 1 tablet (1,000 mcg total) by mouth daily. 90 tablet 3   No current facility-administered medications on file prior to visit.        ROS:  All others reviewed and negative.  Objective        PE:  BP (!) 150/82 (BP Location: Right Arm, Patient Position: Sitting, Cuff Size: Normal)   Pulse 76   Temp 98.8 F (37.1 C) (Oral)   Ht 5' (1.524 m)   Wt 119 lb 9.6 oz (54.3 kg)   SpO2 99%   BMI 23.36 kg/m                 Constitutional: Pt appears in NAD, non toxic               HENT: Head: NCAT.                Right Ear: External ear normal.                 Left Ear: External ear normal.                Eyes: . Pupils are equal, round, and reactive to light. Conjunctivae and EOM are normal               Nose: without d/c or deformity               Neck: Neck supple. Gross normal ROM               Cardiovascular: Normal rate and regular rhythm.                 Pulmonary/Chest: Effort normal and breath sounds without rales or wheezing.                Abd:  Soft, NT, ND, + BS, no organomegaly - benign  Neurological: Pt is alert. At baseline orientation, motor grossly intact               Skin: Skin is warm. No rashes, no other new lesions, LE edema - none               Psychiatric: Pt behavior is normal without agitation   Micro: none  Cardiac tracings I have personally interpreted today:  none  Pertinent Radiological findings (summarize): none   Lab Results  Component Value Date   WBC  9.0 03/15/2022   HGB 10.7 (L) 03/15/2022   HCT 33.3 (L) 03/15/2022   PLT 399.0 03/15/2022   GLUCOSE 191 (H) 03/15/2022   CHOL 129 09/25/2021   TRIG 56.0 09/25/2021   HDL 69.60 09/25/2021   LDLCALC 48 09/25/2021   ALT 8 03/15/2022   AST 11 03/15/2022   NA 139 03/15/2022   K 4.1 03/15/2022   CL 103 03/15/2022   CREATININE 0.79 03/15/2022   BUN 16 03/15/2022   CO2 30 03/15/2022   TSH 1.05 09/25/2021   INR 1.0 08/26/2018   HGBA1C 8.9 (H) 03/15/2022   MICROALBUR 2.3 (H) 09/25/2021   Assessment/Plan:  Ruth Crawford is a 66 y.o. Black or African American [2] female with  has a past medical history of Allergy, ANXIETY, COMMON MIGRAINE, DEPRESSION, DIABETES MELLITUS, TYPE II, GERD (gastroesophageal reflux disease) (10/17/2012), HYPERLIPIDEMIA, HYPERTENSION, IDA (iron deficiency anemia), POLYP, GALLBLADDER, RLS (restless legs syndrome), and Sickle cell trait ([place]).  Diabetes With lower sugars with less po intake, for now to reduce metformin to 1000 mg qam only, cont all other and f/u with endo as planned next wk, also for a1c with labs  Diarrhea C/w likely viral AGE - for cbc, bmp, zofran prn, lomotil prn but may be starting to improve anyway with less symptoms,  to f/u any worsening symptoms or concerns  Essential hypertension BP Readings from Last 3 Encounters:  03/15/22 (!) 150/82  10/09/21 (!) 160/90  10/02/21 (!) 142/88   Uncontrolled, likely reactive today, pt to continue medical treatment lisinopril 20 mg qd as declines change  Followup: Return if symptoms worsen or fail to improve.  Cathlean Cower, MD 03/17/2022 3:55 PM [facility]

## 2022-03-15 NOTE — Patient Instructions (Signed)
Please take all new medication as prescribed - the nausea medication, and lomoil for diarrhea if needed  Please continue all other medications as before, and refills have been done if requested.  Please have the pharmacy call with any other refills you may need.  Please continue your efforts at being more active, low cholesterol diabetic diet, and weight control.  Please keep your appointments with your specialists as you may have planned - Endo next week  Please go to the LAB at the blood drawing area for the tests to be done, including the A1c today  You will be contacted by phone if any changes need to be made immediately.  Otherwise, you will receive a letter about your results with an explanation, but please check with MyChart first.  Please remember to sign up for MyChart if you have not done so, as this will be important to you in the future with finding out test results, communicating by private email, and scheduling acute appointments online when needed.

## 2022-03-17 ENCOUNTER — Encounter: Payer: Self-pay | Admitting: Internal Medicine

## 2022-03-17 DIAGNOSIS — R197 Diarrhea, unspecified: Secondary | ICD-10-CM | POA: Insufficient documentation

## 2022-03-17 NOTE — Assessment & Plan Note (Signed)
With lower sugars with less po intake, for now to reduce metformin to 1000 mg qam only, cont all other and f/u with endo as planned next wk, also for a1c with labs

## 2022-03-17 NOTE — Assessment & Plan Note (Signed)
C/w likely viral AGE - for cbc, bmp, zofran prn, lomotil prn but may be starting to improve anyway with less symptoms,  to f/u any worsening symptoms or concerns

## 2022-03-17 NOTE — Assessment & Plan Note (Signed)
BP Readings from Last 3 Encounters:  03/15/22 (!) 150/82  10/09/21 (!) 160/90  10/02/21 (!) 142/88   Uncontrolled, likely reactive today, pt to continue medical treatment lisinopril 20 mg qd as declines change

## 2022-03-19 ENCOUNTER — Other Ambulatory Visit: Payer: Self-pay | Admitting: Internal Medicine

## 2022-03-19 DIAGNOSIS — E119 Type 2 diabetes mellitus without complications: Secondary | ICD-10-CM

## 2022-03-20 ENCOUNTER — Ambulatory Visit: Payer: BC Managed Care – PPO | Admitting: Internal Medicine

## 2022-03-20 NOTE — Progress Notes (Deleted)
Name: [name]  Age/ Sex: 66 y.o., female   MRN/ DOB: [id number], [birth year]     PCP: Biagio Borg, MD   Reason for Endocrinology Evaluation: Type 2 Diabetes Mellitus  Initial Endocrine Consultative Visit: 02/13/2021    PATIENT IDENTIFIER: [name] is a 66 y.o. female with a past medical history of T2Dm, sickle cell trait, RLS, and Dyslipidemia . The patient has followed with Endocrinology clinic since 02/13/2021 for consultative assistance with management of her diabetes.      DIABETIC HISTORY:  Ruth Crawford was diagnosed with DM in 2008. Her hemoglobin A1c has ranged from  6.4% in 2015, peaking at 10.9% in 2022.    On her initial visit to our clinic her A1c was  10.9% , she was on Invokana, Metformin and Glipizide. We switched Glipizide to basal insulin , Invokana was cost prohibitive. Continued Metformin     GAD-65 and Iselt cell antibodies negative    Trulicity started 04/6766 but developed local reaction, started ozempic 09/2021  SUBJECTIVE:   During the last visit (10/09/2021): A1c 6.9% continued Metformin and lantus and started ozempic     Today (03/20/2022): Ruth Crawford is here for a follow up on diabetes management.  She checks her blood sugars multiple  times daily, through CGM . The patient has had hypoglycemic episodes since the last clinic visit., overnight.     She has been noted with localized swelling at the injection site of Trulicity with itching.    Weight stable  A couple of weeks ago she had an episode of vomiting     HOME DIABETES REGIMEN:  Metformin 1000 mg twice  Lantus 15 units once  daily  Ozempic 1 mg weekly        Statin: yes ACE-I/ARB: yes    CONTINUOUS GLUCOSE MONITORING RECORD INTERPRETATION    Dates of Recording: 1/11-1/24/2023  Sensor description:frestyle libre   Results statistics:   CGM use % of time 43  Average and SD 178/33.6  Time in range 51   %  % Time Above 180 38  % Time above 250 11  % Time Below  target 0    Glycemic patterns summary: high BG's during the day post-prandial ,worse post supper , trends down overnight   Hyperglycemic episodes  post prandial   Hypoglycemic episodes occurred fasting   Overnight periods: trends down        DIABETIC COMPLICATIONS: Microvascular complications:   Denies: CKD, retinopathy, neuropathy Last Eye Exam: Completed 2022  Macrovascular complications:   Denies: CAD, CVA, PVD   HISTORY:  Past Medical History:  Past Medical History:  Diagnosis Date   Allergy    ANXIETY    COMMON MIGRAINE    DEPRESSION    DIABETES MELLITUS, TYPE II    GERD (gastroesophageal reflux disease) 10/17/2012   HYPERLIPIDEMIA    HYPERTENSION    IDA (iron deficiency anemia)    POLYP, GALLBLADDER    RLS (restless legs syndrome)    Sickle cell trait ([place])    Past Surgical History:  Past Surgical History:  Procedure Laterality Date   ABDOMINAL HYSTERECTOMY  2000   COLONOSCOPY     LUMBAR [place], 2016, 2017   s/p   Social History:  reports that she has never smoked. She has never used smokeless tobacco. She reports current alcohol use. She reports that she does not use drugs. Family History:  Family History  Problem Relation Age of Onset   Diabetes Mother  Liver cancer Mother    Arthritis Mother    Alcohol abuse Mother    Heart disease Father    Heart attack Father    Stroke Father    Diabetes Brother    Colon cancer Neg Hx    Esophageal cancer Neg Hx    Rectal cancer Neg Hx    Stomach cancer Neg Hx      HOME MEDICATIONS: Allergies as of 03/20/2022       Reactions   Actos [pioglitazone]    dizziness   Demerol [meperidine] Other (See Comments)   Per pt: unknown   Trulicity [dulaglutide] Dermatitis   Prednisone Itching, Rash        Medication List        Accurate as of March 20, 2022  7:26 AM. If you have any questions, ask your nurse or doctor.          azelastine 0.05 % ophthalmic solution Commonly known as:  OPTIVAR Place 1 drop into both eyes 2 (two) times daily.   co-enzyme Q-10 30 MG capsule Take 30 mg by mouth 3 (three) times daily.   diclofenac Sodium 2 % Soln Commonly known as: PENNSAID Place 2 g onto the skin 2 (two) times daily.   diphenhydrAMINE 25 MG tablet Commonly known as: BENADRYL Take 25 mg by mouth daily as needed.   diphenoxylate-atropine 2.5-0.025 MG tablet Commonly known as: Lomotil Take 1 tablet by mouth 4 (four) times daily as needed for diarrhea or loose stools.   DULoxetine 30 MG capsule Commonly known as: CYMBALTA Take 1 capsule by mouth once daily   EXCEDRIN MIGRAINE PO Take by mouth as needed.   famotidine 20 MG tablet Commonly known as: Pepcid Take 1 tablet (20 mg total) by mouth 2 (two) times daily.   FreeStyle Libre 2 Sensor Misc USE ONE AS DIRECTED   FreeStyle Libre Reader Devi Apply 1 Device topically every 14 (fourteen) days. E11.9   hydrocortisone 2.5 % rectal cream Commonly known as: Anusol-HC Place 1 application rectally 2 (two) times daily.   hydrOXYzine 10 MG tablet Commonly known as: ATARAX Take 1 tablet (10 mg total) by mouth 3 (three) times daily as needed.   Lancets Misc Use as directed daily E11.9   lisinopril 20 MG tablet Commonly known as: ZESTRIL Take 1 tablet by mouth once daily   lovastatin 40 MG tablet Commonly known as: MEVACOR Take 1 tablet by mouth once daily   magnesium oxide 400 MG tablet Commonly known as: MAG-OX Take 400 mg by mouth daily.   meloxicam 7.5 MG tablet Commonly known as: MOBIC Take 1 tablet (7.5 mg total) by mouth daily.   metFORMIN 1000 MG tablet Commonly known as: GLUCOPHAGE Take 1 tablet (1,000 mg total) by mouth 2 (two) times daily with a meal.   omega-3 acid ethyl esters 1 g capsule Commonly known as: LOVAZA Take by mouth daily.   omeprazole 40 MG capsule Commonly known as: PRILOSEC TAKE 1 CAPSULE BY MOUTH ONCE DAILY . APPOINTMENT REQUIRED FOR FUTURE REFILLS   ondansetron 4  MG tablet Commonly known as: Zofran Take 1 tablet (4 mg total) by mouth every 8 (eight) hours as needed for nausea or vomiting.   OneTouch Verio test strip Generic drug: glucose blood Use as instructed daily E11.9   OneTouch Verio w/Device Kit Use as directed daily E11.9   ReliOn Pen Needles 32G X 4 MM Misc Generic drug: Insulin Pen Needle USE 1 ONCE DAILY   rOPINIRole 1 MG tablet Commonly  known as: REQUIP TAKE 1 TABLET BY MOUTH ONCE DAILY AT BEDTIME   triamcinolone 55 MCG/ACT Aero nasal inhaler Commonly known as: NASACORT Place 2 sprays into the nose daily.   vitamin B-12 1000 MCG tablet Commonly known as: CYANOCOBALAMIN Take 1 tablet (1,000 mcg total) by mouth daily.         OBJECTIVE:   Vital Signs: There were no vitals taken for this visit.  Wt Readings from Last 3 Encounters:  03/15/22 119 lb 9.6 oz (54.3 kg)  10/26/21 113 lb (51.3 kg)  10/09/21 112 lb (50.8 kg)     Exam: General: Pt appears well and is in NAD  Lungs: Clear with good BS bilat with no rales, rhonchi, or wheezes  Heart: RRR with normal S1 and S2 and no gallops; no murmurs; no rub  Abdomen: Normoactive bowel sounds, soft, nontender, without masses or organomegaly palpable  Extremities: No pretibial edema. No tremor. Normal strength and motion throughout. See detailed diabetic foot exam below.  Neuro: MS is good with appropriate affect, pt is alert and Ox3     DM foot exam: 02/13/2021     The skin of the feet is intact without sores or ulcerations. The pedal pulses are 2+ on right and 2+ on left. The sensation is intact to a screening 5.07, 10 gram monofilament bilaterally       DATA REVIEWED:  Lab Results  Component Value Date   HGBA1C 8.9 (H) 03/15/2022   HGBA1C 6.9 (H) 09/25/2021   HGBA1C 9.6 (A) 06/05/2021     Latest Reference Range & Units 09/25/21 09:05  Sodium 135 - 145 mEq/L 140  Potassium 3.5 - 5.1 mEq/L 3.5  Chloride 96 - 112 mEq/L 104  CO2 19 - 32 mEq/L 28  Glucose  70 - 99 mg/dL 194 (H)  BUN 6 - 23 mg/dL 12  Creatinine 0.40 - 1.20 mg/dL 0.69  Calcium 8.4 - 10.5 mg/dL 9.5  Alkaline Phosphatase 39 - 117 U/L 51  Albumin 3.5 - 5.2 g/dL 4.2  AST 0 - 37 U/L 12  ALT 0 - 35 U/L 9  Total Protein 6.0 - 8.3 g/dL 7.2  Bilirubin, Direct 0.0 - 0.3 mg/dL 0.1  Total Bilirubin 0.2 - 1.2 mg/dL 0.4  GFR >60.00 mL/min 90.96    Latest Reference Range & Units 09/25/21 09:05  TSH 0.35 - 5.50 uIU/mL 1.05    Latest Reference Range & Units 09/25/21 09:05  Creatinine,U mg/dL 65.4  Microalb, Ur 0.0 - 1.9 mg/dL 2.3 (H)  MICROALB/CREAT RATIO 0.0 - 30.0 mg/g 3.6    Results for KHALEELAH, YOWELL (MRN [id number]) as of 06/05/2021 07:31  Ref. Range 02/13/2021 10:45  ISLET CELL ANTIBODY SCREEN Latest Ref Range: NEGATIVE  NEGATIVE   Glutamic Acid Decarb Ab <5 IU/mL <5     ASSESSMENT / PLAN / RECOMMENDATIONS:   1) Type 2 Diabetes Mellitus, Optimally  controlled, Without complications - Most recent A1c of 6.9 %. Goal A1c < 7.0 %.     -I have praised the patient on improved glycemic control with her A1c down from 9.6% to 6.9% - Intolerant to Pioglitazone  -Unfortunately she has local allergic reaction to Trulicity injections, will switch to Ozempic -Will reduce basal insulin due to fasting hypoglycemia  MEDICATIONS: Switch Trulicity to Ozempic 1 mg weekly  Continue Metformin 1000 mg twice  Decrease Lantus 12 units once  daily   EDUCATION / INSTRUCTIONS: BG monitoring instructions: Patient is instructed to check her blood sugars 3 times a day, before  meals . Call [facility] if: BG persistently < 70  I reviewed the Rule of 15 for the treatment of hypoglycemia in detail with the patient. Literature supplied.  2) Diabetic complications:  Eye: Does not have known diabetic retinopathy.  Neuro/ Feet: Does not have known diabetic peripheral neuropathy .  Renal: Patient does not have known baseline CKD. She   is  on an ACEI/ARB at present.     F/U in 4  months     Signed electronically by: Mack Guise, MD  [facility]  [facility] [address] [place], [place] [address] Phone: [phone] FAX: [phone]   CC: Biagio Borg, [place] [address] Phone: [phone]  Fax: [phone]  Return to Endocrinology clinic as below: Future Appointments  Date Time Provider [place]  03/20/2022 10:30 AM Aayush Gelpi, Melanie Crazier, MD [facility] None  03/29/2022  1:00 PM Clydell Hakim, RD [place] [facility]  10/07/2022  1:00 PM Biagio Borg, MD [facility] None

## 2022-03-29 ENCOUNTER — Encounter: Payer: Self-pay | Admitting: Dietician

## 2022-03-29 ENCOUNTER — Encounter: Payer: BC Managed Care – PPO | Attending: Internal Medicine | Admitting: Dietician

## 2022-03-29 DIAGNOSIS — Z713 Dietary counseling and surveillance: Secondary | ICD-10-CM | POA: Diagnosis present

## 2022-03-29 DIAGNOSIS — E119 Type 2 diabetes mellitus without complications: Secondary | ICD-10-CM | POA: Diagnosis not present

## 2022-03-29 NOTE — Progress Notes (Signed)
Diabetes Self-Management Education  Visit Type: Follow-up  Appt. Start Time: 1300 Appt. End Time: 1345  03/29/2022  Ms. Whitman Hero, identified by name and date of birth, is a 66 y.o. female with a diagnosis of Diabetes:  .   ASSESSMENT Patient is here today alone.  She was last seen by this RD on 10/22/2021.  She had a GI virus for 3 weeks.  Tongue has been numb since this virus and taste sensation has decreased.  Covid test was negative. She wears a night guard at night and bites tongue at times. Never started the lantus due to cost and takes 12 units of Semglee.  She has not increased her dose but was instructed to increase the Lantus to 15 units per MD message. She likely may make too much money to meet criteria for patient assistance. She continues to take Metformin. Her next appointment with her endocrinologist in 07/2022 as she missed an appointment due to arriving at the wrong location.  She continues to use the [place] 2.  She is not scanning this frequently enough and catching only about 47% of the data.  Instructed her to scan at least every 8 hours. Time in Range 56%, 27% 181-240, 17% >240, 0% low in the past week with average sensor reading of 176.  This has improved from 38% time in range and average sensor reading of 211 over the past 90 days  History includes Type 2 Diabetes (2008), anemia, depression/anxiety, GERD, HLD, HTN Medications/supplements include:  Metformin, Semglee 12 units q HS (per patient), vitamin B-12, Co Q-10, magnesium, vitmain D, Vitamin B-12, iron, super beets Labs noted to include:  A1C 8.9% 03/15/2022 increased from 6.9% 10/05/2021, 9.6% 06/05/2021, 10.9% 01/09/2021,10.5% 09/28/2020, GFR has decreased from 90 09/2021 to 78 03/15/2022 Vitamin B-12 239 on 09/25/2021 Sleep:  8-9 hours per night plus nap during the day Exercise:  Limited due to shoulders.  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 Patient lives with her husband.  He does not like any vegetables except for green beans.  This has decreased her vegetable intake. She does the shopping and cooking. 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  She helps care for her step daughter's son (49 yo). The step daughter is addicted to drugs.  Also cares for grandchildren at times. She reports increased amounts of stress.  Weight 116 lb (52.6 kg). Body mass index is 22.65 kg/m.   Diabetes Self-Management Education - 03/29/22 1702       Visit Information   Visit Type Follow-up      Psychosocial Assessment   What is the hardest part about your diabetes right now, causing you the most concern, or is the most worrisome to you about your diabetes?   Taking/obtaining medications    Self-care barriers Other (comment)   stress, finances   Self-management support Doctor's office    Other persons present Patient    Patient Concerns Problem Solving    Special Needs None    Preferred Learning Style No preference indicated    Learning Readiness Ready    How often do you need to have someone help you when you read instructions, pamphlets, or other written materials from your doctor or pharmacy? 1 - Never      Pre-Education Assessment   Patient understands the diabetes disease and treatment process. Comprehends key points    Patient understands incorporating nutritional management  into lifestyle. Needs Review    Patient undertands incorporating physical activity into lifestyle. Comprehends key points    Patient understands using medications safely. Needs Review    Patient understands monitoring blood glucose, interpreting and using results Comprehends key points    Patient understands prevention, detection, and treatment of acute complications. Comprehends key points    Patient understands prevention, detection, and treatment of chronic complications. Compreheands key points    Patient understands how  to develop strategies to address psychosocial issues. Comprehends key points    Patient understands how to develop strategies to promote health/change behavior. Needs Review      Complications   Last HgB A1C per patient/outside source 8.9 %   03/15/2022 increased from 6.9% 10/05/2021     Dietary Intake   Breakfast bacon, eggs, 1 slice [employer] toast OR cereal (frosted mini wheats or honey nut cheerios), berries and 2% milk    Lunch sandwich or leftovers    Snack (afternoon) fruit    Dinner meat, vege, starch    Snack (evening) none    Beverage(s) water, diet tea      Patient Education   Previous Diabetes Education Yes (please comment)   10/2021   Healthy Eating Food label reading, portion sizes and measuring food.;Meal options for control of blood glucose level and chronic complications.    Medications Reviewed patients medication for diabetes, action, purpose, timing of dose and side effects.;Other (comment)   problem solving   Monitoring Taught/evaluated CGM (comment)      Individualized Goals (developed by patient)   Nutrition General guidelines for healthy choices and portions discussed    Physical Activity Exercise 5-7 days per week;30 minutes per day    Medications take my medication as prescribed    Problem Solving Medication consistency      Patient Self-Evaluation of Goals - Patient rates self as meeting previously set goals (% of time)   Nutrition 50 - 75 % (half of the time)    Physical Activity >75% (most of the time)    Medications >75% (most of the time)    Monitoring 50 - 75 % (half of the time)    Problem Solving and behavior change strategies  50 - 75 % (half of the time)    Reducing Risk (treating acute and chronic complications) 50 - 75 % (half of the time)    Health Coping >75% (most of the time)      Post-Education Assessment   Patient understands the diabetes disease and treatment process. Demonstrates understanding / competency    Patient understands incorporating  nutritional management into lifestyle. Comprehends key points    Patient undertands incorporating physical activity into lifestyle. Demonstrates understanding / competency    Patient understands using medications safely. Demonstrates understanding / competency    Patient understands monitoring blood glucose, interpreting and using results Demonstrates understanding / competency    Patient understands prevention, detection, and treatment of acute complications. Demonstrates understanding / competency    Patient understands prevention, detection, and treatment of chronic complications. Demonstrates understanding / competency    Patient understands how to develop strategies to address psychosocial issues. Demonstrates understanding / competency    Patient understands how to develop strategies to promote health/change behavior. Demonstrates understanding / competency      Outcomes   Expected Outcomes Demonstrated interest in learning. Expect positive outcomes    Future DMSE 3-4 months    Program Status Not Completed      Subsequent Visit   Since your  last visit have you continued or begun to take your medications as prescribed? Yes    Since your last visit have you experienced any weight changes? No change             Individualized Plan for Diabetes Self-Management Training:   Learning Objective:  Patient will have a greater understanding of diabetes self-management. Patient education plan is to attend individual and/or group sessions per assessed needs and concerns.   Plan:   Patient Instructions  Per your MD message, increase your long acting insulin to 15 units.   Goal fasting blood glucose is 80-130. Scan your Josephine Igo at least every 8 hours and recommend scanning more frequently as well.  When you wake   Before all meals  2 hours after the start of all meals  Before bed  Compare your blood glucose before and after you eat.  The reading should only rise 40-60 points with a  meal.  Be consistent with your Vitamin B-12 Expected Outcomes:  Demonstrated interest in learning. Expect positive outcomes  Education material provided: Meal plan card  If problems or questions, patient to contact team via:  Phone  Future DSME appointment: 3-4 months

## 2022-03-29 NOTE — Patient Instructions (Addendum)
Per your MD message, increase your long acting insulin to 15 units.   Goal fasting blood glucose is 80-130. Scan your Ruth Crawford at least every 8 hours and recommend scanning more frequently as well.  When you wake   Before all meals  2 hours after the start of all meals  Before bed  Compare your blood glucose before and after you eat.  The reading should only rise 40-60 points with a meal.  Be consistent with your Vitamin B-12

## 2022-04-03 ENCOUNTER — Encounter: Payer: Self-pay | Admitting: Internal Medicine

## 2022-04-03 ENCOUNTER — Ambulatory Visit (INDEPENDENT_AMBULATORY_CARE_PROVIDER_SITE_OTHER): Payer: BC Managed Care – PPO | Admitting: Internal Medicine

## 2022-04-03 VITALS — BP 140/82 | HR 84 | Ht 60.0 in | Wt 118.0 lb

## 2022-04-03 DIAGNOSIS — Z794 Long term (current) use of insulin: Secondary | ICD-10-CM

## 2022-04-03 DIAGNOSIS — E119 Type 2 diabetes mellitus without complications: Secondary | ICD-10-CM

## 2022-04-03 DIAGNOSIS — E1165 Type 2 diabetes mellitus with hyperglycemia: Secondary | ICD-10-CM | POA: Diagnosis not present

## 2022-04-03 MED ORDER — GLIPIZIDE 5 MG PO TABS: 5.0000 mg | ORAL_TABLET | Freq: Every day | ORAL | 3 refills | Status: DC

## 2022-04-03 NOTE — Progress Notes (Signed)
Name: Ruth Crawford  Age/ Sex: 66 y.o., female   MRN/ DOB: [id number], 04/05/1956     PCP: Biagio Borg, MD   Reason for Endocrinology Evaluation: Type 2 Diabetes Mellitus  Initial Endocrine Consultative Visit: 02/13/2021    PATIENT IDENTIFIER: Ruth Crawford is a 66 y.o. female with a past medical history of T2Dm, sickle cell trait, RLS, and Dyslipidemia . The patient has followed with Endocrinology clinic since 02/13/2021 for consultative assistance with management of her diabetes.      DIABETIC HISTORY:  [name] was diagnosed with DM in 2008. Her hemoglobin A1c has ranged from  6.4% in 2015, peaking at 10.9% in 2022.    On her initial visit to our clinic her A1c was  10.9% , she was on Invokana, Metformin and Glipizide. We switched Glipizide to basal insulin , Invokana was cost prohibitive. Continued Metformin  Intolerant to Pioglitazone   GAD-65 and Iselt cell antibodies negative    Trulicity started 05/6788 but developed local reaction, started ozempic 09/2021 but had sto stop it 10/2021 due to cost   SUBJECTIVE:   During the last visit (10/09/2021): A1c 6.9% continued Metformin and lantus and started ozempic     Today (04/03/2022): [name] is here for a follow up on diabetes management.  She checks her blood sugars multiple  times daily, through CGM . The patient has had hypoglycemic episodes since the last clinic visit., overnight.    She is recovering from a stomach virus Saw dentist due to dry mouth and tongue numbness    HOME DIABETES REGIMEN:  Metformin 1000 mg twice  Semglee 14 units once  daily        Statin: yes ACE-I/ARB: yes    CONTINUOUS GLUCOSE MONITORING RECORD INTERPRETATION    Dates of Recording: 7/6-7/19/2023  Sensor description:frestyle libre   Results statistics:   CGM use % of time 50  Average and SD 178/32  Time in range 56  %  % Time Above 180 31  % Time above 250 13  % Time Below target 0    Glycemic patterns summary:  BG's optimal overnight but high during the day   Hyperglycemic episodes  post prandial   Hypoglycemic episodes occurred n/a  Overnight periods: trends down        DIABETIC COMPLICATIONS: Microvascular complications:   Denies: CKD, retinopathy, neuropathy Last Eye Exam: Completed 2022  Macrovascular complications:   Denies: CAD, CVA, PVD   HISTORY:  Past Medical History:  Past Medical History:  Diagnosis Date   Allergy    ANXIETY    COMMON MIGRAINE    DEPRESSION    DIABETES MELLITUS, TYPE II    GERD (gastroesophageal reflux disease) 10/17/2012   HYPERLIPIDEMIA    HYPERTENSION    IDA (iron deficiency anemia)    POLYP, GALLBLADDER    RLS (restless legs syndrome)    Sickle cell trait ([place])    Past Surgical History:  Past Surgical History:  Procedure Laterality Date   ABDOMINAL HYSTERECTOMY  2000   COLONOSCOPY     LUMBAR [place], 2016, 2017   s/p   Social History:  reports that she has never smoked. She has never used smokeless tobacco. She reports current alcohol use. She reports that she does not use drugs. Family History:  Family History  Problem Relation Age of Onset   Diabetes Mother    Liver cancer Mother    Arthritis Mother    Alcohol abuse Mother  Heart disease Father    Heart attack Father    Stroke Father    Diabetes Brother    Colon cancer Neg Hx    Esophageal cancer Neg Hx    Rectal cancer Neg Hx    Stomach cancer Neg Hx      HOME MEDICATIONS: Allergies as of 04/03/2022       Reactions   Actos [pioglitazone]    dizziness   Demerol [meperidine] Other (See Comments)   Per pt: unknown   Trulicity [dulaglutide] Dermatitis   Prednisone Itching, Rash        Medication List        Accurate as of April 03, 2022  3:13 PM. If you have any questions, ask your nurse or doctor.          azelastine 0.05 % ophthalmic solution Commonly known as: OPTIVAR Place 1 drop into both eyes 2 (two) times daily.   co-enzyme Q-10 30  MG capsule Take 30 mg by mouth 3 (three) times daily.   diclofenac Sodium 2 % Soln Commonly known as: PENNSAID Place 2 g onto the skin 2 (two) times daily.   diphenhydrAMINE 25 MG tablet Commonly known as: BENADRYL Take 25 mg by mouth daily as needed.   diphenoxylate-atropine 2.5-0.025 MG tablet Commonly known as: Lomotil Take 1 tablet by mouth 4 (four) times daily as needed for diarrhea or loose stools.   DULoxetine 30 MG capsule Commonly known as: CYMBALTA Take 1 capsule by mouth once daily   EXCEDRIN MIGRAINE PO Take by mouth as needed.   famotidine 20 MG tablet Commonly known as: Pepcid Take 1 tablet (20 mg total) by mouth 2 (two) times daily.   FreeStyle Libre 2 Sensor Misc USE ONE AS DIRECTED   FreeStyle Libre Reader Devi Apply 1 Device topically every 14 (fourteen) days. E11.9   hydrocortisone 2.5 % rectal cream Commonly known as: Anusol-HC Place 1 application rectally 2 (two) times daily.   hydrOXYzine 10 MG tablet Commonly known as: ATARAX Take 1 tablet (10 mg total) by mouth 3 (three) times daily as needed.   Lancets Misc Use as directed daily E11.9   lisinopril 20 MG tablet Commonly known as: ZESTRIL Take 1 tablet by mouth once daily   lovastatin 40 MG tablet Commonly known as: MEVACOR Take 1 tablet by mouth once daily   magnesium oxide 400 MG tablet Commonly known as: MAG-OX Take 400 mg by mouth daily.   meloxicam 7.5 MG tablet Commonly known as: MOBIC Take 1 tablet (7.5 mg total) by mouth daily.   metFORMIN 1000 MG tablet Commonly known as: GLUCOPHAGE Take 1 tablet (1,000 mg total) by mouth 2 (two) times daily with a meal.   omega-3 acid ethyl esters 1 g capsule Commonly known as: LOVAZA Take by mouth daily.   omeprazole 40 MG capsule Commonly known as: PRILOSEC TAKE 1 CAPSULE BY MOUTH ONCE DAILY . APPOINTMENT REQUIRED FOR FUTURE REFILLS   ondansetron 4 MG tablet Commonly known as: Zofran Take 1 tablet (4 mg total) by mouth every 8  (eight) hours as needed for nausea or vomiting.   OneTouch Verio test strip Generic drug: glucose blood Use as instructed daily E11.9   OneTouch Verio w/Device Kit Use as directed daily E11.9   ReliOn Pen Needles 32G X 4 MM Misc Generic drug: Insulin Pen Needle USE 1 ONCE DAILY   rOPINIRole 1 MG tablet Commonly known as: REQUIP TAKE 1 TABLET BY MOUTH ONCE DAILY AT BEDTIME   Semglee (yfgn) 100  UNIT/ML injection Generic drug: insulin glargine-yfgn Inject 14 Units into the skin at bedtime.   triamcinolone 55 MCG/ACT Aero nasal inhaler Commonly known as: NASACORT Place 2 sprays into the nose daily.   vitamin B-12 1000 MCG tablet Commonly known as: CYANOCOBALAMIN Take 1 tablet (1,000 mcg total) by mouth daily.         OBJECTIVE:   Vital Signs: BP 140/82 (BP Location: Left Arm, Patient Position: Sitting, Cuff Size: Small)   Pulse 84   Ht 5' (1.524 m)   Wt 118 lb (53.5 kg)   SpO2 98%   BMI 23.05 kg/m   Wt Readings from Last 3 Encounters:  04/03/22 118 lb (53.5 kg)  03/29/22 116 lb (52.6 kg)  03/15/22 119 lb 9.6 oz (54.3 kg)     Exam: General: Pt appears well and is in NAD  Lungs: Clear with good BS bilat with no rales, rhonchi, or wheezes  Heart: RRR   Abdomen: Normoactive bowel sounds, soft, nontender, without masses or organomegaly palpable  Extremities: No pretibial edema. No tremor. Normal strength and motion throughout. See detailed diabetic foot exam below.  Neuro: MS is good with appropriate affect, pt is alert and Ox3     DM foot exam: 02/13/2021     The skin of the feet is intact without sores or ulcerations. The pedal pulses are 2+ on right and 2+ on left. The sensation is intact to a screening 5.07, 10 gram monofilament bilaterally       DATA REVIEWED:  Lab Results  Component Value Date   HGBA1C 8.9 (H) 03/15/2022   HGBA1C 6.9 (H) 09/25/2021   HGBA1C 9.6 (A) 06/05/2021     Latest Reference Range & Units 09/25/21 09:05  Sodium 135 - 145  mEq/L 140  Potassium 3.5 - 5.1 mEq/L 3.5  Chloride 96 - 112 mEq/L 104  CO2 19 - 32 mEq/L 28  Glucose 70 - 99 mg/dL 194 (H)  BUN 6 - 23 mg/dL 12  Creatinine 0.40 - 1.20 mg/dL 0.69  Calcium 8.4 - 10.5 mg/dL 9.5  Alkaline Phosphatase 39 - 117 U/L 51  Albumin 3.5 - 5.2 g/dL 4.2  AST 0 - 37 U/L 12  ALT 0 - 35 U/L 9  Total Protein 6.0 - 8.3 g/dL 7.2  Bilirubin, Direct 0.0 - 0.3 mg/dL 0.1  Total Bilirubin 0.2 - 1.2 mg/dL 0.4  GFR >60.00 mL/min 90.96    Latest Reference Range & Units 09/25/21 09:05  TSH 0.35 - 5.50 uIU/mL 1.05    Latest Reference Range & Units 09/25/21 09:05  Creatinine,U mg/dL 65.4  Microalb, Ur 0.0 - 1.9 mg/dL 2.3 (H)  MICROALB/CREAT RATIO 0.0 - 30.0 mg/g 3.6    Results for NIARA, BUNKER (MRN [id number]) as of 06/05/2021 07:31  Ref. Range 02/13/2021 10:45  ISLET CELL ANTIBODY SCREEN Latest Ref Range: NEGATIVE  NEGATIVE   Glutamic Acid Decarb Ab <5 IU/mL <5     ASSESSMENT / PLAN / RECOMMENDATIONS:   1) Type 2 Diabetes Mellitus, Poorly controlled, Without complications - Most recent A1c of 8.9 %. Goal A1c < 7.0 %.    - A1c increased from 6.9% to 8.9% - Intolerant to Pioglitazone  -Unfortunately she has local allergic reaction to Trulicity injections -Ozempic and SGLT2 inhibitors have been cost prohibitive -We discussed starting prandial dose of insulin but she is not keen on multiple daily injections, I did offer glipizide in addition to [facility] as a last resort, she does understand that there is a high risk of  hypoglycemia -I am going to reduce her basal insulin due to hypoglycemia noted overnight that she endorses  MEDICATIONS:  Start glipizide 5 mg, 1 tablet before breakfast Decrease Semglee 12 units daily Continue Metformin 1000 mg twice    EDUCATION / INSTRUCTIONS: BG monitoring instructions: Patient is instructed to check her blood sugars 3 times a day, before meals . Call [facility] if: BG persistently < 70  I reviewed the Rule of  15 for the treatment of hypoglycemia in detail with the patient. Literature supplied.  2) Diabetic complications:  Eye: Does not have known diabetic retinopathy.  Neuro/ Feet: Does not have known diabetic peripheral neuropathy .  Renal: Patient does not have known baseline CKD. She   is  on an ACEI/ARB at present.     F/U in 4 months     Signed electronically by: Mack Guise, MD  [facility]  [facility] [address] [place], [place] [address] Phone: [phone] FAX: [phone]   CC: Biagio Borg, [place] [address] Phone: [phone]  Fax: [phone]  Return to Endocrinology clinic as below: Future Appointments  Date Time Provider [place]  06/21/2022  1:00 PM Clydell Hakim, RD [place] [facility]  10/07/2022  1:00 PM Biagio Borg, MD [id number] None

## 2022-04-03 NOTE — Patient Instructions (Signed)
-   Continue Metformin 1000 mg twice  - Decrease Semglee  12 units once  daily  - Start Glipizide 5 mg , 1 tablet before Breakfast     HOW TO TREAT LOW BLOOD SUGARS (Blood sugar LESS THAN 70 MG/DL) Please follow the RULE OF 15 for the treatment of hypoglycemia treatment (when your (blood sugars are less than 70 mg/dL)   STEP 1: Take 15 grams of carbohydrates when your blood sugar is low, which includes:  3-4 GLUCOSE TABS  OR 3-4 OZ OF JUICE OR REGULAR SODA OR ONE TUBE OF GLUCOSE GEL    STEP 2: RECHECK blood sugar in 15 MINUTES STEP 3: If your blood sugar is still low at the 15 minute recheck --> then, go back to STEP 1 and treat AGAIN with another 15 grams of carbohydrates.

## 2022-04-04 ENCOUNTER — Encounter: Payer: Self-pay | Admitting: Internal Medicine

## 2022-04-04 DIAGNOSIS — E1165 Type 2 diabetes mellitus with hyperglycemia: Secondary | ICD-10-CM | POA: Insufficient documentation

## 2022-04-04 HISTORY — DX: Type 2 diabetes mellitus with hyperglycemia: E11.65

## 2022-04-08 ENCOUNTER — Other Ambulatory Visit: Payer: Self-pay | Admitting: Internal Medicine

## 2022-04-18 ENCOUNTER — Encounter: Payer: Self-pay | Admitting: Internal Medicine

## 2022-04-18 DIAGNOSIS — K146 Glossodynia: Secondary | ICD-10-CM

## 2022-04-30 ENCOUNTER — Encounter: Payer: Self-pay | Admitting: Internal Medicine

## 2022-05-06 DIAGNOSIS — R432 Parageusia: Secondary | ICD-10-CM | POA: Insufficient documentation

## 2022-05-06 HISTORY — DX: Parageusia: R43.2

## 2022-05-07 ENCOUNTER — Encounter: Payer: Self-pay | Admitting: Neurology

## 2022-05-08 ENCOUNTER — Ambulatory Visit: Payer: BC Managed Care – PPO | Admitting: Internal Medicine

## 2022-05-10 ENCOUNTER — Encounter: Payer: Self-pay | Admitting: Internal Medicine

## 2022-05-31 ENCOUNTER — Ambulatory Visit (INDEPENDENT_AMBULATORY_CARE_PROVIDER_SITE_OTHER): Payer: BC Managed Care – PPO | Admitting: Internal Medicine

## 2022-05-31 VITALS — BP 162/90 | HR 96 | Temp 98.4°F | Ht 60.0 in | Wt 122.0 lb

## 2022-05-31 DIAGNOSIS — E1165 Type 2 diabetes mellitus with hyperglycemia: Secondary | ICD-10-CM | POA: Diagnosis not present

## 2022-05-31 DIAGNOSIS — I10 Essential (primary) hypertension: Secondary | ICD-10-CM | POA: Diagnosis not present

## 2022-05-31 DIAGNOSIS — M545 Low back pain, unspecified: Secondary | ICD-10-CM | POA: Insufficient documentation

## 2022-05-31 HISTORY — DX: Low back pain, unspecified: M54.50

## 2022-05-31 MED ORDER — METFORMIN HCL 1000 MG PO TABS: 1000.0000 mg | ORAL_TABLET | Freq: Two times a day (BID) | ORAL | 1 refills | Status: DC

## 2022-05-31 MED ORDER — KETOROLAC TROMETHAMINE 30 MG/ML IJ SOLN
30.0000 mg | Freq: Once | INTRAMUSCULAR | Status: AC
  Administered 2022-05-31: 30 mg via INTRAMUSCULAR

## 2022-05-31 MED ORDER — CYCLOBENZAPRINE HCL 5 MG PO TABS: 5.0000 mg | ORAL_TABLET | Freq: Three times a day (TID) | ORAL | 1 refills | Status: DC | PRN

## 2022-05-31 NOTE — Progress Notes (Unsigned)
Patient ID: Ruth Crawford, female   DOB: 12/04/1955, 66 y.o.   MRN: [id number]        Chief Complaint: follow up right lower back pain, DM, htn       HPI:  Ruth Crawford is a 66 y.o. female here with c/o acute onset right lower back pain, dull and sharp, moderate, intermittent, worse with standing up and bending, without radiation to the buttocks or RLE; concerned about UTI but Denies urinary symptoms such as dysuria, frequency, urgency, flank pain, hematuria or n/v, fever, chills.  Pt denies chest pain, increased sob or doe, wheezing, orthopnea, PND, increased LE swelling, palpitations, dizziness or syncope.   Pt denies polydipsia, polyuria, or new focal neuro s/s.   Due for metformin refill per pt       Wt Readings from Last 3 Encounters:  05/31/22 122 lb (55.3 kg)  04/03/22 118 lb (53.5 kg)  03/29/22 116 lb (52.6 kg)   BP Readings from Last 3 Encounters:  05/31/22 (!) 162/90  04/03/22 140/82  03/15/22 (!) 150/82         Past Medical History:  Diagnosis Date   Allergy    ANXIETY    COMMON MIGRAINE    DEPRESSION    DIABETES MELLITUS, TYPE II    GERD (gastroesophageal reflux disease) 10/17/2012   HYPERLIPIDEMIA    HYPERTENSION    IDA (iron deficiency anemia)    POLYP, GALLBLADDER    RLS (restless legs syndrome)    Sickle cell trait ([place])    Past Surgical History:  Procedure Laterality Date   ABDOMINAL HYSTERECTOMY  2000   [place], 2016, 2017   s/p    reports that she has never smoked. She has never used smokeless tobacco. She reports current alcohol use. She reports that she does not use drugs. family history includes Alcohol abuse in her mother; Arthritis in her mother; Diabetes in her brother and mother; Heart attack in her father; Heart disease in her father; Liver cancer in her mother; Stroke in her father. Allergies  Allergen Reactions   Actos [Pioglitazone]     dizziness   Demerol [Meperidine] Other (See Comments)    Per pt: unknown    Trulicity [Dulaglutide] Dermatitis   Prednisone Itching and Rash   Current Outpatient Medications on File Prior to Visit  Medication Sig Dispense Refill   Continuous Blood Gluc Sensor (FREESTYLE LIBRE 2 SENSOR) MISC See admin instructions.     Aspirin-Acetaminophen-Caffeine (EXCEDRIN MIGRAINE PO) Take by mouth as needed.     azelastine (OPTIVAR) 0.05 % ophthalmic solution Place 1 drop into both eyes 2 (two) times daily. 6 mL 12   Blood Glucose Monitoring Suppl (ONETOUCH VERIO) w/Device KIT Use as directed daily E11.9 1 kit 0   co-enzyme Q-10 30 MG capsule Take 30 mg by mouth 3 (three) times daily.     Continuous Blood Gluc Receiver (FREESTYLE LIBRE READER) DEVI Apply 1 Device topically every 14 (fourteen) days. E11.9 6 each 3   Continuous Blood Gluc Sensor (FREESTYLE LIBRE 2 SENSOR) MISC USE ONE AS DIRECTED 6 each 2   Diclofenac Sodium 2 % SOLN Place 2 g onto the skin 2 (two) times daily. 112 g 3   diphenhydrAMINE (BENADRYL) 25 MG tablet Take 25 mg by mouth daily as needed.     diphenoxylate-atropine (LOMOTIL) 2.5-0.025 MG tablet Take 1 tablet by mouth 4 (four) times daily as needed for diarrhea or loose stools. 30 tablet 0  DULoxetine (CYMBALTA) 30 MG capsule Take 1 capsule by mouth once daily 90 capsule 2   famotidine (PEPCID) 20 MG tablet Take 1 tablet (20 mg total) by mouth 2 (two) times daily. 180 tablet 3   glipiZIDE (GLUCOTROL) 5 MG tablet Take 1 tablet (5 mg total) by mouth daily before breakfast. 90 tablet 3   glucose blood (ONETOUCH VERIO) test strip Use as instructed daily E11.9 100 each 12   hydrocortisone (ANUSOL-HC) 2.5 % rectal cream Place 1 application rectally 2 (two) times daily. 30 g 0   hydrOXYzine (ATARAX/VISTARIL) 10 MG tablet Take 1 tablet (10 mg total) by mouth 3 (three) times daily as needed. 30 tablet 0   insulin glargine-yfgn (SEMGLEE, YFGN,) 100 UNIT/ML injection Inject 14 Units into the skin at bedtime.     Lancets MISC Use as directed daily E11.9 100 each 0    lisinopril (ZESTRIL) 20 MG tablet Take 1 tablet by mouth once daily 90 tablet 2   lovastatin (MEVACOR) 40 MG tablet Take 1 tablet by mouth once daily 90 tablet 2   magnesium oxide (MAG-OX) 400 MG tablet Take 400 mg by mouth daily.     meloxicam (MOBIC) 7.5 MG tablet Take 1 tablet (7.5 mg total) by mouth daily. 90 tablet 1   omega-3 acid ethyl esters (LOVAZA) 1 g capsule Take by mouth daily.     omeprazole (PRILOSEC) 40 MG capsule TAKE 1 CAPSULE BY MOUTH ONCE DAILY . APPOINTMENT REQUIRED FOR FUTURE REFILLS 90 capsule 3   ondansetron (ZOFRAN) 4 MG tablet Take 1 tablet (4 mg total) by mouth every 8 (eight) hours as needed for nausea or vomiting. 30 tablet 0   [place] PEN NEEDLES 32G X 4 MM MISC USE 1 ONCE DAILY 50 each 1   rOPINIRole (REQUIP) 1 MG tablet TAKE 1 TABLET BY MOUTH ONCE DAILY AT BEDTIME 90 tablet 2   SEMGLEE, YFGN, 100 UNIT/ML Pen Inject into the skin.     triamcinolone (NASACORT) 55 MCG/ACT AERO nasal inhaler Place 2 sprays into the nose daily. 1 each 12   vitamin B-12 (CYANOCOBALAMIN) 1000 MCG tablet Take 1 tablet (1,000 mcg total) by mouth daily. 90 tablet 3   No current facility-administered medications on file prior to visit.        ROS:  All others reviewed and negative.  Objective        PE:  BP (!) 162/90 (BP Location: Right Arm, Patient Position: Sitting, Cuff Size: Large)   Pulse 96   Temp 98.4 F (36.9 C) (Oral)   Ht 5' (1.524 m)   Wt 122 lb (55.3 kg)   SpO2 97%   BMI 23.83 kg/m                 Constitutional: Pt appears in NAD               HENT: Head: NCAT.                Right Ear: External ear normal.                 Left Ear: External ear normal.                Eyes: . Pupils are equal, round, and reactive to light. Conjunctivae and EOM are normal               Nose: without d/c or deformity               Neck: Neck  supple. Gross normal ROM               Cardiovascular: Normal rate and regular rhythm.                 Pulmonary/Chest: Effort normal and  breath sounds without rales or wheezing.                Abd:  Soft, NT, ND, + BS, no organomegaly; lumbar spine in the midline nontender, but has right lumbar paravertebral tender spasm               Neurological: Pt is alert. At baseline orientation, motor grossly intact               Skin: Skin is warm. No rashes, no other new lesions, LE edema - none               Psychiatric: Pt behavior is normal without agitation   Micro: none  Cardiac tracings I have personally interpreted today:  none  Pertinent Radiological findings (summarize): none   Lab Results  Component Value Date   WBC 9.0 03/15/2022   HGB 10.7 (L) 03/15/2022   HCT 33.3 (L) 03/15/2022   PLT 399.0 03/15/2022   GLUCOSE 191 (H) 03/15/2022   CHOL 129 09/25/2021   TRIG 56.0 09/25/2021   HDL 69.60 09/25/2021   LDLCALC 48 09/25/2021   ALT 8 03/15/2022   AST 11 03/15/2022   NA 139 03/15/2022   K 4.1 03/15/2022   CL 103 03/15/2022   CREATININE 0.79 03/15/2022   BUN 16 03/15/2022   CO2 30 03/15/2022   TSH 1.05 09/25/2021   INR 1.0 08/26/2018   HGBA1C 8.9 (H) 03/15/2022   MICROALBUR 2.3 (H) 09/25/2021   Assessment/Plan:  Ruth Crawford is a 66 y.o. Black or African American [2] female with  has a past medical history of Allergy, ANXIETY, COMMON MIGRAINE, DEPRESSION, DIABETES MELLITUS, TYPE II, GERD (gastroesophageal reflux disease) (10/17/2012), HYPERLIPIDEMIA, HYPERTENSION, IDA (iron deficiency anemia), POLYP, GALLBLADDER, RLS (restless legs syndrome), and Sickle cell trait ([place]).  Diabetes Lab Results  Component Value Date   HGBA1C 8.9 (H) 03/15/2022   Uncontrolled recently but now better compliacne with diet per pt, pt to continue current medical treatment glipzide ER 5 qd, semgless 14 u qhs,  And restart metformin ER 500 mg qd   Essential hypertension BP Readings from Last 3 Encounters:  05/31/22 (!) 162/90  04/03/22 140/82  03/15/22 (!) 150/82   Uncontrolled, pt states controlled at home, possible reactive  today with pain,, pt to continue medical treatment lisinopril 20 mg qd as declines change   Low back pain Exam c/w msk spasm, ok for check UA but suspect msk strain - for flexeril 5 mg tid prn, also toradol 30 mg IM x 1  Followup: No follow-ups on file.  Cathlean Cower, MD 06/02/2022 7:42 PM [facility]

## 2022-05-31 NOTE — Patient Instructions (Signed)
Please take all new medication as prescribed  - the muscle relaxer  You are given the pain shot today (toradol 30 mg)  Please continue all other medications as before, and refills have been done if requested.  Please have the pharmacy call with any other refills you may need.  Please continue your efforts at being more active, low cholesterol diet, and weight control.  You are otherwise up to date with prevention measures today.  Please keep your appointments with your specialists as you may have planned  Please go to the LAB at the blood drawing area for the tests to be done - just the urine testing today  You will be contacted by phone if any changes need to be made immediately.  Otherwise, you will receive a letter about your results with an explanation, but please check with MyChart first.  Please remember to sign up for MyChart if you have not done so, as this will be important to you in the future with finding out test results, communicating by private email, and scheduling acute appointments online when needed.

## 2022-06-01 LAB — URINALYSIS, ROUTINE W REFLEX MICROSCOPIC
Bilirubin Urine: NEGATIVE
Hgb urine dipstick: NEGATIVE
Ketones, ur: NEGATIVE
Leukocytes,Ua: NEGATIVE
Nitrite: NEGATIVE
Protein, ur: NEGATIVE
Specific Gravity, Urine: 1.017 (ref 1.001–1.035)
pH: 5.5 (ref 5.0–8.0)

## 2022-06-02 ENCOUNTER — Encounter: Payer: Self-pay | Admitting: Internal Medicine

## 2022-06-02 NOTE — Assessment & Plan Note (Addendum)
Exam c/w msk spasm, ok for check UA but suspect msk strain - for flexeril 5 mg tid prn, also toradol 30 mg IM x 1

## 2022-06-02 NOTE — Assessment & Plan Note (Signed)
Lab Results  Component Value Date   HGBA1C 8.9 (H) 03/15/2022   Uncontrolled recently but now better compliacne with diet per pt, pt to continue current medical treatment glipzide ER 5 qd, semgless 14 u qhs,  And restart metformin ER 500 mg qd

## 2022-06-02 NOTE — Assessment & Plan Note (Signed)
BP Readings from Last 3 Encounters:  05/31/22 (!) 162/90  04/03/22 140/82  03/15/22 (!) 150/82   Uncontrolled, pt states controlled at home, possible reactive today with pain,, pt to continue medical treatment lisinopril 20 mg qd as declines change

## 2022-06-21 ENCOUNTER — Encounter: Payer: Self-pay | Admitting: Dietician

## 2022-06-21 ENCOUNTER — Encounter: Payer: BC Managed Care – PPO | Attending: Internal Medicine | Admitting: Dietician

## 2022-06-21 DIAGNOSIS — E1165 Type 2 diabetes mellitus with hyperglycemia: Secondary | ICD-10-CM | POA: Insufficient documentation

## 2022-06-21 NOTE — Progress Notes (Signed)
Diabetes Self-Management Education  Visit Type: Follow-up  Appt. Start Time: 1315 Appt. End Time: 8657  06/21/2022  [name], identified by name and date of birth, is a 66 y.o. female with a diagnosis of Diabetes:  .   ASSESSMENT  Patient is here today alone.  She was last seen in this office by this RD on 03/29/2022. She complains of a poor appetite and is only craving sweets.  Eating small portions.  Trying to make better portions. She states that she has a cold today.  We are both wearing masks. She continues to use the [employer] but is not scanning this enough.  She also did not wear one when she went on a cruise recently. Libre 2 sensor usage 39% Time in range 69%, high 23%, 7% very high and 1% low within the past 7 days. Low blood glucose about 69 when she wakes.  Treats with small amounts of juice. Increased stress, helping with mother-in-law who had a heart attack.  History includes Type 2 Diabetes (2008), anemia, depression/anxiety, GERD, HLD, HTN Medications/supplements include:  glipizide, metformin, Semglee 12 units q HS (per patient), Co Q-10, magnesium, vitmain D, Vitamin B-12, iron, super beets Labs noted to include:  A1C 8.9% 03/15/2022 increased from 6.9% 10/05/2021, 9.6% 06/05/2021, 10.9% 01/09/2021,10.5% 09/28/2020, GFR has decreased from 90 09/2021 to 78 03/15/2022 Vitamin B-12 239 on 09/25/2021 Sleep:  8-9 hours per night plus nap during the day Exercise:  Limited due to shoulders.  Likes stationary bike, caring for 79 yo grandson.  Currently states that she is not motivated for exercise. Stress:  high FreeStyle Libre (uses app on her phone).  Weight hx: 60" 123 lbs  06/21/2022 116 lbs 03/29/2022  Patient lives with her husband.  He does not like any vegetables except for green beans.  This has decreased her vegetable intake. She does the shopping and cooking. 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  She helps care for her step  daughter's son (80 yo). The step daughter is addicted to drugs.  Also cares for grandchildren at times. She reports increased amounts of stress.  Weight 123 lb (55.8 kg). Body mass index is 24.02 kg/m.   Diabetes Self-Management Education - 06/21/22 1600       Visit Information   Visit Type Follow-up      Psychosocial Assessment   Patient Belief/Attitude about Diabetes Motivated to manage diabetes    Self-care barriers Other (comment)   stress and finances   Self-management support Doctor's office;CDE visits    Other persons present Patient      Pre-Education Assessment   Patient understands the diabetes disease and treatment process. Comprehends key points    Patient understands incorporating nutritional management into lifestyle. Needs Review    Patient undertands incorporating physical activity into lifestyle. Comprehends key points    Patient understands using medications safely. Comprehends key points    Patient understands monitoring blood glucose, interpreting and using results Comprehends key points    Patient understands prevention, detection, and treatment of acute complications. Comprehends key points    Patient understands prevention, detection, and treatment of chronic complications. Compreheands key points    Patient understands how to develop strategies to address psychosocial issues. Comprehends key points    Patient understands how to develop strategies to promote health/change behavior. Needs Review      Complications   How often do you check your blood sugar? > 4 times/day    Fasting Blood  glucose range (mg/dL) 130-179    Postprandial Blood glucose range (mg/dL) 130-179    Number of hypoglycemic episodes per month 2    Can you tell when your blood sugar is low? Yes    What do you do if your blood sugar is low? drink juice      Dietary Intake   Breakfast Cinnamon toast crunch, 2% milk    Lunch 1/2 PB sandwich and 2 bites canteloupe    Dinner mac and cheese,  sausages    Beverage(s) water, unsweetened decaf tea, occasional alcohol      Patient Education   Previous Diabetes Education Yes (please comment)   03/2022   Healthy Eating Role of diet in the treatment of diabetes and the relationship between the three main macronutrients and blood glucose level;Food label reading, portion sizes and measuring food.;Meal options for control of blood glucose level and chronic complications.;Plate Method    Being Active Role of exercise on diabetes management, blood pressure control and cardiac health.;Helped patient identify appropriate exercises in relation to his/her diabetes, diabetes complications and other health issue.    Medications Reviewed patients medication for diabetes, action, purpose, timing of dose and side effects.    Monitoring Taught/evaluated CGM (comment)      Individualized Goals (developed by patient)   Nutrition General guidelines for healthy choices and portions discussed    Physical Activity Exercise 5-7 days per week;30 minutes per day    Medications take my medication as prescribed    Monitoring  Consistenly use CGM    Problem Solving Eating Pattern;Addressing barriers to behavior change      Patient Self-Evaluation of Goals - Patient rates self as meeting previously set goals (% of time)   Nutrition 50 - 75 % (half of the time)    Physical Activity < 25% (hardly ever/never)    Medications >75% (most of the time)    Monitoring 50 - 75 % (half of the time)    Problem Solving and behavior change strategies  50 - 75 % (half of the time)    Reducing Risk (treating acute and chronic complications) 50 - 75 % (half of the time)    Health Coping >75% (most of the time)      Post-Education Assessment   Patient understands the diabetes disease and treatment process. Demonstrates understanding / competency    Patient understands incorporating nutritional management into lifestyle. Comprehends key points    Patient undertands incorporating  physical activity into lifestyle. Comprehends key points    Patient understands using medications safely. Comphrehends key points    Patient understands monitoring blood glucose, interpreting and using results Comprehends key points    Patient understands prevention, detection, and treatment of acute complications. Comprehends key points    Patient understands prevention, detection, and treatment of chronic complications. Comprehends key points    Patient understands how to develop strategies to address psychosocial issues. Comprehends key points    Patient understands how to develop strategies to promote health/change behavior. Comprehends key points      Outcomes   Expected Outcomes Demonstrated interest in learning. Expect positive outcomes    Future DMSE 3-4 months    Program Status Not Completed      Subsequent Visit   Since your last visit have you experienced any weight changes? Gain    Weight Gain (lbs) 7             Individualized Plan for Diabetes Self-Management Training:   Learning Objective:  Patient  will have a greater understanding of diabetes self-management. Patient education plan is to attend individual and/or group sessions per assessed needs and concerns.   Plan:   Patient Instructions  Continue your medication as prescribed.  Goal fasting blood glucose is 80-130. Scan your Elenor Legato at least every 8 hours and recommend scanning more frequently as well.             When you wake                       Before all meals             2 hours after the start of all meals             Before bed   Compare your blood glucose before and after you eat.  The reading should only rise 40-60 points with a meal.   Be consistent with your Vitamin B-12  Increase your vegetable intake.  Choose a small fruit with meals.  Choose unsweetened cereal rather than sweet and have cereal less often.  Goal for exercise:  Stationary bike or walk most days  Start with 10 minutes  after a meal and do this 2 times daily or increase the time.  Expected Outcomes:  Demonstrated interest in learning. Expect positive outcomes  Education material provided:   If problems or questions, patient to contact team via:  Phone  Future DSME appointment: 3-4 months

## 2022-06-21 NOTE — Patient Instructions (Addendum)
Continue your medication as prescribed.  Goal fasting blood glucose is 80-130. Scan your Elenor Legato at least every 8 hours and recommend scanning more frequently as well.             When you wake                       Before all meals             2 hours after the start of all meals             Before bed   Compare your blood glucose before and after you eat.  The reading should only rise 40-60 points with a meal.   Be consistent with your Vitamin B-12  Increase your vegetable intake.  Choose a small fruit with meals.  Choose unsweetened cereal rather than sweet and have cereal less often.  Goal for exercise:  Stationary bike or walk most days  Start with 10 minutes after a meal and do this 2 times daily or increase the time.

## 2022-06-29 ENCOUNTER — Other Ambulatory Visit: Payer: Self-pay | Admitting: Internal Medicine

## 2022-07-15 ENCOUNTER — Telehealth: Payer: Self-pay

## 2022-07-15 NOTE — Telephone Encounter (Signed)
Transition Care Management Follow-up Telephone Call Date of discharge and from where: 07/13/22 @ [facility] ([place]) How have you been since you were released from the hospital? No Any questions or concerns? Yes   Items Reviewed: Did the pt receive and understand the discharge instructions provided? Yes  Medications obtained and verified? Yes  Other? No  Any new allergies since your discharge? No  Dietary orders reviewed? Yes Do you have support at home? Yes   Home Care and Equipment/Supplies: Were home health services ordered? not applicable If so, what is the name of the agency? N/A  Has the agency set up a time to come to the patient's home? not applicable Were any new equipment or medical supplies ordered?  No What is the name of the medical supply agency? N/A Were you able to get the supplies/equipment? not applicable Do you have any questions related to the use of the equipment or supplies? No  Functional Questionnaire: (I = Independent and D = Dependent) ADLs: I  Bathing/Dressing- I  Meal Prep- I  Eating- I  Maintaining continence- I  Transferring/Ambulation- D  Managing Meds- I  Follow up appointments reviewed:  PCP Hospital f/u appt confirmed? Yes  Scheduled to see Cathlean Cower  on 08/01/22 @ 2:40pm. [facility] f/u appt confirmed? No  Scheduled to see N/A on N/A @ N/A. Are transportation arrangements needed? No  If their condition worsens, is the pt aware to call PCP or go to the Emergency Dept.? Yes Was the patient provided with contact information for the PCP's office or ED? Yes Was to pt encouraged to call back with questions or concerns? Yes

## 2022-07-15 NOTE — Telephone Encounter (Signed)
Spoke to pt after ED visit, pt is concerned and wondering if she can be prescribed something else apart from what they gave her at the ED OxyCODONE-acetaminophen (PERCOCET) 5-325 mg per tablet Take 1 tablet by mouth every 4 as needed since it making her sick to her stomach

## 2022-07-16 MED ORDER — HYDROCODONE-ACETAMINOPHEN 7.5-325 MG PO TABS: 1.0000 | ORAL_TABLET | Freq: Four times a day (QID) | ORAL | 0 refills | Status: DC | PRN

## 2022-07-16 NOTE — Telephone Encounter (Signed)
Patient informed of medication change 

## 2022-07-16 NOTE — Telephone Encounter (Signed)
Ok this was changed to hydrocodone - done erx - please to let pt know

## 2022-08-01 ENCOUNTER — Encounter: Payer: Self-pay | Admitting: Internal Medicine

## 2022-08-01 ENCOUNTER — Ambulatory Visit: Payer: BC Managed Care – PPO | Admitting: Internal Medicine

## 2022-08-01 ENCOUNTER — Ambulatory Visit (INDEPENDENT_AMBULATORY_CARE_PROVIDER_SITE_OTHER): Payer: BC Managed Care – PPO | Admitting: Internal Medicine

## 2022-08-01 VITALS — BP 126/76 | HR 83 | Temp 99.4°F | Ht 60.0 in | Wt 125.0 lb

## 2022-08-01 DIAGNOSIS — E7800 Pure hypercholesterolemia, unspecified: Secondary | ICD-10-CM

## 2022-08-01 DIAGNOSIS — Z23 Encounter for immunization: Secondary | ICD-10-CM | POA: Diagnosis not present

## 2022-08-01 DIAGNOSIS — E1165 Type 2 diabetes mellitus with hyperglycemia: Secondary | ICD-10-CM | POA: Diagnosis not present

## 2022-08-01 DIAGNOSIS — S2232XD Fracture of one rib, left side, subsequent encounter for fracture with routine healing: Secondary | ICD-10-CM

## 2022-08-01 DIAGNOSIS — S2232XA Fracture of one rib, left side, initial encounter for closed fracture: Secondary | ICD-10-CM

## 2022-08-01 HISTORY — DX: Fracture of one rib, left side, initial encounter for closed fracture: S22.32XA

## 2022-08-01 NOTE — Assessment & Plan Note (Signed)
Lab Results  Component Value Date   LDLCALC 48 09/25/2021   Stable, pt to continue current statin lovastatin 40 mg qd  
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
## 2022-08-01 NOTE — Patient Instructions (Signed)
You had the flu shot today  Please continue all other medications as before, and refills have been done if requested.  Please have the pharmacy call with any other refills you may need.  Please continue your efforts at being more active, low cholesterol diet, and weight control..  Please keep your appointments with your specialists as you may have planned   

## 2022-08-01 NOTE — Assessment & Plan Note (Addendum)
Lab Results  Component Value Date   HGBA1C 8.9 (H) 03/15/2022   Uncontrolled A1c, pt working on better diet,  pt to continue current medical treatment glucotrol xl 5 mg qd, semglee 14 qhs , metformin 1000 bid, and f/u endo
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
## 2022-08-20 ENCOUNTER — Encounter: Payer: Self-pay | Admitting: Internal Medicine

## 2022-08-20 ENCOUNTER — Ambulatory Visit (INDEPENDENT_AMBULATORY_CARE_PROVIDER_SITE_OTHER): Payer: BC Managed Care – PPO | Admitting: Internal Medicine

## 2022-08-20 ENCOUNTER — Other Ambulatory Visit: Payer: Self-pay | Admitting: Internal Medicine

## 2022-08-20 VITALS — BP 124/70 | HR 75 | Ht 60.0 in | Wt 123.0 lb

## 2022-08-20 DIAGNOSIS — Z794 Long term (current) use of insulin: Secondary | ICD-10-CM

## 2022-08-20 DIAGNOSIS — E1165 Type 2 diabetes mellitus with hyperglycemia: Secondary | ICD-10-CM

## 2022-08-20 LAB — POCT GLYCOSYLATED HEMOGLOBIN (HGB A1C): Hemoglobin A1C: 7.7 % — AB (ref 4.0–5.6)

## 2022-08-20 MED ORDER — LANTUS SOLOSTAR 100 UNIT/ML ~~LOC~~ SOPN: 9.0000 [IU] | PEN_INJECTOR | Freq: Every day | SUBCUTANEOUS | 3 refills | Status: DC

## 2022-08-20 MED ORDER — METFORMIN HCL 1000 MG PO TABS: 1000.0000 mg | ORAL_TABLET | Freq: Two times a day (BID) | ORAL | 3 refills | Status: DC

## 2022-08-20 MED ORDER — GLIPIZIDE 5 MG PO TABS: 5.0000 mg | ORAL_TABLET | Freq: Two times a day (BID) | ORAL | 3 refills | Status: DC

## 2022-08-20 MED ORDER — INSULIN GLARGINE-YFGN 100 UNIT/ML ~~LOC~~ SOLN: 9.0000 [IU] | Freq: Every day | SUBCUTANEOUS | 6 refills | Status: DC

## 2022-08-20 NOTE — Progress Notes (Signed)
Name: Ruth Crawford  Age/ Sex: 66 y.o., female   MRN/ DOB: [id number], 06/28/1956     PCP: Corwin Levins, MD   Reason for Endocrinology Evaluation: Type 2 Diabetes Mellitus  Initial Endocrine Consultative Visit: 02/13/2021    PATIENT IDENTIFIER: Ruth Crawford is a 66 y.o. female with a past medical history of T2Dm, sickle cell trait, RLS, and Dyslipidemia . The patient has followed with Endocrinology clinic since 02/13/2021 for consultative assistance with management of her diabetes.    DIABETIC HISTORY:  Ms. Ewton was diagnosed with DM in 2008. Her hemoglobin A1c has ranged from  6.4% in 2015, peaking at 10.9% in 2022.    On her initial visit to our clinic her A1c was  10.9% , she was on Invokana, Metformin and Glipizide. We switched Glipizide to basal insulin , Invokana was cost prohibitive. Continued Metformin  Intolerant to Pioglitazone   GAD-65 and Iselt cell antibodies negative    Trulicity started 05/2021 but developed local reaction, started ozempic 09/2021 but had sto stop it 10/2021 due to cost    Started Glipizide 03/2022 SUBJECTIVE:   During the last visit (04/03/2022): A1c 8.9%    Today (08/20/2022): [name] is here for a follow up on diabetes management.  She checks her blood sugars multiple  times daily, through CGM . The patient has had hypoglycemic episodes since the last clinic visit., overnight.     She has recent diarrhea due to a virus, husband is sick as well     HOME DIABETES REGIMEN:  Glipizide 5 mg daily  Metformin 1000 mg twice a day Semglee 12 units once  daily      Statin: yes ACE-I/ARB: yes    CONTINUOUS GLUCOSE MONITORING RECORD INTERPRETATION    Dates of Recording: 11/22-12/01/2022  Sensor description:frestyle libre   Results statistics:   CGM use % of time 89  Average and SD 173/36.3  Time in range 55 %  % Time Above 180 33  % Time above 250 11  % Time Below target 1    Glycemic patterns summary: BG's optimal overnight  but high during the day   Hyperglycemic episodes  post prandial   Hypoglycemic episodes occurred variable   Overnight periods: trends down        DIABETIC COMPLICATIONS: Microvascular complications:   Denies: CKD, retinopathy, neuropathy Last Eye Exam: Completed 2022  Macrovascular complications:   Denies: CAD, CVA, PVD   HISTORY:  Past Medical History:  Past Medical History:  Diagnosis Date   Allergy    ANXIETY    COMMON MIGRAINE    DEPRESSION    DIABETES MELLITUS, TYPE II    GERD (gastroesophageal reflux disease) 10/17/2012   HYPERLIPIDEMIA    HYPERTENSION    IDA (iron deficiency anemia)    POLYP, GALLBLADDER    RLS (restless legs syndrome)    Sickle cell trait (HCC)    Past Surgical History:  Past Surgical History:  Procedure Laterality Date   ABDOMINAL HYSTERECTOMY  2000   COLONOSCOPY     LUMBAR DISC SURGERY  1998, 2016, 2017   s/p   Social History:  reports that she has never smoked. She has never used smokeless tobacco. She reports current alcohol use. She reports that she does not use drugs. Family History:  Family History  Problem Relation Age of Onset   Diabetes Mother    Liver cancer Mother    Arthritis Mother    Alcohol abuse Mother    Heart disease  Father    Heart attack Father    Stroke Father    Diabetes Brother    Colon cancer Neg Hx    Esophageal cancer Neg Hx    Rectal cancer Neg Hx    Stomach cancer Neg Hx      HOME MEDICATIONS: Allergies as of 08/20/2022       Reactions   Actos [pioglitazone]    dizziness   Demerol [meperidine] Other (See Comments)   Per pt: unknown   Trulicity [dulaglutide] Dermatitis   Prednisone Itching, Rash        Medication List        Accurate as of August 20, 2022  2:10 PM. If you have any questions, ask your nurse or doctor.          azelastine 0.05 % ophthalmic solution Commonly known as: OPTIVAR Place 1 drop into both eyes 2 (two) times daily.   co-enzyme Q-10 30 MG  capsule Take 30 mg by mouth 3 (three) times daily.   cyanocobalamin 1000 MCG tablet Commonly known as: VITAMIN B12 Take 1 tablet (1,000 mcg total) by mouth daily.   cyclobenzaprine 5 MG tablet Commonly known as: FLEXERIL Take 1 tablet (5 mg total) by mouth 3 (three) times daily as needed.   diclofenac Sodium 2 % Soln Commonly known as: PENNSAID Place 2 g onto the skin 2 (two) times daily.   diphenhydrAMINE 25 MG tablet Commonly known as: BENADRYL Take 25 mg by mouth daily as needed.   DULoxetine 30 MG capsule Commonly known as: CYMBALTA Take 1 capsule by mouth once daily   EXCEDRIN MIGRAINE PO Take by mouth as needed.   famotidine 20 MG tablet Commonly known as: Pepcid Take 1 tablet (20 mg total) by mouth 2 (two) times daily.   FreeStyle Calpine CorporationLibre 2 Sensor Misc USE ONE AS DIRECTED   FreeStyle Libre 2 Sensor Misc See admin instructions.   FreeStyle Libre Reader Hardie Pulleyevi Apply 1 Device topically every 14 (fourteen) days. E11.9   glipiZIDE 5 MG tablet Commonly known as: GLUCOTROL Take 1 tablet (5 mg total) by mouth daily before breakfast.   hydrOXYzine 10 MG tablet Commonly known as: ATARAX Take 1 tablet (10 mg total) by mouth 3 (three) times daily as needed.   Lancets Misc Use as directed daily E11.9   lisinopril 20 MG tablet Commonly known as: ZESTRIL Take 1 tablet by mouth once daily   lovastatin 40 MG tablet Commonly known as: MEVACOR Take 1 tablet by mouth once daily   magnesium oxide 400 MG tablet Commonly known as: MAG-OX Take 400 mg by mouth daily.   metFORMIN 1000 MG tablet Commonly known as: GLUCOPHAGE Take 1 tablet (1,000 mg total) by mouth 2 (two) times daily with a meal.   omega-3 acid ethyl esters 1 g capsule Commonly known as: LOVAZA Take by mouth daily.   omeprazole 40 MG capsule Commonly known as: PRILOSEC TAKE 1 CAPSULE BY MOUTH ONCE DAILY . APPOINTMENT REQUIRED FOR FUTURE REFILLS   ondansetron 4 MG tablet Commonly known as:  Zofran Take 1 tablet (4 mg total) by mouth every 8 (eight) hours as needed for nausea or vomiting.   OneTouch Verio test strip Generic drug: glucose blood Use as instructed daily E11.9   ReliOn Pen Needles 32G X 4 MM Misc Generic drug: Insulin Pen Needle USE 1 ONCE DAILY   rOPINIRole 1 MG tablet Commonly known as: REQUIP TAKE 1 TABLET BY MOUTH ONCE DAILY AT BEDTIME   Semglee (yfgn) 100 UNIT/ML injection  Generic drug: insulin glargine-yfgn Inject 12 Units into the skin at bedtime. What changed: Another medication with the same name was removed. Continue taking this medication, and follow the directions you see here. Changed by: Scarlette Shorts, MD   triamcinolone 55 MCG/ACT Aero nasal inhaler Commonly known as: NASACORT Place 2 sprays into the nose daily.         OBJECTIVE:   Vital Signs: BP 124/70 (BP Location: Left Arm, Patient Position: Sitting, Cuff Size: Small)   Pulse 75   Ht 5' (1.524 m)   Wt 123 lb (55.8 kg)   SpO2 96%   BMI 24.02 kg/m   Wt Readings from Last 3 Encounters:  08/20/22 123 lb (55.8 kg)  08/01/22 125 lb (56.7 kg)  06/21/22 123 lb (55.8 kg)     Exam: General: Pt appears well and is in NAD  Lungs: Clear with good BS bilat with no rales, rhonchi, or wheezes  Heart: RRR   Abdomen: Normoactive bowel sounds, soft, nontender, without masses or organomegaly palpable  Extremities: No pretibial edema. No tremor. Normal strength and motion throughout. See detailed diabetic foot exam below.  Neuro: MS is good with appropriate affect, pt is alert and Ox3     DM foot exam: 02/13/2021     The skin of the feet is intact without sores or ulcerations. The pedal pulses are 2+ on right and 2+ on left. The sensation is intact to a screening 5.07, 10 gram monofilament bilaterally       DATA REVIEWED:  Lab Results  Component Value Date   HGBA1C 8.9 (H) 03/15/2022   HGBA1C 6.9 (H) 09/25/2021   HGBA1C 9.6 (A) 06/05/2021    Latest Reference Range  & Units 03/15/22 14:57  Sodium 135 - 145 mEq/L 139  Potassium 3.5 - 5.1 mEq/L 4.1  Chloride 96 - 112 mEq/L 103  CO2 19 - 32 mEq/L 30  Glucose 70 - 99 mg/dL 161 (H)  BUN 6 - 23 mg/dL 16  Creatinine 0.96 - 0.45 mg/dL 4.09  Calcium 8.4 - 81.1 mg/dL 9.3  Alkaline Phosphatase 39 - 117 U/L 56  Albumin 3.5 - 5.2 g/dL 4.0  AST 0 - 37 U/L 11  ALT 0 - 35 U/L 8  Total Protein 6.0 - 8.3 g/dL 7.0  Bilirubin, Direct 0.0 - 0.3 mg/dL 0.0  Total Bilirubin 0.2 - 1.2 mg/dL 0.3  GFR >91.47 mL/min 78.14  (H): Data is abnormally high Results for MAKINZE, JANI (MRN [id number]) as of 06/05/2021 07:31  Ref. Range 02/13/2021 10:45  ISLET CELL ANTIBODY SCREEN Latest Ref Range: NEGATIVE  NEGATIVE   Glutamic Acid Decarb Ab <5 IU/mL <5     ASSESSMENT / PLAN / RECOMMENDATIONS:   1) Type 2 Diabetes Mellitus, Sub-Optimally  controlled, Without complications - Most recent A1c of 7.7 %. Goal A1c < 7.0 %.    - A1c down from  8.9% to 7.7% but remains above goal  - Intolerant to Pioglitazone  -Unfortunately she has local allergic reaction to Trulicity injections -Ozempic and SGLT2 inhibitors have been cost prohibitive - In reviewing her CGM down  she has been noted with hypoglycemia at night and between meals and postprandial hyperglycemia - Will increase Glipizide as below and reduce insulin as below    MEDICATIONS:  Increase glipizide 5 mg, 1 tablet BID Decrease Semglee 9 units daily Continue Metformin 1000 mg twice    EDUCATION / INSTRUCTIONS: BG monitoring instructions: Patient is instructed to check her blood sugars 3 times a  day, before meals . Call [facility] if: BG persistently < 70  I reviewed the Rule of 15 for the treatment of hypoglycemia in detail with the patient. Literature supplied.  2) Diabetic complications:  Eye: Does not have known diabetic retinopathy.  Neuro/ Feet: Does not have known diabetic peripheral neuropathy .  Renal: Patient does not have known baseline  CKD. She   is  on an ACEI/ARB at present.     F/U in 4 months     Signed electronically by: Lyndle Herrlich, MD  [facility] End[facility]  [facility] [address] [place] [place], [place] [address] Phone: [phone] FAX: [phone]   CC: Corwin Levins, MD [address] [place] [address] Phone: [phone]  Fax: [phone]  Return to Endocrinology clinic as below: Future Appointments  Date Time Provider Department Center  08/21/2022  2:50 PM Drema Dallas, DO [facility] None  10/07/2022  1:00 PM Corwin Levins, MD [facility] None  10/18/2022  1:30 PM Derrell Lolling, Rolin Barry, RD [facility]

## 2022-08-20 NOTE — Patient Instructions (Addendum)
-   Increase Glipizide 5 mg, 1 tablet before Breakfast and 1 tablet before Supper  - Continue Metformin 1000 mg twice daily  - Decrease Semglee  9 units once  daily     HOW TO TREAT LOW BLOOD SUGARS (Blood sugar LESS THAN 70 MG/DL) Please follow the RULE OF 15 for the treatment of hypoglycemia treatment (when your (blood sugars are less than 70 mg/dL)   STEP 1: Take 15 grams of carbohydrates when your blood sugar is low, which includes:  3-4 GLUCOSE TABS  OR 3-4 OZ OF JUICE OR REGULAR SODA OR ONE TUBE OF GLUCOSE GEL    STEP 2: RECHECK blood sugar in 15 MINUTES STEP 3: If your blood sugar is still low at the 15 minute recheck --> then, go back to STEP 1 and treat AGAIN with another 15 grams of carbohydrates.

## 2022-08-20 NOTE — Progress Notes (Unsigned)
NEUROLOGY CONSULTATION NOTE  Ruth Crawford MRN: [id number] DOB: [birth year]  Referring provider: Christia Reading, MD Primary care provider: Oliver Barre, MD  Reason for consult:  Loss sense of taste  Assessment/Plan:   [place] - unclear etiology.  I do not suspect intracranial etiology.  Given that symptoms resolved and her neurologic exam is normal, brain scan not indicated at this time Chronic daily headaches/morning headaches - definitely component of medication-overuse headache.  Given that they tend to occur when she wakes up in the morning and she endorses excessive daytime sleepiness, also consider sleep apnea.   1  Refer to sleep medicine for evaluation of sleep apnea 2  Will have her taper down on Excedrin to no more than 2 days out of the week 3  May sometimes try treating headache attacks with cyclobenzaprine instead of Excedrin. 4  Defer starting a daily preventative for now. 5  Follow up in 4 to 5 months.   Subjective:  Ruth Crawford is a 66 year old right-handed female with DM II, HTN, HLD, RLS, Sickle cell trait, migraine, depression and anxiety who presents for loss of sense of taste.  History supplemented by referring provider's note.  Around April, she began losing her sense of taste.  Her sense of smell was intact.  She wears a bite guard at night and has been biting into her tongue.  Also has dry mouth.  She wasn't sure if that was the cause.  She saw ENT and her dentist who did not find a specific cause.  No new medications.  She has history of iron deficiency anemia, which is controlled.  She takes B12 supplementation.  She has diabetes, which has been uncontrolled.  Most recent Hgb A1c from 03/15/2022 was 8.9.  She takes glipizide and metformin.  She takes duloxetine for depression/anxiety.  She takes cyclobenzaprine for her RLS.  TSH from 09/25/2021 was 1.05.  She last had COVID in September 2022 but not recently.  No visual disturbance, dizziness or lateralizing symptoms.   In October, she started tasting again. It had then gradually improved.    She has history of daily headaches for several years.  She wakes up every morning with headache.  It is a pressure across her head and around her eyes.  He notes nasal congestion.  If she takes Excedrin and decongestant, it will resolve quickly, otherwise it may progress to a throbbing pain.  No neck pain.  No nausea, vomiting, photophobia, phonophobia, or visual disturbance.  She takes Excedrin everyday.  On occasion, she will not wake up with a headache but will get one later in the day. She reports excessive daytime sleepiness.       PAST MEDICAL HISTORY: Past Medical History:  Diagnosis Date   Allergy    ANXIETY    COMMON MIGRAINE    DEPRESSION    DIABETES MELLITUS, TYPE II    GERD (gastroesophageal reflux disease) 10/17/2012   HYPERLIPIDEMIA    HYPERTENSION    IDA (iron deficiency anemia)    POLYP, GALLBLADDER    RLS (restless legs syndrome)    Sickle cell trait (HCC)     PAST SURGICAL HISTORY: Past Surgical History:  Procedure Laterality Date   ABDOMINAL HYSTERECTOMY  2000   COLONOSCOPY     LUMBAR DISC SURGERY  1998, 2016, 2017   s/p    MEDICATIONS: Current Outpatient Medications on File Prior to Visit  Medication Sig Dispense Refill   Aspirin-Acetaminophen-Caffeine (EXCEDRIN MIGRAINE PO) Take by mouth as  needed.     azelastine (OPTIVAR) 0.05 % ophthalmic solution Place 1 drop into both eyes 2 (two) times daily. 6 mL 12   co-enzyme Q-10 30 MG capsule Take 30 mg by mouth 3 (three) times daily.     Continuous Blood Gluc Receiver (FREESTYLE LIBRE READER) DEVI Apply 1 Device topically every 14 (fourteen) days. E11.9 6 each 3   Continuous Blood Gluc Sensor (FREESTYLE LIBRE 2 SENSOR) MISC USE ONE AS DIRECTED 6 each 2   Continuous Blood Gluc Sensor (FREESTYLE LIBRE 2 SENSOR) MISC See admin instructions.     cyclobenzaprine (FLEXERIL) 5 MG tablet Take 1 tablet (5 mg total) by mouth 3 (three) times daily as  needed. 40 tablet 1   Diclofenac Sodium 2 % SOLN Place 2 g onto the skin 2 (two) times daily. 112 g 3   diphenhydrAMINE (BENADRYL) 25 MG tablet Take 25 mg by mouth daily as needed.     DULoxetine (CYMBALTA) 30 MG capsule Take 1 capsule by mouth once daily 90 capsule 2   famotidine (PEPCID) 20 MG tablet Take 1 tablet (20 mg total) by mouth 2 (two) times daily. 180 tablet 3   glipiZIDE (GLUCOTROL) 5 MG tablet Take 1 tablet (5 mg total) by mouth daily before breakfast. 90 tablet 3   glucose blood (ONETOUCH VERIO) test strip Use as instructed daily E11.9 100 each 12   hydrOXYzine (ATARAX/VISTARIL) 10 MG tablet Take 1 tablet (10 mg total) by mouth 3 (three) times daily as needed. 30 tablet 0   insulin glargine-yfgn (SEMGLEE) 100 UNIT/ML Pen INJECT 16 UNITS SUBCUTANEOUSLY ONCE DAILY 15 mL 1   insulin glargine-yfgn (SEMGLEE, YFGN,) 100 UNIT/ML injection Inject 14 Units into the skin at bedtime.     Lancets MISC Use as directed daily E11.9 100 each 0   lisinopril (ZESTRIL) 20 MG tablet Take 1 tablet by mouth once daily 90 tablet 2   lovastatin (MEVACOR) 40 MG tablet Take 1 tablet by mouth once daily 90 tablet 2   magnesium oxide (MAG-OX) 400 MG tablet Take 400 mg by mouth daily.     metFORMIN (GLUCOPHAGE) 1000 MG tablet Take 1 tablet (1,000 mg total) by mouth 2 (two) times daily with a meal. 180 tablet 1   omega-3 acid ethyl esters (LOVAZA) 1 g capsule Take by mouth daily.     omeprazole (PRILOSEC) 40 MG capsule TAKE 1 CAPSULE BY MOUTH ONCE DAILY . APPOINTMENT REQUIRED FOR FUTURE REFILLS 90 capsule 3   ondansetron (ZOFRAN) 4 MG tablet Take 1 tablet (4 mg total) by mouth every 8 (eight) hours as needed for nausea or vomiting. 30 tablet 0   RELION PEN NEEDLES 32G X 4 MM MISC USE 1 ONCE DAILY 50 each 1   rOPINIRole (REQUIP) 1 MG tablet TAKE 1 TABLET BY MOUTH ONCE DAILY AT BEDTIME 90 tablet 2   triamcinolone (NASACORT) 55 MCG/ACT AERO nasal inhaler Place 2 sprays into the nose daily. 1 each 12   vitamin  B-12 (CYANOCOBALAMIN) 1000 MCG tablet Take 1 tablet (1,000 mcg total) by mouth daily. 90 tablet 3   No current facility-administered medications on file prior to visit.    ALLERGIES: Allergies  Allergen Reactions   Actos [Pioglitazone]     dizziness   Demerol [Meperidine] Other (See Comments)    Per pt: unknown   Trulicity [Dulaglutide] Dermatitis   Prednisone Itching and Rash    FAMILY HISTORY: Family History  Problem Relation Age of Onset   Diabetes Mother    Liver cancer  Mother    Arthritis Mother    Alcohol abuse Mother    Heart disease Father    Heart attack Father    Stroke Father    Diabetes Brother    Colon cancer Neg Hx    Esophageal cancer Neg Hx    Rectal cancer Neg Hx    Stomach cancer Neg Hx     Objective:  Blood pressure (!) 178/79, pulse (!) 113, height 5' (1.524 m), weight 124 lb 12.8 oz (56.6 kg), SpO2 98 %. General: No acute distress.  Patient appears well-groomed.   Head:  Normocephalic/atraumatic Eyes:  fundi examined but not visualized Neck: supple, no paraspinal tenderness, full range of motion Back: No paraspinal tenderness Heart: regular rate and rhythm Lungs: Clear to auscultation bilaterally. Vascular: No carotid bruits. Neurological Exam: Mental status: alert and oriented to person, place, and time, speech fluent and not dysarthric, language intact. Cranial nerves: CN I: not tested CN II: pupils equal, round and reactive to light, visual fields intact CN III, IV, VI:  full range of motion, no nystagmus, no ptosis CN V: facial sensation intact. CN VII: upper and lower face symmetric CN VIII: hearing intact CN IX, X: gag intact, uvula midline CN XI: sternocleidomastoid and trapezius muscles intact CN XII: tongue midline Bulk & Tone: normal, no fasciculations. Motor:  muscle strength 5/5 throughout Sensation:  Pinprick, temperature and vibratory sensation intact. Deep Tendon Reflexes:  2+ throughout,  toes downgoing.   Finger to nose  testing:  Without dysmetria.   Heel to shin:  Without dysmetria.   Gait:  Normal station and stride.  Romberg negative.    Thank you for allowing me to take part in the care of this patient.  Shon Millet, DO  CC:  Oliver Barre, MD  Christia Reading, MD

## 2022-08-20 NOTE — Addendum Note (Signed)
Addended by: Scarlette Shorts on: 08/20/2022 03:22 PM   Modules accepted: Orders

## 2022-08-21 ENCOUNTER — Encounter: Payer: Self-pay | Admitting: Neurology

## 2022-08-21 ENCOUNTER — Ambulatory Visit (INDEPENDENT_AMBULATORY_CARE_PROVIDER_SITE_OTHER): Payer: BC Managed Care – PPO | Admitting: Neurology

## 2022-08-21 VITALS — BP 178/79 | HR 113 | Ht 60.0 in | Wt 124.8 lb

## 2022-08-21 DIAGNOSIS — G4719 Other hypersomnia: Secondary | ICD-10-CM

## 2022-08-21 DIAGNOSIS — R432 Parageusia: Secondary | ICD-10-CM

## 2022-08-21 DIAGNOSIS — G4440 Drug-induced headache, not elsewhere classified, not intractable: Secondary | ICD-10-CM

## 2022-08-21 NOTE — Patient Instructions (Signed)
Refer to sleep medicine for evaluation of sleep apnea Taper down on Excedrin to no more than 2 days out of week: Take no more than 6 days this week (week 1) Then no more than 5 days next week (week 2) Then no more than 4 days on week 3 Then no more than 3 days on week 4 Then no more than 2 days beginning week 5 When you get a headache, may take a cyclobenzaprine instead of the Excedrin Follow up 4-5 months.

## 2022-08-27 ENCOUNTER — Other Ambulatory Visit: Payer: Self-pay | Admitting: Internal Medicine

## 2022-08-31 ENCOUNTER — Other Ambulatory Visit: Payer: Self-pay | Admitting: Internal Medicine

## 2022-08-31 NOTE — Telephone Encounter (Signed)
Please refill as per office routine med refill policy (all routine meds to be refilled for 3 mo or monthly (per pt preference) up to one year from last visit, then month to month grace period for 3 mo, then further med refills will have to be denied) ? ?

## 2022-10-07 ENCOUNTER — Other Ambulatory Visit: Payer: Self-pay | Admitting: Internal Medicine

## 2022-10-07 ENCOUNTER — Encounter: Payer: Self-pay | Admitting: Internal Medicine

## 2022-10-07 ENCOUNTER — Ambulatory Visit (INDEPENDENT_AMBULATORY_CARE_PROVIDER_SITE_OTHER): Payer: BC Managed Care – PPO | Admitting: Internal Medicine

## 2022-10-07 VITALS — BP 124/66 | HR 98 | Temp 98.3°F | Ht 60.0 in | Wt 124.0 lb

## 2022-10-07 DIAGNOSIS — E559 Vitamin D deficiency, unspecified: Secondary | ICD-10-CM | POA: Diagnosis not present

## 2022-10-07 DIAGNOSIS — K219 Gastro-esophageal reflux disease without esophagitis: Secondary | ICD-10-CM

## 2022-10-07 DIAGNOSIS — E538 Deficiency of other specified B group vitamins: Secondary | ICD-10-CM | POA: Diagnosis not present

## 2022-10-07 DIAGNOSIS — E611 Iron deficiency: Secondary | ICD-10-CM

## 2022-10-07 DIAGNOSIS — E1165 Type 2 diabetes mellitus with hyperglycemia: Secondary | ICD-10-CM | POA: Diagnosis not present

## 2022-10-07 DIAGNOSIS — K253 Acute gastric ulcer without hemorrhage or perforation: Secondary | ICD-10-CM | POA: Diagnosis not present

## 2022-10-07 DIAGNOSIS — E7800 Pure hypercholesterolemia, unspecified: Secondary | ICD-10-CM

## 2022-10-07 DIAGNOSIS — I10 Essential (primary) hypertension: Secondary | ICD-10-CM

## 2022-10-07 DIAGNOSIS — Z0001 Encounter for general adult medical examination with abnormal findings: Secondary | ICD-10-CM

## 2022-10-07 LAB — BASIC METABOLIC PANEL
BUN: 16 mg/dL (ref 6–23)
CO2: 29 mEq/L (ref 19–32)
Calcium: 9.3 mg/dL (ref 8.4–10.5)
Chloride: 103 mEq/L (ref 96–112)
Creatinine, Ser: 0.83 mg/dL (ref 0.40–1.20)
GFR: 73.36 mL/min (ref >60.00)
Glucose, Bld: 209 mg/dL — ABNORMAL HIGH (ref 70–99)
Potassium: 3.7 mEq/L (ref 3.5–5.1)
Sodium: 140 mEq/L (ref 135–145)

## 2022-10-07 LAB — URINALYSIS, ROUTINE W REFLEX MICROSCOPIC
Bilirubin Urine: NEGATIVE
Hgb urine dipstick: NEGATIVE
Ketones, ur: NEGATIVE
Leukocytes,Ua: NEGATIVE
Nitrite: NEGATIVE
RBC / HPF: NONE SEEN (ref 0–?)
Specific Gravity, Urine: 1.02 (ref 1.000–1.030)
Total Protein, Urine: NEGATIVE
Urine Glucose: >=1000 — AB
Urobilinogen, UA: 0.2 (ref 0.0–1.0)
WBC, UA: NONE SEEN (ref 0–?)
pH: 5.5 (ref 5.0–8.0)

## 2022-10-07 LAB — VITAMIN B12: Vitamin B-12: 552 pg/mL (ref 211–911)

## 2022-10-07 LAB — CBC WITH DIFFERENTIAL/PLATELET
Basophils Absolute: 0.1 10*3/uL (ref 0.0–0.1)
Basophils Relative: 1.7 % (ref 0.0–3.0)
Eosinophils Absolute: 0.4 10*3/uL (ref 0.0–0.7)
Eosinophils Relative: 4.2 % (ref 0.0–5.0)
HCT: 34.8 % — ABNORMAL LOW (ref 36.0–46.0)
Hemoglobin: 11.5 g/dL — ABNORMAL LOW (ref 12.0–15.0)
Lymphocytes Relative: 32.8 % (ref 12.0–46.0)
Lymphs Abs: 2.9 10*3/uL (ref 0.7–4.0)
MCHC: 33 g/dL (ref 30.0–36.0)
MCV: 80.1 fl (ref 78.0–100.0)
Monocytes Absolute: 0.6 10*3/uL (ref 0.1–1.0)
Monocytes Relative: 6.6 % (ref 3.0–12.0)
Neutro Abs: 4.8 10*3/uL (ref 1.4–7.7)
Neutrophils Relative %: 54.7 % (ref 43.0–77.0)
Platelets: 478 10*3/uL — ABNORMAL HIGH (ref 150.0–400.0)
RBC: 4.34 Mil/uL (ref 3.87–5.11)
RDW: 15.1 % (ref 11.5–15.5)
WBC: 8.8 10*3/uL (ref 4.0–10.5)

## 2022-10-07 LAB — IBC PANEL
Iron: 54 ug/dL (ref 42–145)
Saturation Ratios: 11.9 % — ABNORMAL LOW (ref 20.0–50.0)
TIBC: 453.6 ug/dL — ABNORMAL HIGH (ref 250.0–450.0)
Transferrin: 324 mg/dL (ref 212.0–360.0)

## 2022-10-07 LAB — HEPATIC FUNCTION PANEL
ALT: 9 U/L (ref 0–35)
AST: 14 U/L (ref 0–37)
Albumin: 4.4 g/dL (ref 3.5–5.2)
Alkaline Phosphatase: 58 U/L (ref 39–117)
Bilirubin, Direct: 0.1 mg/dL (ref 0.0–0.3)
Total Bilirubin: 0.4 mg/dL (ref 0.2–1.2)
Total Protein: 7.6 g/dL (ref 6.0–8.3)

## 2022-10-07 LAB — FERRITIN: Ferritin: 15.5 ng/mL (ref 10.0–291.0)

## 2022-10-07 LAB — MICROALBUMIN / CREATININE URINE RATIO
Creatinine,U: 107.4 mg/dL
Microalb Creat Ratio: 2.4 mg/g (ref 0.0–30.0)
Microalb, Ur: 2.6 mg/dL — ABNORMAL HIGH (ref 0.0–1.9)

## 2022-10-07 LAB — LIPID PANEL
Cholesterol: 139 mg/dL (ref 0–200)
HDL: 65.1 mg/dL (ref >39.00)
LDL Cholesterol: 47 mg/dL (ref 0–99)
NonHDL: 73.56
Total CHOL/HDL Ratio: 2
Triglycerides: 131 mg/dL (ref 0.0–149.0)
VLDL: 26.2 mg/dL (ref 0.0–40.0)

## 2022-10-07 LAB — VITAMIN D 25 HYDROXY (VIT D DEFICIENCY, FRACTURES): VITD: 43.1 ng/mL (ref 30.00–100.00)

## 2022-10-07 LAB — TSH: TSH: 0.45 u[IU]/mL (ref 0.35–5.50)

## 2022-10-07 LAB — HEMOGLOBIN A1C: Hgb A1c MFr Bld: 8 % — ABNORMAL HIGH (ref 4.6–6.5)

## 2022-10-07 MED ORDER — DULOXETINE HCL 30 MG PO CPEP: 30.0000 mg | ORAL_CAPSULE | Freq: Every day | ORAL | 3 refills | Status: DC

## 2022-10-07 MED ORDER — LISINOPRIL 20 MG PO TABS: 20.0000 mg | ORAL_TABLET | Freq: Every day | ORAL | 3 refills | Status: DC

## 2022-10-07 MED ORDER — METFORMIN HCL 1000 MG PO TABS: 1000.0000 mg | ORAL_TABLET | Freq: Two times a day (BID) | ORAL | 3 refills | Status: DC

## 2022-10-07 MED ORDER — ROPINIROLE HCL 1 MG PO TABS: 1.0000 mg | ORAL_TABLET | Freq: Every day | ORAL | 3 refills | Status: DC

## 2022-10-07 MED ORDER — OMEGA-3-ACID ETHYL ESTERS 1 G PO CAPS: 2.0000 g | ORAL_CAPSULE | Freq: Two times a day (BID) | ORAL | 3 refills | Status: DC

## 2022-10-07 MED ORDER — OMEPRAZOLE 40 MG PO CPDR: DELAYED_RELEASE_CAPSULE | ORAL | 3 refills | Status: DC

## 2022-10-07 MED ORDER — LOVASTATIN 40 MG PO TABS: 40.0000 mg | ORAL_TABLET | Freq: Every day | ORAL | 3 refills | Status: DC

## 2022-10-07 NOTE — Patient Instructions (Signed)
OK to take the famotidine at 1 tab in the evening to start with for a week, then try to stop after that if doing ok  Please continue all other medications as before, and refills have been done if requested.  Please have the pharmacy call with any other refills you may need.  Please continue your efforts at being more active, low cholesterol diet, and weight control.  You are otherwise up to date with prevention measures today.  Please keep your appointments with your specialists as you may have planned  Please go to the LAB at the blood drawing area for the tests to be done  You will be contacted by phone if any changes need to be made immediately.  Otherwise, you will receive a letter about your results with an explanation, but please check with MyChart first.  Please remember to sign up for MyChart if you have not done so, as this will be important to you in the future with finding out test results, communicating by private email, and scheduling acute appointments online when needed.  Please make an Appointment to return in 6 months, or sooner if needed

## 2022-10-07 NOTE — Assessment & Plan Note (Signed)
Lab Results  Component Value Date   LDLCALC 48 09/25/2021   Stable, pt to continue current statin lovastatin 40 mg qd

## 2022-10-07 NOTE — Assessment & Plan Note (Signed)
With some recent memory changes - for f/u lab

## 2022-10-07 NOTE — Assessment & Plan Note (Signed)
BP Readings from Last 3 Encounters:  10/07/22 124/66  08/21/22 (!) 178/79  08/20/22 124/70   Stable, pt to continue medical treatment lisionpril 20 mg qd

## 2022-10-07 NOTE — Assessment & Plan Note (Signed)
Lab Results  Component Value Date   HGBA1C 7.7 (A) 08/20/2022   uncontrolled, pt to continue current medical treatment glucotrol xl 5 qd, lantus 9 u daily, metformin 1000 bid

## 2022-10-07 NOTE — Assessment & Plan Note (Signed)
No recent overt bleeding, brusing  - for f/u lab

## 2022-10-07 NOTE — Assessment & Plan Note (Signed)
Age and sex appropriate education and counseling updated with regular exercise and diet Referrals for preventative services - has eye exam soon Immunizations addressed - none needed Smoking counseling  - none needed Evidence for depression or other mood disorder - none significant Most recent labs reviewed. I have personally reviewed and have noted: 1) the patient's medical and social history 2) The patient's current medications and supplements 3) The patient's height, weight, and BMI have been recorded in the chart

## 2022-10-07 NOTE — Progress Notes (Signed)
Patient ID: Ruth Crawford, female   DOB: 1955/11/02, 67 y.o.   MRN: [id number]         Chief Complaint:: wellness exam and dm, hypersomnolence, memory changes, gerd, hld, iron deficiency       HPI:  Ruth Crawford is a 67 y.o. female here for wellness exam; for eye exam soon, o/w up to date                        Also struggling with more memory lapses such as names at church, and misplace object.  Does c/o ongoing fatigue, but also signficant daytime hypersomnolence, saw neurology dec 2023 with possible sleep test coming up, she needs to check with them..  Pt denies chest pain, increased sob or doe, wheezing, orthopnea, PND, increased LE swelling, palpitations, dizziness or syncope.   Pt denies polydipsia, polyuria, or new focal neuro s/s.   Pt denies fever, wt loss, night sweats, loss of appetite, or other constitutional symptoms  Sees chiropracter every 2 wks for neck pain.  Denies worsening reflux, abd pain, dysphagia, n/v, bowel change or blood, and is asking if can reduce or stop the famotidine,.  Sees Endo on regular basis   Wt Readings from Last 3 Encounters:  10/07/22 124 lb (56.2 kg)  08/21/22 124 lb 12.8 oz (56.6 kg)  08/20/22 123 lb (55.8 kg)   BP Readings from Last 3 Encounters:  10/07/22 124/66  08/21/22 (!) 178/79  08/20/22 124/70   Immunization History  Administered Date(s) Administered   Fluad Quad(high Dose 65+) 08/01/2022   Influenza Split 07/22/2011, 08/18/2012, 06/10/2019   Influenza Whole 06/16/2009, 07/19/2013   Influenza,inj,Quad PF,6+ Mos 06/08/2014, 05/30/2015, 07/26/2016, 06/25/2017, 07/01/2018, 06/07/2019, 06/21/2020   Influenza-Unspecified 08/27/2021   PFIZER(Purple Top)SARS-COV-2 Vaccination 12/18/2019, 01/12/2020, 08/21/2020   PPD Test 09/03/2016   Pneumococcal Conjugate-13 08/26/2018   Pneumococcal Polysaccharide-23 11/02/2009, 04/19/2015, 10/02/2021   Td 11/02/2009   Tdap 06/21/2020   Zoster Recombinat (Shingrix) 04/13/2021, 06/25/2021   Health  Maintenance Due  Topic Date Due   Diabetic kidney evaluation - Urine ACR  09/25/2022      Past Medical History:  Diagnosis Date   Allergy    ANXIETY    COMMON MIGRAINE    DEPRESSION    DIABETES MELLITUS, TYPE II    GERD (gastroesophageal reflux disease) 10/17/2012   HYPERLIPIDEMIA    HYPERTENSION    IDA (iron deficiency anemia)    POLYP, GALLBLADDER    RLS (restless legs syndrome)    Sickle cell trait ([place])    Past Surgical History:  Procedure Laterality Date   ABDOMINAL HYSTERECTOMY  2000   [place], 2016, 2017   s/p    reports that she has never smoked. She has never used smokeless tobacco. She reports current alcohol use. She reports that she does not use drugs. family history includes Alcohol abuse in her mother; Arthritis in her mother; Diabetes in her brother and mother; Heart attack in her father; Heart disease in her father; Liver cancer in her mother. Allergies  Allergen Reactions   Actos [Pioglitazone]     dizziness   Demerol [Meperidine] Other (See Comments)    Per pt: unknown   Trulicity [Dulaglutide] Dermatitis   Prednisone Itching and Rash   Current Outpatient Medications on File Prior to Visit  Medication Sig Dispense Refill   Aspirin-Acetaminophen-Caffeine (EXCEDRIN MIGRAINE PO) Take by mouth as needed.     azelastine (OPTIVAR) 0.05 %  ophthalmic solution Place 1 drop into both eyes 2 (two) times daily. 6 mL 12   co-enzyme Q-10 30 MG capsule Take 30 mg by mouth 3 (three) times daily.     Continuous Blood Gluc Receiver (FREESTYLE LIBRE READER) DEVI Apply 1 Device topically every 14 (fourteen) days. E11.9 6 each 3   Continuous Blood Gluc Sensor (FREESTYLE LIBRE 2 SENSOR) MISC USE ONE AS DIRECTED 6 each 2   Continuous Blood Gluc Sensor (FREESTYLE LIBRE 2 SENSOR) MISC See admin instructions.     cyclobenzaprine (FLEXERIL) 5 MG tablet Take 1 tablet (5 mg total) by mouth 3 (three) times daily as needed. 40 tablet 1   Diclofenac  Sodium 2 % SOLN Place 2 g onto the skin 2 (two) times daily. 112 g 3   diphenhydrAMINE (BENADRYL) 25 MG tablet Take 25 mg by mouth daily as needed.     glipiZIDE (GLUCOTROL) 5 MG tablet Take 1 tablet (5 mg total) by mouth 2 (two) times daily before a meal. 180 tablet 3   glucose blood (ONETOUCH VERIO) test strip Use as instructed daily E11.9 100 each 12   hydrOXYzine (ATARAX/VISTARIL) 10 MG tablet Take 1 tablet (10 mg total) by mouth 3 (three) times daily as needed. 30 tablet 0   insulin glargine (LANTUS SOLOSTAR) 100 UNIT/ML Solostar Pen Inject 9 Units into the skin daily. 15 mL 3   insulin glargine-yfgn (SEMGLEE) 100 UNIT/ML Pen SMARTSIG:9 Unit(s) SUB-Q Daily     Insulin Pen Needle (RELION PEN NEEDLES) 32G X 4 MM MISC USE 1  ONCE DAILY 50 each 5   Lancets MISC Use as directed daily E11.9 100 each 0   magnesium oxide (MAG-OX) 400 MG tablet Take 400 mg by mouth daily.     ondansetron (ZOFRAN) 4 MG tablet Take 1 tablet (4 mg total) by mouth every 8 (eight) hours as needed for nausea or vomiting. 30 tablet 0   triamcinolone (NASACORT) 55 MCG/ACT AERO nasal inhaler Place 2 sprays into the nose daily. 1 each 12   vitamin B-12 (CYANOCOBALAMIN) 1000 MCG tablet Take 1 tablet (1,000 mcg total) by mouth daily. 90 tablet 3   No current facility-administered medications on file prior to visit.        ROS:  All others reviewed and negative.  Objective        PE:  BP 124/66 (BP Location: Left Arm, Patient Position: Sitting, Cuff Size: Large)   Pulse 98   Temp 98.3 F (36.8 C) (Oral)   Ht 5' (1.524 m)   Wt 124 lb (56.2 kg)   SpO2 95%   BMI 24.22 kg/m                 Constitutional: Pt appears in NAD               HENT: Head: NCAT.                Right Ear: External ear normal.                 Left Ear: External ear normal.                Eyes: . Pupils are equal, round, and reactive to light. Conjunctivae and EOM are normal               Nose: without d/c or deformity               Neck: Neck  supple. Gross normal ROM  Cardiovascular: Normal rate and regular rhythm.                 Pulmonary/Chest: Effort normal and breath sounds without rales or wheezing.                Abd:  Soft, NT, ND, + BS, no organomegaly               Neurological: Pt is alert. At baseline orientation, motor grossly intact               Skin: Skin is warm. No rashes, no other new lesions, LE edema - noine               Psychiatric: Pt behavior is normal without agitation   Micro: none  Cardiac tracings I have personally interpreted today:  none  Pertinent Radiological findings (summarize): none   Lab Results  Component Value Date   WBC 9.0 03/15/2022   HGB 10.7 (L) 03/15/2022   HCT 33.3 (L) 03/15/2022   PLT 399.0 03/15/2022   GLUCOSE 191 (H) 03/15/2022   CHOL 129 09/25/2021   TRIG 56.0 09/25/2021   HDL 69.60 09/25/2021   LDLCALC 48 09/25/2021   ALT 8 03/15/2022   AST 11 03/15/2022   NA 139 03/15/2022   K 4.1 03/15/2022   CL 103 03/15/2022   CREATININE 0.79 03/15/2022   BUN 16 03/15/2022   CO2 30 03/15/2022   TSH 1.05 09/25/2021   INR 1.0 08/26/2018   HGBA1C 7.7 (A) 08/20/2022   MICROALBUR 2.3 (H) 09/25/2021   Assessment/Plan:  Ruth Crawford is a 67 y.o. Black or African American [2] female with  has a past medical history of Allergy, ANXIETY, COMMON MIGRAINE, DEPRESSION, DIABETES MELLITUS, TYPE II, GERD (gastroesophageal reflux disease) (10/17/2012), HYPERLIPIDEMIA, HYPERTENSION, IDA (iron deficiency anemia), POLYP, GALLBLADDER, RLS (restless legs syndrome), and Sickle cell trait ([facility]).  Encounter for well adult exam with abnormal findings Age and sex appropriate education and counseling updated with regular exercise and diet Referrals for preventative services - has eye exam soon Immunizations addressed - none needed Smoking counseling  - none needed Evidence for depression or other mood disorder - none significant Most recent labs reviewed. I have personally reviewed and  have noted: 1) the patient's medical and social history 2) The patient's current medications and supplements 3) The patient's height, weight, and BMI have been recorded in the chart   B12 deficiency With some recent memory changes - for f/u lab  Diabetes Lab Results  Component Value Date   HGBA1C 7.7 (A) 08/20/2022   uncontrolled, pt to continue current medical treatment glucotrol xl 5 qd, lantus 9 u daily, metformin 1000 bid   Essential hypertension BP Readings from Last 3 Encounters:  10/07/22 124/66  08/21/22 (!) 178/79  08/20/22 124/70   Stable, pt to continue medical treatment lisionpril 20 mg qd   GERD (gastroesophageal reflux disease) Well controlled so ok to try reducine pepcid to qhs for 1 wk, then stop if doing ok on PPI only  Hyperlipidemia Lab Results  Component Value Date   LDLCALC 48 09/25/2021   Stable, pt to continue current statin lovastatin 40 mg qd   Iron deficiency No recent overt bleeding, brusing  - for f/u lab  Followup: No follow-ups on file.  Oliver Barre, MD 10/07/2022 1:50 PM [facility]

## 2022-10-07 NOTE — Assessment & Plan Note (Signed)
Well controlled so ok to try reducine pepcid to qhs for 1 wk, then stop if doing ok on PPI only

## 2022-10-08 ENCOUNTER — Telehealth: Payer: Self-pay | Admitting: Anesthesiology

## 2022-10-08 NOTE — Telephone Encounter (Signed)
Left message for patient to contact [facility]

## 2022-10-08 NOTE — Telephone Encounter (Signed)
Pt left message with AN stating she saw Dr Tomi Likens in December, her was supposed to send a referral for a sleep study and she has not heard back.

## 2022-10-08 NOTE — Telephone Encounter (Signed)
Dear Ruth Crawford,    [place] office, [facility], is reaching out regarding a referral our office received from Dr Vinson Moselle. If you wish to schedule an appointment, please contact our office at your earliest convenience.   Have a great day.   Thank you,  [facility]  [place] [place] [address] [phone]  Patient was sent a my chart message will call patient 10/08/2022

## 2022-10-09 ENCOUNTER — Telehealth: Payer: Self-pay | Admitting: Internal Medicine

## 2022-10-09 NOTE — Telephone Encounter (Signed)
States she has missed calls from [facility] to set up HST. Pls call @ [phone]

## 2022-10-10 NOTE — Telephone Encounter (Signed)
Called and spoke with pt about referral we received 12/21 from Dr. Georgie Chard office. Stated to her that we needed to get her reestablished with Dr. Annamaria Boots and she verbalized understanding. Appt scheduled for pt with CY. Nothing further needed.

## 2022-10-10 NOTE — Telephone Encounter (Signed)
Looks like Dr. Tomi Likens put in referral to [facility] on 09/05/2022 and someone has called 2x's and closed the referral. Patient was calling about a Pulmonary appt

## 2022-10-18 ENCOUNTER — Ambulatory Visit: Payer: BC Managed Care – PPO | Admitting: Dietician

## 2022-10-28 ENCOUNTER — Encounter: Payer: Self-pay | Admitting: Dietician

## 2022-10-28 ENCOUNTER — Encounter: Payer: BC Managed Care – PPO | Attending: Internal Medicine | Admitting: Dietician

## 2022-10-28 VITALS — Wt 123.0 lb

## 2022-10-28 DIAGNOSIS — E119 Type 2 diabetes mellitus without complications: Secondary | ICD-10-CM | POA: Diagnosis present

## 2022-10-28 DIAGNOSIS — Z794 Long term (current) use of insulin: Secondary | ICD-10-CM | POA: Insufficient documentation

## 2022-10-28 NOTE — Patient Instructions (Signed)
Eat more vegetables and fruit Decrease sweet intake Drink mostly water and other unsweetened beverages. Small portion of protein with each meal  Exercise as you are able.
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
## 2022-11-03 NOTE — Progress Notes (Signed)
HPI F never smoker. Previously followed for allergic rhiniits/ conjunctivitis and food allergy.As o     08/20/13- 67 yoF never smoker Ref by Dr Cathlean Cower - Eye redness and pain comes and goes - Some runny eyes - Denies itching - Occas Headaches - Denies sneezing She complains of red and painful eyes, persistent over 6 months but relieved by steroid eyedrops. Optometrist told her it might be allergy. She does not wear contact lenses. She improves 4 days or a week at a time but then comes back. No recent antihistamine. History of frequent sinus headaches. Spring and fall seasonal rhinitis treated over-the-counter. No history of asthma. Associates rash with anxiety. Denies any problems from lisinopril. Vision is okay. Denies arthritis. Environment: House with no basement, lactic heat, no mold. 2 dogs. No smokers. She is not working. Family history of allergy.  10/01/13-  67 yoF never smoker Ref by Dr Cathlean Cower - Eye redness and pain comes and goes - Some runny eyes - Denies itching - Occas Headaches - Denies sneezing FOLLOWS FOR: States that her allergy sx have not changed since last OV. She did not try lubricating gel for her eyes. Still has the same intermittent burning and watering.antihistamines and various nasal sprays have not helped much.  Now describes seasonal rhinitis since childhood. Generalized itching without visible rash intermittently since her eyes began bothering her.  Lab 08/20/2013-  ANA negative, sedimentation rate only 23, Allergy Profile 12//14- total IgE 353.1, broadly positive. We discussed a trial of allergy vaccine based on this.  12/07/13- 67 yoF never smoker followed for allergic conjunctivitis, allergic rhinitis, food allergy-banana, watermelon FOLLOWS FOR: doing well overall with allergies,still taking allergy shot, ha occass., nasal congestion yesterday-clear,no cough,denies wheezing Allergy vaccine 1:5000 build-up here Not much conjunctivitis so far this spring.  Occasional Benadryl. Minor hacking cough in the mornings but no wheeze. Banana and watermelon make her throat  Itch  03/25/14- 67 yoF never smoker followed for allergic conjunctivitis, allergic rhinitis, food allergy-banana, watermelon FOLLOWS FOR: still on Allergy vaccine 1:50 GH   and doing well. Her previous complaints about cough and irritation of her eyes are much better now.  07/29/14- 67 yoF never smoker followed for allergic conjunctivitis, allergic rhinitis, food allergy-banana, watermelon FOLLOWS FOR: Still on Allergy vaccine 1:10 GH-cant tell that they are helping and would like to discuss coming off of them. Got shot Wednesday this week and has a bruise, soreness, red area-put cool compress and Benadryl anti itch cream on it.  She has been on allergy vaccine about a year. Currently describing frontal headaches and sinus pressure. She takes an antihistamine +2 Excedrin to relieve headache. Not blowing her nose or sneezing. Bothersome postnasal drip when she first lies down. We are stopping her allergy vaccine..  01/27/15- 67 yoF never smoker followed for allergic conjunctivitis, allergic rhinitis, food allergy-banana, watermelon Follows For: Pt has had a cold x 2 weeks. C/o of right sided ear and jaw pain, prod cough with clear/yellow mucus. Pt wants to talk about new herbal med flora sinus for allergies. Allergy vaccine dc/d 07/29/14 CT sinus 08/05/15 IMPRESSION: No significant sinus fluid.  Nasal septal deviation of 2 mm. Electronically Signed   By: Rolla Flatten M.D.   On: 08/04/2014 10:  [birth year]2017-67 year old female never smoker followed for allergic conjunctivitis, allergic rhinitis, food allergy-banana, watermelon FOLLOWS FOR: Pt states she has allergies year round; continues using OTC allergy med and Excedrin Migraine prn for headaches-this helps hold her over.  11/05/22- 89 yoF for  sleep evaluation courtesy of Dr Tomi Likens, last seen in 2017 for allergy and coming now to  re-establish for sleep with concerns of daytime sleepiness, nocturnal hypoxemia. Medical problem list includes HTN, Allergic Rhinitis, GERD, DM2, Cervicl Radiculopathy, Hyperlipidemia, Restless Legs, Anxiety/Depreswsion,  Epworth score- Body weight today-121 lbs ------Pt states she is having memory issues. No issues with sleep. No previous sleep study She is concerned that sleep deprivation and possible sleep apnea might be contributing to memory changes.  Appropriate discussion done.  We will schedule sleep study.  ROS-see HPI Constitutional:   No-   weight loss, night sweats, fevers, chills, fatigue, lassitude. HEENT:   +  headaches, no-difficulty swallowing, tooth/dental problems, sore throat,       No-  sneezing, +itching, no-ear ache, +nasal congestion, +post nasal drip,  CV:  No-   chest pain, orthopnea, PND, swelling in lower extremities, anasarca, dizziness, palpitations Resp: No-   shortness of breath with exertion or at rest.              No-   productive cough,  No non-productive cough,  No- coughing up of blood.              No-   change in color of mucus.  No- wheezing.   Skin: +occasional rash . GI:  No-   heartburn, indigestion, abdominal pain, nausea, vomiting,  GU:  MS:  No-   joint pain or swelling.  No- decreased range of motion.  Neuro-     nothing unusual Psych:  No- change in mood or affect. No depression or anxiety.  No memory loss.  OBJ- Physical Exam General- Alert, Oriented, Affect-appropriate, Distress- none acute Skin- rash-none, lesions- none, excoriation- none Lymphadenopathy- none Head- atraumatic            Eyes- Gross vision intact, PERRLA, conjunctivae-not red/ injected                            + periorbital edema            Ears-             Nose- Clear, no-Septal dev, mucus, polyps, erosion, perforation             Throat- Mallampati II , mucosa clear , drainage- none, tonsils- atrophic Neck- flexible , trachea midline, no stridor , thyroid nl,  carotid no bruit Chest - symmetrical excursion , unlabored           Heart/CV- RRR , no murmur , no gallop  , no rub, nl s1 s2                           - JVD- none , edema- none, stasis changes- none, varices- none           Lung- clear to P&A, wheeze- none, cough- none , dullness-none, rub- none           Chest wall-  Abd-  Br/ Gen/ Rectal- Not done, not indicated Extrem- cyanosis- none, clubbing, none, atrophy- none, strength- nl Neuro- grossly intact to observation

## 2022-11-05 ENCOUNTER — Ambulatory Visit (INDEPENDENT_AMBULATORY_CARE_PROVIDER_SITE_OTHER): Payer: BC Managed Care – PPO | Admitting: Internal Medicine

## 2022-11-05 ENCOUNTER — Encounter: Payer: Self-pay | Admitting: Internal Medicine

## 2022-11-05 VITALS — BP 132/76 | HR 98 | Ht 60.0 in | Wt 121.4 lb

## 2022-11-05 DIAGNOSIS — G4733 Obstructive sleep apnea (adult) (pediatric): Secondary | ICD-10-CM

## 2022-11-05 DIAGNOSIS — J3089 Other allergic rhinitis: Secondary | ICD-10-CM | POA: Diagnosis not present

## 2022-11-05 DIAGNOSIS — J302 Other seasonal allergic rhinitis: Secondary | ICD-10-CM | POA: Diagnosis not present

## 2022-11-05 DIAGNOSIS — R413 Other amnesia: Secondary | ICD-10-CM | POA: Diagnosis not present

## 2022-11-05 NOTE — Patient Instructions (Signed)
Order- schedule home sleep test    dx headache, OSA  Please call us 2 weeks after your sleep test for results and recommendations

## 2022-11-29 ENCOUNTER — Encounter: Payer: Self-pay | Admitting: Internal Medicine

## 2022-11-29 DIAGNOSIS — R413 Other amnesia: Secondary | ICD-10-CM | POA: Insufficient documentation

## 2022-11-29 NOTE — Assessment & Plan Note (Signed)
Known history of seasonally variable rhinitis with nasal congestion.  Not sure if this has been contributing to concerns about possible sleep disordered breathing.  Discussed OTC management.

## 2022-11-29 NOTE — Assessment & Plan Note (Signed)
Appropriate discussion done.  Not sure if there is objective evidence of memory decline.  Question is whether sleep disturbance may contribute. Plan-schedule sleep study.

## 2022-12-02 ENCOUNTER — Other Ambulatory Visit: Payer: Self-pay | Admitting: Internal Medicine

## 2022-12-08 ENCOUNTER — Encounter: Payer: Self-pay | Admitting: Internal Medicine

## 2022-12-11 ENCOUNTER — Encounter (INDEPENDENT_AMBULATORY_CARE_PROVIDER_SITE_OTHER): Payer: BC Managed Care – PPO | Admitting: Internal Medicine

## 2022-12-11 DIAGNOSIS — Z794 Long term (current) use of insulin: Secondary | ICD-10-CM

## 2022-12-11 DIAGNOSIS — E1165 Type 2 diabetes mellitus with hyperglycemia: Secondary | ICD-10-CM | POA: Diagnosis not present

## 2022-12-11 MED ORDER — LANTUS SOLOSTAR 100 UNIT/ML ~~LOC~~ SOPN: 6.0000 [IU] | PEN_INJECTOR | Freq: Every day | SUBCUTANEOUS | 3 refills | Status: DC

## 2022-12-11 MED ORDER — GLIPIZIDE 5 MG PO TABS: ORAL_TABLET | ORAL | 1 refills | Status: DC

## 2022-12-11 NOTE — Telephone Encounter (Signed)
Please see the MyChart message reply(ies) for my assessment and plan.    This patient gave consent for this Medical Advice Message and is aware that it may result in a bill to [employer], as well as the possibility of receiving a bill for a co-payment or deductible. They are an established patient, but are not seeking medical advice exclusively about a problem treated during an in person or video visit in the last seven days. I did not recommend an in person or video visit within seven days of my reply.      CONTINUOUS GLUCOSE MONITORING RECORD INTERPRETATION    Dates of Recording: 3/11 - 12/04/2022  Sensor description: [employer]  Results statistics:   CGM use % of time 93  Average and SD 191/31.1  Time in range      38%  % Time Above 180 42  % Time above 250 18  % Time Below target 2   Glycemic patterns summary: BG's trend down overnight, and increase during the day  Hyperglycemic episodes during the day mainly postprandial  Hypoglycemic episodes occurred at night  Overnight periods: Trends down    I spent a total of 6 minutes cumulative time within 7 days through [employer].  Dorita Sciara, MD

## 2022-12-12 ENCOUNTER — Encounter: Payer: Self-pay | Admitting: Internal Medicine

## 2022-12-18 ENCOUNTER — Other Ambulatory Visit: Payer: Self-pay | Admitting: Internal Medicine

## 2022-12-23 ENCOUNTER — Ambulatory Visit: Payer: BC Managed Care – PPO | Admitting: Dietician

## 2023-01-13 ENCOUNTER — Ambulatory Visit: Payer: BC Managed Care – PPO | Admitting: Dietician

## 2023-01-17 NOTE — Progress Notes (Unsigned)
NEUROLOGY FOLLOW UP OFFICE NOTE  Ruth Crawford [id number]  Assessment/Plan:   Medication-overuse headache, improved Possible sleep apnea     1  Home sleep study starts tonight 2  Limit use of pain relievers to no more than 2 days out of week to prevent risk of rebound or medication-overuse headache. 3  Keep headache diary 4  Follow up 6 months.   Subjective:  Ruth Crawford is a 67 year old right-handed female with DM II, HTN, HLD, RLS, Sickle cell trait, migraine, depression and anxiety who follows up for chronic headaches.  UPDATE: Referred to sleep medicine.  They considered her seasonal allergies may be affecting her sleep.  Home sleep study was ordered.  She is going to start it tonight.  Excedrin was tapered down to no more than 2 days out of week.  Over time, they decreased in severity and frequency.  Just a handful in April (1 to 2 a week).  Only took Excedrin twice.     HISTORY: Around April 2023, she began losing her sense of taste.  Her sense of smell was intact.  She wears a bite guard at night and has been biting into her tongue.  Also has dry mouth.  She wasn't sure if that was the cause.  She saw ENT and her dentist who did not find a specific cause.  No new medications.  She has history of iron deficiency anemia, which is controlled.  She takes B12 supplementation.  She has diabetes, which has been uncontrolled.  Most recent Hgb A1c from 03/15/2022 was 8.9.  She takes glipizide and metformin.  She takes duloxetine for depression/anxiety.  She takes cyclobenzaprine for her RLS.  TSH from 09/25/2021 was 1.05.  She last had COVID in September 2022 but not recently.  No visual disturbance, dizziness or lateralizing symptoms.  In October, she started tasting again. It had then gradually improved.     She has history of daily headaches for several years.  She wakes up every morning with headache.  It is a pressure across her head and around her eyes.  He notes nasal congestion.   If she takes Excedrin and decongestant, it will resolve quickly, otherwise it may progress to a throbbing pain.  No neck pain.  No nausea, vomiting, photophobia, phonophobia, or visual disturbance.  She takes Excedrin everyday.  On occasion, she will not wake up with a headache but will get one later in the day. She reports excessive daytime sleepiness.     PAST MEDICAL HISTORY: Past Medical History:  Diagnosis Date   Allergy    ANXIETY    COMMON MIGRAINE    DEPRESSION    DIABETES MELLITUS, TYPE II    GERD (gastroesophageal reflux disease) 10/17/2012   HYPERLIPIDEMIA    HYPERTENSION    IDA (iron deficiency anemia)    POLYP, GALLBLADDER    RLS (restless legs syndrome)    Sickle cell trait (HCC)     MEDICATIONS: Current Outpatient Medications on File Prior to Visit  Medication Sig Dispense Refill   Aspirin-Acetaminophen-Caffeine (EXCEDRIN MIGRAINE PO) Take by mouth as needed.     azelastine (OPTIVAR) 0.05 % ophthalmic solution Place 1 drop into both eyes 2 (two) times daily. 6 mL 12   co-enzyme Q-10 30 MG capsule Take 30 mg by mouth 3 (three) times daily.     Continuous Blood Gluc Receiver (FREESTYLE LIBRE READER) DEVI Apply 1 Device topically every 14 (fourteen) days. E11.9 6 each 3   Continuous  Blood Gluc Sensor (FREESTYLE LIBRE 2 SENSOR) MISC USE AS DIRECTED 6 each 3   Diclofenac Sodium 2 % SOLN Place 2 g onto the skin 2 (two) times daily. 112 g 3   diphenhydrAMINE (BENADRYL) 25 MG tablet Take 25 mg by mouth daily as needed.     DULoxetine (CYMBALTA) 30 MG capsule Take 1 capsule (30 mg total) by mouth daily. 90 capsule 3   glipiZIDE (GLUCOTROL) 5 MG tablet Take 2 tablets (10 mg total) by mouth daily before breakfast AND 1 tablet (5 mg total) daily before supper. 270 tablet 1   glucose blood (ONETOUCH VERIO) test strip Use as instructed daily E11.9 100 each 12   hydrOXYzine (ATARAX/VISTARIL) 10 MG tablet Take 1 tablet (10 mg total) by mouth 3 (three) times daily as needed. 30 tablet 0    insulin glargine (LANTUS SOLOSTAR) 100 UNIT/ML Solostar Pen Inject 6 Units into the skin daily. 15 mL 3   insulin glargine-yfgn (SEMGLEE) 100 UNIT/ML Pen SMARTSIG:9 Unit(s) SUB-Q Daily     Insulin Pen Needle (RELION PEN NEEDLES) 32G X 4 MM MISC USE 1  ONCE DAILY 50 each 5   Lancets MISC Use as directed daily E11.9 100 each 0   lisinopril (ZESTRIL) 20 MG tablet Take 1 tablet (20 mg total) by mouth daily. 90 tablet 3   lovastatin (MEVACOR) 40 MG tablet Take 1 tablet (40 mg total) by mouth daily. 90 tablet 3   magnesium oxide (MAG-OX) 400 MG tablet Take 400 mg by mouth daily.     metFORMIN (GLUCOPHAGE) 1000 MG tablet Take 1 tablet (1,000 mg total) by mouth 2 (two) times daily with a meal. 180 tablet 3   omeprazole (PRILOSEC) 40 MG capsule TAKE 1 CAPSULE BY MOUTH ONCE DAILY . APPOINTMENT REQUIRED FOR FUTURE REFILLS 90 capsule 3   ondansetron (ZOFRAN) 4 MG tablet Take 1 tablet (4 mg total) by mouth every 8 (eight) hours as needed for nausea or vomiting. 30 tablet 0   rOPINIRole (REQUIP) 1 MG tablet Take 1 tablet (1 mg total) by mouth at bedtime. 90 tablet 3   triamcinolone (NASACORT) 55 MCG/ACT AERO nasal inhaler Place 2 sprays into the nose daily. 1 each 12   vitamin B-12 (CYANOCOBALAMIN) 1000 MCG tablet Take 1 tablet (1,000 mcg total) by mouth daily. 90 tablet 3   No current facility-administered medications on file prior to visit.    ALLERGIES: Allergies  Allergen Reactions   Actos [Pioglitazone]     dizziness   Demerol [Meperidine] Other (See Comments)    Per pt: unknown   Trulicity [Dulaglutide] Dermatitis   Prednisone Itching and Rash    FAMILY HISTORY: Family History  Problem Relation Age of Onset   Diabetes Mother    Liver cancer Mother    Arthritis Mother    Alcohol abuse Mother    Heart disease Father    Heart attack Father    Diabetes Brother    Colon cancer Neg Hx    Esophageal cancer Neg Hx    Rectal cancer Neg Hx    Stomach cancer Neg Hx       Objective:   Blood pressure 100/60, pulse 98, height 5' (1.524 m), weight 121 lb 6.4 oz (55.1 kg), SpO2 100 %. General: No acute distress.  Patient appears well-groomed.     Shon Millet, DO  CC: Oliver Barre, MD

## 2023-01-20 ENCOUNTER — Ambulatory Visit (INDEPENDENT_AMBULATORY_CARE_PROVIDER_SITE_OTHER): Payer: BC Managed Care – PPO | Admitting: Neurology

## 2023-01-20 ENCOUNTER — Encounter: Payer: Self-pay | Admitting: Neurology

## 2023-01-20 VITALS — BP 100/60 | HR 98 | Ht 60.0 in | Wt 121.4 lb

## 2023-01-20 DIAGNOSIS — G4440 Drug-induced headache, not elsewhere classified, not intractable: Secondary | ICD-10-CM

## 2023-01-20 NOTE — Patient Instructions (Signed)
Limit use of pain relievers to no more than 2 days out of week to prevent risk of rebound or medication-overuse headache. Keep headache diary Sleep study Follow up 6 months.

## 2023-02-03 ENCOUNTER — Ambulatory Visit: Payer: BC Managed Care – PPO | Admitting: Internal Medicine

## 2023-02-05 ENCOUNTER — Encounter (INDEPENDENT_AMBULATORY_CARE_PROVIDER_SITE_OTHER): Payer: BC Managed Care – PPO

## 2023-02-05 DIAGNOSIS — G4733 Obstructive sleep apnea (adult) (pediatric): Secondary | ICD-10-CM

## 2023-02-24 ENCOUNTER — Ambulatory Visit: Payer: BC Managed Care – PPO | Admitting: Internal Medicine

## 2023-02-24 NOTE — Progress Notes (Deleted)
Name: Ruth Crawford  Age/ Sex: 67 y.o., female   MRN/ DOB: [id number], May 12, 1956     PCP: Corwin Levins, MD   Reason for Endocrinology Evaluation: Type 2 Diabetes Mellitus  Initial Endocrine Consultative Visit: 02/13/2021    PATIENT IDENTIFIER: Ruth Crawford is a 67 y.o. female with a past medical history of T2Dm, sickle cell trait, RLS, and Dyslipidemia . The patient has followed with Endocrinology clinic since 02/13/2021 for consultative assistance with management of her diabetes.    DIABETIC HISTORY:  Ruth Crawford was diagnosed with DM in 2008. Her hemoglobin A1c has ranged from  6.4% in 2015, peaking at 10.9% in 2022.    On her initial visit to our clinic her A1c was  10.9% , she was on Invokana, Metformin and Glipizide. We switched Glipizide to basal insulin , Invokana was cost prohibitive. Continued Metformin  Intolerant to Pioglitazone   GAD-65 and Iselt cell antibodies negative    Trulicity started 05/2021 but developed local reaction, started ozempic 09/2021 but had sto stop it 10/2021 due to cost    Started Glipizide 03/2022 SUBJECTIVE:   During the last visit (08/20/2022): A1c 7.7%    Today (02/24/2023): Ruth Crawford is here for a follow up on diabetes management.  She checks her blood sugars multiple  times daily, through CGM . The patient has had hypoglycemic episodes since the last clinic visit., overnight.     She has recent diarrhea due to a virus, husband is sick as well     HOME DIABETES REGIMEN:  Glipizide 5 mg BID Metformin 1000 mg twice a day Semglee 9 units once  daily      Statin: yes ACE-I/ARB: yes    CONTINUOUS GLUCOSE MONITORING RECORD INTERPRETATION    Dates of Recording: 11/22-12/01/2022  Sensor description:frestyle libre   Results statistics:   CGM use % of time 89  Average and SD 173/36.3  Time in range 55 %  % Time Above 180 33  % Time above 250 11  % Time Below target 1    Glycemic patterns summary: BG's optimal overnight but  high during the day   Hyperglycemic episodes  post prandial   Hypoglycemic episodes occurred variable   Overnight periods: trends down        DIABETIC COMPLICATIONS: Microvascular complications:   Denies: CKD, retinopathy, neuropathy Last Eye Exam: Completed 2022  Macrovascular complications:   Denies: CAD, CVA, PVD   HISTORY:  Past Medical History:  Past Medical History:  Diagnosis Date   Allergy    ANXIETY    COMMON MIGRAINE    DEPRESSION    DIABETES MELLITUS, TYPE II    GERD (gastroesophageal reflux disease) 10/17/2012   HYPERLIPIDEMIA    HYPERTENSION    IDA (iron deficiency anemia)    POLYP, GALLBLADDER    RLS (restless legs syndrome)    Sickle cell trait (HCC)    Past Surgical History:  Past Surgical History:  Procedure Laterality Date   ABDOMINAL HYSTERECTOMY  2000   COLONOSCOPY     LUMBAR DISC SURGERY  1998, 2016, 2017   s/p   Social History:  reports that she has never smoked. She has never used smokeless tobacco. She reports current alcohol use. She reports that she does not use drugs. Family History:  Family History  Problem Relation Age of Onset   Diabetes Mother    Liver cancer Mother    Arthritis Mother    Alcohol abuse Mother    Heart disease Father  Heart attack Father    Diabetes Brother    Colon cancer Neg Hx    Esophageal cancer Neg Hx    Rectal cancer Neg Hx    Stomach cancer Neg Hx      HOME MEDICATIONS: Allergies as of 02/24/2023       Reactions   Actos [pioglitazone]    dizziness   Demerol [meperidine] Other (See Comments)   Per pt: unknown   Trulicity [dulaglutide] Dermatitis   Prednisone Itching, Rash        Medication List        Accurate as of February 24, 2023 12:41 PM. If you have any questions, ask your nurse or doctor.          aspirin 81 MG chewable tablet Chew 81 mg by mouth daily.   azelastine 0.05 % ophthalmic solution Commonly known as: OPTIVAR Place 1 drop into both eyes 2 (two) times  daily.   co-enzyme Q-10 30 MG capsule Take 30 mg by mouth 3 (three) times daily.   cyanocobalamin 1000 MCG tablet Commonly known as: VITAMIN B12 Take 1 tablet (1,000 mcg total) by mouth daily.   cyclobenzaprine 10 MG tablet Commonly known as: FLEXERIL Take 10 mg by mouth at bedtime.   diclofenac Sodium 2 % Soln Commonly known as: PENNSAID Place 2 g onto the skin 2 (two) times daily.   diphenhydrAMINE 25 MG tablet Commonly known as: BENADRYL Take 25 mg by mouth daily as needed.   DULoxetine 30 MG capsule Commonly known as: CYMBALTA Take 1 capsule (30 mg total) by mouth daily.   EXCEDRIN MIGRAINE PO Take by mouth as needed.   FreeStyle Libre 2 Sensor Misc USE AS DIRECTED   FreeStyle Libre Reader Devi Apply 1 Device topically every 14 (fourteen) days. E11.9   glipiZIDE 5 MG tablet Commonly known as: GLUCOTROL Take 2 tablets (10 mg total) by mouth daily before breakfast AND 1 tablet (5 mg total) daily before supper.   hydrOXYzine 10 MG tablet Commonly known as: ATARAX Take 1 tablet (10 mg total) by mouth 3 (three) times daily as needed.   insulin glargine-yfgn 100 UNIT/ML Pen Commonly known as: SEMGLEE SMARTSIG:9 Unit(s) SUB-Q Daily   Lancets Misc Use as directed daily E11.9   Lantus SoloStar 100 UNIT/ML Solostar Pen Generic drug: insulin glargine Inject 6 Units into the skin daily.   lisinopril 20 MG tablet Commonly known as: ZESTRIL Take 1 tablet (20 mg total) by mouth daily.   lovastatin 40 MG tablet Commonly known as: MEVACOR Take 1 tablet (40 mg total) by mouth daily.   magnesium oxide 400 MG tablet Commonly known as: MAG-OX Take 400 mg by mouth daily.   metFORMIN 1000 MG tablet Commonly known as: GLUCOPHAGE Take 1 tablet (1,000 mg total) by mouth 2 (two) times daily with a meal.   omeprazole 40 MG capsule Commonly known as: PRILOSEC TAKE 1 CAPSULE BY MOUTH ONCE DAILY . APPOINTMENT REQUIRED FOR FUTURE REFILLS   ondansetron 4 MG  tablet Commonly known as: Zofran Take 1 tablet (4 mg total) by mouth every 8 (eight) hours as needed for nausea or vomiting.   OneTouch Verio test strip Generic drug: glucose blood Use as instructed daily E11.9   ReliOn Pen Needles 32G X 4 MM Misc Generic drug: Insulin Pen Needle USE 1  ONCE DAILY   rOPINIRole 1 MG tablet Commonly known as: REQUIP Take 1 tablet (1 mg total) by mouth at bedtime.   triamcinolone 55 MCG/ACT Aero nasal inhaler Commonly known  as: NASACORT Place 2 sprays into the nose daily.   Trulicity 0.75 MG/0.5ML Sopn Generic drug: Dulaglutide Inject 0.75 mg as directed daily at 2 PM.         OBJECTIVE:   Vital Signs: There were no vitals taken for this visit.  Wt Readings from Last 3 Encounters:  01/20/23 121 lb 6.4 oz (55.1 kg)  11/05/22 121 lb 6.4 oz (55.1 kg)  10/29/22 123 lb (55.8 kg)     Exam: General: Pt appears well and is in NAD  Lungs: Clear with good BS bilat with no rales, rhonchi, or wheezes  Heart: RRR   Abdomen: Normoactive bowel sounds, soft, nontender, without masses or organomegaly palpable  Extremities: No pretibial edema. No tremor. Normal strength and motion throughout. See detailed diabetic foot exam below.  Neuro: MS is good with appropriate affect, pt is alert and Ox3     DM foot exam: 02/13/2021     The skin of the feet is intact without sores or ulcerations. The pedal pulses are 2+ on right and 2+ on left. The sensation is intact to a screening 5.07, 10 gram monofilament bilaterally       DATA REVIEWED:  Lab Results  Component Value Date   HGBA1C 8.0 (H) 10/07/2022   HGBA1C 7.7 (A) 08/20/2022   HGBA1C 8.9 (H) 03/15/2022    Latest Reference Range & Units 10/07/22 13:50  Sodium 135 - 145 mEq/L 140  Potassium 3.5 - 5.1 mEq/L 3.7  Chloride 96 - 112 mEq/L 103  CO2 19 - 32 mEq/L 29  Glucose 70 - 99 mg/dL 161 (H)  BUN 6 - 23 mg/dL 16  Creatinine 0.96 - 0.45 mg/dL 4.09  Calcium 8.4 - 81.1 mg/dL 9.3  Alkaline  Phosphatase 39 - 117 U/L 58  Albumin 3.5 - 5.2 g/dL 4.4  AST 0 - 37 U/L 14  ALT 0 - 35 U/L 9  Total Protein 6.0 - 8.3 g/dL 7.6  Bilirubin, Direct 0.0 - 0.3 mg/dL 0.1  Total Bilirubin 0.2 - 1.2 mg/dL 0.4  GFR >91.47 mL/min 73.36     Latest Reference Range & Units 10/07/22 13:50  Total CHOL/HDL Ratio  2  Cholesterol 0 - 200 mg/dL 829  HDL Cholesterol >56.21 mg/dL 30.86  LDL (calc) 0 - 99 mg/dL 47  MICROALB/CREAT RATIO 0.0 - 30.0 mg/g 2.4  NonHDL  73.56  Triglycerides 0.0 - 149.0 mg/dL 578.4  VLDL 0.0 - 69.6 mg/dL 29.5     Results for KATRINNA, HANLAN (MRN [id number]) as of 06/05/2021 07:31  Ref. Range 02/13/2021 10:45  ISLET CELL ANTIBODY SCREEN Latest Ref Range: NEGATIVE  NEGATIVE   Glutamic Acid Decarb Ab <5 IU/mL <5     ASSESSMENT / PLAN / RECOMMENDATIONS:   1) Type 2 Diabetes Mellitus, Sub-Optimally  controlled, Without complications - Most recent A1c of 7.7 %. Goal A1c < 7.0 %.    - A1c down from  8.9% to 7.7% but remains above goal  - Intolerant to Pioglitazone  -Unfortunately she has local allergic reaction to Trulicity injections -Ozempic and SGLT2 inhibitors have been cost prohibitive - In reviewing her CGM down  she has been noted with hypoglycemia at night and between meals and postprandial hyperglycemia - Will increase Glipizide as below and reduce insulin as below    MEDICATIONS:  Increase glipizide 5 mg, 1 tablet BID Decrease Semglee 9 units daily Continue Metformin 1000 mg twice    EDUCATION / INSTRUCTIONS: BG monitoring instructions: Patient is instructed to check her blood  sugars 3 times a day, before meals . Call [facility] [facility] if: BG persistently < 70  I reviewed the Rule of 15 for the treatment of hypoglycemia in detail with the patient. Literature supplied.  2) Diabetic complications:  Eye: Does not have known diabetic retinopathy.  Neuro/ Feet: Does not have known diabetic peripheral neuropathy .  Renal: Patient does not have known  baseline CKD. She   is  on an ACEI/ARB at present.     F/U in 4 months     Signed electronically by: Lyndle Herrlich, MD  [facility]  [facility] [address] [place] [place], [place] [address] Phone: [phone] FAX: [phone]   CC: Corwin Levins, MD [address] [place] [address] Phone: [phone]  Fax: [phone]  Return to Endocrinology clinic as below: Future Appointments  Date Time Provider Department Center  02/24/2023  2:00 PM Sheralee Qazi, Konrad Dolores, MD [facility] None  02/27/2023 11:15 AM Bonnita Levan, RD [facility] NDM  03/03/2023 11:30 AM Waymon Budge, MD [facility] None  04/07/2023  1:40 PM Corwin Levins, MD [facility] None  07/21/2023 10:10 AM Drema Dallas, DO [facility] None

## 2023-02-27 ENCOUNTER — Encounter: Payer: Self-pay | Admitting: Dietician

## 2023-02-27 ENCOUNTER — Encounter: Payer: BC Managed Care – PPO | Attending: Internal Medicine | Admitting: Dietician

## 2023-02-27 VITALS — Wt 119.0 lb

## 2023-02-27 DIAGNOSIS — Z794 Long term (current) use of insulin: Secondary | ICD-10-CM | POA: Insufficient documentation

## 2023-02-27 DIAGNOSIS — E119 Type 2 diabetes mellitus without complications: Secondary | ICD-10-CM | POA: Insufficient documentation

## 2023-02-27 NOTE — Progress Notes (Signed)
Diabetes Self-Management Education  Visit Type: Follow-up  Appt. Start Time: 1115 Appt. End Time: 1142  02/27/2023  [name], identified by name and date of birth, is a 67 y.o. female with a diagnosis of Diabetes:  .   ASSESSMENT 11:15-  Patient is here today with her granddaughter.  She was last seen by this RD on 10/28/2022 Patient reports poor appetite.  Currently only craving Subs. States that last night at about 3 am she states that she was very hungry and ate some cheez its but then vomited.  She states that this happens at times when she becomes over hungry. Stationary bike  15 minutes 1-2 times per day. Sleep study but does not know the results.  History includes Type 2 Diabetes (2008), anemia, depression/anxiety, GERD, HLD, HTN Medications/supplements include:  metformin, glipizide bid, Semglee 6 units q HS (per patient), Co Q-10, magnesium, vitmain D, Vitamin B-12, iron, super beets Labs noted to include:  A1C 8% 10/07/2022 increased from 7.7% 08/20/2022, 10.5% 09/28/2020, GFR has decreased from 90 09/2021 to 73 10/07/2022 Vitamin B-12 239 on 09/25/2021 Sleep:  8-9 hours per night plus nap during the day - poor quality due to restless leg syndrome Exercise:  Limited due to shoulders.  Likes stationary bike, caring for 3 yo grandson.  Currently states that she is not motivated for exercise. Stress:  moderate  FreeStyle Libre (uses app on her phone).   CGM Results from download:  10/28/22 02/27/2023  % Time CGM active:   84 %   (Goal >70%) 79  Average glucose:   174 mg/dL for 14 days 161 X 14 days  Time in range (70-180 mg/dL):   55 %   (Goal >09%) 43  Time High (181-250 mg/dL):   27 %   (Goal < 60%) 33  Time Very High (>250 mg/dL):    17 %   (Goal < 5%) 24  Time Low (<69 mg/dL):   1 %   (Goal <4%) 0    Weight hx: 60" 119 lbs 02/27/2023 123 lbs  06/21/2022 and 10/27/2022 116 lbs 03/29/2022   Patient lives with her husband.  He does not like any vegetables except for  green beans.  This has decreased her vegetable intake. Husband doesn't like vegetables. She does the shopping and cooking. 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  She helps care for her step daughter's son (77 yo). The step daughter is addicted to drugs.  Also cares for grandchildren at times. She reports increased amounts of stress.  10 am 1/2 small fry and regular Coke 3 pm [place] sub (3") 8 pm [place] sub (3") 3 am cheez its(handful) but vomited Water, Regular Dr. [name]  Weight 119 lb (54 kg). Body mass index is 23.24 kg/m.   Diabetes Self-Management Education - 02/27/23 1205       Visit Information   Visit Type Follow-up      Psychosocial Assessment   What is the hardest part about your diabetes right now, causing you the most concern, or is the most worrisome to you about your diabetes?   Making healty food and beverage choices;Being active    Self-care barriers None    Self-management support Doctor's office;CDE visits    Other persons present Patient    Patient Concerns Nutrition/Meal planning;Healthy Lifestyle;Glycemic Control    Special Needs None    Preferred Learning Style No preference indicated    Learning Readiness Ready    How often  do you need to have someone help you when you read instructions, pamphlets, or other written materials from your doctor or pharmacy? 1 - Never      Pre-Education Assessment   Patient understands the diabetes disease and treatment process. Comprehends key points    Patient understands incorporating nutritional management into lifestyle. Needs Review    Patient undertands incorporating physical activity into lifestyle. Comprehends key points    Patient understands using medications safely. Comprehends key points    Patient understands monitoring blood glucose, interpreting and using results Comprehends key points    Patient understands prevention, detection, and treatment of acute complications.  Comprehends key points    Patient understands prevention, detection, and treatment of chronic complications. Compreheands key points    Patient understands how to develop strategies to address psychosocial issues. Comprehends key points    Patient understands how to develop strategies to promote health/change behavior. Needs Review      Complications   Last HgB A1C per patient/outside source 8 %   10/07/22   How often do you check your blood sugar? > 4 times/day    Fasting Blood glucose range (mg/dL) 16-109;604-540    Postprandial Blood glucose range (mg/dL) 981-191;478-295;>621    Number of hypoglycemic episodes per month 2    Number of hyperglycemic episodes ( >200mg /dL): Weekly      Dietary Intake   Breakfast 1/2 small fry and regular coke   10 am   Lunch 3" [place] sub   3 pm   Dinner 3" [place] sub   8 pm   Beverage(s) water, regular soda      Activity / Exercise   Activity / Exercise Type Light (walking / raking leaves)    How many days per week do you exercise? 2    How many minutes per day do you exercise? 15    Total minutes per week of exercise 30      Patient Education   Previous Diabetes Education Yes (please comment)   frequent   Healthy Eating Meal options for control of blood glucose level and chronic complications.;Plate Method    Being Active Role of exercise on diabetes management, blood pressure control and cardiac health.    Medications Reviewed patients medication for diabetes, action, purpose, timing of dose and side effects.    Monitoring Taught/evaluated CGM (comment)    Diabetes Stress and Support Identified and addressed patients feelings and concerns about diabetes;Worked with patient to identify barriers to care and solutions      Individualized Goals (developed by patient)   Nutrition General guidelines for healthy choices and portions discussed    Physical Activity Exercise 3-5 times per week;15 minutes per day    Medications take my medication as  prescribed    Monitoring  Consistenly use CGM    Problem Solving Eating Pattern;Addressing barriers to behavior change    Reducing Risk examine blood glucose patterns;do foot checks daily;treat hypoglycemia with 15 grams of carbs if blood glucose less than 70mg /dL      Patient Self-Evaluation of Goals - Patient rates self as meeting previously set goals (% of time)   Nutrition 25 - 50% (sometimes)    Physical Activity < 25% (hardly ever/never)    Medications >75% (most of the time)    Monitoring >75% (most of the time)    Problem Solving and behavior change strategies  25 - 50% (sometimes)    Reducing Risk (treating acute and chronic complications) 25 - 50% (sometimes)  Health Coping 50 - 75 % (half of the time)      Post-Education Assessment   Patient understands the diabetes disease and treatment process. Demonstrates understanding / competency    Patient understands incorporating nutritional management into lifestyle. Needs Review    Patient undertands incorporating physical activity into lifestyle. Comprehends key points    Patient understands using medications safely. Comphrehends key points    Patient understands monitoring blood glucose, interpreting and using results Comprehends key points    Patient understands prevention, detection, and treatment of acute complications. Comprehends key points    Patient understands prevention, detection, and treatment of chronic complications. Comprehends key points    Patient understands how to develop strategies to address psychosocial issues. Comprehends key points    Patient understands how to develop strategies to promote health/change behavior. Needs Review      Outcomes   Expected Outcomes Demonstrated interest in learning. Expect positive outcomes    Future DMSE 3-4 months    Program Status Not Completed      Subsequent Visit   Since your last visit have you experienced any weight changes? Loss    Weight Loss (lbs) 5    Since your  last visit, are you checking your blood glucose at least once a day? Yes             Individualized Plan for Diabetes Self-Management Training:   Learning Objective:  Patient will have a greater understanding of diabetes self-management. Patient education plan is to attend individual and/or group sessions per assessed needs and concerns.   Plan:   Patient Instructions   Eat more vegetables and fruit Decrease sweet intake Drink mostly water and other unsweetened beverages - Rethink what you drink Small portion of protein with each meal   Exercise as you are able.- start a consistent habit.         Expected Outcomes:  Demonstrated interest in learning. Expect positive outcomes  Education material provided:   If problems or questions, patient to contact team via:  Phone  Future DSME appointment: 3-4 months

## 2023-02-27 NOTE — Patient Instructions (Addendum)
Eat more vegetables and fruit Decrease sweet intake Drink mostly water and other unsweetened beverages - Rethink what you drink Small portion of protein with each meal   Exercise as you are able.- start a consistent habit.

## 2023-03-01 NOTE — Progress Notes (Unsigned)
HPI F never smoker. Previously followed for allergic rhiniits/ conjunctivitis and food allergy.As o  Lab 08/20/2013-  ANA negative, sedimentation rate only 23, Allergy Profile 12//14- total IgE 353.1, broadly positive. Formerly allergy vaccine.  ============================================================  11/05/22- 66 yoF for sleep evaluation courtesy of Dr Everlena Cooper, last seen in 2017 for allergy and coming now to re-establish for sleep with concerns of daytime sleepiness, nocturnal hypoxemia. Medical problem list includes HTN, Allergic Rhinitis, GERD, DM2, Cervicl Radiculopathy, Hyperlipidemia, Restless Legs, Anxiety/Depression,  Epworth score- Body weight today-121 lbs ------Pt states she is having memory issues. No issues with sleep. No previous sleep study She is concerned that sleep deprivation and possible sleep apnea might be contributing to memory changes.  Appropriate discussion done.  We will schedule sleep study.  03/03/23- 34 yoF never smoker followed for OSA with concern of memory issues, daytime sleepiness, nocturnal hypoxemia. Complicated by HTN, Allergic Rhinitis, GERD, DM2, Cervicl Radiculopathy, Hyperlipidemia, Restless Legs, Anxiety/Depreswsion,  HST 01/30/23- AHI 9.5/ hr, desaturation to 83%/ </= 88% for 15 minutes, body weight 116 lbs For treatment discussion   ROS-see HPI Constitutional:   No-   weight loss, night sweats, fevers, chills, fatigue, lassitude. HEENT:   +  headaches, no-difficulty swallowing, tooth/dental problems, sore throat,       No-  sneezing, +itching, no-ear ache, +nasal congestion, +post nasal drip,  CV:  No-   chest pain, orthopnea, PND, swelling in lower extremities, anasarca, dizziness, palpitations Resp: No-   shortness of breath with exertion or at rest.              No-   productive cough,  No non-productive cough,  No- coughing up of blood.              No-   change in color of mucus.  No- wheezing.   Skin: +occasional rash . GI:  No-    heartburn, indigestion, abdominal pain, nausea, vomiting,  GU:  MS:  No-   joint pain or swelling.  No- decreased range of motion.  Neuro-     nothing unusual Psych:  No- change in mood or affect. No depression or anxiety.  No memory loss.  OBJ- Physical Exam General- Alert, Oriented, Affect-appropriate, Distress- none acute Skin- rash-none, lesions- none, excoriation- none Lymphadenopathy- none Head- atraumatic            Eyes- Gross vision intact, PERRLA, conjunctivae-not red/ injected                            + periorbital edema            Ears-             Nose- Clear, no-Septal dev, mucus, polyps, erosion, perforation             Throat- Mallampati II , mucosa clear , drainage- none, tonsils- atrophic Neck- flexible , trachea midline, no stridor , thyroid nl, carotid no bruit Chest - symmetrical excursion , unlabored           Heart/CV- RRR , no murmur , no gallop  , no rub, nl s1 s2                           - JVD- none , edema- none, stasis changes- none, varices- none           Lung- clear to P&A, wheeze- none, cough- none , dullness-none, rub- none  Chest wall-  Abd-  Br/ Gen/ Rectal- Not done, not indicated Extrem- cyanosis- none, clubbing, none, atrophy- none, strength- nl Neuro- grossly intact to observation

## 2023-03-03 ENCOUNTER — Encounter: Payer: Self-pay | Admitting: Internal Medicine

## 2023-03-03 ENCOUNTER — Encounter: Payer: Self-pay | Admitting: Neurology

## 2023-03-03 ENCOUNTER — Ambulatory Visit (INDEPENDENT_AMBULATORY_CARE_PROVIDER_SITE_OTHER): Payer: BC Managed Care – PPO | Admitting: Internal Medicine

## 2023-03-03 VITALS — BP 138/78 | HR 101 | Temp 98.4°F | Ht 60.0 in | Wt 121.8 lb

## 2023-03-03 DIAGNOSIS — R413 Other amnesia: Secondary | ICD-10-CM | POA: Diagnosis not present

## 2023-03-03 DIAGNOSIS — G4733 Obstructive sleep apnea (adult) (pediatric): Secondary | ICD-10-CM | POA: Diagnosis not present

## 2023-03-03 HISTORY — DX: Obstructive sleep apnea (adult) (pediatric): G47.33

## 2023-03-03 NOTE — Assessment & Plan Note (Signed)
Her concerns were with suspected memory loss and daytime tiredness.  She has chosen to try an oral appliance she has chosen to try an oral appliance to address mild sleep apnea.  I explained that we will know if there is a connection between OSA and her symptoms until we see if treatment helps it. Plan-referral to consider oral appliance

## 2023-03-03 NOTE — Assessment & Plan Note (Signed)
Subjective complaint.  If oral appliance for OSA does not seem to help she might still consider trying CPAP with recheck of overnight oximetry.  I suggested she ask her primary physician about neurology referral.

## 2023-03-03 NOTE — Patient Instructions (Signed)
Order- referral to Dr Althea Grimmer, otolaryngology   consider oral appliance for sleep apnea  We can refer you for CPAP later if you choose.

## 2023-03-06 ENCOUNTER — Encounter: Payer: Self-pay | Admitting: Neurology

## 2023-03-06 ENCOUNTER — Ambulatory Visit (INDEPENDENT_AMBULATORY_CARE_PROVIDER_SITE_OTHER): Payer: BC Managed Care – PPO | Admitting: Neurology

## 2023-03-06 VITALS — BP 146/72 | HR 92 | Ht 60.0 in | Wt 121.0 lb

## 2023-03-06 DIAGNOSIS — R413 Other amnesia: Secondary | ICD-10-CM

## 2023-03-06 NOTE — Progress Notes (Signed)
NEUROLOGY FOLLOW UP OFFICE NOTE  Ruth Crawford [id number]  Assessment/Plan:   Memory deficits - She does have secondary etiologies that certainly may be contributing, such as sleep apnea and psychosocial factors.  Cannot rule out possibly underlying neurodegenerative disease.  Based on her MoCA score, I think further evaluation is warranted.    Check MRI of brain  Refer for neuropsychological evaluation Recommend discussing with PCP about management for her underlying depression. Follow up in November as scheduled.    Subjective:  Ruth Crawford is a 67 year old right-handed female with DM II, HTN, HLD< RLS, Sickle cell trait, migraine, depression and anxiety whom I see for headaches presents today for memory problems.    She reports short term memory problems for at least 6 months.  Her husband notices it as well.  She has been misplacing objects that she just put down, such as the keys, her phone or her glasses.  She will go to the grocery store and forget to buy certain items.  She has been having difficulty recalling names of people at church.  On a couple of occasions, she was driving and suddenly became unsure how she got there.  She has started forgetting about appointments, even if she puts them on her calendar.  She has gone to the doctor thinking she was seeing one of her other providers.  At times, she has had trouble remembering how to get to her provider's office.  She denies family history of dementia.  She has been having excessive daytime sleepiness and was recently diagnosed mild OSA, but severe enough that would warrant treatment.  She plans to use an oral appliance.  Her chronic headaches have actually been much better controlled.  She takes B12 supplements.  B12 level from 10/07/2022 was 552.  Other labs revealed TSH 0.45, vit D 43.10, and CBC revealing anemia with unremarkable iron studies.  She does report increased stress and depression.  Her stepdaughter has a history of  drug abuse and had been unable to care for her son.  Patient and her husband had been caring for their grandchild for the past few years.  Their stepdaughter is trying to get him back.  This has taken a toll on her own marriage as well.    PAST MEDICAL HISTORY: Past Medical History:  Diagnosis Date   Allergy    ANXIETY    COMMON MIGRAINE    DEPRESSION    DIABETES MELLITUS, TYPE II    GERD (gastroesophageal reflux disease) 10/17/2012   HYPERLIPIDEMIA    HYPERTENSION    IDA (iron deficiency anemia)    POLYP, GALLBLADDER    RLS (restless legs syndrome)    Sickle cell trait (HCC)     MEDICATIONS: Current Outpatient Medications on File Prior to Visit  Medication Sig Dispense Refill   aspirin 81 MG chewable tablet Chew 81 mg by mouth daily.     Aspirin-Acetaminophen-Caffeine (EXCEDRIN MIGRAINE PO) Take by mouth as needed.     azelastine (OPTIVAR) 0.05 % ophthalmic solution Place 1 drop into both eyes 2 (two) times daily. 6 mL 12   co-enzyme Q-10 30 MG capsule Take 30 mg by mouth 3 (three) times daily.     Continuous Blood Gluc Receiver (FREESTYLE LIBRE READER) DEVI Apply 1 Device topically every 14 (fourteen) days. E11.9 6 each 3   Continuous Blood Gluc Sensor (FREESTYLE LIBRE 2 SENSOR) MISC USE AS DIRECTED 6 each 3   cyclobenzaprine (FLEXERIL) 10 MG tablet Take 10 mg  by mouth at bedtime.     Diclofenac Sodium 2 % SOLN Place 2 g onto the skin 2 (two) times daily. 112 g 3   diphenhydrAMINE (BENADRYL) 25 MG tablet Take 25 mg by mouth daily as needed.     Dulaglutide (TRULICITY) 0.75 MG/0.5ML SOPN Inject 0.75 mg as directed daily at 2 PM.     DULoxetine (CYMBALTA) 30 MG capsule Take 1 capsule (30 mg total) by mouth daily. 90 capsule 3   glipiZIDE (GLUCOTROL) 5 MG tablet Take 2 tablets (10 mg total) by mouth daily before breakfast AND 1 tablet (5 mg total) daily before supper. 270 tablet 1   glucose blood (ONETOUCH VERIO) test strip Use as instructed daily E11.9 100 each 12   hydrOXYzine  (ATARAX/VISTARIL) 10 MG tablet Take 1 tablet (10 mg total) by mouth 3 (three) times daily as needed. 30 tablet 0   insulin glargine (LANTUS SOLOSTAR) 100 UNIT/ML Solostar Pen Inject 6 Units into the skin daily. 15 mL 3   insulin glargine-yfgn (SEMGLEE) 100 UNIT/ML Pen SMARTSIG:9 Unit(s) SUB-Q Daily     Insulin Pen Needle (RELION PEN NEEDLES) 32G X 4 MM MISC USE 1  ONCE DAILY 50 each 5   Lancets MISC Use as directed daily E11.9 100 each 0   lisinopril (ZESTRIL) 20 MG tablet Take 1 tablet (20 mg total) by mouth daily. 90 tablet 3   lovastatin (MEVACOR) 40 MG tablet Take 1 tablet (40 mg total) by mouth daily. 90 tablet 3   magnesium oxide (MAG-OX) 400 MG tablet Take 400 mg by mouth daily.     metFORMIN (GLUCOPHAGE) 1000 MG tablet Take 1 tablet (1,000 mg total) by mouth 2 (two) times daily with a meal. 180 tablet 3   omeprazole (PRILOSEC) 40 MG capsule TAKE 1 CAPSULE BY MOUTH ONCE DAILY . APPOINTMENT REQUIRED FOR FUTURE REFILLS 90 capsule 3   ondansetron (ZOFRAN) 4 MG tablet Take 1 tablet (4 mg total) by mouth every 8 (eight) hours as needed for nausea or vomiting. 30 tablet 0   rOPINIRole (REQUIP) 1 MG tablet Take 1 tablet (1 mg total) by mouth at bedtime. 90 tablet 3   triamcinolone (NASACORT) 55 MCG/ACT AERO nasal inhaler Place 2 sprays into the nose daily. 1 each 12   vitamin B-12 (CYANOCOBALAMIN) 1000 MCG tablet Take 1 tablet (1,000 mcg total) by mouth daily. 90 tablet 3   No current facility-administered medications on file prior to visit.    ALLERGIES: Allergies  Allergen Reactions   Actos [Pioglitazone]     dizziness   Demerol [Meperidine] Other (See Comments)    Per pt: unknown   Trulicity [Dulaglutide] Dermatitis   Prednisone Itching and Rash    FAMILY HISTORY: Family History  Problem Relation Age of Onset   Diabetes Mother    Liver cancer Mother    Arthritis Mother    Alcohol abuse Mother    Heart disease Father    Heart attack Father    Diabetes Brother    Colon cancer  Neg Hx    Esophageal cancer Neg Hx    Rectal cancer Neg Hx    Stomach cancer Neg Hx       Objective:  Blood pressure (!) 146/72, pulse 92, height 5' (1.524 m), weight 121 lb (54.9 kg), SpO2 98 %. General: No acute distress.  Patient appears well-groomed.   Head:  Normocephalic/atraumatic Eyes:  Fundi examined but not visualized Neck: supple, no paraspinal tenderness, full range of motion Heart:  Regular rate and rhythm  Lungs:  Clear to auscultation bilaterally Back: No paraspinal tenderness Neurological Exam: alert and oriented.  Speech fluent and not dysarthric, language intact.      03/06/2023   11:00 AM  Montreal Cognitive Assessment   Visuospatial/ Executive (0/5) 4  Naming (0/3) 3  Attention: Read list of digits (0/2) 2  Attention: Read list of letters (0/1) 1  Attention: Serial 7 subtraction starting at 100 (0/3) 0  Language: Repeat phrase (0/2) 2  Language : Fluency (0/1) 1  Abstraction (0/2) 0  Delayed Recall (0/5) 1  Orientation (0/6) 5  Total 19  Adjusted Score (based on education) 19   CN II-XII intact. Bulk and tone normal, muscle strength 5/5 throughout.  Sensation to light touch intact.  Deep tendon reflexes 2+ throughout, toes downgoing.  Finger to nose testing intact.  Gait normal, Romberg negative.   Shon Millet, DO  CC: Oliver Barre, MD

## 2023-03-06 NOTE — Patient Instructions (Addendum)
Check MRI of brain Refer for neuropsychological evaluation Start treatment for sleep apnea Discuss with Dr. Jonny Ruiz about management for depression (including speaking to a therapist) Keep appointment in November

## 2023-03-10 ENCOUNTER — Ambulatory Visit: Payer: BC Managed Care – PPO

## 2023-03-10 ENCOUNTER — Ambulatory Visit (INDEPENDENT_AMBULATORY_CARE_PROVIDER_SITE_OTHER): Payer: BC Managed Care – PPO | Admitting: Psychology

## 2023-03-10 ENCOUNTER — Encounter: Payer: Self-pay | Admitting: Psychology

## 2023-03-10 DIAGNOSIS — R4189 Other symptoms and signs involving cognitive functions and awareness: Secondary | ICD-10-CM

## 2023-03-10 DIAGNOSIS — R519 Headache, unspecified: Secondary | ICD-10-CM

## 2023-03-10 DIAGNOSIS — F330 Major depressive disorder, recurrent, mild: Secondary | ICD-10-CM | POA: Diagnosis not present

## 2023-03-10 DIAGNOSIS — D573 Sickle-cell trait: Secondary | ICD-10-CM | POA: Insufficient documentation

## 2023-03-10 NOTE — Progress Notes (Signed)
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 (37) 9 Below Average    Long Delay Free Recall 3/12 (35) 7 Well Below Average    Retention Percentage 75 (42) 21 Below Average    Recognition Discriminability -2 (23) <1 Exceptionally Low  Shape Learning       Total Trials 1-3 20/27 (64) 92 Well Above Average    Delayed Recall 7/9 (61) 86 Above Average    Retention Percentage 88 (47) 38 Average    Recognition Discriminability 8 (57) 75 Above Average  Story Learning       Immediate Recall 51/80 (41) 18 Below Average    Delayed Recall 30/40 (47) 38 Average    Retention Percentage 100 (55) 69 Average  Daily Living Memory       Immediate Recall 25/51 (30) 2 Well Below Average    Delayed Recall 8/17 (33) 5 Well Below Average    Retention Percentage 67 (36) 8 Well Below Average    Recognition Hits 6/10 (28) 2 Well Below Average       Attention/Executive Function:     Trail Making Test (TMT): Raw Score (T Score) Percentile     Part A 43 secs.,  1 error (50) 50 Average    Part B 107 secs.,  0 errors (51) 54 Average         Scaled Score Percentile   WAIS-IV Coding: 7 16 Below Average       NAB Attention Module, Form 1: T Score  Percentile     Digits Forward 42 21 Below Average    Digits Backwards 39 14 Below Average       D-KEFS Color-Word Interference Test: Raw Score (Scaled Score) Percentile     Color Naming 31 secs. (11) 63 Average    Word Reading 25 secs. (10) 50 Average    Inhibition 79 secs. (8) 25 Average      Total Errors 8 errors (3) 1 Exceptionally Low    Inhibition/Switching 73 secs. (11) 63 Average      Total Errors 5 errors (8) 25 Average       D-KEFS Verbal Fluency Test: Raw Score (Scaled Score) Percentile     Letter Total Correct 30 (8) 25 Average    Category Total Correct 30 (8) 25 Average    Category Switching Total Correct 10 (7)  16 Below Average    Category Switching Accuracy 8 (7) 16 Below Average      Total Set Loss Errors 2 (10) 50 Average      Total Repetition Errors 2 (11) 63 Average       Language:     Verbal Fluency Test: Raw Score (T Score) Percentile     Phonemic Fluency (FAS) 30 (50) 50 Average    Animal Fluency 16 (53) 62 Average        NAB Language Module, Form 1: T Score Percentile     Naming 30/31 (57) 75 Above Average       Visuospatial/Visuoconstruction:      Raw Score Percentile   Clock Drawing: 9/10 --- Within Normal Limits       NAB Spatial Module, Form 1: T Score Percentile     Figure Drawing Copy 57 75 Above Average        Scaled Score Percentile   WAIS-IV Block Design: 8 25 Average  WAIS-IV Matrix Reasoning: 8 25 Average       Mood and Personality:      Raw Score Percentile   Beck Depression Inventory - II: 14 --- Mild  PROMIS Anxiety Questionnaire: 14 --- None to Slight       Personality Assessment Inventory: T Score  Percentile     Inconsistency 55 --- Within Normal Limits    Infrequency 51 --- Within Normal Limits    Negative Impression 73 --- Moderate    Positive Impression 54 --- Within Normal Limits    Somatic Complaints 76 --- Elevated    Anxiety 52 --- Within Normal Limits    Anxiety-Related Disorders 45 --- Within Normal Limits     Depression 50 --- Within Normal Limits    Mania 49 --- Within Normal Limits    Paranoia 49 --- Within Normal Limits    Schizophrenia 50 --- Within Normal Limits    Borderline Features 47 --- Within Normal Limits    Antisocial Features 38 --- Within Normal Limits    Alcohol Problems 41 --- Within Normal Limits    Drug Problems 58 --- Within Normal Limits    Aggression 45 --- Within Normal Limits    Suicidal Ideation 43 --- Within Normal Limits    Stress 57 --- Within Normal Limits    Non Support 45 --- Within Normal Limits    Treatment Rejection 53 --- Within Normal Limits    Dominance 42 --- Within Normal Limits    Warmth 67 --- Within Normal Limits       Additional Questionnaires:      Raw Score Percentile   PROMIS Sleep Disturbance Questionnaire: 18 --- None to Slight   Informed Consent and Coding/Compliance:   The current evaluation represents a clinical evaluation for the purposes previously outlined by the referral source and is in no way reflective of a forensic evaluation.   Ms. Carriero was provided with a verbal description of the nature and purpose of the present neuropsychological evaluation. Also reviewed were the foreseeable risks and/or discomforts and benefits of the procedure, limits of confidentiality, and mandatory reporting requirements of this provider. The patient was given the opportunity to ask questions and receive answers about the evaluation. Oral consent to participate was provided by the patient.   This evaluation was conducted by Newman Nickels, Ph.D., ABPP-CN, board certified clinical neuropsychologist. Ms. Mccartin completed a clinical interview with Dr. Milbert Coulter, billed as one unit [phone], and 175 minutes of cognitive testing and scoring, billed  as one unit [id number] and five additional units [id number]. Psychometrist Shan Levans, B.S., assisted Dr. Milbert Coulter with test administration and scoring procedures. As a separate and discrete service, one unit [id number] and two units [phone]  were billed for Dr. Tammy Sours time spent in interpretation and report writing.

## 2023-03-10 NOTE — Progress Notes (Signed)
   Psychometrician Note   Cognitive testing was administered to Ruth Crawford by Shan Levans, B.S. (psychometrist) under the supervision of Dr. Newman Nickels, Ph.D., licensed psychologist on 03/10/2023. Ms. Vanosdol did not appear overtly distressed by the testing session per behavioral observation or responses across self-report questionnaires. Rest breaks were offered.    The battery of tests administered was selected by Dr. Newman Nickels, Ph.D. with consideration to Ms. Rios's current level of functioning, the nature of her symptoms, emotional and behavioral responses during interview, level of literacy, observed level of motivation/effort, and the nature of the referral question. This battery was communicated to the psychometrist. Communication between Dr. Newman Nickels, Ph.D. and the psychometrist was ongoing throughout the evaluation and Dr. Newman Nickels, Ph.D. was immediately accessible at all times. Dr. Newman Nickels, Ph.D. provided supervision to the psychometrist on the date of this service to the extent necessary to assure the quality of all services provided.    MIKELA SENN will return within approximately 1-2 weeks for an interactive feedback session with Dr. Milbert Coulter at which time her test performances, clinical impressions, and treatment recommendations will be reviewed in detail. Ms. Puccini understands she can contact our office should she require our assistance before this time.  A total of 175 minutes of billable time were spent face-to-face with Ms. Gerald by the psychometrist. This includes both test administration and scoring time. Billing for these services is reflected in the clinical report generated by Dr. Newman Nickels, Ph.D.  This note reflects time spent with the psychometrician and does not include test scores or any clinical interpretations made by Dr. Milbert Coulter. The full report will follow in a separate note.

## 2023-03-11 ENCOUNTER — Encounter: Payer: Self-pay | Admitting: Internal Medicine

## 2023-03-11 ENCOUNTER — Encounter: Payer: Self-pay | Admitting: Neurology

## 2023-03-11 ENCOUNTER — Ambulatory Visit (INDEPENDENT_AMBULATORY_CARE_PROVIDER_SITE_OTHER): Payer: BC Managed Care – PPO | Admitting: Internal Medicine

## 2023-03-11 VITALS — BP 134/72 | HR 74 | Ht 60.0 in | Wt 120.6 lb

## 2023-03-11 DIAGNOSIS — Z794 Long term (current) use of insulin: Secondary | ICD-10-CM

## 2023-03-11 DIAGNOSIS — E1165 Type 2 diabetes mellitus with hyperglycemia: Secondary | ICD-10-CM | POA: Diagnosis not present

## 2023-03-11 LAB — POCT GLYCOSYLATED HEMOGLOBIN (HGB A1C): Hemoglobin A1C: 8 % — AB (ref 4.0–5.6)

## 2023-03-11 MED ORDER — GLIPIZIDE 10 MG PO TABS: 10.0000 mg | ORAL_TABLET | Freq: Two times a day (BID) | ORAL | 3 refills | Status: DC
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
## 2023-03-11 NOTE — Patient Instructions (Signed)
-   Increase Glipizide 5 mg, 2 tablet before Breakfast and 2 tablet before Supper  - Continue Metformin 1000 mg twice daily  - Continue Semglee 6 units once  daily     HOW TO TREAT LOW BLOOD SUGARS (Blood sugar LESS THAN 70 MG/DL) Please follow the RULE OF 15 for the treatment of hypoglycemia treatment (when your (blood sugars are less than 70 mg/dL)   STEP 1: Take 15 grams of carbohydrates when your blood sugar is low, which includes:  3-4 GLUCOSE TABS  OR 3-4 OZ OF JUICE OR REGULAR SODA OR ONE TUBE OF GLUCOSE GEL    STEP 2: RECHECK blood sugar in 15 MINUTES STEP 3: If your blood sugar is still low at the 15 minute recheck --> then, go back to STEP 1 and treat AGAIN with another 15 grams of carbohydrates.

## 2023-03-12 ENCOUNTER — Ambulatory Visit
Admission: RE | Admit: 2023-03-12 | Discharge: 2023-03-12 | Disposition: A | Discharge status: Home / Self Care | Payer: BC Managed Care – PPO | Source: Ambulatory Visit | Attending: Neurology | Admitting: Neurology

## 2023-03-12 DIAGNOSIS — R413 Other amnesia: Secondary | ICD-10-CM

## 2023-03-12 NOTE — Progress Notes (Signed)
Patient advised.

## 2023-03-16 ENCOUNTER — Other Ambulatory Visit: Payer: BC Managed Care – PPO

## 2023-03-19 ENCOUNTER — Ambulatory Visit (INDEPENDENT_AMBULATORY_CARE_PROVIDER_SITE_OTHER): Payer: BC Managed Care – PPO | Admitting: Psychology

## 2023-03-19 DIAGNOSIS — R519 Headache, unspecified: Secondary | ICD-10-CM

## 2023-03-19 DIAGNOSIS — R4189 Other symptoms and signs involving cognitive functions and awareness: Secondary | ICD-10-CM

## 2023-03-19 DIAGNOSIS — F330 Major depressive disorder, recurrent, mild: Secondary | ICD-10-CM

## 2023-03-19 NOTE — Progress Notes (Signed)
   Neuropsychology Feedback Session Ruth Crawford. [facility] Department of Neurology  Reason for Referral:   Ruth Crawford is a 67 y.o. right-handed African-American female referred by Shon Millet, D.O., to characterize her current cognitive functioning and assist with diagnostic clarity and treatment planning in the context of subjective cognitive decline.   Feedback:   Ruth Crawford completed a comprehensive neuropsychological evaluation on 03/10/2023. Please refer to that encounter for the full report and recommendations. Briefly, results suggested performance variability across all aspects of verbal learning and memory (i.e., encoding, delayed retrieval, and recognition/consolidation). Performances across all other assessed cognitive domains were appropriate when compared against age-matched peers. The etiology for verbal memory variability/weakness is unclear. She demonstrated intact performances across a story-based task (i.e., structured, pre-organized, contextualized information) but did struggle learning a list of words and various medication instructions. Memory variability as a whole is a relatively non-specific finding. This sort of difficulty can be created by numerous variables including significant day-to-day stress, mild psychiatric distress (e.g., anxiety, depression), chronic pain and other orthopedic discomfort, frequent headaches, untreated obstructive sleep apnea, and other cardiovascular/medical ailments. It would appear most likely that these variables, especially in combination, offer the best explanation for objective weaknesses and subjective memory complaints. Despite some verbal memory variability, overall memory performances coupled with intact performances across other areas of cognitive functioning is not suggestive of Alzheimer's disease.  Ruth Crawford was accompanied by her granddaughter during the current feedback session. Content of the current session focused  on the results of her neuropsychological evaluation. Ruth Crawford was given the opportunity to ask questions and her questions were answered. She was encouraged to reach out should additional questions arise. A copy of her report was provided at the conclusion of the visit.      One unit [phone] was billed for Dr. Tammy Sours time spent preparing for, conducting, and documenting the current feedback session with Ruth Crawford.

## 2023-03-24 ENCOUNTER — Encounter: Payer: Self-pay | Admitting: Internal Medicine

## 2023-03-24 MED ORDER — DULOXETINE HCL 60 MG PO CPEP: 60.0000 mg | ORAL_CAPSULE | Freq: Every day | ORAL | 3 refills | Status: DC

## 2023-03-27 ENCOUNTER — Encounter: Payer: Self-pay | Admitting: Internal Medicine

## 2023-04-01 ENCOUNTER — Encounter: Payer: Self-pay | Admitting: Internal Medicine

## 2023-04-07 ENCOUNTER — Ambulatory Visit: Payer: BC Managed Care – PPO | Admitting: Internal Medicine

## 2023-04-15 ENCOUNTER — Ambulatory Visit: Payer: BC Managed Care – PPO | Admitting: Internal Medicine

## 2023-04-16 ENCOUNTER — Ambulatory Visit (INDEPENDENT_AMBULATORY_CARE_PROVIDER_SITE_OTHER): Payer: BC Managed Care – PPO | Admitting: Internal Medicine

## 2023-04-16 VITALS — BP 138/60 | HR 86 | Temp 98.2°F | Ht 60.0 in | Wt 119.0 lb

## 2023-04-16 DIAGNOSIS — E7800 Pure hypercholesterolemia, unspecified: Secondary | ICD-10-CM

## 2023-04-16 DIAGNOSIS — E538 Deficiency of other specified B group vitamins: Secondary | ICD-10-CM

## 2023-04-16 DIAGNOSIS — M7701 Medial epicondylitis, right elbow: Secondary | ICD-10-CM

## 2023-04-16 DIAGNOSIS — I10 Essential (primary) hypertension: Secondary | ICD-10-CM

## 2023-04-16 DIAGNOSIS — G4733 Obstructive sleep apnea (adult) (pediatric): Secondary | ICD-10-CM

## 2023-04-16 DIAGNOSIS — E611 Iron deficiency: Secondary | ICD-10-CM

## 2023-04-16 DIAGNOSIS — E1165 Type 2 diabetes mellitus with hyperglycemia: Secondary | ICD-10-CM

## 2023-04-16 DIAGNOSIS — E559 Vitamin D deficiency, unspecified: Secondary | ICD-10-CM

## 2023-04-16 MED ORDER — CYCLOBENZAPRINE HCL 10 MG PO TABS: 10.0000 mg | ORAL_TABLET | Freq: Every day | ORAL | 3 refills | Status: DC

## 2023-04-16 NOTE — Patient Instructions (Signed)
Please continue all other medications as before, and refills have been done if requested.  Please have the pharmacy call with any other refills you may need.  Please continue your efforts at being more active, low cholesterol diet, and weight control.  Please keep your appointments with your specialists as you may have planned - endocrinology as planned  Please make an Appointment to return in 6 months, or sooner if needed, also with Lab Appointment for testing done 3-5 days before at the FIRST FLOOR Lab (so this is for TWO appointments - please see the scheduling desk as you leave)
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
## 2023-04-19 ENCOUNTER — Encounter: Payer: Self-pay | Admitting: Internal Medicine

## 2023-04-19 DIAGNOSIS — M7700 Medial epicondylitis, unspecified elbow: Secondary | ICD-10-CM | POA: Insufficient documentation

## 2023-04-19 NOTE — Assessment & Plan Note (Signed)
Mod, to start oral appliance soon

## 2023-04-19 NOTE — Assessment & Plan Note (Signed)
Lab Results  Component Value Date   VITAMINB12 552 10/07/2022   Stable, cont oral replacement - b12 1000 mcg qd

## 2023-04-19 NOTE — Assessment & Plan Note (Signed)
BP Readings from Last 3 Encounters:  04/16/23 138/60  03/11/23 134/72  03/06/23 (!) 146/72   Stable, pt to continue medical treatment lisinopril 20 qd

## 2023-04-19 NOTE — Assessment & Plan Note (Signed)
Mild to mod, for OTC voltaren gel prn, to f/u any worsening symptoms or concerns

## 2023-04-19 NOTE — Assessment & Plan Note (Signed)
Lab Results  Component Value Date   LDLCALC 47 10/07/2022   Stable, pt to continue current statin lovastatin 40 qd

## 2023-05-26 ENCOUNTER — Ambulatory Visit: Payer: BC Managed Care – PPO | Admitting: Dietician

## 2023-06-25 ENCOUNTER — Other Ambulatory Visit: Payer: Self-pay | Admitting: Internal Medicine
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
## 2023-07-21 ENCOUNTER — Ambulatory Visit: Payer: BC Managed Care – PPO | Admitting: Neurology

## 2023-08-11 ENCOUNTER — Ambulatory Visit: Payer: BC Managed Care – PPO | Admitting: Internal Medicine

## 2023-09-07 ENCOUNTER — Encounter: Payer: Self-pay | Admitting: Internal Medicine

## 2023-09-07 DIAGNOSIS — M6530 Trigger finger, unspecified finger: Secondary | ICD-10-CM

## 2023-10-01 ENCOUNTER — Other Ambulatory Visit: Payer: Self-pay | Admitting: Internal Medicine

## 2023-10-03 ENCOUNTER — Other Ambulatory Visit: Payer: Self-pay | Admitting: Internal Medicine

## 2023-10-08 ENCOUNTER — Ambulatory Visit: Payer: BC Managed Care – PPO | Admitting: Internal Medicine

## 2023-10-08 ENCOUNTER — Encounter: Payer: Self-pay | Admitting: Internal Medicine

## 2023-10-08 VITALS — BP 138/88 | HR 92 | Resp 16 | Ht 60.0 in | Wt 125.0 lb

## 2023-10-08 DIAGNOSIS — E1165 Type 2 diabetes mellitus with hyperglycemia: Secondary | ICD-10-CM | POA: Diagnosis not present

## 2023-10-08 DIAGNOSIS — E785 Hyperlipidemia, unspecified: Secondary | ICD-10-CM

## 2023-10-08 DIAGNOSIS — E1129 Type 2 diabetes mellitus with other diabetic kidney complication: Secondary | ICD-10-CM

## 2023-10-08 DIAGNOSIS — Z794 Long term (current) use of insulin: Secondary | ICD-10-CM

## 2023-10-08 DIAGNOSIS — R809 Proteinuria, unspecified: Secondary | ICD-10-CM

## 2023-10-08 LAB — POCT GLYCOSYLATED HEMOGLOBIN (HGB A1C): Hemoglobin A1C: 8.9 % — AB (ref 4.0–5.6)

## 2023-10-08 MED ORDER — TIRZEPATIDE 5 MG/0.5ML ~~LOC~~ SOAJ: 5.0000 mg | SUBCUTANEOUS | 3 refills | Status: DC

## 2023-10-08 MED ORDER — GLIPIZIDE 10 MG PO TABS: 10.0000 mg | ORAL_TABLET | Freq: Two times a day (BID) | ORAL | 3 refills | Status: DC

## 2023-10-08 MED ORDER — TIRZEPATIDE 2.5 MG/0.5ML ~~LOC~~ SOAJ: 2.5000 mg | SUBCUTANEOUS | 0 refills | Status: DC

## 2023-10-08 MED ORDER — METFORMIN HCL 1000 MG PO TABS: 1000.0000 mg | ORAL_TABLET | Freq: Two times a day (BID) | ORAL | 3 refills | Status: AC

## 2023-10-08 NOTE — Patient Instructions (Signed)
-  Start Mounjaro 2.5 mg weekly for 4 weeks, then increase to 5 mg weekly -  Increase Glipizide 5 mg, 2 tablet before Breakfast and 2 tablet before Supper  - Continue Metformin 1000 mg twice daily  - Continue Semglee 6 units once  daily     HOW TO TREAT LOW BLOOD SUGARS (Blood sugar LESS THAN 70 MG/DL) Please follow the RULE OF 15 for the treatment of hypoglycemia treatment (when your (blood sugars are less than 70 mg/dL)   STEP 1: Take 15 grams of carbohydrates when your blood sugar is low, which includes:  3-4 GLUCOSE TABS  OR 3-4 OZ OF JUICE OR REGULAR SODA OR ONE TUBE OF GLUCOSE GEL    STEP 2: RECHECK blood sugar in 15 MINUTES STEP 3: If your blood sugar is still low at the 15 minute recheck --> then, go back to STEP 1 and treat AGAIN with another 15 grams of carbohydrates.
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
## 2023-10-09 ENCOUNTER — Encounter: Payer: Self-pay | Admitting: Internal Medicine

## 2023-10-09 DIAGNOSIS — E1129 Type 2 diabetes mellitus with other diabetic kidney complication: Secondary | ICD-10-CM | POA: Insufficient documentation

## 2023-10-09 LAB — MICROALBUMIN / CREATININE URINE RATIO
Creatinine, Urine: 97 mg/dL (ref 20–275)
Microalb Creat Ratio: 40 mg/g{creat} — ABNORMAL HIGH (ref <30)
Microalb, Ur: 3.9 mg/dL

## 2023-10-09 LAB — BASIC METABOLIC PANEL
BUN: 14 mg/dL (ref 7–25)
CO2: 27 mmol/L (ref 20–32)
Calcium: 9.3 mg/dL (ref 8.6–10.4)
Chloride: 104 mmol/L (ref 98–110)
Creat: 0.83 mg/dL (ref 0.50–1.05)
Glucose, Bld: 212 mg/dL — ABNORMAL HIGH (ref 65–99)
Potassium: 4.3 mmol/L (ref 3.5–5.3)
Sodium: 139 mmol/L (ref 135–146)

## 2023-10-09 LAB — LIPID PANEL
Cholesterol: 144 mg/dL (ref <200)
HDL: 68 mg/dL (ref >=50)
LDL Cholesterol (Calc): 57 mg/dL
Non-HDL Cholesterol (Calc): 76 mg/dL (ref <130)
Total CHOL/HDL Ratio: 2.1 (calc) (ref <5.0)
Triglycerides: 105 mg/dL (ref <150)

## 2023-10-09 LAB — TSH: TSH: 0.84 m[IU]/L (ref 0.40–4.50)

## 2023-10-09 LAB — T4, FREE: Free T4: 1.2 ng/dL (ref 0.8–1.8)

## 2023-10-09 MED ORDER — LISINOPRIL 40 MG PO TABS: 40.0000 mg | ORAL_TABLET | Freq: Every day | ORAL | 3 refills | Status: AC

## 2023-10-09 MED ORDER — LOVASTATIN 40 MG PO TABS: 40.0000 mg | ORAL_TABLET | Freq: Every day | ORAL | 3 refills | Status: DC

## 2023-10-15 ENCOUNTER — Ambulatory Visit (INDEPENDENT_AMBULATORY_CARE_PROVIDER_SITE_OTHER): Payer: BC Managed Care – PPO | Admitting: Internal Medicine

## 2023-10-15 ENCOUNTER — Encounter: Payer: Self-pay | Admitting: Internal Medicine

## 2023-10-15 VITALS — BP 128/80 | HR 103 | Ht 60.0 in | Wt 122.4 lb

## 2023-10-15 DIAGNOSIS — E1165 Type 2 diabetes mellitus with hyperglycemia: Secondary | ICD-10-CM

## 2023-10-15 DIAGNOSIS — I10 Essential (primary) hypertension: Secondary | ICD-10-CM

## 2023-10-15 DIAGNOSIS — Z0001 Encounter for general adult medical examination with abnormal findings: Secondary | ICD-10-CM

## 2023-10-15 DIAGNOSIS — E7800 Pure hypercholesterolemia, unspecified: Secondary | ICD-10-CM | POA: Diagnosis not present

## 2023-10-15 DIAGNOSIS — Z7984 Long term (current) use of oral hypoglycemic drugs: Secondary | ICD-10-CM

## 2023-10-15 DIAGNOSIS — M65331 Trigger finger, right middle finger: Secondary | ICD-10-CM

## 2023-10-15 DIAGNOSIS — Z794 Long term (current) use of insulin: Secondary | ICD-10-CM

## 2023-10-15 DIAGNOSIS — E538 Deficiency of other specified B group vitamins: Secondary | ICD-10-CM

## 2023-10-15 DIAGNOSIS — Z7985 Long-term (current) use of injectable non-insulin antidiabetic drugs: Secondary | ICD-10-CM

## 2023-10-15 MED ORDER — ROSUVASTATIN CALCIUM 40 MG PO TABS: 40.0000 mg | ORAL_TABLET | Freq: Every day | ORAL | 3 refills | Status: DC

## 2023-10-15 NOTE — Patient Instructions (Signed)
Ok to change the lovastatin to the Crestor 40 mg per day  Please continue all other medications as before, and refills have been done if requested.  Please have the pharmacy call with any other refills you may need.  Please continue your efforts at being more active, low cholesterol diet, and weight control.  You are otherwise up to date with prevention measures today.  Please keep your appointments with your specialists as you may have planned  You will be contacted regarding the referral for: Hand Surgury  We can hold on further lab testing today  Please make an Appointment to return for your 1 year visit, or sooner if needed
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
## 2023-10-19 ENCOUNTER — Encounter: Payer: Self-pay | Admitting: Internal Medicine

## 2023-10-19 DIAGNOSIS — Z0001 Encounter for general adult medical examination with abnormal findings: Secondary | ICD-10-CM | POA: Insufficient documentation

## 2023-10-19 DIAGNOSIS — M65331 Trigger finger, right middle finger: Secondary | ICD-10-CM | POA: Insufficient documentation

## 2023-10-19 NOTE — Assessment & Plan Note (Addendum)
Uncontrolled, for change lovastatin to crestor 40 every day and f/u lab today

## 2023-10-19 NOTE — Assessment & Plan Note (Signed)
Age and sex appropriate education and counseling updated with regular exercise and diet Referrals for preventative services - pt to call for optho exam Immunizations addressed - declines covid booster and flu shot Smoking counseling  - none needed Evidence for depression or other mood disorder - none significant Most recent labs reviewed. I have personally reviewed and have noted: 1) the patient's medical and social history 2) The patient's current medications and supplements 3) The patient's height, weight, and BMI have been recorded in the chart

## 2023-10-19 NOTE — Assessment & Plan Note (Signed)
Lab Results  Component Value Date   VITAMINB12 552 10/07/2022   Stable, cont oral replacement - b12 1000 mcg qd

## 2023-10-19 NOTE — Assessment & Plan Note (Signed)
BP Readings from Last 3 Encounters:  10/15/23 128/80  10/08/23 138/88  04/16/23 138/60   Stable, pt to continue medical treatment lisinopril 40 qd

## 2023-10-19 NOTE — Assessment & Plan Note (Signed)
Worsening recently - for hand surgury referral

## 2023-10-19 NOTE — Assessment & Plan Note (Signed)
Lab Results  Component Value Date   HGBA1C 8.9 (A) 10/08/2023   Uncontrolled,  pt to continue current medical treatment glucotorl xl 10 every day, semglee 6 u every day, metformin 1000 bid and now mounjaro 5 q wk

## 2023-11-05 ENCOUNTER — Ambulatory Visit: Payer: BC Managed Care – PPO | Admitting: Internal Medicine

## 2023-11-18 ENCOUNTER — Encounter: Payer: Self-pay | Admitting: Internal Medicine

## 2023-11-18 NOTE — Telephone Encounter (Signed)
 Patient's mailbox is full. Unable to leave a message. 2nd attempt.

## 2023-11-18 NOTE — Telephone Encounter (Signed)
 Copied From CRM [id number]. Reason for Triage: Patient called in stating she has been vomiting every time she eats something can not keep it down. Wants a nurse to give her a call on what she needs to do.      Mailbox was full and no answer with patient's number [phone]. Left generic HIPAA compliant voicemail on spouse's phone (as per DPR able to leave a message here) 1st attempt.

## 2023-11-18 NOTE — Telephone Encounter (Signed)
 Mailbox is full on patient's voicemail. Unable to leave a voicemail. 3rd attempt.  Called Spouse as listed on DPR and left a generic HIPAA compliant voicemail for a call back.  Unable to contact patient. 4th attempt altogether. Reason for Disposition . Third attempt to contact caller AND no contact made. Phone number verified.  Protocols used: No Contact or Duplicate Contact Call-A-AH

## 2023-11-18 NOTE — Telephone Encounter (Signed)
 This encounter was created in error - please disregard.

## 2023-11-19 ENCOUNTER — Other Ambulatory Visit: Payer: Self-pay | Admitting: Internal Medicine

## 2023-11-19 DIAGNOSIS — K253 Acute gastric ulcer without hemorrhage or perforation: Secondary | ICD-10-CM

## 2023-11-20 ENCOUNTER — Telehealth: Payer: Self-pay

## 2023-11-20 ENCOUNTER — Other Ambulatory Visit: Payer: Self-pay

## 2023-11-20 NOTE — Telephone Encounter (Signed)
 Copied from CRM [id number]. Topic: General - Other >> Nov 20, 2023  9:09 AM Truddie Crumble wrote: Reason for CRM: patient called stating she went to the emergency room because she was throwing up and they stated she had the Noro stomach virus. The patient was giving medication for vomiting, but she is still nauseated and patient does not know if she has to give it one more day or come in for an appointment

## 2023-11-20 NOTE — Telephone Encounter (Signed)
 I looked at the mar 4 lab testing including the urine - all looked pretty good and essentially normal for her.  Should be ok to give it least one more day, but I can send further nausea med or even diarrhea med if needed    thanks

## 2023-11-21 NOTE — Telephone Encounter (Signed)
 Called and let Pt know

## 2023-11-25 ENCOUNTER — Ambulatory Visit (INDEPENDENT_AMBULATORY_CARE_PROVIDER_SITE_OTHER): Payer: Self-pay

## 2023-11-25 ENCOUNTER — Ambulatory Visit (INDEPENDENT_AMBULATORY_CARE_PROVIDER_SITE_OTHER): Payer: BC Managed Care – PPO | Admitting: Orthopedic Surgery

## 2023-11-25 DIAGNOSIS — M65331 Trigger finger, right middle finger: Secondary | ICD-10-CM

## 2023-11-25 LAB — HM MAMMOGRAPHY

## 2023-11-25 MED ORDER — LIDOCAINE HCL 1 % IJ SOLN
1.0000 mL | INTRAMUSCULAR | Status: AC | PRN
  Administered 2023-11-25: 1 mL

## 2023-11-25 MED ORDER — BETAMETHASONE SOD PHOS & ACET 6 (3-3) MG/ML IJ SUSP
6.0000 mg | INTRAMUSCULAR | Status: AC | PRN
  Administered 2023-11-25: 6 mg via INTRA_ARTICULAR
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
## 2023-12-06 ENCOUNTER — Other Ambulatory Visit: Payer: Self-pay | Admitting: Internal Medicine

## 2023-12-27 ENCOUNTER — Other Ambulatory Visit: Payer: Self-pay | Admitting: Internal Medicine

## 2023-12-29 ENCOUNTER — Other Ambulatory Visit: Payer: Self-pay

## 2024-01-06 ENCOUNTER — Ambulatory Visit (INDEPENDENT_AMBULATORY_CARE_PROVIDER_SITE_OTHER): Admitting: Orthopedic Surgery

## 2024-01-06 DIAGNOSIS — M65331 Trigger finger, right middle finger: Secondary | ICD-10-CM
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
## 2024-01-12 ENCOUNTER — Ambulatory Visit (INDEPENDENT_AMBULATORY_CARE_PROVIDER_SITE_OTHER): Admitting: Internal Medicine

## 2024-01-12 ENCOUNTER — Encounter: Payer: Self-pay | Admitting: Internal Medicine

## 2024-01-12 VITALS — BP 110/68 | HR 97 | Temp 98.2°F | Ht 60.0 in | Wt 121.0 lb

## 2024-01-12 DIAGNOSIS — M5412 Radiculopathy, cervical region: Secondary | ICD-10-CM | POA: Diagnosis not present

## 2024-01-12 DIAGNOSIS — E538 Deficiency of other specified B group vitamins: Secondary | ICD-10-CM | POA: Diagnosis not present

## 2024-01-12 DIAGNOSIS — I10 Essential (primary) hypertension: Secondary | ICD-10-CM

## 2024-01-12 DIAGNOSIS — Z794 Long term (current) use of insulin: Secondary | ICD-10-CM

## 2024-01-12 DIAGNOSIS — E1165 Type 2 diabetes mellitus with hyperglycemia: Secondary | ICD-10-CM

## 2024-01-12 MED ORDER — METHYLPREDNISOLONE 4 MG PO TBPK: ORAL_TABLET | ORAL | 0 refills | Status: DC

## 2024-01-12 MED ORDER — CYCLOBENZAPRINE HCL 5 MG PO TABS: 5.0000 mg | ORAL_TABLET | Freq: Three times a day (TID) | ORAL | 1 refills | Status: DC | PRN

## 2024-01-12 MED ORDER — HYDROCODONE-ACETAMINOPHEN 7.5-325 MG PO TABS: 1.0000 | ORAL_TABLET | Freq: Four times a day (QID) | ORAL | 0 refills | Status: DC | PRN

## 2024-01-12 NOTE — Assessment & Plan Note (Signed)
 Lab Results  Component Value Date   HGBA1C 8.9 (A) 10/08/2023   uncontrolled, pt to continue current medical treatment glucotrol  xl 10 bid, semglee  6 u every day, metformin  1000 bid, mounjaro 5 mg weekly, declines other change for now

## 2024-01-12 NOTE — Assessment & Plan Note (Signed)
 Mild signs but severe pain; for hydrocodone  7.5 prn, flexeril  5 tid prn, and medrol  pack , declines PT for now, but will need repeat mri for any worsening s/s

## 2024-01-12 NOTE — Assessment & Plan Note (Signed)
 BP Readings from Last 3 Encounters:  01/12/24 110/68  10/15/23 128/80  10/08/23 138/88   Stable, pt to continue medical treatment lisinopril  40 qd
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
## 2024-01-12 NOTE — Assessment & Plan Note (Signed)
Lab Results  Component Value Date   VITAMINB12 552 10/07/2022   Stable, cont oral replacement - b12 1000 mcg qd

## 2024-01-12 NOTE — Patient Instructions (Signed)
 Please take all new medication as prescribed - the pain medication, muscle relaxer as needed, and medrol  pak  Please continue all other medications as before, and refills have been done if requested.  Please have the pharmacy call with any other refills you may need.  Please keep your appointments with your specialists as you may have planned  Please return if not improved or getting worse, as you may need MRI

## 2024-01-14 ENCOUNTER — Encounter: Payer: Self-pay | Admitting: Internal Medicine

## 2024-01-14 ENCOUNTER — Telehealth: Payer: Self-pay | Admitting: Internal Medicine

## 2024-01-14 ENCOUNTER — Telehealth (INDEPENDENT_AMBULATORY_CARE_PROVIDER_SITE_OTHER): Payer: BC Managed Care – PPO | Admitting: Internal Medicine

## 2024-01-14 VITALS — Ht 60.0 in

## 2024-01-14 DIAGNOSIS — E785 Hyperlipidemia, unspecified: Secondary | ICD-10-CM | POA: Diagnosis not present

## 2024-01-14 DIAGNOSIS — Z794 Long term (current) use of insulin: Secondary | ICD-10-CM | POA: Diagnosis not present

## 2024-01-14 DIAGNOSIS — E1165 Type 2 diabetes mellitus with hyperglycemia: Secondary | ICD-10-CM

## 2024-01-14 DIAGNOSIS — E1129 Type 2 diabetes mellitus with other diabetic kidney complication: Secondary | ICD-10-CM

## 2024-01-14 DIAGNOSIS — R809 Proteinuria, unspecified: Secondary | ICD-10-CM

## 2024-01-14 MED ORDER — GLIPIZIDE 10 MG PO TABS: 10.0000 mg | ORAL_TABLET | Freq: Two times a day (BID) | ORAL | 3 refills | Status: DC

## 2024-01-14 MED ORDER — INSULIN GLARGINE-YFGN 100 UNIT/ML ~~LOC~~ SOPN: 8.0000 [IU] | PEN_INJECTOR | Freq: Every day | SUBCUTANEOUS | 3 refills | Status: DC

## 2024-01-14 MED ORDER — RELION PEN NEEDLES 32G X 4 MM MISC: 1.0000 | Freq: Every day | 10 refills | Status: DC
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
## 2024-01-14 NOTE — Telephone Encounter (Signed)
 Please contact the patient and schedule for follow-up with me in 3 months    Thanks

## 2024-01-14 NOTE — Patient Instructions (Signed)
-    Increase Glipizide  5 mg, 2 tablet before Breakfast and 2 tablet before Supper  - Continue Metformin  1000 mg twice daily  - Increase Semglee  8 units once  daily     HOW TO TREAT LOW BLOOD SUGARS (Blood sugar LESS THAN 70 MG/DL) Please follow the RULE OF 15 for the treatment of hypoglycemia treatment (when your (blood sugars are less than 70 mg/dL)   STEP 1: Take 15 grams of carbohydrates when your blood sugar is low, which includes:  3-4 GLUCOSE TABS  OR 3-4 OZ OF JUICE OR REGULAR SODA OR ONE TUBE OF GLUCOSE GEL    STEP 2: RECHECK blood sugar in 15 MINUTES STEP 3: If your blood sugar is still low at the 15 minute recheck --> then, go back to STEP 1 and treat AGAIN with another 15 grams of carbohydrates.

## 2024-02-17 ENCOUNTER — Ambulatory Visit (INDEPENDENT_AMBULATORY_CARE_PROVIDER_SITE_OTHER): Admitting: Orthopedic Surgery

## 2024-02-17 DIAGNOSIS — M65331 Trigger finger, right middle finger: Secondary | ICD-10-CM | POA: Diagnosis not present
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
## 2024-02-20 ENCOUNTER — Telehealth (INDEPENDENT_AMBULATORY_CARE_PROVIDER_SITE_OTHER): Admitting: Internal Medicine

## 2024-02-20 ENCOUNTER — Encounter: Payer: Self-pay | Admitting: Internal Medicine

## 2024-02-20 ENCOUNTER — Ambulatory Visit: Admitting: Internal Medicine

## 2024-02-20 DIAGNOSIS — H1132 Conjunctival hemorrhage, left eye: Secondary | ICD-10-CM | POA: Diagnosis not present

## 2024-02-20 NOTE — Patient Instructions (Signed)
.  ok for cool compress to the left eye for any discomfort
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
## 2024-02-20 NOTE — Assessment & Plan Note (Signed)
 Mild to mod, asympt without vision change or pain, now for cool compress, tylenol  prn, and should heal over the next 2 wks

## 2024-02-26 ENCOUNTER — Other Ambulatory Visit: Payer: Self-pay | Admitting: Internal Medicine

## 2024-02-26 DIAGNOSIS — K253 Acute gastric ulcer without hemorrhage or perforation: Secondary | ICD-10-CM

## 2024-03-17 ENCOUNTER — Encounter: Payer: Self-pay | Admitting: Internal Medicine

## 2024-03-17 ENCOUNTER — Ambulatory Visit: Admitting: Internal Medicine

## 2024-03-17 ENCOUNTER — Other Ambulatory Visit: Payer: Self-pay | Admitting: Internal Medicine

## 2024-03-17 ENCOUNTER — Ambulatory Visit: Payer: Self-pay | Admitting: Internal Medicine

## 2024-03-17 VITALS — BP 150/86 | HR 110 | Temp 98.4°F | Ht 60.0 in | Wt 121.1 lb

## 2024-03-17 DIAGNOSIS — Z794 Long term (current) use of insulin: Secondary | ICD-10-CM

## 2024-03-17 DIAGNOSIS — E1165 Type 2 diabetes mellitus with hyperglycemia: Secondary | ICD-10-CM | POA: Diagnosis not present

## 2024-03-17 DIAGNOSIS — G2581 Restless legs syndrome: Secondary | ICD-10-CM | POA: Diagnosis not present

## 2024-03-17 DIAGNOSIS — E559 Vitamin D deficiency, unspecified: Secondary | ICD-10-CM

## 2024-03-17 DIAGNOSIS — I10 Essential (primary) hypertension: Secondary | ICD-10-CM | POA: Diagnosis not present

## 2024-03-17 DIAGNOSIS — E538 Deficiency of other specified B group vitamins: Secondary | ICD-10-CM

## 2024-03-17 LAB — LIPID PANEL
Cholesterol: 144 mg/dL (ref 0–200)
HDL: 59.7 mg/dL (ref >39.00)
LDL Cholesterol: 61 mg/dL (ref 0–99)
NonHDL: 84.41
Total CHOL/HDL Ratio: 2
Triglycerides: 117 mg/dL (ref 0.0–149.0)
VLDL: 23.4 mg/dL (ref 0.0–40.0)

## 2024-03-17 LAB — BASIC METABOLIC PANEL WITH GFR
BUN: 16 mg/dL (ref 6–23)
CO2: 28 meq/L (ref 19–32)
Calcium: 9.6 mg/dL (ref 8.4–10.5)
Chloride: 100 meq/L (ref 96–112)
Creatinine, Ser: 0.93 mg/dL (ref 0.40–1.20)
GFR: 63.35 mL/min (ref >60.00)
Glucose, Bld: 346 mg/dL — ABNORMAL HIGH (ref 70–99)
Potassium: 3.9 meq/L (ref 3.5–5.1)
Sodium: 136 meq/L (ref 135–145)

## 2024-03-17 LAB — HEPATIC FUNCTION PANEL
ALT: 8 U/L (ref 0–35)
AST: 10 U/L (ref 0–37)
Albumin: 4.2 g/dL (ref 3.5–5.2)
Alkaline Phosphatase: 65 U/L (ref 39–117)
Bilirubin, Direct: 0.1 mg/dL (ref 0.0–0.3)
Total Bilirubin: 0.4 mg/dL (ref 0.2–1.2)
Total Protein: 7.4 g/dL (ref 6.0–8.3)

## 2024-03-17 LAB — HEMOGLOBIN A1C: Hgb A1c MFr Bld: 9.4 % — ABNORMAL HIGH (ref 4.6–6.5)

## 2024-03-17 MED ORDER — INSULIN GLARGINE-YFGN 100 UNIT/ML ~~LOC~~ SOPN: 20.0000 [IU] | PEN_INJECTOR | Freq: Every day | SUBCUTANEOUS | 3 refills | Status: DC

## 2024-03-17 MED ORDER — CLONAZEPAM 1 MG PO TABS: 1.0000 mg | ORAL_TABLET | Freq: Every day | ORAL | 1 refills | Status: DC

## 2024-03-17 NOTE — Assessment & Plan Note (Signed)
 Lab Results  Component Value Date   HGBA1C 9.4 (H) 03/17/2024   uncontrolled, pt to continue current medical treatment glucotrol  xl 10 every day, metformin  1000 bid, but to increase glargine to 20 u qd
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
## 2024-03-17 NOTE — Patient Instructions (Addendum)
 Ok to try changing the Requip  (ropinarole) to Clonazepam 1 mg at bedtime  Please continue all other medications as before, and refills have been done if requested.  Please have the pharmacy call with any other refills you may need.  Please keep your appointments with your specialists as you may have planned  Please go to the LAB at the blood drawing area for the tests to be done  You will be contacted by phone if any changes need to be made immediately.  Otherwise, you will receive a letter about your results with an explanation, but please check with MyChart first.  Please make an Appointment to return in 6 months, or sooner if needed, also with Lab Appointment for testing done 3-5 days before at the FIRST FLOOR Lab (so this is for TWO appointments - please see the scheduling desk as you leave)

## 2024-03-17 NOTE — Assessment & Plan Note (Signed)
 BP Readings from Last 3 Encounters:  03/17/24 (!) 150/86  01/12/24 110/68  10/15/23 128/80   Uncontrolled to day, pt states controlled at home,, pt to continue medical treatment lisinopril  40 mg every day, declines other change

## 2024-03-17 NOTE — Assessment & Plan Note (Signed)
 With worsening symptoms, for change to klonopin 1 mg at bedtime prn

## 2024-03-17 NOTE — Assessment & Plan Note (Signed)
Lab Results  Component Value Date   VITAMINB12 552 10/07/2022   Stable, cont oral replacement - b12 1000 mcg qd

## 2024-03-24 ENCOUNTER — Encounter: Payer: Self-pay | Admitting: Internal Medicine

## 2024-03-25 ENCOUNTER — Ambulatory Visit: Admitting: Internal Medicine

## 2024-03-25 ENCOUNTER — Encounter: Payer: Self-pay | Admitting: Internal Medicine

## 2024-03-25 VITALS — Resp 98 | Ht 60.0 in | Wt 123.0 lb

## 2024-03-25 DIAGNOSIS — E785 Hyperlipidemia, unspecified: Secondary | ICD-10-CM

## 2024-03-25 DIAGNOSIS — Z794 Long term (current) use of insulin: Secondary | ICD-10-CM

## 2024-03-25 DIAGNOSIS — E1129 Type 2 diabetes mellitus with other diabetic kidney complication: Secondary | ICD-10-CM | POA: Diagnosis not present

## 2024-03-25 DIAGNOSIS — E1165 Type 2 diabetes mellitus with hyperglycemia: Secondary | ICD-10-CM

## 2024-03-25 DIAGNOSIS — R809 Proteinuria, unspecified: Secondary | ICD-10-CM

## 2024-03-25 MED ORDER — RELION PEN NEEDLES 32G X 4 MM MISC: 1.0000 | Freq: Four times a day (QID) | 3 refills | Status: AC

## 2024-03-25 MED ORDER — ACCU-CHEK GUIDE W/DEVICE KIT: 1.0000 | PACK | Freq: Every day | 0 refills | Status: AC

## 2024-03-25 MED ORDER — ACCU-CHEK GUIDE TEST VI STRP
1.0000 | ORAL_STRIP | Freq: Every day | 12 refills | Status: AC
Start: 2024-03-25 — End: ?

## 2024-03-25 MED ORDER — INSULIN LISPRO (1 UNIT DIAL) 100 UNIT/ML (KWIKPEN): 3.0000 [IU] | PEN_INJECTOR | Freq: Three times a day (TID) | SUBCUTANEOUS | 3 refills | Status: DC

## 2024-03-25 NOTE — Patient Instructions (Signed)
-   Stop Glipizide  - Continue Metformin  1000 mg twice daily  - Continue  Semglee  8 units once  daily  - Start Humalog  3 units with each meal  -Humalog  correctional insulin : ADD extra units on insulin  to your meal-time Humalog  dose if your blood sugars are higher than 180. Use the scale below to help guide you:   Blood sugar before meal Number of units to inject  Less than 180 0 unit  181 -  230 1 units  231 -  280 2 units  281 -  330 3 units  331 -  380 4 units  381 -  430 5 units  431 -  480 6 units    HOW TO TREAT LOW BLOOD SUGARS (Blood sugar LESS THAN 70 MG/DL) Please follow the RULE OF 15 for the treatment of hypoglycemia treatment (when your (blood sugars are less than 70 mg/dL)   STEP 1: Take 15 grams of carbohydrates when your blood sugar is low, which includes:  3-4 GLUCOSE TABS  OR 3-4 OZ OF JUICE OR REGULAR SODA OR ONE TUBE OF GLUCOSE GEL    STEP 2: RECHECK blood sugar in 15 MINUTES STEP 3: If your blood sugar is still low at the 15 minute recheck --> then, go back to STEP 1 and treat AGAIN with another 15 grams of carbohydrates.
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
## 2024-03-31 ENCOUNTER — Encounter: Payer: Self-pay | Admitting: Internal Medicine

## 2024-04-01 MED ORDER — ROPINIROLE HCL 3 MG PO TABS: 3.0000 mg | ORAL_TABLET | Freq: Every day | ORAL | 1 refills | Status: DC

## 2024-04-02 ENCOUNTER — Ambulatory Visit (INDEPENDENT_AMBULATORY_CARE_PROVIDER_SITE_OTHER): Admitting: Orthopedic Surgery

## 2024-04-02 DIAGNOSIS — M65331 Trigger finger, right middle finger: Secondary | ICD-10-CM | POA: Diagnosis not present
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
## 2024-04-05 ENCOUNTER — Ambulatory Visit: Admitting: Orthopedic Surgery

## 2024-04-14 ENCOUNTER — Ambulatory Visit: Admitting: Internal Medicine

## 2024-04-15 ENCOUNTER — Encounter: Payer: Self-pay | Admitting: Internal Medicine

## 2024-04-18 ENCOUNTER — Encounter: Payer: Self-pay | Admitting: Internal Medicine

## 2024-04-19 ENCOUNTER — Other Ambulatory Visit: Payer: Self-pay | Admitting: Internal Medicine

## 2024-04-21 ENCOUNTER — Encounter: Payer: Self-pay | Admitting: Internal Medicine

## 2024-04-21 ENCOUNTER — Ambulatory Visit (INDEPENDENT_AMBULATORY_CARE_PROVIDER_SITE_OTHER): Admitting: Internal Medicine

## 2024-04-21 VITALS — BP 146/80 | HR 86 | Temp 98.2°F | Ht 60.0 in | Wt 119.2 lb

## 2024-04-21 DIAGNOSIS — R1084 Generalized abdominal pain: Secondary | ICD-10-CM

## 2024-04-21 DIAGNOSIS — Z794 Long term (current) use of insulin: Secondary | ICD-10-CM | POA: Diagnosis not present

## 2024-04-21 DIAGNOSIS — E1165 Type 2 diabetes mellitus with hyperglycemia: Secondary | ICD-10-CM

## 2024-04-21 DIAGNOSIS — R109 Unspecified abdominal pain: Secondary | ICD-10-CM | POA: Insufficient documentation

## 2024-04-21 DIAGNOSIS — E538 Deficiency of other specified B group vitamins: Secondary | ICD-10-CM

## 2024-04-21 DIAGNOSIS — I10 Essential (primary) hypertension: Secondary | ICD-10-CM

## 2024-04-21 MED ORDER — ONDANSETRON 4 MG PO TBDP: 4.0000 mg | ORAL_TABLET | Freq: Three times a day (TID) | ORAL | 1 refills | Status: AC | PRN

## 2024-04-21 NOTE — Patient Instructions (Addendum)
 Please continue all other medications as before, and refills have been done for the zofran  for nausea  Please have the pharmacy call with any other refills you may need.  Please continue your efforts at being more active, low cholesterol diet, and weight control  Please keep your appointments with your specialists as you may have planned - General Surgury next Tuesday, and Endo on Oct 16  No other labs needed today  Please make an Appointment to return in 6 months, or sooner if needed

## 2024-04-21 NOTE — Assessment & Plan Note (Signed)
 Lab Results  Component Value Date   HGBA1C 9.4 (H) 03/17/2024   Uncontrolled but hopefully not an issue with possible GB surgury,  pt to continue current medical treatment and f/u endo as planned

## 2024-04-21 NOTE — Assessment & Plan Note (Signed)
 BP Readings from Last 3 Encounters:  04/21/24 (!) 146/80  03/17/24 (!) 150/86  01/12/24 110/68   Uncontrolled but likely reactive, pt to continue medical treatment lisinopril  40 qd

## 2024-04-21 NOTE — Assessment & Plan Note (Signed)
Lab Results  Component Value Date   VITAMINB12 552 10/07/2022   Stable, cont oral replacement - b12 1000 mcg qd

## 2024-04-21 NOTE — Assessment & Plan Note (Signed)
 Given persistent pain, abnormal CT and u/s, agree with need to see general surgury next tues for GB evaluation, o/w stable for zofran  odt 4 mg prn, and  to f/u any worsening symptoms or concerns
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
## 2024-04-28 ENCOUNTER — Other Ambulatory Visit: Payer: Self-pay | Admitting: Internal Medicine

## 2024-04-28 DIAGNOSIS — K253 Acute gastric ulcer without hemorrhage or perforation: Secondary | ICD-10-CM

## 2024-05-12 ENCOUNTER — Encounter: Payer: Self-pay | Admitting: Internal Medicine

## 2024-05-24 NOTE — Telephone Encounter (Signed)
 Ok to let pt know - we can easily use the A1c most recent, and otherwise does not necessarily need to be repeated, and insurance will not pay if less than 3 months.  thanks

## 2024-06-18 ENCOUNTER — Encounter: Payer: Self-pay | Admitting: Internal Medicine

## 2024-06-18 ENCOUNTER — Ambulatory Visit: Admitting: Internal Medicine

## 2024-06-18 VITALS — BP 134/86 | HR 93 | Ht 60.0 in | Wt 111.6 lb

## 2024-06-18 DIAGNOSIS — E785 Hyperlipidemia, unspecified: Secondary | ICD-10-CM | POA: Diagnosis not present

## 2024-06-18 DIAGNOSIS — Z794 Long term (current) use of insulin: Secondary | ICD-10-CM | POA: Diagnosis not present

## 2024-06-18 DIAGNOSIS — E1129 Type 2 diabetes mellitus with other diabetic kidney complication: Secondary | ICD-10-CM

## 2024-06-18 DIAGNOSIS — E1165 Type 2 diabetes mellitus with hyperglycemia: Secondary | ICD-10-CM

## 2024-06-18 DIAGNOSIS — R809 Proteinuria, unspecified: Secondary | ICD-10-CM

## 2024-06-18 LAB — POCT GLYCOSYLATED HEMOGLOBIN (HGB A1C): Hemoglobin A1C: 10.2 % — AB (ref 4.0–5.6)

## 2024-06-18 NOTE — Patient Instructions (Addendum)
-   Continue Metformin  1000 mg twice daily  - Decrease Semglee  6 units once  daily  - Take Humalog  3 units with each meal  -Humalog  correctional insulin : ADD extra units on insulin  to your meal-time Humalog  dose if your blood sugars are higher than 190. Use the scale below to help guide you:   Blood sugar before meal Number of units to inject  Less than 190 0 unit  191- 250 1 units  251- 310 2 units  311 - 370 3 units  371 - 430 4 units  431 - 490 5 units    HOW TO TREAT LOW BLOOD SUGARS (Blood sugar LESS THAN 70 MG/DL) Please follow the RULE OF 15 for the treatment of hypoglycemia treatment (when your (blood sugars are less than 70 mg/dL)   STEP 1: Take 15 grams of carbohydrates when your blood sugar is low, which includes:  3-4 GLUCOSE TABS  OR 3-4 OZ OF JUICE OR REGULAR SODA OR ONE TUBE OF GLUCOSE GEL    STEP 2: RECHECK blood sugar in 15 MINUTES STEP 3: If your blood sugar is still low at the 15 minute recheck --> then, go back to STEP 1 and treat AGAIN with another 15 grams of carbohydrates.
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
## 2024-06-21 ENCOUNTER — Encounter: Payer: Self-pay | Admitting: Internal Medicine

## 2024-06-30 ENCOUNTER — Other Ambulatory Visit: Payer: Self-pay

## 2024-06-30 MED ORDER — FREESTYLE LIBRE 3 PLUS SENSOR MISC: 3 refills | Status: AC

## 2024-07-01 ENCOUNTER — Ambulatory Visit: Admitting: Internal Medicine

## 2024-07-05 ENCOUNTER — Encounter: Payer: Self-pay | Admitting: Internal Medicine

## 2024-07-06 ENCOUNTER — Other Ambulatory Visit: Payer: Self-pay | Admitting: Internal Medicine

## 2024-07-13 ENCOUNTER — Encounter: Payer: Self-pay | Admitting: Internal Medicine

## 2024-07-13 ENCOUNTER — Ambulatory Visit (INDEPENDENT_AMBULATORY_CARE_PROVIDER_SITE_OTHER): Admitting: Internal Medicine

## 2024-07-13 VITALS — BP 138/82 | HR 92 | Ht 60.0 in | Wt 113.0 lb

## 2024-07-13 DIAGNOSIS — Z794 Long term (current) use of insulin: Secondary | ICD-10-CM

## 2024-07-13 DIAGNOSIS — E1165 Type 2 diabetes mellitus with hyperglycemia: Secondary | ICD-10-CM | POA: Diagnosis not present

## 2024-07-13 DIAGNOSIS — E1129 Type 2 diabetes mellitus with other diabetic kidney complication: Secondary | ICD-10-CM | POA: Diagnosis not present

## 2024-07-13 DIAGNOSIS — R809 Proteinuria, unspecified: Secondary | ICD-10-CM | POA: Diagnosis not present

## 2024-07-13 MED ORDER — INSULIN GLARGINE-YFGN 100 UNIT/ML ~~LOC~~ SOPN: 10.0000 [IU] | PEN_INJECTOR | Freq: Every day | SUBCUTANEOUS | 3 refills | Status: AC

## 2024-07-13 MED ORDER — LYUMJEV KWIKPEN 100 UNIT/ML ~~LOC~~ SOPN: PEN_INJECTOR | SUBCUTANEOUS | 3 refills | Status: AC

## 2024-07-13 NOTE — Patient Instructions (Addendum)
-  Continue Metformin  1000 mg twice daily  -Increase Semglee  10 units once  daily  -Lyumjev  Correctional insulin :  Use the scale below to help guide you before each meal  Blood sugar before meal Number of units to inject  Less than 190 0 unit  191- 250 1 units  251- 310 2 units  311 - 370 3 units  371 - 430 4 units  431 - 490 5 units    HOW TO TREAT LOW BLOOD SUGARS (Blood sugar LESS THAN 70 MG/DL) Please follow the RULE OF 15 for the treatment of hypoglycemia treatment (when your (blood sugars are less than 70 mg/dL)   STEP 1: Take 15 grams of carbohydrates when your blood sugar is low, which includes:  3-4 GLUCOSE TABS  OR 3-4 OZ OF JUICE OR REGULAR SODA OR ONE TUBE OF GLUCOSE GEL    STEP 2: RECHECK blood sugar in 15 MINUTES STEP 3: If your blood sugar is still low at the 15 minute recheck --> then, go back to STEP 1 and treat AGAIN with another 15 grams of carbohydrates.
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
## 2024-07-14 ENCOUNTER — Telehealth: Payer: Self-pay

## 2024-07-14 NOTE — Telephone Encounter (Signed)
 Patient will stop by when she can to get help connecting the [facility].

## 2024-07-16 ENCOUNTER — Ambulatory Visit: Admitting: Internal Medicine

## 2024-07-19 ENCOUNTER — Encounter: Payer: Self-pay | Admitting: Radiology

## 2024-07-26 ENCOUNTER — Telehealth: Payer: Self-pay

## 2024-07-26 NOTE — Telephone Encounter (Signed)
Ok with me, thanks.

## 2024-07-26 NOTE — Telephone Encounter (Signed)
 [place] to change to me.

## 2024-07-26 NOTE — Telephone Encounter (Signed)
 Copied from CRM [id number]. Topic: Appointments - Transfer of Care >> Jul 26, 2024  8:34 AM Zy'onna H wrote: Pt is requesting to transfer FROM: Norleen Lynwood ORN, MD Pt is requesting to transfer TO: Frann Mabel SQUIBB Reason for requested transfer: Her current PCP will be retiring soon It is the responsibility of the team the patient would like to transfer to (Dr. Frann, Mabel SQUIBB) to reach out to the patient if for any reason this transfer is not acceptable.   **I already informed the patient that he is currently not taking new patients, she still requested I send this request over on her behalf**  Clinic - please advise patient of possible timeframe for [facility] accepting New patients.

## 2024-07-26 NOTE — Telephone Encounter (Signed)
Pt scheduled for next wednesday

## 2024-07-27 ENCOUNTER — Encounter: Payer: Self-pay | Admitting: Internal Medicine

## 2024-08-03 ENCOUNTER — Ambulatory Visit: Admitting: Internal Medicine

## 2024-08-03 ENCOUNTER — Encounter: Payer: Self-pay | Admitting: Internal Medicine

## 2024-08-03 VITALS — BP 142/80 | HR 73 | Temp 97.8°F | Ht 60.0 in | Wt 115.0 lb

## 2024-08-03 DIAGNOSIS — E7800 Pure hypercholesterolemia, unspecified: Secondary | ICD-10-CM | POA: Diagnosis not present

## 2024-08-03 DIAGNOSIS — Z794 Long term (current) use of insulin: Secondary | ICD-10-CM | POA: Diagnosis not present

## 2024-08-03 DIAGNOSIS — I10 Essential (primary) hypertension: Secondary | ICD-10-CM | POA: Diagnosis not present

## 2024-08-03 DIAGNOSIS — E1165 Type 2 diabetes mellitus with hyperglycemia: Secondary | ICD-10-CM | POA: Diagnosis not present

## 2024-08-03 DIAGNOSIS — G2581 Restless legs syndrome: Secondary | ICD-10-CM | POA: Diagnosis not present

## 2024-08-03 DIAGNOSIS — Z7984 Long term (current) use of oral hypoglycemic drugs: Secondary | ICD-10-CM

## 2024-08-03 DIAGNOSIS — E538 Deficiency of other specified B group vitamins: Secondary | ICD-10-CM | POA: Diagnosis not present

## 2024-08-03 MED ORDER — AMLODIPINE BESYLATE 5 MG PO TABS: 5.0000 mg | ORAL_TABLET | Freq: Every day | ORAL | 3 refills | Status: DC
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
## 2024-08-03 NOTE — Addendum Note (Signed)
 Addended by: NORLEEN LYNWOOD ORN on: 08/03/2024 09:39 AM   Modules accepted: Level of Service

## 2024-08-03 NOTE — Assessment & Plan Note (Signed)
Lab Results  Component Value Date   VITAMINB12 552 10/07/2022   Stable, cont oral replacement - b12 1000 mcg qd

## 2024-08-03 NOTE — Assessment & Plan Note (Signed)
 With worsening severity frequency recently - pt advised ok to try the klonopin  she has at home at bedtime prn

## 2024-08-03 NOTE — Patient Instructions (Signed)
 Ok to try the klonopin  you have at home for the restless leg symptoms at bedtime  Please take all new medication as prescribed -- amlodipine 5 mg per day for blood pressure  Please continue all other medications as before, including the lisinopril   Please have the pharmacy call with any other refills you may need.  Please continue your efforts at being more active, low cholesterol diet, and weight control.  Please keep your appointments with your specialists as you may have planned - Endocrinology soon  We can hold on lab testing today  Please make an Appointment to return in 6 months, or sooner if needed

## 2024-08-03 NOTE — Assessment & Plan Note (Addendum)
 BP Readings from Last 3 Encounters:  08/03/24 (!) 142/80  07/13/24 138/82  06/18/24 134/86   Uncontrolled, pt to continue medical treatment lisinopril  40 mg every day, and add amlodipine 5 mg qd

## 2024-08-03 NOTE — Assessment & Plan Note (Signed)
 Lab Results  Component Value Date   LDLCALC 61 03/17/2024   Stable, pt to continue current statin crestor  40 mg qd'

## 2024-08-03 NOTE — Assessment & Plan Note (Addendum)
 With insulin , with hyperglycemia  Lab Results  Component Value Date   HGBA1C 10.2 (A) 06/18/2024   Uncontrolled recently but now sugars largely under 200 in the past wk, pt to continue current medical treatment glucotroll xl 10 bid, semglee  10 u every day, lispro SSI, metformin  1000 bid; declines need for other change today, and has endo f/u soon

## 2024-08-04 ENCOUNTER — Other Ambulatory Visit (HOSPITAL_COMMUNITY): Payer: Self-pay

## 2024-08-04 ENCOUNTER — Ambulatory Visit: Payer: Self-pay | Admitting: Family Medicine

## 2024-08-04 ENCOUNTER — Encounter: Payer: Self-pay | Admitting: Family Medicine

## 2024-08-04 ENCOUNTER — Telehealth: Payer: Self-pay

## 2024-08-04 ENCOUNTER — Ambulatory Visit: Admitting: Family Medicine

## 2024-08-04 VITALS — BP 144/88 | HR 100 | Temp 98.0°F | Resp 16 | Ht 61.0 in | Wt 113.4 lb

## 2024-08-04 DIAGNOSIS — G2581 Restless legs syndrome: Secondary | ICD-10-CM | POA: Diagnosis not present

## 2024-08-04 DIAGNOSIS — F411 Generalized anxiety disorder: Secondary | ICD-10-CM

## 2024-08-04 DIAGNOSIS — E7800 Pure hypercholesterolemia, unspecified: Secondary | ICD-10-CM

## 2024-08-04 DIAGNOSIS — I10 Essential (primary) hypertension: Secondary | ICD-10-CM | POA: Diagnosis not present

## 2024-08-04 LAB — COMPREHENSIVE METABOLIC PANEL WITH GFR
ALT: 8 U/L (ref 0–35)
AST: 12 U/L (ref 0–37)
Albumin: 4.1 g/dL (ref 3.5–5.2)
Alkaline Phosphatase: 52 U/L (ref 39–117)
BUN: 10 mg/dL (ref 6–23)
CO2: 30 meq/L (ref 19–32)
Calcium: 9.2 mg/dL (ref 8.4–10.5)
Chloride: 103 meq/L (ref 96–112)
Creatinine, Ser: 0.81 mg/dL (ref 0.40–1.20)
GFR: 74.57 mL/min (ref >60.00)
Glucose, Bld: 165 mg/dL — ABNORMAL HIGH (ref 70–99)
Potassium: 3.7 meq/L (ref 3.5–5.1)
Sodium: 140 meq/L (ref 135–145)
Total Bilirubin: 0.7 mg/dL (ref 0.2–1.2)
Total Protein: 6.8 g/dL (ref 6.0–8.3)

## 2024-08-04 LAB — LIPID PANEL
Cholesterol: 136 mg/dL (ref 0–200)
HDL: 69.8 mg/dL (ref >39.00)
LDL Cholesterol: 49 mg/dL (ref 0–99)
NonHDL: 66.12
Total CHOL/HDL Ratio: 2
Triglycerides: 88 mg/dL (ref 0.0–149.0)
VLDL: 17.6 mg/dL (ref 0.0–40.0)

## 2024-08-04 MED ORDER — BUSPIRONE HCL 7.5 MG PO TABS: 7.5000 mg | ORAL_TABLET | Freq: Two times a day (BID) | ORAL | 1 refills | Status: DC

## 2024-08-04 MED ORDER — AMLODIPINE BESYLATE 10 MG PO TABS: 10.0000 mg | ORAL_TABLET | Freq: Every day | ORAL | 0 refills | Status: DC

## 2024-08-04 MED ORDER — PRAMIPEXOLE DIHYDROCHLORIDE 0.25 MG PO TABS: 0.2500 mg | ORAL_TABLET | Freq: Every day | ORAL | 1 refills | Status: DC

## 2024-08-04 NOTE — Telephone Encounter (Signed)
 Pharmacy Patient Advocate Encounter   Received notification from CoverMyMeds that prior authorization for Buspirone 7.5mg  tabs is required/requested.   Insurance verification completed.   The patient is insured through [facility].   Per test claim:  Buspirone 5mg , 10mg  or 15mg  is preferred by the insurance.  If suggested medication is appropriate, Please send in a new RX and discontinue this one. If not, please advise as to why it's not appropriate so that we may request a Prior Authorization. Please note, some preferred medications may still require a PA.  If the suggested medications have not been trialed and there are no contraindications to their use, the PA will not be submitted, as it will not be approved.   Tried a test claim for Buspirone 15mg  to take 1/2 tablet by mouth twice daily #30 and the copay came back as $0.   Would you like to change to the Buspirone 15mg ?  CMM KEY: [facility]

## 2024-08-04 NOTE — Patient Instructions (Addendum)
Keep the diet clean and stay active.  Give [place] 2-3 business days to get the results of your labs back.   Claritin (loratadine), Allegra (fexofenadine), Zyrtec (cetirizine) which is also equivalent to Xyzal (levocetirizine); these are listed in order from weakest to strongest. Generic, and therefore cheaper, options are in the parentheses.   Flonase (fluticasone); nasal spray that is over the counter. 2 sprays each nostril, once daily. Aim towards the same side eye when you spray.  There are available OTC, and the generic versions, which may be cheaper, are in parentheses. Show this to a pharmacist if you have trouble finding any of these items.  Let us know if you need anything.
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
## 2024-08-05 ENCOUNTER — Other Ambulatory Visit (HOSPITAL_COMMUNITY): Payer: Self-pay

## 2024-08-05 MED ORDER — BUSPIRONE HCL 5 MG PO TABS: 5.0000 mg | ORAL_TABLET | Freq: Two times a day (BID) | ORAL | 1 refills | Status: AC

## 2024-08-05 NOTE — Telephone Encounter (Signed)
 Rx sent.

## 2024-08-05 NOTE — Telephone Encounter (Signed)
 Per the test claim, the Buspirone 7.5mg  is not covered and is a plan exclusion. Says to use Buspirone 5mg , 10mg , or 15mg  tabs.

## 2024-08-05 NOTE — Telephone Encounter (Signed)
 Plz replace with 5 mg and sig should be 5 mg PO bid. Thx.

## 2024-08-15 ENCOUNTER — Encounter: Payer: Self-pay | Admitting: Family Medicine

## 2024-08-19 ENCOUNTER — Other Ambulatory Visit: Payer: Self-pay | Admitting: Internal Medicine

## 2024-08-20 ENCOUNTER — Other Ambulatory Visit: Payer: Self-pay

## 2024-09-02 NOTE — Telephone Encounter (Signed)
 Patient called stating that she had 3 faulty Libre 3 Plus sensors.  She has called the [employer] but did not understand the person.  She tried to put information in online but was unable to do so. Discussed that she needs to call Her[facility] back and be persistent.  They should replace faulty sensors.  Will leave leave Libre 3 plus sensor at the front desk for patient to pick up. Lot [id number], Expiration 02/13/2025  Leita Constable, RD, LDN, CDCES, DipACLM

## 2024-09-03 ENCOUNTER — Other Ambulatory Visit: Payer: Self-pay | Admitting: Family Medicine

## 2024-09-03 ENCOUNTER — Encounter: Payer: Self-pay | Admitting: Family Medicine

## 2024-09-03 ENCOUNTER — Ambulatory Visit: Admitting: Family Medicine

## 2024-09-03 ENCOUNTER — Other Ambulatory Visit: Payer: Self-pay | Admitting: Internal Medicine

## 2024-09-03 VITALS — BP 126/74 | HR 100 | Temp 98.0°F | Resp 16 | Ht 61.0 in | Wt 111.4 lb

## 2024-09-03 DIAGNOSIS — F411 Generalized anxiety disorder: Secondary | ICD-10-CM

## 2024-09-03 DIAGNOSIS — I10 Essential (primary) hypertension: Secondary | ICD-10-CM

## 2024-09-03 DIAGNOSIS — K253 Acute gastric ulcer without hemorrhage or perforation: Secondary | ICD-10-CM

## 2024-09-03 MED ORDER — ROPINIROLE HCL 3 MG PO TABS: 3.0000 mg | ORAL_TABLET | Freq: Every day | ORAL | 3 refills | Status: AC
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
## 2024-09-03 NOTE — Patient Instructions (Addendum)
 Keep the diet clean and stay active.  Let us know if you need anything.

## 2024-10-05 ENCOUNTER — Encounter: Payer: Self-pay | Admitting: Internal Medicine

## 2024-10-05 ENCOUNTER — Ambulatory Visit (INDEPENDENT_AMBULATORY_CARE_PROVIDER_SITE_OTHER): Admitting: Internal Medicine

## 2024-10-05 VITALS — BP 162/90 | Ht 61.0 in | Wt 108.0 lb

## 2024-10-05 DIAGNOSIS — E785 Hyperlipidemia, unspecified: Secondary | ICD-10-CM

## 2024-10-05 DIAGNOSIS — E1165 Type 2 diabetes mellitus with hyperglycemia: Secondary | ICD-10-CM | POA: Diagnosis not present

## 2024-10-05 DIAGNOSIS — R809 Proteinuria, unspecified: Secondary | ICD-10-CM | POA: Diagnosis not present

## 2024-10-05 DIAGNOSIS — Z794 Long term (current) use of insulin: Secondary | ICD-10-CM

## 2024-10-05 DIAGNOSIS — E1129 Type 2 diabetes mellitus with other diabetic kidney complication: Secondary | ICD-10-CM

## 2024-10-05 LAB — POCT GLYCOSYLATED HEMOGLOBIN (HGB A1C): Hemoglobin A1C: 9.6 % — AB (ref 4.0–5.6)
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
## 2024-10-05 NOTE — Patient Instructions (Signed)
-  Continue Metformin  1000 mg twice daily  -Increase Semglee  10 units once  daily  -Lyumjev  Correctional insulin :  Use the scale below to help guide you before each meal  Blood sugar before meal Number of units to inject  Less than 190 0 unit  191- 250 1 units  251- 310 2 units  311 - 370 3 units  371 - 430 4 units  431 - 490 5 units    HOW TO TREAT LOW BLOOD SUGARS (Blood sugar LESS THAN 70 MG/DL) Please follow the RULE OF 15 for the treatment of hypoglycemia treatment (when your (blood sugars are less than 70 mg/dL)   STEP 1: Take 15 grams of carbohydrates when your blood sugar is low, which includes:  3-4 GLUCOSE TABS  OR 3-4 OZ OF JUICE OR REGULAR SODA OR ONE TUBE OF GLUCOSE GEL    STEP 2: RECHECK blood sugar in 15 MINUTES STEP 3: If your blood sugar is still low at the 15 minute recheck --> then, go back to STEP 1 and treat AGAIN with another 15 grams of carbohydrates.

## 2024-10-18 ENCOUNTER — Other Ambulatory Visit: Payer: Self-pay | Admitting: Family Medicine

## 2025-01-03 ENCOUNTER — Ambulatory Visit: Admitting: Internal Medicine

## 2025-03-04 ENCOUNTER — Ambulatory Visit: Admitting: Family Medicine
